# Patient Record
Sex: Male | Born: 1950
Health system: Southern US, Community
[De-identification: ages and names within clinical notes are randomized; demographics above are authoritative.]

## PROBLEM LIST (undated history)

## (undated) DIAGNOSIS — G935 Compression of brain: Secondary | ICD-10-CM

## (undated) DIAGNOSIS — Z973 Presence of spectacles and contact lenses: Secondary | ICD-10-CM

## (undated) DIAGNOSIS — I719 Aortic aneurysm of unspecified site, without rupture: Secondary | ICD-10-CM

## (undated) DIAGNOSIS — E039 Hypothyroidism, unspecified: Secondary | ICD-10-CM

## (undated) DIAGNOSIS — C801 Malignant (primary) neoplasm, unspecified: Secondary | ICD-10-CM

## (undated) DIAGNOSIS — T7840XA Allergy, unspecified, initial encounter: Secondary | ICD-10-CM

## (undated) DIAGNOSIS — G709 Myoneural disorder, unspecified: Secondary | ICD-10-CM

## (undated) DIAGNOSIS — R1310 Dysphagia, unspecified: Secondary | ICD-10-CM

## (undated) DIAGNOSIS — T884XXA Failed or difficult intubation, initial encounter: Secondary | ICD-10-CM

## (undated) DIAGNOSIS — H269 Unspecified cataract: Secondary | ICD-10-CM

## (undated) DIAGNOSIS — I10 Essential (primary) hypertension: Secondary | ICD-10-CM

## (undated) DIAGNOSIS — M1990 Unspecified osteoarthritis, unspecified site: Secondary | ICD-10-CM

## (undated) DIAGNOSIS — E785 Hyperlipidemia, unspecified: Secondary | ICD-10-CM

## (undated) DIAGNOSIS — E079 Disorder of thyroid, unspecified: Secondary | ICD-10-CM

## (undated) DIAGNOSIS — I509 Heart failure, unspecified: Secondary | ICD-10-CM

## (undated) DIAGNOSIS — Z8489 Family history of other specified conditions: Secondary | ICD-10-CM

## (undated) DIAGNOSIS — K219 Gastro-esophageal reflux disease without esophagitis: Secondary | ICD-10-CM

## (undated) DIAGNOSIS — N189 Chronic kidney disease, unspecified: Secondary | ICD-10-CM

## (undated) HISTORY — PX: EYE SURGERY: SHX253

## (undated) HISTORY — DX: Gastro-esophageal reflux disease without esophagitis: K21.9

## (undated) HISTORY — DX: Compression of brain: G93.5

## (undated) HISTORY — PX: OTHER SURGICAL HISTORY: SHX169

## (undated) HISTORY — PX: TOTAL HIP ARTHROPLASTY: SHX124

## (undated) HISTORY — DX: Aortic aneurysm of unspecified site, without rupture: I71.9

## (undated) HISTORY — PX: INGUINAL HERNIA REPAIR: SHX194

## (undated) HISTORY — DX: Essential (primary) hypertension: I10

## (undated) HISTORY — DX: Myoneural disorder, unspecified: G70.9

## (undated) HISTORY — PX: HERNIA REPAIR: SHX51

## (undated) HISTORY — DX: Unspecified cataract: H26.9

## (undated) HISTORY — PX: CATARACT EXTRACTION: SUR2

## (undated) HISTORY — DX: Disorder of thyroid, unspecified: E07.9

## (undated) HISTORY — DX: Hyperlipidemia, unspecified: E78.5

## (undated) HISTORY — PX: SPINE SURGERY: SHX786

## (undated) HISTORY — PX: LUMBAR LAMINECTOMY: SHX95

## (undated) HISTORY — DX: Allergy, unspecified, initial encounter: T78.40XA

---

## 1998-07-27 ENCOUNTER — Ambulatory Visit (HOSPITAL_COMMUNITY): Admission: RE | Admit: 1998-07-27 | Discharge: 1998-07-27 | Payer: Self-pay | Admitting: Internal Medicine

## 1998-07-27 ENCOUNTER — Encounter: Payer: Self-pay | Admitting: Internal Medicine

## 1998-08-18 ENCOUNTER — Ambulatory Visit (HOSPITAL_COMMUNITY): Admission: RE | Admit: 1998-08-18 | Discharge: 1998-08-18 | Payer: Self-pay | Admitting: Cardiovascular Disease

## 2001-07-31 LAB — HM COLONOSCOPY: HM Colonoscopy: NORMAL

## 2002-02-12 ENCOUNTER — Emergency Department (HOSPITAL_COMMUNITY): Admission: EM | Admit: 2002-02-12 | Discharge: 2002-02-12 | Payer: Self-pay | Admitting: Emergency Medicine

## 2002-02-13 ENCOUNTER — Emergency Department (HOSPITAL_COMMUNITY): Admission: EM | Admit: 2002-02-13 | Discharge: 2002-02-13 | Payer: Self-pay | Admitting: Emergency Medicine

## 2002-02-14 ENCOUNTER — Emergency Department (HOSPITAL_COMMUNITY): Admission: EM | Admit: 2002-02-14 | Discharge: 2002-02-14 | Payer: Self-pay | Admitting: Emergency Medicine

## 2005-04-25 ENCOUNTER — Ambulatory Visit: Payer: Self-pay | Admitting: Internal Medicine

## 2005-05-01 ENCOUNTER — Ambulatory Visit: Payer: Self-pay | Admitting: Internal Medicine

## 2007-02-11 ENCOUNTER — Encounter: Payer: Self-pay | Admitting: Internal Medicine

## 2007-02-11 DIAGNOSIS — Z87448 Personal history of other diseases of urinary system: Secondary | ICD-10-CM | POA: Insufficient documentation

## 2007-02-11 DIAGNOSIS — L509 Urticaria, unspecified: Secondary | ICD-10-CM | POA: Insufficient documentation

## 2007-02-11 DIAGNOSIS — K219 Gastro-esophageal reflux disease without esophagitis: Secondary | ICD-10-CM | POA: Insufficient documentation

## 2007-12-03 ENCOUNTER — Ambulatory Visit: Payer: Self-pay | Admitting: Internal Medicine

## 2007-12-03 LAB — CONVERTED CEMR LAB
ALT: 26 units/L (ref 0–53)
Albumin: 4 g/dL (ref 3.5–5.2)
BUN: 30 mg/dL — ABNORMAL HIGH (ref 6–23)
Basophils Absolute: 0.1 10*3/uL (ref 0.0–0.1)
Basophils Relative: 1.2 % (ref 0.0–3.0)
Bilirubin, Direct: 0.1 mg/dL (ref 0.0–0.3)
Calcium: 9.1 mg/dL (ref 8.4–10.5)
Chloride: 109 meq/L (ref 96–112)
Direct LDL: 147.7 mg/dL
Eosinophils Relative: 1.1 % (ref 0.0–5.0)
GFR calc non Af Amer: 107 mL/min
HCT: 45.2 % (ref 39.0–52.0)
Hemoglobin, Urine: NEGATIVE
Hemoglobin: 15.9 g/dL (ref 13.0–17.0)
Ketones, ur: NEGATIVE mg/dL
Lymphocytes Relative: 30.9 % (ref 12.0–46.0)
MCV: 93.5 fL (ref 78.0–100.0)
Monocytes Relative: 9.3 % (ref 3.0–12.0)
Neutrophils Relative %: 57.5 % (ref 43.0–77.0)
Nitrite: NEGATIVE
Platelets: 245 10*3/uL (ref 150–400)
Potassium: 4 meq/L (ref 3.5–5.1)
RBC: 4.84 M/uL (ref 4.22–5.81)
RDW: 13.1 % (ref 11.5–14.6)
TSH: 1.69 microintl units/mL (ref 0.35–5.50)
Total Bilirubin: 0.7 mg/dL (ref 0.3–1.2)
Total CHOL/HDL Ratio: 5.5
Total Protein, Urine: NEGATIVE mg/dL
Total Protein: 6.9 g/dL (ref 6.0–8.3)
Triglycerides: 84 mg/dL (ref 0–149)
Urine Glucose: NEGATIVE mg/dL
Urobilinogen, UA: 0.2 (ref 0.0–1.0)
VLDL: 17 mg/dL (ref 0–40)

## 2007-12-06 ENCOUNTER — Ambulatory Visit: Payer: Self-pay | Admitting: Internal Medicine

## 2008-02-21 ENCOUNTER — Encounter: Payer: Self-pay | Admitting: Internal Medicine

## 2008-05-21 ENCOUNTER — Ambulatory Visit: Payer: Self-pay | Admitting: Internal Medicine

## 2008-05-21 DIAGNOSIS — R109 Unspecified abdominal pain: Secondary | ICD-10-CM | POA: Insufficient documentation

## 2008-05-22 ENCOUNTER — Telehealth: Payer: Self-pay | Admitting: Internal Medicine

## 2008-06-15 ENCOUNTER — Telehealth: Payer: Self-pay | Admitting: Internal Medicine

## 2008-06-17 ENCOUNTER — Telehealth: Payer: Self-pay | Admitting: Internal Medicine

## 2008-06-25 ENCOUNTER — Encounter: Admission: RE | Admit: 2008-06-25 | Discharge: 2008-06-25 | Payer: Self-pay | Admitting: Internal Medicine

## 2008-06-29 ENCOUNTER — Telehealth: Payer: Self-pay | Admitting: Internal Medicine

## 2008-06-30 ENCOUNTER — Ambulatory Visit: Payer: Self-pay | Admitting: Internal Medicine

## 2008-06-30 DIAGNOSIS — M87059 Idiopathic aseptic necrosis of unspecified femur: Secondary | ICD-10-CM | POA: Insufficient documentation

## 2008-07-14 ENCOUNTER — Telehealth: Payer: Self-pay | Admitting: Internal Medicine

## 2008-07-27 ENCOUNTER — Telehealth: Payer: Self-pay | Admitting: Internal Medicine

## 2008-07-30 ENCOUNTER — Encounter: Admission: RE | Admit: 2008-07-30 | Discharge: 2008-07-30 | Payer: Self-pay | Admitting: Orthopedic Surgery

## 2008-09-01 ENCOUNTER — Inpatient Hospital Stay (HOSPITAL_COMMUNITY): Admission: RE | Admit: 2008-09-01 | Discharge: 2008-09-03 | Payer: Self-pay | Admitting: Orthopedic Surgery

## 2009-03-01 ENCOUNTER — Encounter: Admission: RE | Admit: 2009-03-01 | Discharge: 2009-03-01 | Payer: Self-pay | Admitting: Oral Surgery

## 2009-03-09 ENCOUNTER — Other Ambulatory Visit: Admission: RE | Admit: 2009-03-09 | Discharge: 2009-03-09 | Payer: Self-pay | Admitting: Otolaryngology

## 2009-04-07 ENCOUNTER — Encounter: Admission: RE | Admit: 2009-04-07 | Discharge: 2009-04-07 | Payer: Self-pay | Admitting: Otolaryngology

## 2009-04-07 ENCOUNTER — Ambulatory Visit: Payer: Self-pay | Admitting: Oncology

## 2009-04-12 ENCOUNTER — Ambulatory Visit: Admission: RE | Admit: 2009-04-12 | Discharge: 2009-07-09 | Payer: Self-pay | Admitting: Radiation Oncology

## 2009-04-12 ENCOUNTER — Ambulatory Visit (HOSPITAL_COMMUNITY): Admission: RE | Admit: 2009-04-12 | Discharge: 2009-04-12 | Payer: Self-pay | Admitting: Otolaryngology

## 2009-04-13 ENCOUNTER — Ambulatory Visit: Payer: Self-pay | Admitting: Internal Medicine

## 2009-04-13 LAB — COMPREHENSIVE METABOLIC PANEL
ALT: 10 U/L (ref 0–53)
Albumin: 3.6 g/dL (ref 3.5–5.2)
Alkaline Phosphatase: 111 U/L (ref 39–117)
BUN: 19 mg/dL (ref 6–23)
CO2: 23 mEq/L (ref 19–32)
Glucose, Bld: 100 mg/dL — ABNORMAL HIGH (ref 70–99)
Potassium: 3.8 mEq/L (ref 3.5–5.3)

## 2009-04-13 LAB — CBC WITH DIFFERENTIAL/PLATELET
BASO%: 0.3 % (ref 0.0–2.0)
EOS%: 0.3 % (ref 0.0–7.0)
Eosinophils Absolute: 0 10*3/uL (ref 0.0–0.5)
HGB: 12.5 g/dL — ABNORMAL LOW (ref 13.0–17.1)
LYMPH%: 13.5 % — ABNORMAL LOW (ref 14.0–49.0)
MCV: 86.9 fL (ref 79.3–98.0)
MONO%: 5.6 % (ref 0.0–14.0)
NEUT#: 10.1 10*3/uL — ABNORMAL HIGH (ref 1.5–6.5)
RDW: 14.2 % (ref 11.0–14.6)
WBC: 12.6 10*3/uL — ABNORMAL HIGH (ref 4.0–10.3)
lymph#: 1.7 10*3/uL (ref 0.9–3.3)

## 2009-04-15 ENCOUNTER — Encounter: Admission: AD | Admit: 2009-04-15 | Discharge: 2009-04-15 | Payer: Self-pay | Admitting: Dentistry

## 2009-04-15 ENCOUNTER — Ambulatory Visit: Payer: Self-pay | Admitting: Dentistry

## 2009-04-21 ENCOUNTER — Encounter: Admission: RE | Admit: 2009-04-21 | Discharge: 2009-07-20 | Payer: Self-pay | Admitting: Radiation Oncology

## 2009-04-30 ENCOUNTER — Ambulatory Visit (HOSPITAL_COMMUNITY): Admission: RE | Admit: 2009-04-30 | Discharge: 2009-04-30 | Payer: Self-pay | Admitting: Radiation Oncology

## 2009-05-04 ENCOUNTER — Encounter: Payer: Self-pay | Admitting: Internal Medicine

## 2009-05-04 LAB — COMPREHENSIVE METABOLIC PANEL
Albumin: 2.7 g/dL — ABNORMAL LOW (ref 3.5–5.2)
Alkaline Phosphatase: 86 U/L (ref 39–117)
BUN: 16 mg/dL (ref 6–23)
CO2: 25 mEq/L (ref 19–32)
Calcium: 9.2 mg/dL (ref 8.4–10.5)
Chloride: 101 mEq/L (ref 96–112)
Glucose, Bld: 107 mg/dL — ABNORMAL HIGH (ref 70–99)
Potassium: 4 mEq/L (ref 3.5–5.3)
Sodium: 135 mEq/L (ref 135–145)
Total Protein: 7.2 g/dL (ref 6.0–8.3)

## 2009-05-04 LAB — CBC WITH DIFFERENTIAL/PLATELET
Basophils Absolute: 0 10*3/uL (ref 0.0–0.1)
Eosinophils Absolute: 0.1 10*3/uL (ref 0.0–0.5)
HCT: 32.2 % — ABNORMAL LOW (ref 38.4–49.9)
HGB: 10.8 g/dL — ABNORMAL LOW (ref 13.0–17.1)
MCH: 28 pg (ref 27.2–33.4)
MONO#: 1.4 10*3/uL — ABNORMAL HIGH (ref 0.1–0.9)
NEUT#: 14.2 10*3/uL — ABNORMAL HIGH (ref 1.5–6.5)
NEUT%: 79.6 % — ABNORMAL HIGH (ref 39.0–75.0)
RDW: 14.3 % (ref 11.0–14.6)
lymph#: 2.1 10*3/uL (ref 0.9–3.3)

## 2009-05-06 ENCOUNTER — Emergency Department (HOSPITAL_COMMUNITY): Admission: EM | Admit: 2009-05-06 | Discharge: 2009-05-06 | Payer: Self-pay | Admitting: Emergency Medicine

## 2009-05-07 ENCOUNTER — Ambulatory Visit: Payer: Self-pay | Admitting: Oncology

## 2009-05-11 ENCOUNTER — Ambulatory Visit: Payer: Self-pay | Admitting: Oncology

## 2009-05-11 ENCOUNTER — Inpatient Hospital Stay (HOSPITAL_COMMUNITY): Admission: AD | Admit: 2009-05-11 | Discharge: 2009-05-17 | Payer: Self-pay | Admitting: Oncology

## 2009-05-11 ENCOUNTER — Encounter: Payer: Self-pay | Admitting: Internal Medicine

## 2009-05-11 LAB — CBC WITH DIFFERENTIAL/PLATELET
Basophils Absolute: 0.1 10*3/uL (ref 0.0–0.1)
HCT: 38.1 % — ABNORMAL LOW (ref 38.4–49.9)
HGB: 13.6 g/dL (ref 13.0–17.1)
LYMPH%: 3.9 % — ABNORMAL LOW (ref 14.0–49.0)
MCH: 27.9 pg (ref 27.2–33.4)
MONO#: 0.9 10*3/uL (ref 0.1–0.9)
NEUT%: 91.5 % — ABNORMAL HIGH (ref 39.0–75.0)
Platelets: 583 10*3/uL — ABNORMAL HIGH (ref 140–400)
WBC: 20.7 10*3/uL — ABNORMAL HIGH (ref 4.0–10.3)
lymph#: 0.8 10*3/uL — ABNORMAL LOW (ref 0.9–3.3)

## 2009-05-11 LAB — COMPREHENSIVE METABOLIC PANEL
ALT: 30 U/L (ref 0–53)
AST: 24 U/L (ref 0–37)
Alkaline Phosphatase: 91 U/L (ref 39–117)
CO2: 26 mEq/L (ref 19–32)
Potassium: 3 mEq/L — ABNORMAL LOW (ref 3.5–5.3)
Total Bilirubin: 0.7 mg/dL (ref 0.3–1.2)
Total Protein: 7.6 g/dL (ref 6.0–8.3)

## 2009-05-15 ENCOUNTER — Ambulatory Visit: Payer: Self-pay | Admitting: Hematology & Oncology

## 2009-05-16 ENCOUNTER — Ambulatory Visit: Payer: Self-pay | Admitting: Internal Medicine

## 2009-05-19 LAB — CBC WITH DIFFERENTIAL/PLATELET
BASO%: 1.2 % (ref 0.0–2.0)
HCT: 27 % — ABNORMAL LOW (ref 38.4–49.9)
LYMPH%: 14.5 % (ref 14.0–49.0)
MCHC: 34.1 g/dL (ref 32.0–36.0)
MCV: 84.9 fL (ref 79.3–98.0)
MONO%: 6.9 % (ref 0.0–14.0)
NEUT%: 75.1 % — ABNORMAL HIGH (ref 39.0–75.0)
Platelets: 278 10*3/uL (ref 140–400)
RBC: 3.18 10*6/uL — ABNORMAL LOW (ref 4.20–5.82)

## 2009-05-19 LAB — COMPREHENSIVE METABOLIC PANEL
ALT: 14 U/L (ref 0–53)
Alkaline Phosphatase: 70 U/L (ref 39–117)
Creatinine, Ser: 2.57 mg/dL — ABNORMAL HIGH (ref 0.40–1.50)
Glucose, Bld: 76 mg/dL (ref 70–99)
Sodium: 139 mEq/L (ref 135–145)
Total Bilirubin: 0.5 mg/dL (ref 0.3–1.2)
Total Protein: 6.4 g/dL (ref 6.0–8.3)

## 2009-05-19 LAB — URIC ACID: Uric Acid, Serum: 5 mg/dL (ref 4.0–7.8)

## 2009-05-26 ENCOUNTER — Encounter: Payer: Self-pay | Admitting: Internal Medicine

## 2009-05-26 LAB — COMPREHENSIVE METABOLIC PANEL
Albumin: 3.4 g/dL — ABNORMAL LOW (ref 3.5–5.2)
Alkaline Phosphatase: 97 U/L (ref 39–117)
BUN: 24 mg/dL — ABNORMAL HIGH (ref 6–23)
CO2: 26 mEq/L (ref 19–32)
Calcium: 9.7 mg/dL (ref 8.4–10.5)
Chloride: 101 mEq/L (ref 96–112)
Glucose, Bld: 89 mg/dL (ref 70–99)
Potassium: 5.2 mEq/L (ref 3.5–5.3)
Sodium: 136 mEq/L (ref 135–145)
Total Protein: 7.7 g/dL (ref 6.0–8.3)

## 2009-05-26 LAB — CBC WITH DIFFERENTIAL/PLATELET
Basophils Absolute: 0 10*3/uL (ref 0.0–0.1)
EOS%: 7.4 % — ABNORMAL HIGH (ref 0.0–7.0)
Eosinophils Absolute: 0.2 10*3/uL (ref 0.0–0.5)
HCT: 33 % — ABNORMAL LOW (ref 38.4–49.9)
HGB: 10.9 g/dL — ABNORMAL LOW (ref 13.0–17.1)
MCH: 27.5 pg (ref 27.2–33.4)
NEUT#: 1.9 10*3/uL (ref 1.5–6.5)
NEUT%: 60.9 % (ref 39.0–75.0)
RDW: 14.6 % (ref 11.0–14.6)
lymph#: 0.7 10*3/uL — ABNORMAL LOW (ref 0.9–3.3)

## 2009-05-31 LAB — COMPREHENSIVE METABOLIC PANEL
Albumin: 4 g/dL (ref 3.5–5.2)
Alkaline Phosphatase: 84 U/L (ref 39–117)
BUN: 26 mg/dL — ABNORMAL HIGH (ref 6–23)
Glucose, Bld: 97 mg/dL (ref 70–99)
Potassium: 4 mEq/L (ref 3.5–5.3)
Total Bilirubin: 0.3 mg/dL (ref 0.3–1.2)

## 2009-05-31 LAB — CBC WITH DIFFERENTIAL/PLATELET
Basophils Absolute: 0 10*3/uL (ref 0.0–0.1)
Eosinophils Absolute: 0.2 10*3/uL (ref 0.0–0.5)
HGB: 10.6 g/dL — ABNORMAL LOW (ref 13.0–17.1)
LYMPH%: 10.7 % — ABNORMAL LOW (ref 14.0–49.0)
MCV: 83.5 fL (ref 79.3–98.0)
MONO%: 9.7 % (ref 0.0–14.0)
NEUT#: 2.5 10*3/uL (ref 1.5–6.5)
Platelets: 385 10*3/uL (ref 140–400)

## 2009-05-31 LAB — MAGNESIUM: Magnesium: 1.3 mg/dL — ABNORMAL LOW (ref 1.5–2.5)

## 2009-06-02 ENCOUNTER — Encounter: Payer: Self-pay | Admitting: Internal Medicine

## 2009-06-02 LAB — BASIC METABOLIC PANEL
Glucose, Bld: 85 mg/dL (ref 70–99)
Potassium: 4.4 mEq/L (ref 3.5–5.3)
Sodium: 134 mEq/L — ABNORMAL LOW (ref 135–145)

## 2009-06-02 LAB — MAGNESIUM: Magnesium: 1.4 mg/dL — ABNORMAL LOW (ref 1.5–2.5)

## 2009-06-09 ENCOUNTER — Encounter: Payer: Self-pay | Admitting: Internal Medicine

## 2009-06-09 ENCOUNTER — Ambulatory Visit: Payer: Self-pay | Admitting: Oncology

## 2009-06-09 LAB — CBC WITH DIFFERENTIAL/PLATELET
Basophils Absolute: 0 10*3/uL (ref 0.0–0.1)
Eosinophils Absolute: 0 10*3/uL (ref 0.0–0.5)
HGB: 9.6 g/dL — ABNORMAL LOW (ref 13.0–17.1)
NEUT#: 2.9 10*3/uL (ref 1.5–6.5)
RDW: 15.4 % — ABNORMAL HIGH (ref 11.0–14.6)
lymph#: 0.3 10*3/uL — ABNORMAL LOW (ref 0.9–3.3)

## 2009-06-09 LAB — COMPREHENSIVE METABOLIC PANEL
Albumin: 4 g/dL (ref 3.5–5.2)
BUN: 26 mg/dL — ABNORMAL HIGH (ref 6–23)
Calcium: 9.5 mg/dL (ref 8.4–10.5)
Chloride: 92 mEq/L — ABNORMAL LOW (ref 96–112)
Glucose, Bld: 85 mg/dL (ref 70–99)
Potassium: 4.6 mEq/L (ref 3.5–5.3)

## 2009-06-16 ENCOUNTER — Encounter: Payer: Self-pay | Admitting: Internal Medicine

## 2009-06-16 LAB — CBC WITH DIFFERENTIAL/PLATELET
BASO%: 0.4 % (ref 0.0–2.0)
Eosinophils Absolute: 0.1 10*3/uL (ref 0.0–0.5)
MCV: 84.3 fL (ref 79.3–98.0)
MONO#: 0.8 10*3/uL (ref 0.1–0.9)
MONO%: 18.8 % — ABNORMAL HIGH (ref 0.0–14.0)
NEUT#: 3.1 10*3/uL (ref 1.5–6.5)
RBC: 2.92 10*6/uL — ABNORMAL LOW (ref 4.20–5.82)
RDW: 17.2 % — ABNORMAL HIGH (ref 11.0–14.6)
WBC: 4.2 10*3/uL (ref 4.0–10.3)

## 2009-06-16 LAB — COMPREHENSIVE METABOLIC PANEL
ALT: 21 U/L (ref 0–53)
AST: 20 U/L (ref 0–37)
Albumin: 2.9 g/dL — ABNORMAL LOW (ref 3.5–5.2)
Alkaline Phosphatase: 97 U/L (ref 39–117)
Glucose, Bld: 81 mg/dL (ref 70–99)
Potassium: 4 mEq/L (ref 3.5–5.3)
Sodium: 136 mEq/L (ref 135–145)
Total Protein: 6.2 g/dL (ref 6.0–8.3)

## 2009-06-22 LAB — COMPREHENSIVE METABOLIC PANEL
ALT: 25 U/L (ref 0–53)
AST: 19 U/L (ref 0–37)
BUN: 22 mg/dL (ref 6–23)
Calcium: 8.9 mg/dL (ref 8.4–10.5)
Chloride: 95 mEq/L — ABNORMAL LOW (ref 96–112)
Creatinine, Ser: 0.76 mg/dL (ref 0.40–1.50)
Total Bilirubin: 0.3 mg/dL (ref 0.3–1.2)

## 2009-06-22 LAB — CBC WITH DIFFERENTIAL/PLATELET
BASO%: 1.2 % (ref 0.0–2.0)
Basophils Absolute: 0 10*3/uL (ref 0.0–0.1)
EOS%: 3 % (ref 0.0–7.0)
HCT: 23.8 % — ABNORMAL LOW (ref 38.4–49.9)
HGB: 8.4 g/dL — ABNORMAL LOW (ref 13.0–17.1)
LYMPH%: 7.8 % — ABNORMAL LOW (ref 14.0–49.0)
MCH: 30.6 pg (ref 27.2–33.4)
MCHC: 35.5 g/dL (ref 32.0–36.0)
MCV: 86.2 fL (ref 79.3–98.0)
NEUT%: 77.5 % — ABNORMAL HIGH (ref 39.0–75.0)
Platelets: 265 10*3/uL (ref 140–400)
lymph#: 0.2 10*3/uL — ABNORMAL LOW (ref 0.9–3.3)

## 2009-06-23 ENCOUNTER — Encounter: Payer: Self-pay | Admitting: Internal Medicine

## 2009-07-19 ENCOUNTER — Ambulatory Visit: Payer: Self-pay | Admitting: Oncology

## 2009-07-19 ENCOUNTER — Ambulatory Visit (HOSPITAL_COMMUNITY): Admission: RE | Admit: 2009-07-19 | Discharge: 2009-07-19 | Payer: Self-pay | Admitting: Oncology

## 2009-07-21 ENCOUNTER — Encounter: Payer: Self-pay | Admitting: Internal Medicine

## 2009-07-21 LAB — COMPREHENSIVE METABOLIC PANEL
ALT: 19 U/L (ref 0–53)
AST: 18 U/L (ref 0–37)
Albumin: 3.8 g/dL (ref 3.5–5.2)
Alkaline Phosphatase: 101 U/L (ref 39–117)
BUN: 23 mg/dL (ref 6–23)
CO2: 28 mEq/L (ref 19–32)
Calcium: 9.4 mg/dL (ref 8.4–10.5)
Chloride: 97 mEq/L (ref 96–112)
Creatinine, Ser: 0.83 mg/dL (ref 0.40–1.50)
Glucose, Bld: 103 mg/dL — ABNORMAL HIGH (ref 70–99)
Potassium: 4.1 mEq/L (ref 3.5–5.3)
Sodium: 143 mEq/L (ref 135–145)
Total Bilirubin: 0.3 mg/dL (ref 0.3–1.2)
Total Protein: 6.6 g/dL (ref 6.0–8.3)

## 2009-07-21 LAB — CBC WITH DIFFERENTIAL/PLATELET
BASO%: 0.3 % (ref 0.0–2.0)
Basophils Absolute: 0 10*3/uL (ref 0.0–0.1)
EOS%: 1.1 % (ref 0.0–7.0)
Eosinophils Absolute: 0.1 10*3/uL (ref 0.0–0.5)
HCT: 31.2 % — ABNORMAL LOW (ref 38.4–49.9)
HGB: 10.6 g/dL — ABNORMAL LOW (ref 13.0–17.1)
LYMPH%: 13.5 % — ABNORMAL LOW (ref 14.0–49.0)
MCH: 31.7 pg (ref 27.2–33.4)
MCHC: 34.1 g/dL (ref 32.0–36.0)
MCV: 93 fL (ref 79.3–98.0)
MONO#: 0.6 10*3/uL (ref 0.1–0.9)
MONO%: 13.2 % (ref 0.0–14.0)
NEUT#: 3.4 10*3/uL (ref 1.5–6.5)
NEUT%: 71.9 % (ref 39.0–75.0)
Platelets: 380 10*3/uL (ref 140–400)
RBC: 3.35 10*6/uL — ABNORMAL LOW (ref 4.20–5.82)
RDW: 24.1 % — ABNORMAL HIGH (ref 11.0–14.6)
WBC: 4.7 10*3/uL (ref 4.0–10.3)
lymph#: 0.6 10*3/uL — ABNORMAL LOW (ref 0.9–3.3)

## 2009-08-03 ENCOUNTER — Encounter: Payer: Self-pay | Admitting: Internal Medicine

## 2009-08-04 ENCOUNTER — Ambulatory Visit: Payer: Self-pay | Admitting: Dentistry

## 2009-08-17 ENCOUNTER — Telehealth: Payer: Self-pay | Admitting: Internal Medicine

## 2009-08-17 LAB — BASIC METABOLIC PANEL
CO2: 25 mEq/L (ref 19–32)
Calcium: 9.7 mg/dL (ref 8.4–10.5)
Chloride: 101 mEq/L (ref 96–112)
Potassium: 3.8 mEq/L (ref 3.5–5.3)
Sodium: 140 mEq/L (ref 135–145)

## 2009-09-10 ENCOUNTER — Ambulatory Visit: Payer: Self-pay | Admitting: Oncology

## 2009-09-14 LAB — CBC WITH DIFFERENTIAL/PLATELET
Basophils Absolute: 0 10*3/uL (ref 0.0–0.1)
HCT: 36.5 % — ABNORMAL LOW (ref 38.4–49.9)
HGB: 12.7 g/dL — ABNORMAL LOW (ref 13.0–17.1)
LYMPH%: 15.7 % (ref 14.0–49.0)
MONO#: 0.4 10*3/uL (ref 0.1–0.9)
NEUT%: 73.2 % (ref 39.0–75.0)
Platelets: 264 10*3/uL (ref 140–400)
WBC: 5.2 10*3/uL (ref 4.0–10.3)
lymph#: 0.8 10*3/uL — ABNORMAL LOW (ref 0.9–3.3)

## 2009-09-14 LAB — COMPREHENSIVE METABOLIC PANEL
BUN: 23 mg/dL (ref 6–23)
CO2: 26 mEq/L (ref 19–32)
Calcium: 9.7 mg/dL (ref 8.4–10.5)
Chloride: 103 mEq/L (ref 96–112)
Creatinine, Ser: 0.73 mg/dL (ref 0.40–1.50)
Glucose, Bld: 99 mg/dL (ref 70–99)

## 2009-09-15 ENCOUNTER — Ambulatory Visit (HOSPITAL_COMMUNITY): Admission: RE | Admit: 2009-09-15 | Discharge: 2009-09-15 | Payer: Self-pay | Admitting: Oncology

## 2009-09-17 ENCOUNTER — Encounter: Payer: Self-pay | Admitting: Internal Medicine

## 2009-09-30 ENCOUNTER — Ambulatory Visit (HOSPITAL_COMMUNITY): Admission: RE | Admit: 2009-09-30 | Discharge: 2009-09-30 | Payer: Self-pay | Admitting: Oncology

## 2009-10-07 ENCOUNTER — Encounter: Payer: Self-pay | Admitting: Internal Medicine

## 2009-10-12 ENCOUNTER — Encounter: Admission: RE | Admit: 2009-10-12 | Discharge: 2009-10-12 | Payer: Self-pay | Admitting: Interventional Radiology

## 2009-10-29 ENCOUNTER — Ambulatory Visit (HOSPITAL_COMMUNITY): Admission: RE | Admit: 2009-10-29 | Discharge: 2009-10-29 | Payer: Self-pay | Admitting: Interventional Radiology

## 2009-11-05 ENCOUNTER — Ambulatory Visit (HOSPITAL_COMMUNITY): Admission: RE | Admit: 2009-11-05 | Discharge: 2009-11-05 | Payer: Self-pay | Admitting: Interventional Radiology

## 2009-12-14 ENCOUNTER — Encounter: Payer: Self-pay | Admitting: Diagnostic Radiology

## 2009-12-16 ENCOUNTER — Encounter: Payer: Self-pay | Admitting: Interventional Radiology

## 2010-01-13 ENCOUNTER — Ambulatory Visit (HOSPITAL_COMMUNITY): Admission: RE | Admit: 2010-01-13 | Discharge: 2010-01-13 | Payer: Self-pay | Admitting: Oncology

## 2010-01-13 ENCOUNTER — Ambulatory Visit: Payer: Self-pay | Admitting: Oncology

## 2010-01-14 ENCOUNTER — Encounter: Payer: Self-pay | Admitting: Internal Medicine

## 2010-01-14 LAB — CBC WITH DIFFERENTIAL/PLATELET
Basophils Absolute: 0 10*3/uL (ref 0.0–0.1)
Eosinophils Absolute: 0.2 10*3/uL (ref 0.0–0.5)
HGB: 14.1 g/dL (ref 13.0–17.1)
MCV: 89.9 fL (ref 79.3–98.0)
MONO%: 9.1 % (ref 0.0–14.0)
NEUT#: 2.9 10*3/uL (ref 1.5–6.5)
RBC: 4.55 10*6/uL (ref 4.20–5.82)
RDW: 14.9 % — ABNORMAL HIGH (ref 11.0–14.6)
WBC: 4.4 10*3/uL (ref 4.0–10.3)
lymph#: 1 10*3/uL (ref 0.9–3.3)
nRBC: 0 % (ref 0–0)

## 2010-02-09 ENCOUNTER — Encounter: Payer: Self-pay | Admitting: Interventional Radiology

## 2010-04-07 ENCOUNTER — Encounter: Payer: Self-pay | Admitting: Internal Medicine

## 2010-04-07 ENCOUNTER — Ambulatory Visit
Admission: RE | Admit: 2010-04-07 | Discharge: 2010-04-07 | Payer: Self-pay | Source: Home / Self Care | Attending: Internal Medicine | Admitting: Internal Medicine

## 2010-04-07 LAB — CONVERTED CEMR LAB
ALT: 15 units/L (ref 0–53)
AST: 26 units/L (ref 0–37)
Albumin: 3.7 g/dL (ref 3.5–5.2)
Alkaline Phosphatase: 71 units/L (ref 39–117)
Basophils Relative: 0.6 % (ref 0.0–3.0)
Bilirubin Urine: NEGATIVE
CO2: 27 meq/L (ref 19–32)
Calcium: 9.2 mg/dL (ref 8.4–10.5)
GFR calc non Af Amer: 82.52 mL/min (ref >60.00)
HCT: 44.3 % (ref 39.0–52.0)
Hemoglobin: 15.1 g/dL (ref 13.0–17.0)
Leukocytes, UA: NEGATIVE
Lymphocytes Relative: 22 % (ref 12.0–46.0)
Lymphs Abs: 1 10*3/uL (ref 0.7–4.0)
Monocytes Relative: 10.9 % (ref 3.0–12.0)
Neutro Abs: 2.8 10*3/uL (ref 1.4–7.7)
Nitrite: NEGATIVE
PSA: 0.61 ng/mL (ref 0.10–4.00)
Potassium: 3.9 meq/L (ref 3.5–5.1)
RBC: 4.85 M/uL (ref 4.22–5.81)
Sodium: 136 meq/L (ref 135–145)
Specific Gravity, Urine: 1.025 (ref 1.000–1.030)
Total CHOL/HDL Ratio: 4
Total Protein: 6.7 g/dL (ref 6.0–8.3)
pH: 5 (ref 5.0–8.0)

## 2010-04-28 ENCOUNTER — Ambulatory Visit
Admission: RE | Admit: 2010-04-28 | Discharge: 2010-04-28 | Payer: Self-pay | Source: Home / Self Care | Attending: Internal Medicine | Admitting: Internal Medicine

## 2010-04-28 DIAGNOSIS — C801 Malignant (primary) neoplasm, unspecified: Secondary | ICD-10-CM | POA: Insufficient documentation

## 2010-04-28 DIAGNOSIS — N529 Male erectile dysfunction, unspecified: Secondary | ICD-10-CM | POA: Insufficient documentation

## 2010-04-29 ENCOUNTER — Emergency Department (HOSPITAL_COMMUNITY)
Admission: EM | Admit: 2010-04-29 | Discharge: 2010-04-29 | Payer: Self-pay | Source: Home / Self Care | Admitting: Emergency Medicine

## 2010-04-29 ENCOUNTER — Other Ambulatory Visit: Payer: Self-pay | Admitting: Oncology

## 2010-04-29 DIAGNOSIS — F17200 Nicotine dependence, unspecified, uncomplicated: Secondary | ICD-10-CM | POA: Insufficient documentation

## 2010-04-29 DIAGNOSIS — C069 Malignant neoplasm of mouth, unspecified: Secondary | ICD-10-CM

## 2010-04-30 ENCOUNTER — Other Ambulatory Visit: Payer: Self-pay | Admitting: Oncology

## 2010-04-30 DIAGNOSIS — C099 Malignant neoplasm of tonsil, unspecified: Secondary | ICD-10-CM

## 2010-05-01 ENCOUNTER — Encounter: Payer: Self-pay | Admitting: Interventional Radiology

## 2010-05-02 ENCOUNTER — Telehealth: Payer: Self-pay | Admitting: Internal Medicine

## 2010-05-02 LAB — COMPREHENSIVE METABOLIC PANEL
ALT: 15 U/L (ref 0–53)
AST: 26 U/L (ref 0–37)
Albumin: 3.8 g/dL (ref 3.5–5.2)
BUN: 18 mg/dL (ref 6–23)
Chloride: 100 mEq/L (ref 96–112)
Glucose, Bld: 94 mg/dL (ref 70–99)
Potassium: 3.5 mEq/L (ref 3.5–5.1)

## 2010-05-02 LAB — DIFFERENTIAL
Eosinophils Absolute: 0.1 10*3/uL (ref 0.0–0.7)
Eosinophils Relative: 2 % (ref 0–5)
Lymphocytes Relative: 21 % (ref 12–46)
Monocytes Absolute: 0.5 10*3/uL (ref 0.1–1.0)
Monocytes Relative: 9 % (ref 3–12)
Neutro Abs: 3.4 10*3/uL (ref 1.7–7.7)
Neutrophils Relative %: 68 % (ref 43–77)

## 2010-05-02 LAB — CBC
HCT: 43.1 % (ref 39.0–52.0)
Platelets: 267 10*3/uL (ref 150–400)
RDW: 14 % (ref 11.5–15.5)

## 2010-05-02 LAB — SURGICAL PCR SCREEN
MRSA, PCR: NEGATIVE
Staphylococcus aureus: NEGATIVE

## 2010-05-03 ENCOUNTER — Encounter (INDEPENDENT_AMBULATORY_CARE_PROVIDER_SITE_OTHER): Payer: Self-pay | Admitting: General Surgery

## 2010-05-03 ENCOUNTER — Ambulatory Visit (HOSPITAL_COMMUNITY)
Admission: EM | Admit: 2010-05-03 | Discharge: 2010-05-05 | Payer: Self-pay | Source: Home / Self Care | Attending: General Surgery | Admitting: General Surgery

## 2010-05-04 LAB — CBC
Hemoglobin: 13.2 g/dL (ref 13.0–17.0)
MCH: 31 pg (ref 26.0–34.0)
MCHC: 35 g/dL (ref 30.0–36.0)
Platelets: 282 10*3/uL (ref 150–400)
RBC: 4.26 MIL/uL (ref 4.22–5.81)
WBC: 7.6 10*3/uL (ref 4.0–10.5)

## 2010-05-04 LAB — BASIC METABOLIC PANEL
CO2: 27 mEq/L (ref 19–32)
Chloride: 105 mEq/L (ref 96–112)
Creatinine, Ser: 1.02 mg/dL (ref 0.4–1.5)
Sodium: 140 mEq/L (ref 135–145)

## 2010-05-07 NOTE — Op Note (Signed)
NAME:  Brandon [name], Brandon [name]                ACCOUNT NO.:  [id number]  MEDICAL RECORD NO.:  [id number]          PATIENT TYPE:  AMB  LOCATION:  DAY                          FACILITY:  [facility]  PHYSICIAN:  Anselm Pancoast. 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  OPERATION:  Closure or removal of gastrocutaneous fistula.  ANESTHESIA:  General anesthesia, open procedure.  HISTORY:  Primo Innis is a 60 year old male who I saw probably 3 or 4 months ago at which time he was about 8 months following a placement and then removal of a feeding tube while he was undergoing cancer treatment of the tonsil with submandibular lymph nodes last summer.  He appears to be doing fine from the cancer standpoint, but the patient had had a PET scan and you could see on the PET scan that there is a clip and that one of the T-bar slings for placement of the G tube and the G tube site which was cauterized 3 or 4 times before I saw him, would appear to close up but then open up again with a kind of gastric contents.  He has had no evidence of any nausea and vomiting or problems with eating.  His throat and swallowing and his jaw are stiff from the radiation chemotherapy.  I recommended that we not be quick into proceeding with surgical repair of this since most the time they do heal, but his would a kind of look like it was getting better, you would cauterize the granulation tissue and then it would reoccur with a kind of a little drainage and we picked a date that if it was not closed by this time we go ahead and surgically correct it.  He has seen Dr. Debby Bud, his regular physician and everything was satisfactory so he is here for this planned procedure.  His lab  studies show a hematocrit of 43 Nimmons count 5000 and his MRSA tests were negative and his electrolytes and BUN and creatinine were all normal.  The patient had an accident over the weekend and he injured the left hand with a nail gun and was seen in the ER, they removed the nail from his 3 fingers and placed him on Keflex, that is doing satisfactory and I think we can proceed on with surgery.  DESCRIPTION OF PROCEDURE:  Preoperatively the patient was taken back to the operative suite.  Dr. Jean Rosenthal was the anesthesiologist and he did use a guide scope and placed an endotracheal tube, and he still got a lot of edema in the area where he has been radiated, but no evidence any mass lesions, etc.  The endotracheal tube in and then an oral tube was placed in the stomach and I shaved the hair around the little gastrocutaneous fistula and then prepped him with Betadine solution.  He was given Zosyn or Unasyn preoperatively, and I did not put a Foley in  him.  The abdomen after draped in a sterile manner, the time-out completed.  I made a little transverse incision and a kind of circularly removed the cutaneous area in the tract and ran right down to the muscle layer and then you could see whether it was peritoneum or actually pulling up the stomach.  You could not really tell, but you could put a hemostat down below the fascia and then I made the little incision both medial and lateral transversely.  I could then pull up this area and I was at the point that I thought I was going to be closing the little opening when there was an area of bleeding and this later I could tell was coming from along the greater curvature were the area where the tube had been placed through that and this little vessel was separated and sutured with a 2-0 Vicryl.  I then took the little plug out and had an opening in the stomach probably about 1 or 2 fingers so I could run my finger up in it and make sure I did not feel  the stitch or anything, and the little T-bar may have been in the actual area that I excised.  They did looked like it was kind of a little chronic infection a kind of in this omental area along the greater curvature that we removed with the specimen.  I then closed the little gastrocutaneous fistula with a kind of a linear closure with a continuous 2-0 Vicryl and then after that was closed then I inverted with 2-0 silk to actually for a second layer.  I then irrigated, aspirated, I also irrigated the NG tube and the little bit of blood that was in it cleared and we are going to take the NG tube out at the completion of surgery.  The area was inspected and there was no evidence of any bleeding.  I could run my finger in working through a real small hole and I do not think we were anywhere close to the transverse colon, etc. and I then closed the fascia with interrupted sutures both anterior posterior layer of O Prolene and about eight sutures all total were used.  The wound itself kind of freed up a little subcutaneous tissue, put Betadine solution in it and then used 3 skin staples only because I would not be surprised if he has a little drainage from the skin area.  The patient tolerated procedure nicely and we are going to keep him n.p.o. today, but if he is not nauseous we can start  him on a liquid diet tomorrow and then hopefully he will be out of the hospital in 2 or 3 days.     Anselm Pancoast. Zachery Dakins, M.D.     WJW/MEDQ  D:  05/03/2010  T:  05/03/2010  Job:  [id number]  cc:   Rosalyn Gess. Norins, MD [address] [place] [address]  Eda Keys  Electronically Signed by Consuello Bossier M.D. on 05/07/2010 11:33:22 AM

## 2010-05-07 NOTE — Discharge Summary (Signed)
NAME:  Brandon [name], Brandon [name]                ACCOUNT NO.:  [id number]  MEDICAL RECORD NO.:  [id number]          PATIENT TYPE:  OIB  LOCATION:  1526                         FACILITY:  [facility]  PHYSICIAN:  Anselm Pancoast. 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  OPERATION:  Excision of gastric cutaneous fistula, old G-tube site.  HISTORY:  Brandon [name] is a 60 year old Caucasian male who had a tonsillar cancer with cervical submandibular nodes approximately a year ago treated with radiation chemotherapy.  He had had a radiology-placed PEG tube for nutritional support received during the chemoradiation combination; and after approximately 4 months of tube feeding, the PEG tube was no longer needed, was removed, but he kept having areas of breakdown and gastric drainage through the old tube placement.  This was treated with silver nitrate by the radiologist PAs several times, and then I first saw the patient approximately 3 months ago.  On a PET scan, which was being done for evaluation of his tumor status which showed no evidence of persistent tumor, they noted a clip (one of the T-bar clips used for placement of the tube) that was kind of in the G-tube site. With this being noted, they referred him to me; and on examination when I saw him (I had seen him previously for hernia repairs), the little area was just a little rosebud of granulation tissue and we kind of cauterized it several times in a little superficial debridement, but it looked like it was going to heal but then break open again.  I recommended that if it did not heal with kind of conservative managements with 2-3 months, that we would plan on excising the  area; and since it has not healed, he is here for that procedure.  Brandon [name] is his regular physician, and his physical exam was otherwise unremarkable approximately a week earlier and Dr. Debby Bud asked that I get a total testosterone level since the patient had requested it.  As far as his chronic medications, the patient is on ibuprofen and zolpidem 10 mg Ambien for sleep.  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  The nail had been removed in the ER.  It had not gone through the bone or any significant damage, and he had been placed on Keflex that he was on 3 days when he presented for his surgery on Monday.  The wound of his hand looked as if it was healing satisfactory and we went ahead and took him to surgery where, through a small transverse incision, I excised the actual fistulous tract, closed the defect in his stomach with two layers with 2-0  Vicryl inner layer and 2-0 silk outer later, and then closed the small (probably 2-inch) incision with interrupted Prolene sutures.  I stapled the skin very loosely with three staples since it is kind of a chronically infected wound, and he has done nicely.  On the day of surgery, he was not able to void satisfactory and his bladder got distended and a Foley catheter was inserted which relieved his pressure.  He was thinking that the pain in his bladder was related to his surgery.  I think it was related to aggressive morphine use by the patient.  With the Foley catheter placed, his pain essentially subsided the following day.  We did start him on a liquid diet which he has tolerated without problems, and I have had him on Unasyn IV q.6 h. for the first approximately 2 days.  His incisions of both  his hand and the G-tube incision site look like they are healing fine, and he is receiving Vicodin for pain.  I suggested that, one, he stick with kind of a full liquid diet for approximately 2-3 days and then advance to a select diet and let me plan on seeing in the office early next week.  He has got, I think, 3 more days of Keflex which he will complete and has Vicodin for pain.  He is going to be out of the office or his work area for at least 1-2 weeks and understands that he should not really do any heavy lifting (by heavy, we are talking about 30 pounds and higher) for approximately an additional 2-3 weeks.  The lab studies that we checked preoperatively were all unremarkable. His Bents count was 5000 and his hematocrit was 43.  His nose swabs were negative for MRSA and staph.  His electrolytes and liver function studies were normal.  His total testosterone level was 388, and a chest x-ray showed no evidence of any active disease.  He had a postop hematocrit which was slightly changed, but I think that was dilutional for IV fluids, and his abdomen has done nicely.  He has passed a little bowel flatus, no actual bowel movement, and understands that if Milk of Magnesia is needed he can use it.  His path report showed a chronic fistulous tract from the stomach to the skin with some foreign body reactions.  He is discharged in improved condition, voiding without problems this morning, and tolerating a diet without any issues.     Anselm Pancoast. Zachery Dakins, M.D.     WJW/MEDQ  D:  05/05/2010  T:  05/05/2010  Job:  [id number]  cc:   Rosalyn Gess. Norins, MD [address] [place] [address]  Electronically Signed by Consuello Bossier M.D. on 05/07/2010 11:33:25 AM

## 2010-05-10 NOTE — Letter (Signed)
[facility]   Im[facility] By: Sherian Rein 06/25/2009 14:34:01  _____________________________________________________________________  External Attachment:    Type:   Image     Comment:   External Document

## 2010-05-10 NOTE — Letter (Signed)
[facility]   Imported By: Sherian Rein 07/05/2009 11:52:50  _____________________________________________________________________  External Attachment:    Type:   Image     Comment:   External Document

## 2010-05-10 NOTE — Letter (Signed)
[facility]   Imported By: Sherian Rein 06/25/2009 14:52:49  _____________________________________________________________________  External Attachment:    Type:   Image     Comment:   External Document

## 2010-05-10 NOTE — Letter (Signed)
Summary: NP [facility]  [facility]   Imported By: Sherian Rein 04/26/2009 11:31:39  _____________________________________________________________________  External Attachment:    Type:   Image     Comment:   External Document

## 2010-05-10 NOTE — Letter (Signed)
[facility]   Imported By: Sherian Rein 07/29/2009 13:17:18  _____________________________________________________________________  External Attachment:    Type:   Image     Comment:   External Document

## 2010-05-10 NOTE — Letter (Signed)
[facility]   Imported By: Lester Rosa Sanchez 05/27/2009 07:26:08  _____________________________________________________________________  External Attachment:    Type:   Image     Comment:   External Document

## 2010-05-10 NOTE — Letter (Signed)
[facility]   Imported By: Lennie Odor 08/02/2009 17:03:08  _____________________________________________________________________  External Attachment:    Type:   Image     Comment:   External Document

## 2010-05-10 NOTE — Letter (Signed)
[facility]   Imported By: Lennie Odor 10/06/2009 13:48:13  _____________________________________________________________________  External Attachment:    Type:   Image     Comment:   External Document

## 2010-05-10 NOTE — Letter (Signed)
[facility]   Imported By: Sherian Rein 08/24/2009 12:00:56  _____________________________________________________________________  External Attachment:    Type:   Image     Comment:   External Document

## 2010-05-10 NOTE — Letter (Signed)
Summary: [facility]  [facility]   Imported By: Sherian Rein 02/04/2010 11:40:35  _____________________________________________________________________  External Attachment:    Type:   Image     Comment:   External Document

## 2010-05-10 NOTE — Letter (Signed)
Summary: NP Eval/[facility]  NP Eval/[facility]   Imported By: Sherian Rein 04/26/2009 11:30:10  _____________________________________________________________________  External Attachment:    Type:   Image     Comment:   External Document

## 2010-05-10 NOTE — Letter (Signed)
[facility]   Imported By: Lennie Odor 11/01/2009 10:35:38  _____________________________________________________________________  External Attachment:    Type:   Image     Comment:   External Document

## 2010-05-10 NOTE — Progress Notes (Signed)
Summary: Omeprazole refill  Phone Note Refill Request Message from:  Fax from Pharmacy on Aug 17, 2009 3:27 PM  Refills Requested: Medication #1:  PRILOSEC 20 MG  CPDR once daily Initial call taken by: Lucious Groves,  Aug 17, 2009 3:27 PM    Prescriptions: PRILOSEC 20 MG  CPDR (OMEPRAZOLE) once daily  #90 x 1   Entered by:   Lucious Groves   Authorized by:   Jacques Navy MD   Signed by:   Lucious Groves on 08/17/2009   Method used:   Faxed to ...       [facility])             , [place]         Ph: [phone]       Fax: [phone]   RxID:   [id number]

## 2010-05-10 NOTE — Letter (Signed)
[facility]   Imported By: Sherian Rein 05/19/2009 13:22:03  _____________________________________________________________________  External Attachment:    Type:   Image     Comment:   External Document

## 2010-05-10 NOTE — Assessment & Plan Note (Signed)
Summary: flu shot/men/cd  - coming at 2:30 pm / cd   Nurse Visit   Allergies: No Known Drug Allergies  Orders Added: 1)  Admin 1st Vaccine [90471] 2)  Flu Vaccine 21yrs + [[address]] Flu Vaccine Consent Questions     Do you have a history of severe allergic reactions to this vaccine? no    Any prior history of allergic reactions to egg and/or gelatin? no    Do you have a sensitivity to the preservative Thimersol? no    Do you have a past history of Guillan-Barre Syndrome? no    Do you currently have an acute febrile illness? no    Have you ever had a severe reaction to latex? no    Vaccine information given and explained to patient? yes    Are you currently pregnant? no    Lot Number:[id number]   Exp Date:10/07/2009   Site Given  Left Deltoid IM   .lbflu

## 2010-05-10 NOTE — Letter (Signed)
[facility]   Imported By: Lennie Odor 06/09/2009 12:17:42  _____________________________________________________________________  External Attachment:    Type:   Image     Comment:   External Document

## 2010-05-12 NOTE — Letter (Signed)
Summary: [facility]  [facility]   Imported By: Sherian Rein 04/14/2010 12:35:10  _____________________________________________________________________  External Attachment:    Type:   Image     Comment:   External Document

## 2010-05-12 NOTE — Assessment & Plan Note (Signed)
Summary: CPX / NWS  #   Vital Signs:  Patient profile:   60 year old male Height:      69 inches Weight:      177 pounds BMI:     26.23 O2 Sat:      96 % on Room air Temp:     98.1 degrees F oral Pulse rate:   55 / minute BP sitting:   120 / 80  (left arm) Cuff size:   large  Vitals Entered By: Bill Salinas CMA (April 28, 2010 1:27 PM)  O2 Flow:  Room air  Primary Care Provider:  Jacques Navy MD   History of Present Illness: [name] presents today for general medical follow-up. He ostensibly was for CPX but in the interval since his last CPX he has had a THR left secondary to avascular necrosis and he has been diagnosed with tonsilar squamous cell carcinoma left and has had full course of XRT and several rounds of chemotherapy. Along the way he has had full physical exams. He reports that he is actually doing well. With the first round of chemotherapy he had renal failure with a 5 days hospitalization. His regimen was changed and he has done well since. He has and does work several hours a day in his [employer]   He has no specific complaints today. He is however concerned about erectile function and possible libido issues. He is capable of an erection but not in a reliable and timely fashion. He is asking about the use of Viagra, etc. He has no contra-indications for use.   Current Medications (verified): 1)  Prilosec 20 Mg  Cpdr (Omeprazole) .... Once Daily 2)  Ibuprofen 200 Mg  Tabs (Ibuprofen) .... As Needed 3)  Benadryl 25 Mg  Caps (Diphenhydramine Hcl) .... As Needed 4)  Gabapentin 400 Mg Caps (Gabapentin) .Marland Kitchen.. 1 By Mouth Qid 5)  Ambien 10 Mg Tabs (Zolpidem Tartrate) .Marland Kitchen.. 1 At Bedtime  Allergies (verified): No Known Drug Allergies  Past History:  Past Medical History: UCD CARCINOMA, SQUAMOUS CELL (ICD-199.1) IMPOTENCE OF ORGANIC ORIGIN (ICD-607.84) ASEPTIC NECROSIS OF HEAD AND NECK OF FEMUR (ICD-733.42) INGUINAL PAIN, LEFT (ICD-789.09) Hx of TOBACCO  ABUSE (ICD-305.1) URTICARIA (ICD-708.9) PROSTATITIS, HX OF (ICD-V13.09) GERD (ICD-530.81)  Past Surgical History: Bone spur removal-5th digit  Cyst drainage-elbow Hemorrhoidectomy Inguinal herniorrhaphy-right '92 left THR - AVN '11  Family History: Reviewed history from 12/06/2007 and no changes required. father - deceased @ 51: cancer lung-smoker; HTN, Lipid, DM mother - 38: lymphoma -chemo;  neg- colon or prostate cancer; CAD  Social History: Reviewed history from 12/06/2007 and no changes required. 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  Review of Systems       The patient complains of weight loss.  The patient denies anorexia, fever, vision loss, decreased hearing, hoarseness, chest pain, syncope, dyspnea on exertion, peripheral edema, prolonged cough, abdominal pain, hematochezia, genital sores, muscle weakness, difficulty walking, abnormal bleeding, and enlarged lymph nodes.    Physical Exam  General:  alert, well-developed, well-nourished, well-hydrated, and normal appearance.   Head:  normocephalic, atraumatic, and no abnormalities observed.   Eyes:  vision grossly intact, pupils equal, and pupils round.   Neck:  loss of tissue mass left neck secondary to XRT Chest Wall:  no deformities.   Lungs:  normal respiratory effort, normal breath sounds, no crackles, and no wheezes.  Heart:  normal rate and regular rhythm.   Msk:  no joint tenderness, no joint swelling, no joint warmth, and no redness over joints.   Pulses:  2+ radial Neurologic:  alert & oriented X3, cranial nerves II-XII intact, and gait normal.   Skin:  turgor normal, color normal, and no rashes.   Psych:  Oriented X3, good eye contact, and not anxious appearing.     Impression & Recommendations:  Problem # 1:  CARCINOMA, SQUAMOUS CELL (ICD-199.1) Continueing chemo therapy. Tolerating well and doing well.  Reviewed CCC correspondence  Problem # 2:  IMPOTENCE OF ORGANIC ORIGIN (ICD-607.84) trial of viagra. If results are not satisfactory will proceed with checking a testosterone level.   Problem # 3:  ASEPTIC NECROSIS OF HEAD AND NECK OF FEMUR (ICD-733.42) good recovery from [facility], walking well. He has no hip pain at this time.  Problem # 4:  GERD (ICD-530.81) Stable on PPI therapy.  His updated medication list for this problem includes:    Prilosec 20 Mg Cpdr (Omeprazole) ..... Once daily  Complete Medication List: 1)  Prilosec 20 Mg Cpdr (Omeprazole) .... Once daily 2)  Ibuprofen 200 Mg Tabs (Ibuprofen) .... As needed 3)  Benadryl 25 Mg Caps (Diphenhydramine hcl) .... As needed 4)  Gabapentin 400 Mg Caps (Gabapentin) .Marland Kitchen.. 1 by mouth qid 5)  Ambien 10 Mg Tabs (Zolpidem tartrate) .Marland Kitchen.. 1 at bedtime Prescriptions: PRILOSEC 20 MG  CPDR (OMEPRAZOLE) once daily  #90 x 3   Entered and Authorized by:   Jacques Navy MD   Signed by:   Jacques Navy MD on 04/28/2010   Method used:   Electronically to        [facility] (mail-order)       [address]       [place], [place]  [address]       Ph: [phone]       Fax: [phone]   RxID:   [id number]    Orders Added: 1)  Est. Patient Level III [84132]   Immunization History:  Influenza Immunization History:    Influenza:  historical (01/09/2010)   Immunization History:  Influenza Immunization History:    Influenza:  Historical (01/09/2010)

## 2010-05-12 NOTE — Progress Notes (Signed)
Summary: TDAP  Phone Note Call from Patient Call back at Home Phone [phone]   Caller: Spouse Summary of Call: Pt's spouse called to advise that pt recieved TDAP vaccination this passed Friday January 20th 2012. Initial call taken by: Margaret Pyle, CMA,  May 02, 2010 9:46 AM      Immunization History:  Tetanus/Td Immunization History:    Tetanus/Td:  tdap (04/29/2010)

## 2010-05-19 ENCOUNTER — Telehealth: Payer: Self-pay | Admitting: Internal Medicine

## 2010-05-24 ENCOUNTER — Telehealth: Payer: Self-pay | Admitting: Internal Medicine

## 2010-05-26 NOTE — Progress Notes (Signed)
  Phone Note Refill Request Message from:  Patient on May 19, 2010 12:01 PM  Refills Requested: Medication #1:  PRILOSEC 20 MG  CPDR once daily Pharmacy never received to escript per pt  Initial call taken by: Rock Nephew CMA,  May 19, 2010 12:02 PM    Prescriptions: PRILOSEC 20 MG  CPDR (OMEPRAZOLE) once daily  #90 x 3   Entered by:   Rock Nephew CMA   Authorized by:   Jacques Navy MD   Signed by:   Rock Nephew CMA on 05/19/2010   Method used:   Faxed to ...       [id number]       [address]       [place], [place]  [address]       Ph: [phone] or [phone]       Fax: [phone]   RxID:   [id number]

## 2010-06-01 NOTE — Progress Notes (Signed)
Summary: CALL  Phone Note Call from Patient Call back at [facility] Phone [phone]   Summary of Call: Wife called - wants a call back regarding rx.  Initial call taken by: Lamar Sprinkles, CMA,  May 24, 2010 11:15 AM    Prescriptions: PRILOSEC 20 MG  CPDR (OMEPRAZOLE) once daily  #90 x 3   Entered by:   Lamar Sprinkles, CMA   Authorized by:   Jacques Navy MD   Signed by:   Lamar Sprinkles, CMA on 05/24/2010   Method used:   Faxed to ...       [facility] [phone] (retail)       [address] [address]       [place], [place]  [id number]       Ph: [phone]       Fax: [phone]   RxID:   [id number]

## 2010-06-16 ENCOUNTER — Other Ambulatory Visit: Payer: Self-pay | Admitting: Oncology

## 2010-06-16 ENCOUNTER — Ambulatory Visit (HOSPITAL_COMMUNITY)
Admission: RE | Admit: 2010-06-16 | Discharge: 2010-06-16 | Disposition: A | Discharge status: Home / Self Care | Payer: 59 | Source: Ambulatory Visit | Attending: Oncology | Admitting: Oncology

## 2010-06-16 ENCOUNTER — Encounter (HOSPITAL_COMMUNITY): Payer: Self-pay

## 2010-06-16 ENCOUNTER — Encounter (HOSPITAL_BASED_OUTPATIENT_CLINIC_OR_DEPARTMENT_OTHER): Payer: 59 | Admitting: Oncology

## 2010-06-16 DIAGNOSIS — M47812 Spondylosis without myelopathy or radiculopathy, cervical region: Secondary | ICD-10-CM | POA: Insufficient documentation

## 2010-06-16 DIAGNOSIS — R1310 Dysphagia, unspecified: Secondary | ICD-10-CM | POA: Insufficient documentation

## 2010-06-16 DIAGNOSIS — J984 Other disorders of lung: Secondary | ICD-10-CM | POA: Insufficient documentation

## 2010-06-16 DIAGNOSIS — C109 Malignant neoplasm of oropharynx, unspecified: Secondary | ICD-10-CM | POA: Insufficient documentation

## 2010-06-16 DIAGNOSIS — Z923 Personal history of irradiation: Secondary | ICD-10-CM | POA: Insufficient documentation

## 2010-06-16 DIAGNOSIS — C099 Malignant neoplasm of tonsil, unspecified: Secondary | ICD-10-CM

## 2010-06-16 DIAGNOSIS — C069 Malignant neoplasm of mouth, unspecified: Secondary | ICD-10-CM

## 2010-06-16 DIAGNOSIS — R220 Localized swelling, mass and lump, head: Secondary | ICD-10-CM | POA: Insufficient documentation

## 2010-06-16 DIAGNOSIS — Z9221 Personal history of antineoplastic chemotherapy: Secondary | ICD-10-CM | POA: Insufficient documentation

## 2010-06-16 HISTORY — DX: Malignant (primary) neoplasm, unspecified: C80.1

## 2010-06-16 LAB — CMP (CANCER CENTER ONLY)
Albumin: 3.5 g/dL (ref 3.3–5.5)
BUN, Bld: 16 mg/dL (ref 7–22)
CO2: 30 mEq/L (ref 18–33)
Calcium: 9.4 mg/dL (ref 8.0–10.3)
Chloride: 99 mEq/L (ref 98–108)
Creat: 0.9 mg/dl (ref 0.6–1.2)
Glucose, Bld: 106 mg/dL (ref 73–118)
Potassium: 3.9 mEq/L (ref 3.3–4.7)

## 2010-06-16 LAB — CBC WITH DIFFERENTIAL/PLATELET
Basophils Absolute: 0 10*3/uL (ref 0.0–0.1)
Eosinophils Absolute: 0.2 10*3/uL (ref 0.0–0.5)
HCT: 43.5 % (ref 38.4–49.9)
HGB: 14.9 g/dL (ref 13.0–17.1)
MCH: 31.4 pg (ref 27.2–33.4)
MONO#: 0.4 10*3/uL (ref 0.1–0.9)
NEUT#: 3.7 10*3/uL (ref 1.5–6.5)
NEUT%: 69 % (ref 39.0–75.0)
lymph#: 1 10*3/uL (ref 0.9–3.3)

## 2010-06-16 MED ORDER — IOHEXOL 300 MG/ML  SOLN
100.0000 mL | Freq: Once | INTRAMUSCULAR | Status: AC | PRN
  Administered 2010-06-16: 100 mL via INTRAVENOUS

## 2010-06-17 ENCOUNTER — Other Ambulatory Visit: Payer: Self-pay | Admitting: Oncology

## 2010-06-17 ENCOUNTER — Encounter: Payer: 59 | Admitting: Oncology

## 2010-06-17 DIAGNOSIS — Z85818 Personal history of malignant neoplasm of other sites of lip, oral cavity, and pharynx: Secondary | ICD-10-CM

## 2010-06-17 DIAGNOSIS — B977 Papillomavirus as the cause of diseases classified elsewhere: Secondary | ICD-10-CM

## 2010-06-23 ENCOUNTER — Ambulatory Visit (HOSPITAL_COMMUNITY)
Admission: RE | Admit: 2010-06-23 | Discharge: 2010-06-23 | Disposition: A | Discharge status: Home / Self Care | Payer: 59 | Source: Ambulatory Visit | Attending: Oncology | Admitting: Oncology

## 2010-06-23 ENCOUNTER — Encounter (HOSPITAL_COMMUNITY): Payer: Self-pay

## 2010-06-23 DIAGNOSIS — C090 Malignant neoplasm of tonsillar fossa: Secondary | ICD-10-CM | POA: Insufficient documentation

## 2010-06-23 DIAGNOSIS — Z85818 Personal history of malignant neoplasm of other sites of lip, oral cavity, and pharynx: Secondary | ICD-10-CM

## 2010-06-23 DIAGNOSIS — Q619 Cystic kidney disease, unspecified: Secondary | ICD-10-CM | POA: Insufficient documentation

## 2010-06-23 MED ORDER — IOHEXOL 300 MG/ML  SOLN
80.0000 mL | Freq: Once | INTRAMUSCULAR | Status: AC | PRN
  Administered 2010-06-23: 80 mL via INTRAVENOUS

## 2010-06-26 LAB — DIFFERENTIAL
Basophils Absolute: 0 10*3/uL (ref 0.0–0.1)
Lymphocytes Relative: 4 % — ABNORMAL LOW (ref 12–46)
Monocytes Relative: 6 % (ref 3–12)

## 2010-06-26 LAB — BASIC METABOLIC PANEL
CO2: 28 mEq/L (ref 19–32)
Calcium: 9.2 mg/dL (ref 8.4–10.5)
Glucose, Bld: 140 mg/dL — ABNORMAL HIGH (ref 70–99)
Sodium: 139 mEq/L (ref 135–145)

## 2010-06-26 LAB — CBC
Platelets: 547 10*3/uL — ABNORMAL HIGH (ref 150–400)
RDW: 14.2 % (ref 11.5–15.5)

## 2010-06-29 ENCOUNTER — Encounter: Payer: Self-pay | Admitting: Internal Medicine

## 2010-06-29 LAB — BASIC METABOLIC PANEL
BUN: 45 mg/dL — ABNORMAL HIGH (ref 6–23)
BUN: 64 mg/dL — ABNORMAL HIGH (ref 6–23)
BUN: 92 mg/dL — ABNORMAL HIGH (ref 6–23)
BUN: 99 mg/dL — ABNORMAL HIGH (ref 6–23)
CO2: 25 mEq/L (ref 19–32)
CO2: 31 mEq/L (ref 19–32)
CO2: 32 mEq/L (ref 19–32)
Calcium: 8 mg/dL — ABNORMAL LOW (ref 8.4–10.5)
Calcium: 8 mg/dL — ABNORMAL LOW (ref 8.4–10.5)
Chloride: 92 mEq/L — ABNORMAL LOW (ref 96–112)
Chloride: 97 mEq/L (ref 96–112)
Chloride: 99 mEq/L (ref 96–112)
Creatinine, Ser: 3.87 mg/dL — ABNORMAL HIGH (ref 0.4–1.5)
Creatinine, Ser: 4.76 mg/dL — ABNORMAL HIGH (ref 0.4–1.5)
GFR calc Af Amer: 15 mL/min — ABNORMAL LOW (ref >60)
GFR calc Af Amer: 20 mL/min — ABNORMAL LOW (ref >60)
GFR calc non Af Amer: 12 mL/min — ABNORMAL LOW (ref >60)
GFR calc non Af Amer: 16 mL/min — ABNORMAL LOW (ref >60)
Glucose, Bld: 102 mg/dL — ABNORMAL HIGH (ref 70–99)
Glucose, Bld: 122 mg/dL — ABNORMAL HIGH (ref 70–99)
Potassium: 2.7 mEq/L — CL (ref 3.5–5.1)
Potassium: 2.8 mEq/L — ABNORMAL LOW (ref 3.5–5.1)
Potassium: 3.9 mEq/L (ref 3.5–5.1)
Sodium: 132 mEq/L — ABNORMAL LOW (ref 135–145)
Sodium: 136 mEq/L (ref 135–145)

## 2010-06-29 LAB — DIFFERENTIAL
Basophils Absolute: 0 10*3/uL (ref 0.0–0.1)
Basophils Relative: 0 % (ref 0–1)
Eosinophils Absolute: 0.1 10*3/uL (ref 0.0–0.7)
Neutro Abs: 11.5 10*3/uL — ABNORMAL HIGH (ref 1.7–7.7)
Neutrophils Relative %: 85 % — ABNORMAL HIGH (ref 43–77)

## 2010-06-29 LAB — COMPREHENSIVE METABOLIC PANEL
ALT: 13 U/L (ref 0–53)
ALT: 14 U/L (ref 0–53)
AST: 20 U/L (ref 0–37)
BUN: 37 mg/dL — ABNORMAL HIGH (ref 6–23)
CO2: 29 mEq/L (ref 19–32)
Calcium: 7.8 mg/dL — ABNORMAL LOW (ref 8.4–10.5)
Calcium: 8.4 mg/dL (ref 8.4–10.5)
Creatinine, Ser: 5.31 mg/dL — ABNORMAL HIGH (ref 0.4–1.5)
GFR calc Af Amer: 14 mL/min — ABNORMAL LOW (ref >60)
GFR calc non Af Amer: 11 mL/min — ABNORMAL LOW (ref >60)
GFR calc non Af Amer: 19 mL/min — ABNORMAL LOW (ref >60)
Glucose, Bld: 95 mg/dL (ref 70–99)
Sodium: 137 mEq/L (ref 135–145)
Sodium: 140 mEq/L (ref 135–145)
Total Protein: 5.1 g/dL — ABNORMAL LOW (ref 6.0–8.3)

## 2010-06-29 LAB — CBC
HCT: 29 % — ABNORMAL LOW (ref 39.0–52.0)
Hemoglobin: 10.1 g/dL — ABNORMAL LOW (ref 13.0–17.0)
MCHC: 34.3 g/dL (ref 30.0–36.0)
MCHC: 34.3 g/dL (ref 30.0–36.0)
MCHC: 34.6 g/dL (ref 30.0–36.0)
MCV: 83.4 fL (ref 78.0–100.0)
MCV: 84.4 fL (ref 78.0–100.0)
MCV: 84.7 fL (ref 78.0–100.0)
Platelets: 281 10*3/uL (ref 150–400)
Platelets: 318 10*3/uL (ref 150–400)
RBC: 3.39 MIL/uL — ABNORMAL LOW (ref 4.22–5.81)
RBC: 3.43 MIL/uL — ABNORMAL LOW (ref 4.22–5.81)
RDW: 14.7 % (ref 11.5–15.5)
RDW: 14.7 % (ref 11.5–15.5)
WBC: 13.6 10*3/uL — ABNORMAL HIGH (ref 4.0–10.5)

## 2010-06-29 LAB — PHOSPHORUS
Phosphorus: 4 mg/dL (ref 2.3–4.6)
Phosphorus: 4.3 mg/dL (ref 2.3–4.6)

## 2010-06-29 LAB — URINALYSIS, ROUTINE W REFLEX MICROSCOPIC
Glucose, UA: 100 mg/dL — AB
Ketones, ur: NEGATIVE mg/dL
Leukocytes, UA: NEGATIVE
Nitrite: NEGATIVE
Protein, ur: 30 mg/dL — AB
Urobilinogen, UA: 0.2 mg/dL (ref 0.0–1.0)

## 2010-06-29 LAB — PROTEIN / CREATININE RATIO, URINE
Creatinine, Urine: 43 mg/dL
Protein Creatinine Ratio: 0.4 — ABNORMAL HIGH (ref 0.00–0.15)
Total Protein, Urine: 17 mg/dL

## 2010-06-29 LAB — MAGNESIUM
Magnesium: 1.3 mg/dL — ABNORMAL LOW (ref 1.5–2.5)
Magnesium: 1.4 mg/dL — ABNORMAL LOW (ref 1.5–2.5)
Magnesium: 1.5 mg/dL (ref 1.5–2.5)
Magnesium: 1.7 mg/dL (ref 1.5–2.5)

## 2010-06-29 LAB — NA AND K (SODIUM & POTASSIUM), RAND UR: Potassium Urine: 27 mEq/L

## 2010-06-29 LAB — URIC ACID
Uric Acid, Serum: 2.3 mg/dL — ABNORMAL LOW (ref 4.0–7.8)
Uric Acid, Serum: <0.7 mg/dL — ABNORMAL LOW (ref 4.0–7.8)

## 2010-06-29 LAB — URINE MICROSCOPIC-ADD ON

## 2010-06-29 LAB — SODIUM, URINE, RANDOM: Sodium, Ur: 69 mEq/L

## 2010-06-29 LAB — CREATININE, URINE, RANDOM: Creatinine, Urine: 50.7 mg/dL

## 2010-06-29 LAB — CK: Total CK: 12 U/L (ref 7–232)

## 2010-07-07 NOTE — Letter (Signed)
Summary: Colonoscopy Date Change Letter  [facility]  [address] [place], [place] [address]   Phone: [phone]  Fax: [phone]      June 29, 2010 MRN: [id number]   [facility] [address] [place], [place]  [address]   Dear Mr. DUFFEY,   Previously you were recommended to have a repeat colonoscopy around this time. Your chart was recently reviewed by Dr. Lina Sar of [facility]. Follow up colonoscopy is now recommended in March 2015. This revised recommendation is based on current, nationally recognized guidelines for colorectal cancer screening and polyp surveillance. These guidelines are endorsed by the American Cancer Society, [employer] on Colorectal Cancer as well as numerous other major medical organizations.  Please understand that our recommendation assumes that you do not have any new symptoms such as bleeding, a change in bowel habits, anemia, or significant abdominal discomfort. If you do have any concerning GI symptoms or want to discuss the guideline recommendations, please call to arrange an office visit at your earliest convenience. Otherwise we will keep you in our reminder system and contact you 1-2 months prior to the date listed above to schedule your next colonoscopy.  Thank you,  Hedwig Morton. Juanda Chance, M.D.  [facility] [phone]

## 2010-07-19 LAB — BASIC METABOLIC PANEL
BUN: 12 mg/dL (ref 6–23)
BUN: 16 mg/dL (ref 6–23)
BUN: 28 mg/dL — ABNORMAL HIGH (ref 6–23)
CO2: 27 mEq/L (ref 19–32)
Calcium: 10 mg/dL (ref 8.4–10.5)
Chloride: 101 mEq/L (ref 96–112)
Chloride: 102 mEq/L (ref 96–112)
Creatinine, Ser: 0.69 mg/dL (ref 0.4–1.5)
GFR calc non Af Amer: >60 mL/min (ref >60)
Glucose, Bld: 126 mg/dL — ABNORMAL HIGH (ref 70–99)
Glucose, Bld: 78 mg/dL (ref 70–99)
Potassium: 4.1 mEq/L (ref 3.5–5.1)
Sodium: 142 mEq/L (ref 135–145)

## 2010-07-19 LAB — CBC
HCT: 40.7 % (ref 39.0–52.0)
Hemoglobin: 16.6 g/dL (ref 13.0–17.0)
MCHC: 34.3 g/dL (ref 30.0–36.0)
MCV: 95.9 fL (ref 78.0–100.0)
MCV: 96.6 fL (ref 78.0–100.0)
Platelets: 225 10*3/uL (ref 150–400)
Platelets: 229 10*3/uL (ref 150–400)
Platelets: 237 10*3/uL (ref 150–400)
RDW: 14.6 % (ref 11.5–15.5)
WBC: 7.9 10*3/uL (ref 4.0–10.5)
WBC: 8 10*3/uL (ref 4.0–10.5)
WBC: 8.3 10*3/uL (ref 4.0–10.5)

## 2010-07-19 LAB — URINALYSIS, ROUTINE W REFLEX MICROSCOPIC
Nitrite: NEGATIVE
Specific Gravity, Urine: 1.024 (ref 1.005–1.030)
pH: 6.5 (ref 5.0–8.0)

## 2010-07-19 LAB — TYPE AND SCREEN
ABO/RH(D): B POS
Antibody Screen: NEGATIVE

## 2010-07-19 LAB — DIFFERENTIAL
Basophils Absolute: 0 10*3/uL (ref 0.0–0.1)
Lymphocytes Relative: 19 % (ref 12–46)
Lymphs Abs: 1.5 10*3/uL (ref 0.7–4.0)
Neutro Abs: 5.7 10*3/uL (ref 1.7–7.7)

## 2010-08-23 NOTE — Op Note (Signed)
NAME:  Brandon [name], Brandon [name] NO.:  [id number]   MEDICAL RECORD NO.:  [id number]          PATIENT TYPE:  INP   LOCATION:  0004                         FACILITY:  [facility]   PHYSICIAN:  Madlyn Frankel. Charlann Boxer, M.D.  DATE OF BIRTH:  1951-01-28   DATE OF PROCEDURE:  09/01/2008  DATE OF DISCHARGE:                               OPERATIVE REPORT   PREOPERATIVE DIAGNOSIS:  Left hip degenerative joint changes.   POSTOPERATIVE DIAGNOSIS:  Left hip degenerative joint changes.   PROCEDURE:  Left total hip replacement.   COMPONENTS USED:  DePuy hip system size 50 Pinnacle cup with a 36 metal  liner, a size 7 high Tri-Lock stem with a 36 +1.5 ball.   SURGEON:  Madlyn Frankel. Charlann Boxer, M.D.   ASSISTANT:  Yetta Glassman. Mann, PA.   ANESTHESIA:  General.   BLOOD LOSS:  200 mL.   DRAINS:  x1.   FINDINGS:  None.   COMPLICATIONS:  None.   INDICATIONS FOR PROCEDURE:  Brandon [name] is a 60 year old gentleman who  presented to the office for evaluation and referral for left hip pain.  He had had previous workup including radiographs revealing evidence of  old sclerotic avascular changes.  An MRI supporting that without  evidence of acute edematous changes consistent with an acute avascular  process, but there were some degenerative changes noted in the joint  including degenerative labral pathology.  I had a lengthy discussion  regarding the pathophysiology of his pain, the location of the pain, and  a differential associated with it.  We have tried an intra-articular  injection in the left hip.  At this point he had failure to respond to  any conservative measures and after further discussion of the risks and  benefits, including the persistence of the discomfort despite attempts  to try to isolate the source of his pain, he wished to proceed with hip  replacement surgery.  The risks of infection, DVT, component failure,  dislocation, were all discussed and reviewed.  Consent was obtained for  benefit  of pain relief.   PROCEDURE IN DETAIL:  The patient was brought to the operating theater.  Once adequate anesthesia and preoperative antibiotics, Ancef, were  administered, the patient was positioned in the right lateral decubitus  with the left side up.  The left lower extremity was prescrubbed,  prepped and draped in a sterile fashion.  Then a lateral-based incision  was made from through a posterior approach in the hip.  The iliotibial  and gluteal fascia were then incised posteriorly.  The short external  rotators were identified and taken down, including the capsule from just  inferior to the piriformis down to the lesser trochanter.  The hip was  dislocated and a neck osteotomy was made based off anatomical landmarks,  preoperative templating and utilizing a standard broach with 28-mm head  centered into the femoral head.   Findings and pathology were noted.   Attention was first directed to the femur.  The femoral canal was first  opened with a box osteotome, then a starting drill.  I used a hand  reamer and then irrigated the canal.  I began broaching with a size 1  broach, setting the anteversion at about 20 degrees.  I broached up to a  size 7, which sat at the level where the neck cut was.   Following this, I packed the femur and now attended to the acetabulum.  Acetabular exposure was obtained, the labrum debrided.  I began reaming  with a 48-mm reamer and reamed up to a 51-mm reamer.  I then impacted a  52 Pinnacle cup at 35-40 degrees of abduction and 20 degrees of forward  flexion.  I placed a single cancellous screw to support the initial  scratch fit.  The trial liner was placed.  Attention was now directed to  a trial reduction with a 7 standard broach in place with a standard neck  atop a 36 +1.5 ball.  Leg lengths appeared comparable to the down leg.  Hip stability was excellent, without evidence of impingement.  There was  a millimeter or two with shuck.  Given  these parameters, I went ahead  and removed the trial components, placed a central hole in the final 36  metal liner.   At this point the size 7 standard Tri-Lock stem was then impacted  sitting at about the level where the broach was.  Based on the trial  reduction, I went ahead and used a 36 +1.5 ball.  The hip was then  irrigated as it had been throughout the case, and the hip reduced.   The hip stability was as good as it was on trial reduction.  I  reapproximated the posterior capsular tissues superiorly with #1 Vicryl.  A medium Hemovac drain was placed.  The remainder of the wound was  closed with #1 Vicryl in the iliotibial band and gluteal fascia.  We  then closed the subcu layer with 2-0 Vicryl and a running 4-0 Monocryl.  The hip was cleaned, dried and dressed sterilely with Steri-Strips and a  sterile dressing.  He was then extubated and brought to the recovery  room in stable condition, tolerating the procedure well.      Madlyn Frankel Charlann Boxer, M.D.  Electronically Signed     MDO/MEDQ  D:  09/01/2008  T:  09/01/2008  Job:  [id number]

## 2010-08-23 NOTE — Discharge Summary (Signed)
NAMEJAQUAVIS, FELMLEE NO.:  [id number]   MEDICAL RECORD NO.:  [id number]          PATIENT TYPE:  INP   LOCATION:  1608                         FACILITY:  [facility]   PHYSICIAN:  Madlyn Frankel. Charlann Boxer, M.D.  DATE OF BIRTH:  [birth year]   DATE OF ADMISSION:  09/01/2008  DATE OF DISCHARGE:                               DISCHARGE SUMMARY   ADMITTING DIAGNOSIS:  Left hip degenerative joint disease.   DISCHARGE DIAGNOSES:  1. Left hip degenerative joint disease.  2. Reflux disease.  3. History of cataracts.   ADMITTING HISTORY:  Mr. Brandon [name] is a 60 year old gentleman admitted for  same day surgery on Sep 01, 2008.  He presented to the office for  evaluation of bilateral left greater than right hip pain with the workup  that included degenerative changes within the joint associated with some  degenerative labral pathology.  He had failed conservative measures and  we had a lengthy discussion around the pros and cons of treatment  options available.  At his age of 67 and the poor results from any  arthroscopic debridement about the hip with degenerative changes  present, he wished to proceed with hip arthroplasty of his joint.  Risks, benefits have been reviewed before the time of surgery.   PAST MEDICAL HISTORY:  1. Avascular necrosis which appears to be stable in his hips.  2. Reflux disease.  3. History of cataracts.   PAST SURGICAL HISTORY:  Hemorrhoid surgery.   FAMILY HISTORY:  Cancer in mother and father.   SOCIAL HISTORY:  He is married, as a daughter.  He smokes 7 cigars a day  and drinks 4 drinks a day.   MEDICATIONS:  Prilosec at home.   BRIEF HOSPITAL COURSE:  The patient was admitted for same day surgery on  Sep 01, 2008.  He underwent a left total hip replacement that was  uneventful.  Please see dictated operative note for full details of the  procedure.  Postoperatively, after a short stay in the recovery room, he  was brought to the orthopedic floor  where he remained for his hospital  stay.  His electrolytes and serum levels remained normal.  On  postoperative day #2, Sep 03, 2008, his hematocrit remained stable at  40.1 with a platelet level of 229,000.  His BUN and creatinine remained  stable.  His left hip wound was dry.  He had minimal swelling of the  left thigh.   He had been seen and evaluated by physical therapy and was ambulating in  the halls independently by postoperative day #2.   He, at this point, is deemed ready for discharge home for home health  physical therapy.   DISCHARGE INSTRUCTIONS:  The patient will be discharged to home with  weight bearing as tolerated using a walker.  He will keep his wound as  dry as possible for 7-10 days.  He will return to see Dr. Charlann Boxer in the  office, number [phone], in 10-12 days.  He will be monitored for hip  precautions with no flexion greater than 90 degrees and no internal  rotation.  This was reviewed with him.  He will continue to work on his  exercises and strengthening as described through home health therapy .   DISCHARGE MEDICATIONS:  The vast majority of his medications have been  prescribed preoperatively including Robaxin for muscle spasms and pain,  MiraLax, Colace for stool softener, iron for 2 weeks which at this point  he can probably stop after a week due to his stable hematocrit.   He was also given a prescription for Norco 5/325 mg one or two tablets  q.4-6 hours p.r.n. pain.  He will take aspirin 325 mg p.o. b.i.d. for  deep vein thrombosis prophylaxis in addition to his early activity.   He will contact the office for any questions or concerns he may have.  He was instructed to decrease the amount of cigar smoking with the  concerns for nicotine as relates to his prosthesis.  Instructions were  reviewed at the time of discharge and he knows he can contact the office  for any other questions or concerns.      Madlyn Frankel Charlann Boxer, M.D.  Electronically  Signed     MDO/MEDQ  D:  09/03/2008  T:  09/03/2008  Job:  [id number]

## 2010-08-23 NOTE — Op Note (Signed)
NAME:  Brandon [name], Brandon [name] NO.:  [id number]   MEDICAL RECORD NO.:  [id number]          PATIENT TYPE:  INP   LOCATION:  0004                         FACILITY:  [facility]   PHYSICIAN:  Madlyn Frankel. Charlann Boxer, M.D.  DATE OF BIRTH:  1951-03-02   DATE OF PROCEDURE:  09/01/2008  DATE OF DISCHARGE:                               OPERATIVE REPORT   PREOPERATIVE DIAGNOSES:  Left hip degenerative joint disease with  underlying degenerative changes associated with degenerative labral  pathology in a 60 year old male with history of chronic AVN that had  stabilized.   POSTOPERATIVE DIAGNOSES:  Left hip degenerative joint disease with  underlying degenerative changes associated with degenerative labral  pathology in a 60 year old male with history of chronic AVN that had  stabilized.   PROCEDURE:  Left total hip placement.   COMPONENTS USED:  The hip system size 50 pinnacle cup, single cancellous  screw, 36 metal liner, size 7 high trial lock stem with 36 1.5 metal  ball.   SURGEON:  Luna Fuse, M.D.   ASSISTANT:  Dwyane Luo, PA-C   ANESTHESIA:  General.   BLOOD LOSS:  200 mL.   DRAINS:  One.   COMPLICATIONS:  None.   FINDINGS:  None.   SPECIMENS:  None.   INDICATIONS FOR PROCEDURE:  Mr. Hosie is a 60 year old gentleman who  presented on referral for evaluation of left hip pain.  He had a  previous workup including plain films and MRI.  Plain films had  indicated perhaps some sclerotic change to the femoral head with some  maintenance of joint space was some narrowing.  MRI confirmed  degenerative labral pathology associated with some chronic stable  avascular changes with some joint space narrowing as well.  We worked  him up for degenerative changes and degenerative labral pathology could  not be addressed at this stage without progressive arthritis and early  failure.   He tried and failed cortisone injection.  Given the failure respond to  conservative measures, including  cortisone shots, we discussed surgical  intervention in the form of hip replacement.  Risks and benefits of the  procedure were discussed.  Consent was obtained after reviewing the  risks of infection, DVT, component failure, dislocation, and the need  for revision surgery.   PROCEDURE IN DETAIL:  The patient was brought to the operative theater.  Once adequate anesthesia, preoperative antibiotics, Ancef 2 grams  administered, the patient was positioned in the right lateral decubitus  position with the left side up.  We reclipped his skin and then pre  scrubbed, prepped and draped the left lateral hip.  A time-out was  performed identifying the patient, the planned procedure and extremity.   A lateral based incision was made for a posterior approach to the hip.  The iliotibial band and gluteal fascia were dissected out.  I then  incised this posteriorly.  The short external rotators were identified  and taken down as a single layer with the posterior capsule preserving  the attachment of the piriformis tendon.   At this point, the hip was dislocated and neck  osteotomy was made based  off anatomic landmarks, preoperative evaluation, and radiographs in  addition to placing a broach over the center of the femoral native head.  Following this, we examined the femoral head and found degenerative  changes, mainly in the posterior superior aspect of the femoral head  with significant fragmentation.   At this point, attention was then directed to the femur.  The femoral  canal was opened with a box osteotome to remove any remaining lateral  neck bone and then a starting drill and a hand reamer once and then  irrigated the canal to prevent fat emboli.  I then began broaching with  a size 1 broach and carried it up to a 7 broach which sat level with my  neck cut.   Given this,I went ahead and packed it with a sponge and attended to the  acetabulum.  Acetabular exposure was obtained and  I  removed the labrum.  I began reaming with a 48 reamer down to good cancellous subchondral  bone.  I impacted a 52 pinnacle cup.  It was sitting in approximately 35-  40 degrees of abduction, 20 degrees of forward flexion.  I placed a  single cancellous screw into the ilium and a trial liner was placed with  a 52 cup.  Trial reduction was now carried out with a 7 stem, standard  neck and a 36/1.5 ball.  Combined anteversion was 45-50 degrees.  There  was no evidence of impingement or subluxation throughout her range of  motion including internal rotation about 80 degrees.  Given these  parameters, all trial components were removed.  Final components were  opened.  A central hole liner was placed into the acetabular shell.  The  final 52 x 36 metal liner was impacted with good rim fit.  Final 7 high  trial lock stem was then opened and impacted at the level where the  broach was.  Based on my trial reduction, we chose a 36/1.5 ball which  was then impacted onto clean and dry trunnion and the hip was reduced.  Head stability  mimicked that of the trial reduction.  We had irrigated  the hip throughout the case and again at this point.   I reapproximated the posterior capsule superior capsular tissues using  #1 Vicryl.  The remaining wound was closed in layers over a medium  Hemovac drain with #1 Vicryl in the iliotibial band and gluteus fascia,  2-0 Vicryl in the subcu layer, 4-0 running Monocryl on the skin.  The  skin was cleaned, dried and dressed sterilely with Steri-Strips and a  Mepilex dressing.  He was then brought to the recovery room extubated  in stable condition tolerating the procedure well.      Madlyn Frankel Charlann Boxer, M.D.  Electronically Signed     MDO/MEDQ  D:  09/01/2008  T:  09/01/2008  Job:  [id number]

## 2010-08-23 NOTE — H&P (Signed)
NAME:  Brandon [name], Brandon [name]                ACCOUNT NO.:  [id number]   MEDICAL RECORD NO.:  [id number]          PATIENT TYPE:  INP   LOCATION:  NA                           FACILITY:  [facility]   PHYSICIAN:  Madlyn Frankel. Charlann Boxer, M.D.  DATE OF BIRTH:  1950/10/29   DATE OF ADMISSION:  DATE OF DISCHARGE:                              HISTORY & PHYSICAL   REASON FOR ADMISSION:  Left total hip replacement.   CHIEF COMPLAINTS:  Left hip pain.   HISTORY OF PRESENT ILLNESS:  A 60 year old male with a history of left  hip pain secondary to avascular necrosis.  He has had MRI confirming  this and with also AVN changes on the right.  Left has been the most  symptomatic.  It has been refractory to all conservative treatment.   PAST MEDICAL HISTORY:  1. Avascular necrosis.  2. Reflux disease.  3. Cataracts.   PAST SURGICAL HISTORY:  Hemorrhoid surgery.   FAMILY HISTORY:  Cancer, lymphoma.   SOCIAL HISTORY:  Is married.  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  Currently smokes 7  cigars per day.  Drinks 4 drinks per day.  Primary caregiver will be  family in the home.   DRUG ALLERGIES:  No known drug allergies.   CURRENT MEDICATIONS:  Prilosec 20 mg 1 p.o. daily.   REVIEW OF SYSTEMS:  MUSCULOSKELETAL:  He has joint pain.  Reports no  gastrointestinal upsets.  Otherwise see HPI.   PHYSICAL EXAMINATION:  Pulse 72, respirations 16, blood pressure 134/90.  GENERAL:  Awake and oriented.  HEENT:  Normocephalic.  NECK:  Supple.  No carotid bruits.  CHEST:  Lung sounds are clear to auscultation bilaterally.  BREASTS:  Deferred.  HEART:  S1-S2 distinct.  ABDOMEN:  Soft, nontender, bowel sounds present.  PELVIS:  Stable.  GENITOURINARY:  Deferred.  EXTREMITIES:  Left hip has increased pain with range of motion.  SKIN:  Intact.  No cellulitis.  NEUROLOGIC:  Intact distal sensibilities.   LABORATORY DATA:  Labs, EKG, chest x-ray all pending presurgical  testing.   IMPRESSION:  Left hip avascular necrosis.   PLAN OF ACTION:   Left total hip replacement by Dr. Charlann Boxer at [facility]  on Sep 01, 2008.  Risks and complications were discussed.  Including in  these risks are the fact that he does drink 4 drinks per day and has for  several years.  He may benefit from Ativan postoperatively.  In  addition, he will have increased risks of bleeding which would increased  the need for transfusion as well as hematoma.  This could then effect  wound healing and also increased the risk of need for future washout.  These topics were discussed in her history and physical with both the  patient and his wife.  He is planning on postoperative rehab in the  home.     ______________________________  Yetta Glassman. [address], [place]      Madlyn Frankel. Charlann Boxer, M.D.  Electronically Signed    BLM/MEDQ  D:  08/21/2008  T:  08/21/2008  Job:  [id number]

## 2010-09-22 ENCOUNTER — Ambulatory Visit
Admission: RE | Admit: 2010-09-22 | Discharge: 2010-09-22 | Disposition: A | Discharge status: Home / Self Care | Payer: 59 | Source: Ambulatory Visit | Attending: Radiation Oncology | Admitting: Radiation Oncology

## 2010-11-17 ENCOUNTER — Ambulatory Visit (HOSPITAL_COMMUNITY)
Admission: RE | Admit: 2010-11-17 | Discharge: 2010-11-17 | Disposition: A | Discharge status: Home / Self Care | Payer: 59 | Source: Ambulatory Visit | Attending: Oncology | Admitting: Oncology

## 2010-11-17 ENCOUNTER — Other Ambulatory Visit: Payer: Self-pay | Admitting: Oncology

## 2010-11-17 ENCOUNTER — Encounter (HOSPITAL_COMMUNITY): Payer: Self-pay

## 2010-11-17 ENCOUNTER — Encounter (HOSPITAL_BASED_OUTPATIENT_CLINIC_OR_DEPARTMENT_OTHER): Payer: 59 | Admitting: Oncology

## 2010-11-17 DIAGNOSIS — Z9221 Personal history of antineoplastic chemotherapy: Secondary | ICD-10-CM | POA: Insufficient documentation

## 2010-11-17 DIAGNOSIS — C099 Malignant neoplasm of tonsil, unspecified: Secondary | ICD-10-CM | POA: Insufficient documentation

## 2010-11-17 DIAGNOSIS — K110 Atrophy of salivary gland: Secondary | ICD-10-CM | POA: Insufficient documentation

## 2010-11-17 DIAGNOSIS — Z923 Personal history of irradiation: Secondary | ICD-10-CM | POA: Insufficient documentation

## 2010-11-17 DIAGNOSIS — R220 Localized swelling, mass and lump, head: Secondary | ICD-10-CM | POA: Insufficient documentation

## 2010-11-17 LAB — CMP (CANCER CENTER ONLY)
ALT(SGPT): 19 U/L (ref 10–47)
CO2: 31 mEq/L (ref 18–33)
Calcium: 9.5 mg/dL (ref 8.0–10.3)
Chloride: 98 mEq/L (ref 98–108)
Potassium: 4.7 mEq/L (ref 3.3–4.7)
Sodium: 147 mEq/L — ABNORMAL HIGH (ref 128–145)
Total Protein: 7.1 g/dL (ref 6.4–8.1)

## 2010-11-17 LAB — CBC WITH DIFFERENTIAL/PLATELET
BASO%: 0.6 % (ref 0.0–2.0)
HCT: 43.9 % (ref 38.4–49.9)
MCHC: 34.4 g/dL (ref 32.0–36.0)
MONO#: 0.4 10*3/uL (ref 0.1–0.9)
RBC: 4.77 10*6/uL (ref 4.20–5.82)
RDW: 15.4 % — ABNORMAL HIGH (ref 11.0–14.6)
WBC: 4.4 10*3/uL (ref 4.0–10.3)
lymph#: 0.8 10*3/uL — ABNORMAL LOW (ref 0.9–3.3)

## 2010-11-17 MED ORDER — IOHEXOL 300 MG/ML  SOLN
100.0000 mL | Freq: Once | INTRAMUSCULAR | Status: AC | PRN
  Administered 2010-11-17: 100 mL via INTRAVENOUS

## 2010-11-18 ENCOUNTER — Other Ambulatory Visit: Payer: Self-pay | Admitting: Oncology

## 2010-11-18 ENCOUNTER — Encounter (HOSPITAL_BASED_OUTPATIENT_CLINIC_OR_DEPARTMENT_OTHER): Payer: 59 | Admitting: Oncology

## 2010-11-18 DIAGNOSIS — C069 Malignant neoplasm of mouth, unspecified: Secondary | ICD-10-CM

## 2010-11-18 DIAGNOSIS — B977 Papillomavirus as the cause of diseases classified elsewhere: Secondary | ICD-10-CM

## 2010-11-18 DIAGNOSIS — C099 Malignant neoplasm of tonsil, unspecified: Secondary | ICD-10-CM

## 2011-02-23 ENCOUNTER — Other Ambulatory Visit: Payer: Self-pay | Admitting: *Deleted

## 2011-02-23 NOTE — Telephone Encounter (Signed)
Pts daughter requesting refill for ambein 10 mg last ov was Jan 12'

## 2011-02-24 NOTE — Telephone Encounter (Signed)
Ok for refill x 5 

## 2011-02-27 MED ORDER — ZOLPIDEM TARTRATE 10 MG PO TABS: 10.0000 mg | ORAL_TABLET | Freq: Every evening | ORAL | Status: DC | PRN

## 2011-03-03 ENCOUNTER — Other Ambulatory Visit: Payer: Self-pay | Admitting: *Deleted

## 2011-03-03 ENCOUNTER — Telehealth: Payer: Self-pay | Admitting: *Deleted

## 2011-03-17 NOTE — Telephone Encounter (Signed)
refill 

## 2011-03-30 ENCOUNTER — Ambulatory Visit: Payer: 59 | Admitting: Radiation Oncology

## 2011-04-20 ENCOUNTER — Encounter: Payer: Self-pay | Admitting: Radiation Oncology

## 2011-04-20 ENCOUNTER — Ambulatory Visit
Admission: RE | Admit: 2011-04-20 | Discharge: 2011-04-20 | Disposition: A | Discharge status: Home / Self Care | Payer: 59 | Source: Ambulatory Visit | Attending: Radiation Oncology | Admitting: Radiation Oncology

## 2011-04-20 ENCOUNTER — Telehealth: Payer: Self-pay | Admitting: Oncology

## 2011-04-20 DIAGNOSIS — C099 Malignant neoplasm of tonsil, unspecified: Secondary | ICD-10-CM

## 2011-04-20 DIAGNOSIS — C801 Malignant (primary) neoplasm, unspecified: Secondary | ICD-10-CM

## 2011-04-20 NOTE — Telephone Encounter (Signed)
Pt/wife came in today for feb/aug lb/fu appts. Pt given appts for feb lb/fu. Pt already has feb ct (2/11). Due to [facility] being out for most of aug (after 8/5) pt/wife made aware we will hold off on scheduling aug f/u utnil they see HH in feb. HH can gv instructions at that time as to when pt should come back for f/u due in aug. Also pt will be gone last wk in July.

## 2011-04-20 NOTE — Progress Notes (Signed)
HAS BEEN 2 YRS, HAS PROBLEMS WITH SWELLING.  HAS TO CLEAR THROAT A LOT AND HAS A HARD TIME SWALLOWING.  STILL HAVING TO EAT SOUP OR LIQUIDS AT TIMES.  WANTS TO TALK TO YOU ABOUT OTHER POSSIBILITIES TO ADDRESS THIS

## 2011-04-22 NOTE — Progress Notes (Signed)
CC:   Brandon [name], M.D. 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  PREVIOUS INTERVENTIONS:  Chemoradiation to a total dose of 70 Gy completed June 18, 2010.  INTERVAL SINCE TREATMENT:  Almost 2 years.  INTERVAL HISTORY:  Brandon [name] reports for follow-up today.  He is having less facial pain.  He states this happens only intermittently.  His neck swelling continues to be an issue.  He states it is greater in the morning.  He says this is associated with some feelings of fullness when he swallows.  He states his swallowing is unpredictable, but he is able to eat most things that he wants to except for a lot of meat.  His mouth gets dry after he eats too much meat.  He pretty much has a moist mouth and does not require drinking water during normal conversation.  He and his wife just returned from a trip sailing in the [place].  He had a negative CT of the neck in August.  He has a scheduled CT scan and follow-up with Dr. Gaylyn Rong in February.  PHYSICAL EXAMINATION:  Vital Signs:  His weight is up to 177 pounds. Temperature 97.9, pulse 64, blood pressure 130/83.  HEENT:  He does have a lot of edema on his left neck basically extending from his ear down to his clavicle.  This tissue is thick and difficult to examine but does not appear to be any palpable evidence of disease.  Examination of his oropharynx reveals a moist oropharyngeal mucosa.  No lesions noted within the oral cavity or oropharynx.  Indirect Mirror Examination:  The back of his left and right base of tongue looks great.  Palpation reveals no evidence of tumor recurrence in the left or right tonsillar fossas.  No evidence of cervical adenopathy on the right.  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  No evidence of disease.  RECOMMENDATIONS:  Brandon [name] is 2 years out from treatment.  He looks good.  He has follow-up with Dr. Gaylyn Rong next month and  which will be about 2 years from the end of his treatments.  I have released him from follow- up with me.  He has regular scheduled follow-up with his primary care physician as well as Dr. Gaylyn Rong.  I did refer him to physical therapy for treatment of this lymphedema at his request.    ______________________________ Lurline Hare, M.D. SW/MEDQ  D:  04/21/2011  T:  04/22/2011  Job:  [id number]

## 2011-04-24 ENCOUNTER — Other Ambulatory Visit: Payer: Self-pay

## 2011-04-24 MED ORDER — OMEPRAZOLE 20 MG PO CPDR: 20.0000 mg | DELAYED_RELEASE_CAPSULE | Freq: Every day | ORAL | Status: DC

## 2011-05-05 ENCOUNTER — Ambulatory Visit: Payer: 59 | Admitting: Physical Therapy

## 2011-05-08 ENCOUNTER — Ambulatory Visit: Payer: 59 | Attending: Radiation Oncology | Admitting: Physical Therapy

## 2011-05-08 DIAGNOSIS — IMO0001 Reserved for inherently not codable concepts without codable children: Secondary | ICD-10-CM | POA: Insufficient documentation

## 2011-05-08 DIAGNOSIS — I890 Lymphedema, not elsewhere classified: Secondary | ICD-10-CM | POA: Insufficient documentation

## 2011-05-15 ENCOUNTER — Encounter: Payer: 59 | Admitting: Physical Therapy

## 2011-05-22 ENCOUNTER — Ambulatory Visit: Payer: 59 | Attending: Radiation Oncology | Admitting: Physical Therapy

## 2011-05-22 ENCOUNTER — Ambulatory Visit (HOSPITAL_BASED_OUTPATIENT_CLINIC_OR_DEPARTMENT_OTHER): Payer: 59

## 2011-05-22 ENCOUNTER — Other Ambulatory Visit: Payer: Self-pay | Admitting: Oncology

## 2011-05-22 ENCOUNTER — Ambulatory Visit (HOSPITAL_COMMUNITY)
Admission: RE | Admit: 2011-05-22 | Discharge: 2011-05-22 | Disposition: A | Discharge status: Home / Self Care | Payer: 59 | Source: Ambulatory Visit | Attending: Oncology | Admitting: Oncology

## 2011-05-22 DIAGNOSIS — I890 Lymphedema, not elsewhere classified: Secondary | ICD-10-CM | POA: Insufficient documentation

## 2011-05-22 DIAGNOSIS — C069 Malignant neoplasm of mouth, unspecified: Secondary | ICD-10-CM

## 2011-05-22 DIAGNOSIS — Z923 Personal history of irradiation: Secondary | ICD-10-CM | POA: Insufficient documentation

## 2011-05-22 DIAGNOSIS — C801 Malignant (primary) neoplasm, unspecified: Secondary | ICD-10-CM

## 2011-05-22 DIAGNOSIS — D649 Anemia, unspecified: Secondary | ICD-10-CM

## 2011-05-22 DIAGNOSIS — Z9221 Personal history of antineoplastic chemotherapy: Secondary | ICD-10-CM | POA: Insufficient documentation

## 2011-05-22 DIAGNOSIS — IMO0001 Reserved for inherently not codable concepts without codable children: Secondary | ICD-10-CM | POA: Insufficient documentation

## 2011-05-22 DIAGNOSIS — C099 Malignant neoplasm of tonsil, unspecified: Secondary | ICD-10-CM | POA: Insufficient documentation

## 2011-05-22 LAB — CBC WITH DIFFERENTIAL/PLATELET
BASO%: 0.6 % (ref 0.0–2.0)
EOS%: 4.9 % (ref 0.0–7.0)
Eosinophils Absolute: 0.2 10*3/uL (ref 0.0–0.5)
MCH: 32.1 pg (ref 27.2–33.4)
MCHC: 34.7 g/dL (ref 32.0–36.0)
MCV: 92.4 fL (ref 79.3–98.0)
MONO%: 8.4 % (ref 0.0–14.0)
NEUT#: 2.5 10*3/uL (ref 1.5–6.5)
RBC: 4.94 10*6/uL (ref 4.20–5.82)
RDW: 13.9 % (ref 11.0–14.6)

## 2011-05-22 LAB — CMP (CANCER CENTER ONLY)
ALT(SGPT): 29 U/L (ref 10–47)
AST: 30 U/L (ref 11–38)
Albumin: 3.8 g/dL (ref 3.3–5.5)
Alkaline Phosphatase: 64 U/L (ref 26–84)
Potassium: 3.6 mEq/L (ref 3.3–4.7)
Sodium: 140 mEq/L (ref 128–145)
Total Protein: 7.5 g/dL (ref 6.4–8.1)

## 2011-05-22 MED ORDER — IOHEXOL 300 MG/ML  SOLN
100.0000 mL | Freq: Once | INTRAMUSCULAR | Status: AC | PRN
  Administered 2011-05-22: 100 mL via INTRAVENOUS

## 2011-05-24 ENCOUNTER — Telehealth: Payer: Self-pay | Admitting: Oncology

## 2011-05-24 ENCOUNTER — Ambulatory Visit (HOSPITAL_BASED_OUTPATIENT_CLINIC_OR_DEPARTMENT_OTHER): Payer: 59 | Admitting: Oncology

## 2011-05-24 ENCOUNTER — Ambulatory Visit: Payer: 59 | Admitting: Physical Therapy

## 2011-05-24 VITALS — BP 112/69 | HR 58 | Temp 98.3°F | Ht 68.0 in | Wt 177.0 lb

## 2011-05-24 DIAGNOSIS — R609 Edema, unspecified: Secondary | ICD-10-CM

## 2011-05-24 DIAGNOSIS — K117 Disturbances of salivary secretion: Secondary | ICD-10-CM

## 2011-05-24 DIAGNOSIS — R599 Enlarged lymph nodes, unspecified: Secondary | ICD-10-CM

## 2011-05-24 DIAGNOSIS — C801 Malignant (primary) neoplasm, unspecified: Secondary | ICD-10-CM

## 2011-05-24 DIAGNOSIS — R591 Generalized enlarged lymph nodes: Secondary | ICD-10-CM

## 2011-05-24 NOTE — Telephone Encounter (Signed)
Ct changed to just chest and md for 8/7 aom

## 2011-05-24 NOTE — Progress Notes (Signed)
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  PAST THERAPY:  He completed one cycle of every three week cisplatin which was given on 05/04/2009 along with daily radiation therapy, however, due to acute renal insufficiency with a creatinine of 5, he was switched to weekly carboplatin and Taxol starting on 05/26/2009.  He finished XRT on 06/17/09 and chemo on 06/16/09.  CURRENT THERAPY:  watchful observation.  INTERVAL HISTORY: HOMAR WEINKAUF 61 y.o. male returns for regular follow up with his wife.  He reports feeling relatively well.  He has submental edema and thus has been going to lymphedema clinic with improvement.  He has had intermittent swelling even of his left cervical node which has been spontaneously resolving.  He has moderate xerostomia and cannot eat much dry meat such as chicken or beef.  He has been working full time without fatigue.  His appetite is stable as is his weight.  He does not smoke or chew tobacco.  However, he still drinks about 3 shots of Burbon daily.    Patient denies fatigue, headache, visual changes, confusion, drenching night sweats, palpable lymph node swelling, mucositis, odynophagia, dysphagia, nausea vomiting, jaundice, chest pain, palpitation, shortness of breath, dyspnea on exertion, productive cough, gum bleeding, epistaxis, hematemesis, hemoptysis, abdominal pain, abdominal swelling, early satiety, melena, hematochezia, hematuria, skin rash, spontaneous bleeding, joint swelling, joint pain, heat or cold intolerance, bowel bladder incontinence, back pain, focal motor weakness, paresthesia, depression, suicidal or homocidal ideation, feeling hopelessness.   Past Medical History  Diagnosis Date  . Head and neck cancer     head and neck ca dx 03/2009  . oropharyngeal ca dx'd 03/2009    chemo/xrt comp 06/2009    Past Surgical History    Procedure Date  . Hip replacemnt left   . Stmach surgery     repair peg tube site    Current Outpatient Prescriptions  Medication Sig Dispense Refill  . ibuprofen (ADVIL,MOTRIN) 200 MG tablet Take 200 mg by mouth every 6 (six) hours as needed.      Marland Kitchen omeprazole (PRILOSEC) 20 MG capsule Take 1 capsule (20 mg total) by mouth daily.  90 capsule  3  . zolpidem (AMBIEN) 10 MG tablet Take 10 mg by mouth at bedtime as needed.        ALLERGIES:   has no known allergies.  REVIEW OF SYSTEMS:  The rest of the 14-point review of system was negative.   Filed Vitals:   05/24/11 0847  BP: 112/69  Pulse: 58  Temp: 98.3 F (36.8 C)   Wt Readings from Last 3 Encounters:  05/24/11 177 lb (80.287 kg)  11/18/10 175 lb 5 oz (79.521 kg)  04/20/11 177 lb 4.8 oz (80.423 kg)   ECOG Performance status: 0  PHYSICAL EXAMINATION:   General:  well-nourished in no acute distress.  Eyes:  no scleral icterus.  ENT:  There were no oropharyngeal lesions.  Neck was without thyromegaly. There was slight submental edema without discrete palpable mass.   Lymphatics:  Negative cervical, supraclavicular or axillary adenopathy.  Respiratory: lungs were clear bilaterally without wheezing or crackles.  Cardiovascular:  Regular rate and rhythm, S1/S2, without murmur, rub or gallop.  There was no pedal edema.  GI:  abdomen was soft, flat, nontender, nondistended, without organomegaly.  Muscoloskeletal:  no spinal tenderness of palpation of vertebral spine.  Skin exam  was without echymosis, petichae.  Neuro exam was nonfocal.  Patient was able to get on and off exam table without assistance.  Gait was normal.  Patient was alerted and oriented.  Attention was good.   Language was appropriate.  Mood was normal without depression.  Speech was not pressured.  Thought content was not tangential.         LABORATORY/RADIOLOGY DATA:  Lab Results  Component Value Date   WBC 3.9* 05/22/2011   HGB 15.8 05/22/2011   HCT 45.6  05/22/2011   PLT 271 05/22/2011   GLUCOSE 109 05/22/2011   CHOL 195 04/07/2010   TRIG 102.0 04/07/2010   HDL 49.10 04/07/2010   LDLDIRECT 147.7 12/03/2007   LDLCALC 126* 04/07/2010   ALT 15 04/28/2010   AST 30 05/22/2011   NA 140 05/22/2011   K 3.6 05/22/2011   CL 99 05/22/2011   CREATININE 0.9 05/22/2011   BUN 22 05/22/2011   CO2 29 05/22/2011   TSH 5.301* 05/22/2011   PSA 0.61 04/07/2010   INR 1.0 09/01/2008    IMAGING:  I personally reviewed the following CT and showed the patient and his wife the images.  There was no sign of recurrent disease.   Ct Soft Tissue Neck W Contrast  05/22/2011  *RADIOLOGY REPORT*  Clinical Data: Followup tonsillar cancer post chemotherapy and radiation therapy.  CT NECK WITH CONTRAST  Technique:  Multidetector CT imaging of the neck was performed with intravenous contrast.  Contrast: OMNIPAQUE IOHEXOL 300 MG/ML IV SOLN  Comparison: 11/17/2010 CT.  Findings: Post-therapy type changes left neck including soft tissue prominence surrounding the carotid space and bordering the posterior aspect of the left submandibular gland appears similar to the prior examination.  Minimal asymmetry of the palatine tonsils with slight fullness on the left unchanged.  Minimal increase in small mediastinal lymph nodes.  Upper right paratracheal lymph node now measures 12 x 0.7 cm and previously measured 11 x 6 mm (series 2 image 75).  Attention to this on follow-up.  Mild underfilling of the left piriform sinus without significant change.  Thickening of the epiglottis and aryepiglottic folds stable.  Atherosclerotic type changes most notable proximal left internal carotid artery with complex plaque without significant change.  Parenchymal changes lung apices without change probably related to prior radiation therapy.  Cervical spondylotic changes.  IMPRESSION: Post-therapy type changes left neck without significant change as detailed above.  Original Report Authenticated By: Fuller Canada, M.D.       ASSESSMENT AND PLAN:    1. History of oropharyngeal squamous cell carcinoma:  I discussed with Mr. Lingard and his wife that he has no evidence of recurrent or metastatic disease today.  2. Surveillance:  As he is about 2 years ago, surveillance will now be clinical/exam and not routine neck CT for surveillance.  I advised him to follow up with [place] in 6 months and also with ENT and Rad Onc.  3. Superior mediastinal adenopathies:  non-pathologic appearance.  Per recommendation by radiologist, we will follow up with a CT of the chest in August 2013 for stability.  4. Alcohol use:  I again advised him to refrain from this EtOH to reduce the risk of recurrence.  He is not ready to quit now.  5. Xerostomia.  Stable.  6. Fibrosis/submentaledema:  He is attending lymphedema clinic.

## 2011-05-29 ENCOUNTER — Ambulatory Visit: Payer: 59 | Admitting: Physical Therapy

## 2011-05-31 ENCOUNTER — Encounter: Payer: 59 | Admitting: Physical Therapy

## 2011-06-05 ENCOUNTER — Encounter: Payer: 59 | Admitting: Physical Therapy

## 2011-06-07 ENCOUNTER — Encounter: Payer: 59 | Admitting: Physical Therapy

## 2011-06-12 ENCOUNTER — Encounter: Payer: 59 | Admitting: Physical Therapy

## 2011-06-14 ENCOUNTER — Encounter: Payer: 59 | Admitting: Physical Therapy

## 2011-08-29 ENCOUNTER — Telehealth: Payer: Self-pay

## 2011-08-29 MED ORDER — ZOLPIDEM TARTRATE 10 MG PO TABS: 10.0000 mg | ORAL_TABLET | Freq: Every evening | ORAL | Status: DC | PRN

## 2011-08-29 NOTE — Telephone Encounter (Signed)
Rx SENT TO [facility] WITH MESSAGE TO HAVE PATIENT MAKE APPT. WITH MD FOR FURTHER REFILLS

## 2011-08-29 NOTE — Telephone Encounter (Signed)
To Fannie Knee, LPN

## 2011-08-29 NOTE — Telephone Encounter (Signed)
OK to fill this prescription with additional refills x0 Sch OV w/Dr Norins Thank you!  

## 2011-08-29 NOTE — Telephone Encounter (Signed)
Addended by: Elnora Morrison on: 08/29/2011 02:38 PM   Modules accepted: Orders

## 2011-08-29 NOTE — Telephone Encounter (Signed)
Pt's spouse called requesting refill of Ambien, last OV 04/28/2010. Please advise on this Dr Debby Bud' pt, thanks!

## 2011-09-05 ENCOUNTER — Telehealth: Payer: Self-pay | Admitting: Internal Medicine

## 2011-09-05 NOTE — Telephone Encounter (Signed)
Caller: Kimberly/Spouse; PCP: Illene Regulus; CB#: [phone];  Call regarding Refill Request; States she called approx a week ago regarding refill request on Ambien 10 mg. States  she has tried Psychiatrist and [facility] will not fax refill request since it is controlled substance.  Caller asks that Rx be sent to [facility] for 90 supply.  [facility] phone number is [phone].  Advised caller to check with pharmacy in a few days to see if Rx. Was rec'd.  Medication Questions protocol used.

## 2011-09-05 NOTE — Telephone Encounter (Signed)
Ok for refill x 5 

## 2011-09-07 MED ORDER — ZOLPIDEM TARTRATE 10 MG PO TABS: 10.0000 mg | ORAL_TABLET | Freq: Every evening | ORAL | Status: DC | PRN

## 2011-09-07 NOTE — Telephone Encounter (Signed)
Rx printed, signed and faxed to [facility].

## 2011-11-13 ENCOUNTER — Telehealth: Payer: Self-pay | Admitting: *Deleted

## 2011-11-13 ENCOUNTER — Ambulatory Visit (HOSPITAL_COMMUNITY)
Admission: RE | Admit: 2011-11-13 | Discharge: 2011-11-13 | Disposition: A | Discharge status: Home / Self Care | Payer: 59 | Source: Ambulatory Visit | Attending: Oncology | Admitting: Oncology

## 2011-11-13 ENCOUNTER — Other Ambulatory Visit: Payer: Self-pay | Admitting: Oncology

## 2011-11-13 ENCOUNTER — Other Ambulatory Visit (HOSPITAL_COMMUNITY): Payer: 59

## 2011-11-13 ENCOUNTER — Other Ambulatory Visit (HOSPITAL_BASED_OUTPATIENT_CLINIC_OR_DEPARTMENT_OTHER): Payer: 59

## 2011-11-13 DIAGNOSIS — C099 Malignant neoplasm of tonsil, unspecified: Secondary | ICD-10-CM

## 2011-11-13 DIAGNOSIS — J984 Other disorders of lung: Secondary | ICD-10-CM | POA: Insufficient documentation

## 2011-11-13 DIAGNOSIS — R599 Enlarged lymph nodes, unspecified: Secondary | ICD-10-CM

## 2011-11-13 DIAGNOSIS — R591 Generalized enlarged lymph nodes: Secondary | ICD-10-CM

## 2011-11-13 DIAGNOSIS — C801 Malignant (primary) neoplasm, unspecified: Secondary | ICD-10-CM

## 2011-11-13 LAB — CMP (CANCER CENTER ONLY)
ALT(SGPT): 23 U/L (ref 10–47)
BUN, Bld: 27 mg/dL — ABNORMAL HIGH (ref 7–22)
CO2: 29 mEq/L (ref 18–33)
Calcium: 9.3 mg/dL (ref 8.0–10.3)
Creat: 1 mg/dl (ref 0.6–1.2)
Total Bilirubin: 0.9 mg/dl (ref 0.20–1.60)

## 2011-11-13 LAB — CBC WITH DIFFERENTIAL/PLATELET
BASO%: 1.2 % (ref 0.0–2.0)
Basophils Absolute: 0.1 10*3/uL (ref 0.0–0.1)
EOS%: 2.1 % (ref 0.0–7.0)
HCT: 41.7 % (ref 38.4–49.9)
HGB: 14.6 g/dL (ref 13.0–17.1)
LYMPH%: 19.6 % (ref 14.0–49.0)
MCH: 32.3 pg (ref 27.2–33.4)
MCHC: 35 g/dL (ref 32.0–36.0)
MONO#: 0.4 10*3/uL (ref 0.1–0.9)
NEUT%: 68.4 % (ref 39.0–75.0)
Platelets: 252 10*3/uL (ref 140–400)

## 2011-11-13 LAB — TSH: TSH: 5.507 u[IU]/mL — ABNORMAL HIGH (ref 0.350–4.500)

## 2011-11-13 MED ORDER — IOHEXOL 300 MG/ML  SOLN
80.0000 mL | Freq: Once | INTRAMUSCULAR | Status: AC | PRN
  Administered 2011-11-13: 80 mL via INTRAVENOUS

## 2011-11-13 NOTE — Telephone Encounter (Signed)
Patient has an order for Ct chest but a form that reads CT neck and chest.  Reviewed orders and a note to [facility] schedulers reads "Cancel neck, I only need CT chest".  This information given to [facility].  Only CT chest will be performed today.

## 2011-11-15 ENCOUNTER — Telehealth: Payer: Self-pay | Admitting: Oncology

## 2011-11-15 ENCOUNTER — Ambulatory Visit (HOSPITAL_BASED_OUTPATIENT_CLINIC_OR_DEPARTMENT_OTHER): Payer: 59 | Admitting: Oncology

## 2011-11-15 VITALS — BP 117/76 | HR 58 | Temp 96.9°F | Resp 18 | Ht 68.0 in | Wt 169.8 lb

## 2011-11-15 DIAGNOSIS — B977 Papillomavirus as the cause of diseases classified elsewhere: Secondary | ICD-10-CM

## 2011-11-15 DIAGNOSIS — R599 Enlarged lymph nodes, unspecified: Secondary | ICD-10-CM

## 2011-11-15 DIAGNOSIS — C099 Malignant neoplasm of tonsil, unspecified: Secondary | ICD-10-CM

## 2011-11-15 DIAGNOSIS — F1010 Alcohol abuse, uncomplicated: Secondary | ICD-10-CM

## 2011-11-15 DIAGNOSIS — C801 Malignant (primary) neoplasm, unspecified: Secondary | ICD-10-CM

## 2011-11-15 NOTE — Patient Instructions (Addendum)
A.  Result of CT chest 11/13/2011:  No evidence for axillary lymphadenopathy. 7 mm short-axis high  right paratracheal lymph node is stable. Index AP window lymph  node measured previously at 9 mm measures 10 mm on today's study.  Index 9 mm short-axis prevascular lymph node on the previous study  is 7 mm in short axis today. Other small scattered mediastinal  lymph nodes are unchanged. There is no hilar lymphadenopathy.  Heart size is normal. No pericardial or pleural effusion.  Lung windows show some irregular pleural thickening adjacent to the  transverse aorta, a finding which is stable in the interval.  Biapical pleuroparenchymal scarring is also unchanged. No  parenchymal nodular mass. No focal airspace consolidation.  Bone windows reveal no worrisome lytic or sclerotic osseous  lesions. 10 mm right adrenal nodule is stable, likely reflecting an  adrenal adenoma. Incompletely visualized large upper pole left  renal cyst is again noted.   IMPRESSION:   Stable exam. Scattered small mediastinal lymph nodes are stable.  No new or progressive interval findings to suggest metastatic disease.   B.  Follow up:  [facility] that [facility]  Please also follow up with your Ear/Nose/Throat and Radiation doctors during the year.

## 2011-11-15 NOTE — Progress Notes (Signed)
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   PAST THERAPY: He completed one cycle of every three week cisplatin which was given on 05/04/2009 along with daily radiation therapy, however, due to acute renal insufficiency with a creatinine of 5, he was switched to weekly carboplatin and Taxol starting on 05/26/2009. He finished XRT on 06/17/09 and chemo on 06/16/09.   CURRENT THERAPY: watchful observation.  INTERVAL HISTORY: Brandon [name] 61 y.o. male returns for regular follow up with his wife.  He reported that he still has submental edema.  He has not been very adherent with daily masagge therapy as instructed.  He has intermittent left hip pain from history of DJD.  The pain is crampy, mild; no radiating, not requiring regular pain med.  He works full time.  He does not smoke cigarettes or chew tobacco.  However, he still drinks about 3 glasses of wine daily.  He does not want to hear anymore about high intake of EtOH and recurrent head/neck cancer.   Patient denies fever, anorexia, weight loss, fatigue, headache, visual changes, confusion, drenching night sweats, palpable lymph node swelling, mucositis, odynophagia, dysphagia, nausea vomiting, jaundice, chest pain, palpitation, shortness of breath, dyspnea on exertion, productive cough, gum bleeding, epistaxis, hematemesis, hemoptysis, abdominal pain, abdominal swelling, early satiety, melena, hematochezia, hematuria, skin rash, spontaneous bleeding, joint swelling, heat or cold intolerance, bowel bladder incontinence,focal motor weakness, paresthesia, depression, suicidal or homicidal ideation, feeling hopelessness.   Past Medical History  Diagnosis Date  . Head and neck cancer     head and neck ca dx 03/2009  . oropharyngeal ca dx'd 03/2009    chemo/xrt comp 06/2009     Past Surgical History  Procedure Date  . Hip replacemnt left   . Stmach surgery     repair peg tube site    Current Outpatient Prescriptions  Medication Sig Dispense Refill  . ibuprofen (ADVIL,MOTRIN) 200 MG tablet Take 200 mg by mouth every 6 (six) hours as needed.      Marland Kitchen omeprazole (PRILOSEC) 20 MG capsule Take 1 capsule (20 mg total) by mouth daily.  90 capsule  3  . zolpidem (AMBIEN) 10 MG tablet Take 1 tablet (10 mg total) by mouth at bedtime as needed for sleep.  90 tablet  1  . zolpidem (AMBIEN) 10 MG tablet Take 1 tablet (10 mg total) by mouth at bedtime as needed. PATIENT NEEDS TO MAKE APPT. WITH MD FOR FURTHER REFILLS.  90 tablet  1    ALLERGIES:   has no known allergies.  REVIEW OF SYSTEMS:  The rest of the 14-point review of system was negative.   Filed Vitals:   11/15/11 0932  BP: 117/76  Pulse: 58  Temp: 96.9 F (36.1 C)  Resp: 18   Wt Readings from Last 3 Encounters:  11/15/11 169 lb 12.8 oz (77.021 kg)  05/24/11 177 lb (80.287 kg)  11/18/10 175 lb 5 oz (79.521 kg)   ECOG Performance status: 0-1  PHYSICAL EXAMINATION:   General: well-nourished man in no acute distress. Eyes: no scleral icterus. ENT: There were no oropharyngeal lesions. Neck was without thyromegaly. There was slight submental edema without discrete palpable mass. Lymphatics: Negative cervical, supraclavicular or axillary adenopathy. Respiratory: lungs were clear bilaterally without wheezing or crackles. Cardiovascular: Regular rate and rhythm, S1/S2, without murmur, rub  or gallop. There was no pedal edema. GI: abdomen was soft, flat, nontender, nondistended, without organomegaly. Muscoloskeletal: no spinal tenderness of palpation of vertebral spine. Skin exam was without echymosis, petichae. Neuro exam was nonfocal. Patient was able to get on and off exam table without assistance. Gait was normal. Patient was alerted and oriented. Attention was good. Language was appropriate. Mood was normal  without depression. Speech was not pressured. Thought content was not tangential.    LABORATORY/RADIOLOGY DATA:  Lab Results  Component Value Date   WBC 4.4 11/13/2011   HGB 14.6 11/13/2011   HCT 41.7 11/13/2011   PLT 252 11/13/2011   GLUCOSE 101 11/13/2011   CHOL 195 04/07/2010   TRIG 102.0 04/07/2010   HDL 49.10 04/07/2010   LDLDIRECT 147.7 12/03/2007   LDLCALC 126* 04/07/2010   ALKPHOS 60 11/13/2011   ALT 15 04/28/2010   AST 27 11/13/2011   NA 143 11/13/2011   K 3.6 11/13/2011   CL 102 11/13/2011   CREATININE 1.0 11/13/2011   BUN 27* 11/13/2011   CO2 29 11/13/2011   PSA 0.61 04/07/2010   INR 1.0 09/01/2008   IMAGING:  I personally reviewed the following CT and showed the patient and his wife the images:  Ct Chest W Contrast  11/13/2011  *RADIOLOGY REPORT*  Clinical Data: Tonsillar cancer with intrathoracic nodal involvement.  CT CHEST WITH CONTRAST  Technique:  Multidetector CT imaging of the chest was performed following the standard protocol during bolus administration of intravenous contrast.  Contrast: 80mL OMNIPAQUE IOHEXOL 300 MG/ML  SOLN  Comparison: 11/17/2010  Findings: No evidence for axillary lymphadenopathy.  7 mm short-axis high right paratracheal lymph node is stable.  Index AP window lymph node measured previously at 9 mm measures 10 mm on today's study. Index 9 mm short-axis prevascular lymph node on the previous study is 7 mm in short axis today.  Other small scattered mediastinal lymph nodes are unchanged.  There is no hilar lymphadenopathy. Heart size is normal.  No pericardial or pleural effusion.  Lung windows show some irregular pleural thickening adjacent to the transverse aorta, a finding which is stable in the interval. Biapical pleuroparenchymal scarring is also unchanged. No parenchymal nodular mass.  No focal airspace consolidation.  Bone windows reveal no worrisome lytic or sclerotic osseous lesions. 10 mm right adrenal nodule is stable, likely reflecting an adrenal adenoma.   Incompletely visualized large upper pole left renal cyst is again noted.  IMPRESSION: Stable exam. Scattered small mediastinal lymph nodes are stable. No new or progressive interval findings to suggest metastatic disease.  Original Report Authenticated By: ERIC A. MANSELL, M.D.    ASSESSMENT AND PLAN:    1. History of oropharyngeal squamous cell carcinoma: I discussed with Mr. Brandon [name] and his wife that he has no evidence of recurrent or metastatic disease today.  As he is more than two years out from the finish of therapy.  There is no indication for routine surveillance neck CT.  In the future, if he develops concerning symptoms or he has abnormal finding on ENT exam, we may consider repeat imaging.  2. Surveillance:  I advised him to follow up with [place] in 12 months and also with ENT and Rad Onc in the interval.  3. Superior mediastinal adenopathies: non-pathologic appearance. Stable on follow up CT chest.  There is again no indication for repeat surveillance CT chest unless he has concerning symptoms.  4. Alcohol use: I again advised him to refrain from this EtOH to reduce the  risk of recurrence. He is not ready to quit now.  5. Xerostomia. Stable.  6. Fibrosis/submentaledema: He already attended lymphedema clinic.  I advised him on more frequent massaging exercise.          The length of time of the face-to-face encounter was 15 minutes. More than 50% of time was spent counseling and coordination of care.

## 2011-11-15 NOTE — Telephone Encounter (Signed)
appts made and printed for pt aom °

## 2012-02-08 ENCOUNTER — Ambulatory Visit: Payer: 59

## 2012-03-11 ENCOUNTER — Encounter: Payer: Self-pay | Admitting: Internal Medicine

## 2012-03-11 ENCOUNTER — Ambulatory Visit (INDEPENDENT_AMBULATORY_CARE_PROVIDER_SITE_OTHER): Payer: 59 | Admitting: Internal Medicine

## 2012-03-11 VITALS — BP 128/78 | HR 78 | Temp 97.5°F | Resp 8 | Wt 172.0 lb

## 2012-03-11 DIAGNOSIS — L608 Other nail disorders: Secondary | ICD-10-CM

## 2012-03-11 DIAGNOSIS — L609 Nail disorder, unspecified: Secondary | ICD-10-CM

## 2012-03-11 MED ORDER — OMEPRAZOLE 40 MG PO CPDR: 20.0000 mg | DELAYED_RELEASE_CAPSULE | Freq: Every day | ORAL | Status: DC

## 2012-03-11 MED ORDER — ZOLPIDEM TARTRATE 10 MG PO TABS: 10.0000 mg | ORAL_TABLET | Freq: Every evening | ORAL | Status: DC | PRN

## 2012-03-11 NOTE — Patient Instructions (Addendum)
Split great nail at risk for spontaneous traumatic avulsion.  Plan Referral to Dr. Harriet Pho, podiatrist.   Rx's reordered: omeprazole 40 mg once a day; zolpidem 10 mg qHS

## 2012-03-11 NOTE — Progress Notes (Signed)
  Subjective:    Patient ID: Brandon [name], male    DOB: [birth year], 61 y.o.   MRN: [id number]  HPI Mr. Brandon [name] has a long standing of split great toenails but recently after snorkling trip to the [place] the nail split more so that it is loosened and at risk for traumatic avulsion. No evidenc of infection: no pain, fever, swelling.  Past Medical History  Diagnosis Date  . Head and neck cancer     head and neck ca dx 03/2009  . oropharyngeal ca dx'd 03/2009    chemo/xrt comp 06/2009   Past Surgical History  Procedure Date  . Hip replacemnt left   . Stmach surgery     repair peg tube site   Family History  Problem Relation Age of Onset  . Cancer Mother     lymphoma  . Cancer Father     lung (smoker)  . Hyperlipidemia Father   . Hypertension Father   . Diabetes Father    History   Social History  . Marital Status: Married    Spouse Name: N/A    Number of Children: 2  . 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 Smoking status: Former Smoker    Types: Cigarettes, Cigars    Quit date: 04/19/2009  . Smokeless tobacco: Never Used  . Alcohol Use: Yes  . Drug Use: No  . Sexually Active: Yes -- Male partner(s)   Other Topics Concern  . Not on file   Social History Narrative   HSG, [employer]-no degree. married - '76 - 5 years, divorced; remarried  '84. 1 son - '85; 1 daughter '89. 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       Review of Systems System review is negative for any constitutional, cardiac, pulmonary, GI or neuro symptoms or complaints other than as described in the HPI.     Objective:   Physical Exam Filed Vitals:   03/11/12 1555  BP: 128/78  Pulse: 78  Temp: 97.5 F (36.4 C)  Resp: 8   Gen'l- WNWD Brandon [name] man in no distress HEENT - C&S clear Cor - RRR Pulm - normal respirations Derm - right great nail split down the middle almost all the way to the nail bed. The leaflets are  very loose. Left great toenail with central ridge       Assessment & Plan:  Loosened toenail - at risk for traumatic avulsion  Plan  refer to podiatry, Dr. Harriet Pho, for controlled nail avulsion and possible nailbed ablation.

## 2012-03-12 ENCOUNTER — Encounter: Payer: Self-pay | Admitting: Internal Medicine

## 2012-05-29 NOTE — Progress Notes (Signed)
Received office notes from Dr. Dwight Bates @ [facility]; forwarded to Dr. Ha. 

## 2012-08-21 ENCOUNTER — Ambulatory Visit: Payer: Self-pay | Admitting: Nurse Practitioner

## 2012-10-07 ENCOUNTER — Other Ambulatory Visit: Payer: Self-pay | Admitting: *Deleted

## 2012-10-07 MED ORDER — ZOLPIDEM TARTRATE 10 MG PO TABS: 10.0000 mg | ORAL_TABLET | Freq: Every evening | ORAL | Status: DC | PRN

## 2012-10-07 NOTE — Telephone Encounter (Signed)
Medication refilled and sent to [facility].

## 2012-10-30 ENCOUNTER — Ambulatory Visit (INDEPENDENT_AMBULATORY_CARE_PROVIDER_SITE_OTHER)
Admission: RE | Admit: 2012-10-30 | Discharge: 2012-10-30 | Disposition: A | Discharge status: Home / Self Care | Payer: 59 | Source: Ambulatory Visit | Attending: Internal Medicine | Admitting: Internal Medicine

## 2012-10-30 ENCOUNTER — Encounter: Payer: Self-pay | Admitting: Internal Medicine

## 2012-10-30 ENCOUNTER — Ambulatory Visit (INDEPENDENT_AMBULATORY_CARE_PROVIDER_SITE_OTHER): Payer: 59 | Admitting: Internal Medicine

## 2012-10-30 VITALS — BP 110/80 | HR 70 | Temp 98.1°F | Wt 170.8 lb

## 2012-10-30 DIAGNOSIS — M25552 Pain in left hip: Secondary | ICD-10-CM

## 2012-10-30 DIAGNOSIS — M25559 Pain in unspecified hip: Secondary | ICD-10-CM

## 2012-10-30 DIAGNOSIS — M549 Dorsalgia, unspecified: Secondary | ICD-10-CM

## 2012-10-30 NOTE — Patient Instructions (Addendum)
Come back for hip films at your convenience. Did have a normal exam today.

## 2012-10-31 ENCOUNTER — Ambulatory Visit (INDEPENDENT_AMBULATORY_CARE_PROVIDER_SITE_OTHER)
Admission: RE | Admit: 2012-10-31 | Discharge: 2012-10-31 | Disposition: A | Discharge status: Home / Self Care | Payer: 59 | Source: Ambulatory Visit | Attending: Internal Medicine | Admitting: Internal Medicine

## 2012-10-31 DIAGNOSIS — M549 Dorsalgia, unspecified: Secondary | ICD-10-CM

## 2012-10-31 DIAGNOSIS — M25559 Pain in unspecified hip: Secondary | ICD-10-CM

## 2012-10-31 DIAGNOSIS — M25552 Pain in left hip: Secondary | ICD-10-CM
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
## 2012-11-01 ENCOUNTER — Encounter: Payer: Self-pay | Admitting: Internal Medicine

## 2012-11-13 ENCOUNTER — Telehealth: Payer: Self-pay | Admitting: Hematology and Oncology

## 2012-11-13 ENCOUNTER — Ambulatory Visit (HOSPITAL_BASED_OUTPATIENT_CLINIC_OR_DEPARTMENT_OTHER): Payer: 59 | Admitting: Hematology and Oncology

## 2012-11-13 ENCOUNTER — Other Ambulatory Visit (HOSPITAL_BASED_OUTPATIENT_CLINIC_OR_DEPARTMENT_OTHER): Payer: 59 | Admitting: Lab

## 2012-11-13 VITALS — BP 135/84 | HR 65 | Temp 98.6°F | Resp 20 | Ht 68.0 in | Wt 171.3 lb

## 2012-11-13 DIAGNOSIS — B977 Papillomavirus as the cause of diseases classified elsewhere: Secondary | ICD-10-CM

## 2012-11-13 DIAGNOSIS — C801 Malignant (primary) neoplasm, unspecified: Secondary | ICD-10-CM

## 2012-11-13 DIAGNOSIS — Z923 Personal history of irradiation: Secondary | ICD-10-CM

## 2012-11-13 DIAGNOSIS — C099 Malignant neoplasm of tonsil, unspecified: Secondary | ICD-10-CM

## 2012-11-13 LAB — COMPREHENSIVE METABOLIC PANEL (CC13)
ALT: 16 U/L (ref 0–55)
Alkaline Phosphatase: 63 U/L (ref 40–150)
CO2: 24 mEq/L (ref 22–29)
Potassium: 3.5 mEq/L (ref 3.5–5.1)
Sodium: 143 mEq/L (ref 136–145)
Total Bilirubin: 0.59 mg/dL (ref 0.20–1.20)
Total Protein: 7.4 g/dL (ref 6.4–8.3)

## 2012-11-13 LAB — CBC WITH DIFFERENTIAL/PLATELET
Eosinophils Absolute: 0 10*3/uL (ref 0.0–0.5)
MCV: 90.4 fL (ref 79.3–98.0)
MONO%: 6.8 % (ref 0.0–14.0)
NEUT#: 4.5 10*3/uL (ref 1.5–6.5)
RBC: 4.91 10*6/uL (ref 4.20–5.82)
RDW: 14.7 % — ABNORMAL HIGH (ref 11.0–14.6)
WBC: 5.9 10*3/uL (ref 4.0–10.3)
lymph#: 1 10*3/uL (ref 0.9–3.3)

## 2012-11-13 LAB — TSH CHCC: TSH: 2.835 m(IU)/L (ref 0.320–4.118)

## 2012-11-13 NOTE — Telephone Encounter (Signed)
gv and printed appts sched and avs for pt.
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
## 2013-02-09 ENCOUNTER — Other Ambulatory Visit: Payer: Self-pay | Admitting: Internal Medicine

## 2013-02-13 ENCOUNTER — Other Ambulatory Visit: Payer: Self-pay

## 2013-03-14 ENCOUNTER — Telehealth: Payer: Self-pay | Admitting: Internal Medicine

## 2013-03-14 MED ORDER — ZOLPIDEM TARTRATE 10 MG PO TABS: 10.0000 mg | ORAL_TABLET | Freq: Every evening | ORAL | Status: DC | PRN

## 2013-03-14 NOTE — Telephone Encounter (Signed)
Script done and faxed to [facility]

## 2013-03-14 NOTE — Telephone Encounter (Signed)
[place] [place] 

## 2013-03-14 NOTE — Telephone Encounter (Signed)
Pt request refill for Ambien 10 mg for 90 days supply [facility]. Please advise.

## 2013-05-16 ENCOUNTER — Encounter: Payer: Self-pay | Admitting: Internal Medicine

## 2013-05-21 ENCOUNTER — Other Ambulatory Visit: Payer: Self-pay | Admitting: Otolaryngology

## 2013-05-21 DIAGNOSIS — R911 Solitary pulmonary nodule: Secondary | ICD-10-CM

## 2013-05-21 DIAGNOSIS — Z85818 Personal history of malignant neoplasm of other sites of lip, oral cavity, and pharynx: Secondary | ICD-10-CM

## 2013-05-26 ENCOUNTER — Ambulatory Visit
Admission: RE | Admit: 2013-05-26 | Discharge: 2013-05-26 | Disposition: A | Discharge status: Home / Self Care | Payer: 59 | Source: Ambulatory Visit | Attending: Otolaryngology | Admitting: Otolaryngology

## 2013-05-26 DIAGNOSIS — R911 Solitary pulmonary nodule: Secondary | ICD-10-CM

## 2013-05-26 DIAGNOSIS — Z85818 Personal history of malignant neoplasm of other sites of lip, oral cavity, and pharynx: Secondary | ICD-10-CM

## 2013-05-26 MED ORDER — IOHEXOL 300 MG/ML  SOLN
75.0000 mL | Freq: Once | INTRAMUSCULAR | Status: AC | PRN
  Administered 2013-05-26: 75 mL via INTRAVENOUS

## 2013-06-10 ENCOUNTER — Encounter: Payer: Self-pay | Admitting: Internal Medicine

## 2013-06-17 ENCOUNTER — Telehealth: Payer: Self-pay

## 2013-06-17 NOTE — Telephone Encounter (Signed)
Phone call from patient's wife Joelene Millin stating a quantity limit exception is needed for his Zolpidem 10 mg. This has been submitted through [facility] on cover my meds.

## 2013-06-20 NOTE — Telephone Encounter (Signed)
Received a fax back from [facility] stating they are unable to approve the PA request.

## 2013-06-30 ENCOUNTER — Telehealth: Payer: Self-pay | Admitting: Neurology

## 2013-06-30 MED ORDER — GABAPENTIN 100 MG PO CAPS: 300.0000 mg | ORAL_CAPSULE | Freq: Every day | ORAL | Status: DC

## 2013-06-30 NOTE — Telephone Encounter (Signed)
Pt called to schedule with Dr. Krista Blue and get prescription gabapentin (NEURONTIN) 100 MG capsule refilled. I schedule pt for 09/08/13 and pt would like for refill to be called into [facility] on [address] when pt comes in for his apt pt will discuss with Dr. Krista Blue about using [facility]. Please call pt back when refill has been called in. Thanks

## 2013-06-30 NOTE — Telephone Encounter (Signed)
Rx has been sent.  I spoke with the patient.  He is aware.   

## 2013-07-14 ENCOUNTER — Telehealth: Payer: Self-pay | Admitting: Internal Medicine

## 2013-07-14 NOTE — Telephone Encounter (Signed)
Patient needs a 30-day supply of his zolpidem (AMBIEN) 10 MG tablet sent to [facility] on [address]. He is out and [facility] will not be able to send any out for another 2 weeks. Please advise.

## 2013-07-14 NOTE — Telephone Encounter (Signed)
Patient has never been seen by MD, need to see someone

## 2013-07-15 ENCOUNTER — Telehealth: Payer: Self-pay | Admitting: Internal Medicine

## 2013-07-15 NOTE — Telephone Encounter (Signed)
This pt is a transfer from Dr. Linda Hedges.  I have made him an appt for April 22, but he is out of [place].  There was a problem in March with the mail order.  Could he have some called into the [facility] on [address]. He is out of the medicine.

## 2013-07-16 MED ORDER — ZOLPIDEM TARTRATE 10 MG PO TABS: 10.0000 mg | ORAL_TABLET | Freq: Every evening | ORAL | Status: DC | PRN

## 2013-07-16 NOTE — Telephone Encounter (Signed)
done

## 2013-07-17 NOTE — Telephone Encounter (Signed)
LMOM to return call.

## 2013-07-30 ENCOUNTER — Ambulatory Visit (INDEPENDENT_AMBULATORY_CARE_PROVIDER_SITE_OTHER): Payer: 59 | Admitting: Internal Medicine

## 2013-07-30 ENCOUNTER — Other Ambulatory Visit (INDEPENDENT_AMBULATORY_CARE_PROVIDER_SITE_OTHER): Payer: 59

## 2013-07-30 ENCOUNTER — Encounter: Payer: Self-pay | Admitting: Internal Medicine

## 2013-07-30 VITALS — BP 142/94 | HR 57 | Temp 97.4°F | Resp 16 | Wt 180.0 lb

## 2013-07-30 DIAGNOSIS — K219 Gastro-esophageal reflux disease without esophagitis: Secondary | ICD-10-CM

## 2013-07-30 DIAGNOSIS — I10 Essential (primary) hypertension: Secondary | ICD-10-CM | POA: Insufficient documentation

## 2013-07-30 DIAGNOSIS — Z23 Encounter for immunization: Secondary | ICD-10-CM | POA: Insufficient documentation

## 2013-07-30 DIAGNOSIS — R319 Hematuria, unspecified: Secondary | ICD-10-CM | POA: Insufficient documentation

## 2013-07-30 DIAGNOSIS — Z0000 Encounter for general adult medical examination without abnormal findings: Secondary | ICD-10-CM | POA: Insufficient documentation

## 2013-07-30 DIAGNOSIS — R7309 Other abnormal glucose: Secondary | ICD-10-CM

## 2013-07-30 DIAGNOSIS — G4700 Insomnia, unspecified: Secondary | ICD-10-CM

## 2013-07-30 LAB — CBC WITH DIFFERENTIAL/PLATELET
BASOS PCT: 0.6 % (ref 0.0–3.0)
Basophils Absolute: 0 10*3/uL (ref 0.0–0.1)
EOS ABS: 0.1 10*3/uL (ref 0.0–0.7)
EOS PCT: 1.1 % (ref 0.0–5.0)
HCT: 46.6 % (ref 39.0–52.0)
HEMOGLOBIN: 16 g/dL (ref 13.0–17.0)
LYMPHS PCT: 18 % (ref 12.0–46.0)
Lymphs Abs: 1 10*3/uL (ref 0.7–4.0)
MCHC: 34.4 g/dL (ref 30.0–36.0)
MCV: 93.5 fl (ref 78.0–100.0)
MONOS PCT: 9.6 % (ref 3.0–12.0)
Monocytes Absolute: 0.5 10*3/uL (ref 0.1–1.0)
NEUTROS ABS: 3.7 10*3/uL (ref 1.4–7.7)
NEUTROS PCT: 70.7 % (ref 43.0–77.0)
Platelets: 305 10*3/uL (ref 150.0–400.0)
RBC: 4.98 Mil/uL (ref 4.22–5.81)
RDW: 14.1 % (ref 11.5–14.6)
WBC: 5.3 10*3/uL (ref 4.5–10.5)

## 2013-07-30 LAB — LIPID PANEL
CHOLESTEROL: 210 mg/dL — AB (ref 0–200)
HDL: 65.8 mg/dL (ref >39.00)
LDL CALC: 131 mg/dL — AB (ref 0–99)
TRIGLYCERIDES: 67 mg/dL (ref 0.0–149.0)
Total CHOL/HDL Ratio: 3
VLDL: 13.4 mg/dL (ref 0.0–40.0)

## 2013-07-30 LAB — URINALYSIS, ROUTINE W REFLEX MICROSCOPIC
BILIRUBIN URINE: NEGATIVE
Ketones, ur: NEGATIVE
LEUKOCYTES UA: NEGATIVE
NITRITE: NEGATIVE
PH: 5.5 (ref 5.0–8.0)
Specific Gravity, Urine: >=1.03 — AB (ref 1.000–1.030)
TOTAL PROTEIN, URINE-UPE24: NEGATIVE
Urine Glucose: NEGATIVE
Urobilinogen, UA: 0.2 (ref 0.0–1.0)
WBC UA: NONE SEEN (ref 0–?)

## 2013-07-30 LAB — HEMOGLOBIN A1C: Hgb A1c MFr Bld: 5.5 % (ref 4.6–6.5)

## 2013-07-30 LAB — COMPREHENSIVE METABOLIC PANEL
ALBUMIN: 4.1 g/dL (ref 3.5–5.2)
ALT: 21 U/L (ref 0–53)
AST: 27 U/L (ref 0–37)
Alkaline Phosphatase: 57 U/L (ref 39–117)
BUN: 25 mg/dL — AB (ref 6–23)
CALCIUM: 9.7 mg/dL (ref 8.4–10.5)
CHLORIDE: 101 meq/L (ref 96–112)
CO2: 28 meq/L (ref 19–32)
CREATININE: 0.9 mg/dL (ref 0.4–1.5)
GFR: 95.99 mL/min (ref >60.00)
GLUCOSE: 101 mg/dL — AB (ref 70–99)
POTASSIUM: 4.2 meq/L (ref 3.5–5.1)
Sodium: 138 mEq/L (ref 135–145)
Total Bilirubin: 0.8 mg/dL (ref 0.3–1.2)
Total Protein: 7 g/dL (ref 6.0–8.3)

## 2013-07-30 LAB — PSA: PSA: 0.8 ng/mL (ref 0.10–4.00)

## 2013-07-30 LAB — HEPATITIS C ANTIBODY: HCV AB: NEGATIVE

## 2013-07-30 LAB — TSH: TSH: 6.59 u[IU]/mL — AB (ref 0.35–5.50)

## 2013-07-30 LAB — FECAL OCCULT BLOOD, GUAIAC: FECAL OCCULT BLD: NEGATIVE

## 2013-07-30 MED ORDER — ZOLPIDEM TARTRATE 10 MG PO TABS: 10.0000 mg | ORAL_TABLET | Freq: Every evening | ORAL | Status: DC | PRN

## 2013-07-30 MED ORDER — OLMESARTAN MEDOXOMIL 40 MG PO TABS: 40.0000 mg | ORAL_TABLET | Freq: Every day | ORAL | Status: DC

## 2013-07-30 NOTE — Progress Notes (Signed)
Subjective:    Patient ID: Brandon [name], male    DOB: 12/04/1950, 63 y.o.   MRN: [id number]  Hypertension This is a new problem. The problem has been gradually worsening since onset. The problem is uncontrolled. Pertinent negatives include no anxiety, blurred vision, chest pain, headaches, malaise/fatigue, neck pain, orthopnea, palpitations, peripheral edema, PND, shortness of breath or sweats. Agents associated with hypertension include NSAIDs. Past treatments include nothing. The current treatment provides no improvement. Compliance problems include diet and exercise.       Review of Systems  Constitutional: Negative.  Negative for fever, chills, malaise/fatigue, diaphoresis, appetite change and fatigue.  HENT: Negative.   Eyes: Negative.  Negative for blurred vision.  Respiratory: Negative.  Negative for apnea, cough, choking, chest tightness, shortness of breath, wheezing and stridor.   Cardiovascular: Negative.  Negative for chest pain, palpitations, orthopnea, leg swelling and PND.  Gastrointestinal: Negative.  Negative for nausea, vomiting, abdominal pain, diarrhea, constipation and blood in stool.  Endocrine: Negative.   Genitourinary: Negative.  Negative for dysuria, urgency, frequency, hematuria, decreased urine volume, scrotal swelling, enuresis, difficulty urinating and testicular pain.  Musculoskeletal: Negative.  Negative for arthralgias, back pain, gait problem, myalgias and neck pain.  Skin: Negative.   Allergic/Immunologic: Negative.   Neurological: Negative.  Negative for headaches.  Hematological: Negative.  Negative for adenopathy. Does not bruise/bleed easily.  Psychiatric/Behavioral: Positive for sleep disturbance. Negative for suicidal ideas, hallucinations, behavioral problems, confusion, self-injury, dysphoric mood, decreased concentration and agitation. The patient is not nervous/anxious and is not hyperactive.        Objective:   Physical Exam  Vitals  reviewed. Constitutional: He is oriented to person, place, and time. He appears well-developed and well-nourished. No distress.  HENT:  Head: Normocephalic and atraumatic.  Mouth/Throat: Oropharynx is clear and moist. No oropharyngeal exudate.  Eyes: Conjunctivae are normal. Right eye exhibits no discharge. Left eye exhibits no discharge. No scleral icterus.  Neck: Normal range of motion. Neck supple. No JVD present. No tracheal deviation present. No thyromegaly present.  Cardiovascular: Normal rate, regular rhythm, normal heart sounds and intact distal pulses.  Exam reveals no gallop and no friction rub.   No murmur heard. Pulmonary/Chest: Effort normal and breath sounds normal. No stridor. No respiratory distress. He has no wheezes. He has no rales. He exhibits no tenderness.  Abdominal: Soft. Bowel sounds are normal. He exhibits no distension and no mass. There is no tenderness. There is no rebound and no guarding. Hernia confirmed negative in the right inguinal area and confirmed negative in the left inguinal area.  Genitourinary: Rectum normal, prostate normal, testes normal and penis normal. Rectal exam shows no external hemorrhoid, no internal hemorrhoid, no fissure, no mass, no tenderness and anal tone normal. Guaiac negative stool. Prostate is not enlarged and not tender. Right testis shows no mass, no swelling and no tenderness. Right testis is descended. Left testis shows no mass, no swelling and no tenderness. Left testis is descended. Circumcised. No penile erythema or penile tenderness. No discharge found.  Musculoskeletal: Normal range of motion. He exhibits no edema and no tenderness.  Lymphadenopathy:    He has no cervical adenopathy.       Right: No inguinal adenopathy present.       Left: No inguinal adenopathy present.  Neurological: He is oriented to person, place, and time.  Skin: Skin is warm and dry. No rash noted. He is not diaphoretic. No erythema. No pallor.    Psychiatric: He has a  normal mood and affect. His behavior is normal. Judgment and thought content normal.     Lab Results  Component Value Date   WBC 5.9 11/13/2012   HGB 15.5 11/13/2012   HCT 44.4 11/13/2012   PLT 276 11/13/2012   GLUCOSE 107 11/13/2012   CHOL 195 04/07/2010   TRIG 102.0 04/07/2010   HDL 49.10 04/07/2010   LDLDIRECT 147.7 12/03/2007   LDLCALC 126* 04/07/2010   ALT 16 11/13/2012   AST 21 11/13/2012   NA 143 11/13/2012   K 3.5 11/13/2012   CL 102 11/13/2011   CREATININE 0.9 11/13/2012   BUN 27.7* 11/13/2012   CO2 24 11/13/2012   TSH 2.835 11/13/2012   PSA 0.61 04/07/2010   INR 1.0 09/01/2008       Assessment & Plan:

## 2013-07-30 NOTE — Assessment & Plan Note (Signed)
He will cont [place] as needed

## 2013-07-30 NOTE — Assessment & Plan Note (Signed)
I will check his labs today to screen for secondary causes of HTN and to look for end organ damage I have asked him to start an ARB to control the BP

## 2013-07-30 NOTE — Assessment & Plan Note (Signed)
I will check his A1C to see if he has developed DM2 

## 2013-07-30 NOTE — Assessment & Plan Note (Signed)
I will monitor this for now

## 2013-07-30 NOTE — Patient Instructions (Signed)
Health Maintenance, Males A healthy lifestyle and preventative care can promote health and wellness.  Maintain regular health, dental, and eye exams.  Eat a healthy diet. Foods like vegetables, fruits, whole grains, low-fat dairy products, and lean protein foods contain the nutrients you need and are low in calories. Decrease your intake of foods high in solid fats, added sugars, and salt. Get information about a proper diet from your health care provider, if necessary.  Regular physical exercise is one of the most important things you can do for your health. Most adults should get at least 150 minutes of moderate-intensity exercise (any activity that increases your heart rate and causes you to sweat) each week. In addition, most adults need muscle-strengthening exercises on 2 or more days a week.   Maintain a healthy weight. The body mass index (BMI) is a screening tool to identify possible weight problems. It provides an estimate of body fat based on height and weight. Your health care provider can find your BMI and can help you achieve or maintain a healthy weight. For males 20 years and older:  A BMI below 18.5 is considered underweight.  A BMI of 18.5 to 24.9 is normal.  A BMI of 25 to 29.9 is considered overweight.  A BMI of 30 and above is considered obese.  Maintain normal blood lipids and cholesterol by exercising and minimizing your intake of saturated fat. Eat a balanced diet with plenty of fruits and vegetables. Blood tests for lipids and cholesterol should begin at age 20 and be repeated every 5 years. If your lipid or cholesterol levels are high, you are over 50, or you are at high risk for heart disease, you may need your cholesterol levels checked more frequently.Ongoing high lipid and cholesterol levels should be treated with medicines, if diet and exercise are not working.  If you smoke, find out from your health care provider how to quit. If you do not use tobacco, do not  start.  Lung cancer screening is recommended for adults aged 55 80 years who are at high risk for developing lung cancer because of a history of smoking. A yearly low-dose CT scan of the lungs is recommended for people who have at least a 30-pack-year history of smoking and are a current smoker or have quit within the past 15 years. A pack year of smoking is smoking an average of 1 pack of cigarettes a day for 1 year (for example, a 30-pack-year history of smoking could mean smoking 1 pack a day for 30 years or 2 packs a day for 15 years). Yearly screening should continue until the smoker has stopped smoking for at least 15 years. Yearly screening should be stopped for people who develop a health problem that would prevent them from having lung cancer treatment.  If you choose to drink alcohol, do not have more than 2 drinks per day. One drink is considered to be 12 oz (360 mL) of beer, 5 oz (150 mL) of wine, or 1.5 oz (45 mL) of liquor.  Avoid use of street drugs. Do not share needles with anyone. Ask for help if you need support or instructions about stopping the use of drugs.  High blood pressure causes heart disease and increases the risk of stroke. Blood pressure should be checked at least every 1 2 years. Ongoing high blood pressure should be treated with medicines if weight loss and exercise are not effective.  If you are 45 62 years old, ask your health   care provider if you should take aspirin to prevent heart disease.  Diabetes screening involves taking a blood sample to check your fasting blood sugar level. This should be done once every 3 years after age 45, if you are at a normal weight and without risk factors for diabetes. Testing should be considered at a younger age or be carried out more frequently if you are overweight and have at least 1 risk factor for diabetes.  Colorectal cancer can be detected and often prevented. Most routine colorectal cancer screening begins at the age of 50  and continues through age 75. However, your health care provider may recommend screening at an earlier age if you have risk factors for colon cancer. On a yearly basis, your health care provider may provide home test kits to check for hidden blood in the stool. A small camera at the end of a tube may be used to directly examine the colon (sigmoidoscopy or colonoscopy) to detect the earliest forms of colorectal cancer. Talk to your health care provider about this at age 50, when routine screening begins. A direct exam of the colon should be repeated every 5 10 years through age 75, unless early forms of pre-cancerous polyps or small growths are found.  People who are at an increased risk for hepatitis B should be screened for this virus. You are considered at high risk for hepatitis B if:  You were born in a country where hepatitis B occurs often. Talk with your health care provider about which countries are considered high-risk.  Your parents were born in a high-risk country and you have not received a shot to protect against hepatitis B (hepatitis B vaccine).  You have HIV or AIDS.  You use needles to inject street drugs.  You live with, or have sex with, someone who has hepatitis B.  You are a man who has sex with other men (MSM).  You get hemodialysis treatment.  You take certain medicines for conditions like cancer, organ transplantation, and autoimmune conditions.  Hepatitis C blood testing is recommended for all people born from 1945 through 1965 and any individual with known risk factors for hepatitis C.  Healthy men should no longer receive prostate-specific antigen (PSA) blood tests as part of routine cancer screening. Talk to your health care provider about prostate cancer screening.  Testicular cancer screening is not recommended for adolescents or adult males who have no symptoms. Screening includes self-exam, a health care provider exam, and other screening tests. Consult with  your health care provider about any symptoms you have or any concerns you have about testicular cancer.  Practice safe sex. Use condoms and avoid high-risk sexual practices to reduce the spread of sexually transmitted infections (STIs).  Use sunscreen. Apply sunscreen liberally and repeatedly throughout the day. You should seek shade when your shadow is shorter than you. Protect yourself by wearing long sleeves, pants, a wide-brimmed hat, and sunglasses year round, whenever you are outdoors.  Tell your health care provider of new moles or changes in moles, especially if there is a change in shape or color. Also tell your provider if a mole is larger than the size of a pencil eraser.  A one-time screening for abdominal aortic aneurysm (AAA) and surgical repair of large AAAs by ultrasound is recommended for men aged 65 75 years who are current or former smokers.  Stay current with your vaccines (immunizations). Document Released: 09/23/2007 Document Revised: 01/15/2013 Document Reviewed: 08/22/2010 ExitCare Patient Information 2014 [employer].   Hypertension As your heart beats, it forces blood through your arteries. This force is your blood pressure. If the pressure is too high, it is called hypertension (HTN) or high blood pressure. HTN is dangerous because you may have it and not know it. High blood pressure may mean that your heart has to work harder to pump blood. Your arteries may be narrow or stiff. The extra work puts you at risk for heart disease, stroke, and other problems.  Blood pressure consists of two numbers, a higher number over a lower, 110/72, for example. It is stated as "110 over 72." The ideal is below 120 for the top number (systolic) and under 80 for the bottom (diastolic). Write down your blood pressure today. You should pay close attention to your blood pressure if you have certain conditions such as:  Heart failure.  Prior heart attack.  Diabetes  Chronic kidney  disease.  Prior stroke.  Multiple risk factors for heart disease. To see if you have HTN, your blood pressure should be measured while you are seated with your arm held at the level of the heart. It should be measured at least twice. A one-time elevated blood pressure reading (especially in the Emergency Department) does not mean that you need treatment. There may be conditions in which the blood pressure is different between your right and left arms. It is important to see your caregiver soon for a recheck. Most people have essential hypertension which means that there is not a specific cause. This type of high blood pressure may be lowered by changing lifestyle factors such as:  Stress.  Smoking.  Lack of exercise.  Excessive weight.  Drug/tobacco/alcohol use.  Eating less salt. Most people do not have symptoms from high blood pressure until it has caused damage to the body. Effective treatment can often prevent, delay or reduce that damage. TREATMENT  When a cause has been identified, treatment for high blood pressure is directed at the cause. There are a large number of medications to treat HTN. These fall into several categories, and your caregiver will help you select the medicines that are best for you. Medications may have side effects. You should review side effects with your caregiver. If your blood pressure stays high after you have made lifestyle changes or started on medicines,   Your medication(s) may need to be changed.  Other problems may need to be addressed.  Be certain you understand your prescriptions, and know how and when to take your medicine.  Be sure to follow up with your caregiver within the time frame advised (usually within two weeks) to have your blood pressure rechecked and to review your medications.  If you are taking more than one medicine to lower your blood pressure, make sure you know how and at what times they should be taken. Taking two medicines  at the same time can result in blood pressure that is too low. SEEK IMMEDIATE MEDICAL CARE IF:  You develop a severe headache, blurred or changing vision, or confusion.  You have unusual weakness or numbness, or a faint feeling.  You have severe chest or abdominal pain, vomiting, or breathing problems. MAKE SURE YOU:   Understand these instructions.  Will watch your condition.  Will get help right away if you are not doing well or get worse. Document Released: 03/27/2005 Document Revised: 06/19/2011 Document Reviewed: 11/15/2007 ExitCare Patient Information 2014 [employer].  

## 2013-07-30 NOTE — Assessment & Plan Note (Signed)
Exam done Vaccines were reviewed and updated He was referred for a colonoscopy Labs ordered Pt ed material was given

## 2013-07-30 NOTE — Progress Notes (Signed)
Pre visit review using our clinic review tool, if applicable. No additional management support is needed unless otherwise documented below in the visit note. 

## 2013-07-31 ENCOUNTER — Other Ambulatory Visit: Payer: Self-pay

## 2013-07-31 NOTE — Telephone Encounter (Signed)
Received pharmacy rejection stating that insurance will not cover Benicar  without a prior authorization. PA sent to plan via covermymeds, approval now pending their decision

## 2013-08-13 ENCOUNTER — Telehealth: Payer: Self-pay | Admitting: Hematology & Oncology

## 2013-08-13 NOTE — Telephone Encounter (Signed)
Pt moved care to this location, per Dr. Marin Olp ok to keep 8-5 day. Pt aware of new location for appointment

## 2013-08-14 ENCOUNTER — Telehealth: Payer: Self-pay | Admitting: Internal Medicine

## 2013-08-14 NOTE — Telephone Encounter (Signed)
PA for Benicar has been approved. Fax received on 08/14/13. Pharm called and LVMM with pt.

## 2013-08-29 ENCOUNTER — Encounter: Payer: Self-pay | Admitting: Internal Medicine

## 2013-09-03 ENCOUNTER — Other Ambulatory Visit: Payer: Self-pay | Admitting: Internal Medicine

## 2013-09-03 DIAGNOSIS — R319 Hematuria, unspecified: Secondary | ICD-10-CM

## 2013-09-05 ENCOUNTER — Encounter: Payer: Self-pay | Admitting: Internal Medicine

## 2013-09-08 ENCOUNTER — Ambulatory Visit (INDEPENDENT_AMBULATORY_CARE_PROVIDER_SITE_OTHER): Payer: 59 | Admitting: Neurology

## 2013-09-08 ENCOUNTER — Encounter: Payer: Self-pay | Admitting: Neurology

## 2013-09-08 VITALS — BP 125/79 | HR 63 | Ht 71.0 in | Wt 176.0 lb

## 2013-09-08 DIAGNOSIS — T451X5A Adverse effect of antineoplastic and immunosuppressive drugs, initial encounter: Secondary | ICD-10-CM | POA: Insufficient documentation

## 2013-09-08 DIAGNOSIS — R202 Paresthesia of skin: Secondary | ICD-10-CM

## 2013-09-08 DIAGNOSIS — G620 Drug-induced polyneuropathy: Secondary | ICD-10-CM | POA: Insufficient documentation

## 2013-09-08 DIAGNOSIS — R209 Unspecified disturbances of skin sensation: Secondary | ICD-10-CM

## 2013-09-08 MED ORDER — GABAPENTIN 100 MG PO CAPS
300.0000 mg | ORAL_CAPSULE | Freq: Every day | ORAL | Status: DC
Start: 2013-09-08 — End: 2017-03-23

## 2013-09-08 NOTE — Progress Notes (Signed)
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, in 2011, he has received local radiation therapy, followed by chemotherapy in 2011,  Since the treatment, he complains of dry mouth, Intermittent sharp shooting pain from the left upper painful, much milder now,  Gabapentin 100 mg 2-3 tablets every night has been very helpful, he is not taking any medication during the daytime,  He also complains of left TMJ joints pain, difficulty opening his mouth occasionally   REVIEW OF SYSTEMS: Full 14 system review of systems performed and notable only for trouble swallowing, insomnia, numbness tingling  ALLERGIES: No Known Allergies  HOME MEDICATIONS: Current Outpatient Prescriptions on File Prior to Visit  Medication Sig Dispense Refill  . ibuprofen (ADVIL,MOTRIN) 200 MG tablet Take 200 mg by mouth every 6 (six) hours as needed.      Marland Kitchen olmesartan (BENICAR) 40 MG tablet Take 1 tablet (40 mg total) by mouth daily.  90 tablet  1  . omeprazole (PRILOSEC) 40 MG capsule TAKE 1 CAPSULE DAILY  90 capsule  2  . zolpidem (AMBIEN) 10 MG tablet Take 1 tablet (10 mg total) by mouth at bedtime as needed.  30 tablet  5   No current facility-administered medications on file prior to visit.    PAST MEDICAL HISTORY: Past Medical History  Diagnosis Date  . Head and neck cancer     head and neck ca dx 03/2009  . oropharyngeal ca dx'd 03/2009    chemo/xrt comp 06/2009    PAST SURGICAL HISTORY: Past Surgical History  Procedure Laterality Date  . Hip replacemnt left    . Stmach surgery      repair peg tube site  . Left eye      FAMILY HISTORY: Family History  Problem Relation Age of Onset  . Cancer Mother     lymphoma  . Cancer Father     lung (smoker)  . Hyperlipidemia Father   . Hypertension  Father   . Diabetes Father     SOCIAL HISTORY:  History   Social History  . Marital Status: Married    Spouse Name: N/A    Number of Children: 2  . 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 Smoking status: Former Smoker    Types: Cigarettes, Cigars    Quit date: 04/19/2009  . Smokeless tobacco: Never Used  . Alcohol Use: 9.0 oz/week    15 Shots of liquor per week  . Drug Use: No  . Sexual Activity: Yes    Partners: Female   Other Topics Concern  . Not on file   Social History Narrative   HSG, [employer]-no degree. married - '76 - 5 years, divorced; remarried  '84. 1 son - '85; 1 daughter '89. 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      PHYSICAL EXAM   Filed Vitals:   09/08/13 0805  BP: 125/79  Pulse: 63  Height: $Remove'5\' 11"'gFWnrof$  (1.803 m)  Weight: 176 lb (79.833 kg)    Not recorded    Body mass index is 24.56 kg/(m^2).   Generalized: In no acute distress  Neck: Supple, no carotid bruits   Cardiac: Regular rate rhythm  Pulmonary: Clear to auscultation bilaterally  Musculoskeletal: No deformity  Neurological examination  Mentation: Alert oriented to time, place, history taking, and causual conversation  Cranial nerve II-XII: Pupils were equal round reactive to light. Extraocular movements were full.  Visual field were full on confrontational test. Bilateral fundi were sharp.  Facial sensation and strength were normal. Hearing was intact to finger rubbing bilaterally. Uvula tongue midline.  Head turning and shoulder shrug and were normal and symmetric.Tongue protrusion into cheek strength was normal.  Motor: Normal tone, bulk and strength.  Sensory: Intact to fine touch, pinprick, preserved vibratory sensation, and proprioception at toes.  Coordination: Normal finger to nose, heel-to-shin bilaterally there was no truncal ataxia  Gait: Rising up from seated position without assistance, normal  stance, without trunk ataxia, moderate stride, good arm swing, smooth turning, able to perform tiptoe, and heel walking without difficulty.   Romberg signs: Negative  Deep tendon reflexes: Brachioradialis 2/2, biceps 2/2, triceps 2/2, patellar 2/2, Achilles 2/2, plantar responses were flexor bilaterally.   DIAGNOSTIC DATA (LABS, IMAGING, TESTING) - I reviewed patient records, labs, notes, testing and imaging myself where available.  Lab Results  Component Value Date   WBC 5.3 07/30/2013   HGB 16.0 07/30/2013   HCT 46.6 07/30/2013   MCV 93.5 07/30/2013   PLT 305.0 07/30/2013      Component Value Date/Time   NA 138 07/30/2013 1013   NA 143 11/13/2012 0857   NA 143 11/13/2011 0906   K 4.2 07/30/2013 1013   K 3.5 11/13/2012 0857   K 3.6 11/13/2011 0906   CL 101 07/30/2013 1013   CL 102 11/13/2011 0906   CO2 28 07/30/2013 1013   CO2 24 11/13/2012 0857   CO2 29 11/13/2011 0906   GLUCOSE 101* 07/30/2013 1013   GLUCOSE 107 11/13/2012 0857   GLUCOSE 101 11/13/2011 0906   BUN 25* 07/30/2013 1013   BUN 27.7* 11/13/2012 0857   BUN 27* 11/13/2011 0906   CREATININE 0.9 07/30/2013 1013   CREATININE 0.9 11/13/2012 0857   CREATININE 1.0 11/13/2011 0906   CALCIUM 9.7 07/30/2013 1013   CALCIUM 9.8 11/13/2012 0857   CALCIUM 9.3 11/13/2011 0906   PROT 7.0 07/30/2013 1013   PROT 7.4 11/13/2012 0857   PROT 6.7 11/13/2011 0906   ALBUMIN 4.1 07/30/2013 1013   ALBUMIN 4.0 11/13/2012 0857   AST 27 07/30/2013 1013   AST 21 11/13/2012 0857   AST 27 11/13/2011 0906   ALT 21 07/30/2013 1013   ALT 16 11/13/2012 0857   ALT 23 11/13/2011 0906   ALKPHOS 57 07/30/2013 1013   ALKPHOS 63 11/13/2012 0857   ALKPHOS 60 11/13/2011 0906   BILITOT 0.8 07/30/2013 1013   BILITOT 0.59 11/13/2012 0857   BILITOT 0.90 11/13/2011 0906   GFRNONAA >60 05/04/2010 0437   GFRAA  Value: >60        The eGFR has been calculated using the MDRD equation. This calculation has not been validated in all clinical situations. eGFR's persistently <60 mL/min signify possible Chronic Kidney  Disease. 05/04/2010 0437   Lab Results  Component Value Date   CHOL 210* 07/30/2013   HDL 65.80 07/30/2013   LDLCALC 131* 07/30/2013   LDLDIRECT 147.7 12/03/2007   TRIG 67.0 07/30/2013   CHOLHDL 3 07/30/2013   Lab Results  Component Value Date   HGBA1C 5.5 07/30/2013   No results found for this basename: [id number]   Lab Results  Component Value Date   TSH 6.59* 07/30/2013   ASSESSMENT AND PLAN  Brandon [name]  is a 63 y.o. male complains of left facial paresthesia, doing well on gabapentin, refill his gabapentin 100 mg 3 tablets every night, he is to continue refill through his primary care physician, only return to clinic for new issues      Marcial Pacas, M.D. Ph.D.  [facility] [address] [place], [place] [address] [phone]

## 2013-09-10 ENCOUNTER — Encounter: Payer: Self-pay | Admitting: Internal Medicine

## 2013-09-10 ENCOUNTER — Other Ambulatory Visit (INDEPENDENT_AMBULATORY_CARE_PROVIDER_SITE_OTHER): Payer: 59

## 2013-09-10 DIAGNOSIS — R319 Hematuria, unspecified: Secondary | ICD-10-CM

## 2013-09-10 LAB — URINALYSIS, ROUTINE W REFLEX MICROSCOPIC
Hgb urine dipstick: NEGATIVE
Leukocytes, UA: NEGATIVE
Nitrite: NEGATIVE
RBC / HPF: NONE SEEN (ref 0–?)
URINE GLUCOSE: NEGATIVE
Urobilinogen, UA: 0.2 (ref 0.0–1.0)
pH: 5.5 (ref 5.0–8.0)

## 2013-10-06 ENCOUNTER — Other Ambulatory Visit: Payer: Self-pay

## 2013-10-07 ENCOUNTER — Other Ambulatory Visit: Payer: Self-pay

## 2013-10-07 MED ORDER — OMEPRAZOLE 40 MG PO CPDR: DELAYED_RELEASE_CAPSULE | ORAL | Status: DC

## 2013-10-07 NOTE — Telephone Encounter (Signed)
Faxed refill request   

## 2013-10-09 ENCOUNTER — Encounter: Payer: Self-pay | Admitting: Internal Medicine

## 2013-10-09 ENCOUNTER — Other Ambulatory Visit: Payer: Self-pay | Admitting: Internal Medicine

## 2013-10-09 DIAGNOSIS — G4700 Insomnia, unspecified: Secondary | ICD-10-CM

## 2013-10-09 MED ORDER — ZOLPIDEM TARTRATE 10 MG PO TABS: 10.0000 mg | ORAL_TABLET | Freq: Every evening | ORAL | Status: DC | PRN

## 2013-11-03 ENCOUNTER — Other Ambulatory Visit: Payer: Self-pay | Admitting: Internal Medicine

## 2013-11-03 ENCOUNTER — Encounter: Payer: Self-pay | Admitting: Internal Medicine

## 2013-11-03 DIAGNOSIS — G4700 Insomnia, unspecified: Secondary | ICD-10-CM

## 2013-11-03 MED ORDER — ZOLPIDEM TARTRATE 10 MG PO TABS: 10.0000 mg | ORAL_TABLET | Freq: Every evening | ORAL | Status: DC | PRN

## 2013-11-11 ENCOUNTER — Telehealth: Payer: Self-pay | Admitting: Hematology & Oncology

## 2013-11-11 NOTE — Telephone Encounter (Signed)
Spoke w NEW PATIENT wife Joelene Millin) today to remind them of their appointment with Dr. Marin Olp. Also, advised them to bring all medication bottles and insurance card information.

## 2013-11-12 ENCOUNTER — Encounter: Payer: Self-pay | Admitting: Internal Medicine

## 2013-11-12 ENCOUNTER — Other Ambulatory Visit: Payer: 59

## 2013-11-12 ENCOUNTER — Other Ambulatory Visit (HOSPITAL_BASED_OUTPATIENT_CLINIC_OR_DEPARTMENT_OTHER): Payer: 59 | Admitting: Lab

## 2013-11-12 ENCOUNTER — Ambulatory Visit (HOSPITAL_BASED_OUTPATIENT_CLINIC_OR_DEPARTMENT_OTHER): Payer: 59 | Admitting: Hematology & Oncology

## 2013-11-12 ENCOUNTER — Ambulatory Visit: Payer: 59 | Admitting: Hematology & Oncology

## 2013-11-12 ENCOUNTER — Ambulatory Visit: Payer: 59

## 2013-11-12 ENCOUNTER — Encounter: Payer: Self-pay | Admitting: Hematology & Oncology

## 2013-11-12 ENCOUNTER — Ambulatory Visit: Payer: 59 | Admitting: Hematology and Oncology

## 2013-11-12 VITALS — BP 169/100 | HR 57 | Temp 97.8°F | Resp 18 | Ht 70.0 in | Wt 179.0 lb

## 2013-11-12 DIAGNOSIS — C801 Malignant (primary) neoplasm, unspecified: Secondary | ICD-10-CM

## 2013-11-12 DIAGNOSIS — R590 Localized enlarged lymph nodes: Secondary | ICD-10-CM

## 2013-11-12 DIAGNOSIS — E559 Vitamin D deficiency, unspecified: Secondary | ICD-10-CM

## 2013-11-12 DIAGNOSIS — Z0000 Encounter for general adult medical examination without abnormal findings: Secondary | ICD-10-CM

## 2013-11-12 DIAGNOSIS — C099 Malignant neoplasm of tonsil, unspecified: Secondary | ICD-10-CM

## 2013-11-12 DIAGNOSIS — E039 Hypothyroidism, unspecified: Secondary | ICD-10-CM

## 2013-11-12 DIAGNOSIS — Z85819 Personal history of malignant neoplasm of unspecified site of lip, oral cavity, and pharynx: Secondary | ICD-10-CM

## 2013-11-12 LAB — COMPREHENSIVE METABOLIC PANEL
ALK PHOS: 61 U/L (ref 39–117)
ALT: 14 U/L (ref 0–53)
AST: 20 U/L (ref 0–37)
Albumin: 4.4 g/dL (ref 3.5–5.2)
BILIRUBIN TOTAL: 0.4 mg/dL (ref 0.2–1.2)
BUN: 26 mg/dL — AB (ref 6–23)
CO2: 28 mEq/L (ref 19–32)
Calcium: 9.6 mg/dL (ref 8.4–10.5)
Chloride: 103 mEq/L (ref 96–112)
Creatinine, Ser: 0.93 mg/dL (ref 0.50–1.35)
GLUCOSE: 102 mg/dL — AB (ref 70–99)
POTASSIUM: 4.3 meq/L (ref 3.5–5.3)
Sodium: 142 mEq/L (ref 135–145)
Total Protein: 7.2 g/dL (ref 6.0–8.3)

## 2013-11-12 LAB — CBC WITH DIFFERENTIAL (CANCER CENTER ONLY)
BASO#: 0 10*3/uL (ref 0.0–0.2)
BASO%: 0.6 % (ref 0.0–2.0)
EOS ABS: 0.1 10*3/uL (ref 0.0–0.5)
EOS%: 2.3 % (ref 0.0–7.0)
HCT: 44.6 % (ref 38.7–49.9)
HGB: 15.9 g/dL (ref 13.0–17.1)
LYMPH#: 1.3 10*3/uL (ref 0.9–3.3)
LYMPH%: 25.6 % (ref 14.0–48.0)
MCH: 33 pg (ref 28.0–33.4)
MCHC: 35.7 g/dL (ref 32.0–35.9)
MCV: 93 fL (ref 82–98)
MONO#: 0.4 10*3/uL (ref 0.1–0.9)
MONO%: 8.4 % (ref 0.0–13.0)
NEUT%: 63.1 % (ref 40.0–80.0)
NEUTROS ABS: 3.2 10*3/uL (ref 1.5–6.5)
Platelets: 285 10*3/uL (ref 145–400)
RBC: 4.82 10*6/uL (ref 4.20–5.70)
RDW: 14.8 % (ref 11.1–15.7)
WBC: 5.1 10*3/uL (ref 4.0–10.0)

## 2013-11-12 LAB — TSH CHCC: TSH: 8.241 m(IU)/L — ABNORMAL HIGH (ref 0.320–4.118)

## 2013-11-13 ENCOUNTER — Other Ambulatory Visit: Payer: Self-pay | Admitting: Nurse Practitioner

## 2013-11-13 ENCOUNTER — Encounter: Payer: Self-pay | Admitting: Nurse Practitioner

## 2013-11-13 ENCOUNTER — Telehealth: Payer: Self-pay | Admitting: Nurse Practitioner

## 2013-11-13 DIAGNOSIS — C801 Malignant (primary) neoplasm, unspecified: Secondary | ICD-10-CM

## 2013-11-13 MED ORDER — LEVOTHYROXINE SODIUM 25 MCG PO TABS: 25.0000 ug | ORAL_TABLET | Freq: Every day | ORAL | Status: DC

## 2013-11-13 NOTE — Progress Notes (Signed)
Hematology and Oncology Follow Up Visit  Brandon [name] [id number] [birth year] 63 y.o. 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  Petties is back for his first office visit. He was originally seen at the main [facility]. He was last seen back in August of 2013. That he has stage III squamous cell carcinoma left tonsil. He is HPV positive. He was to with radiation and chemotherapy. He was given cis-platinum with radiation.  He then was switched to carboplatinum and Taxol. He does have some renal insufficiency issues.  He finished radiation and chemotherapy in March of 2011.  He is in remission. I would have to say that he is cured.  He still has a hard time swallowing heart heavy food. He still has on son with meats. He's had no nausea vomiting. He's had no cough. He's had no shortness of breath. He's had occasional your pain on the left side.  He's gain some weight. He does have some fatigue.  He is not smoking. He is still drinking some alcoholic  beverages daily. He and his wife are quite tanned. He does not wear a sunscreen when he is outside. I really talked to him about this. I told outport and it was that he do this as he is at risk for skin cancer because of his history of a throat cancer and the radiation that he had.  He's had no leg swelling. He had no rashes. His had no bleeding.  Overall, his performance status is ECOG 0.  Medications: Current outpatient prescriptions:gabapentin (NEURONTIN) 100 MG capsule, Take 3 capsules (300 mg total) by mouth at bedtime., Disp: 270 capsule, Rfl: 3;  ibuprofen (ADVIL,MOTRIN) 200 MG tablet, Take 200 mg by mouth every 6 (six) hours as needed., Disp: , Rfl: ;  omeprazole (PRILOSEC) 40 MG capsule, TAKE 1 CAPSULE DAILY, Disp: 90 capsule, Rfl: 3 zolpidem (AMBIEN) 10 MG tablet, Take 1 tablet (10 mg total) by mouth at  bedtime as needed., Disp: 90 tablet, Rfl: 1;  levothyroxine (LEVOTHROID) 25 MCG tablet, Take 1 tablet (25 mcg total) by mouth daily before breakfast., Disp: 30 tablet, Rfl: 6;  olmesartan (BENICAR) 40 MG tablet, Take 40 mg by mouth daily. PT D/C ON HIS OWN., Disp: , Rfl:   Allergies: No Known Allergies  Past Medical History, Surgical history, Social history, and Family History were reviewed and updated.  Review of Systems: As above  Physical Exam:  height is 5\' 10"  (1.778 m) and weight is 179 lb (81.194 kg). His oral temperature is 97.8 F (36.6 C). His blood pressure is 169/100 and his pulse is 57. His respiration is 18.   Well-developed and well-nourished [place] gentleman. He is alert and oriented x3. Head and neck exam shows some thickness of the skin on his neck. He has a lot of firmness of the neck. The lungs masses noted in the neck. Is no lymphadenopathy. Oral mucosa is dry. He has no mucositis. There is no petechia or bleeding in the oral cavity. Thyroid is nonpalpable. Lungs are clear bilaterally. Cardiac exam regular rhythm with no murmurs rubs or bruits. Abdomen is soft. Has good bowel sounds. There is no fluid wave. There is no palpable liver or spleen tip. Back exam shows no tenderness over the spine ribs or hips. Extremities shows no clubbing cyanosis or edema. He has good range of motion  of his joints. Skin exam shows some dryness of his skin. He has some scattered ecchymoses. He has no TGL. Neurological exam is nonfocal.  Lab Results  Component Value Date   WBC 5.1 11/12/2013   HGB 15.9 11/12/2013   HCT 44.6 11/12/2013   MCV 93 11/12/2013   PLT 285 11/12/2013     Chemistry      Component Value Date/Time   NA 142 11/12/2013 0852   NA 143 11/13/2012 0857   NA 143 11/13/2011 0906   K 4.3 11/12/2013 0852   K 3.5 11/13/2012 0857   K 3.6 11/13/2011 0906   CL 103 11/12/2013 0852   CL 102 11/13/2011 0906   CO2 28 11/12/2013 0852   CO2 24 11/13/2012 0857   CO2 29 11/13/2011 0906   BUN 26* 11/12/2013 0852    BUN 27.7* 11/13/2012 0857   BUN 27* 11/13/2011 0906   CREATININE 0.93 11/12/2013 0852   CREATININE 0.9 11/13/2012 0857   CREATININE 1.0 11/13/2011 0906      Component Value Date/Time   CALCIUM 9.6 11/12/2013 0852   CALCIUM 9.8 11/13/2012 0857   CALCIUM 9.3 11/13/2011 0906   ALKPHOS 61 11/12/2013 0852   ALKPHOS 63 11/13/2012 0857   ALKPHOS 60 11/13/2011 0906   AST 20 11/12/2013 0852   AST 21 11/13/2012 0857   AST 27 11/13/2011 0906   ALT 14 11/12/2013 0852   ALT 16 11/13/2012 0857   ALT 23 11/13/2011 0906   BILITOT 0.4 11/12/2013 0852   BILITOT 0.59 11/13/2012 0857   BILITOT 0.90 11/13/2011 0906     TSH is 8.34.   Impression and Plan: [name] is a 63 year old gentleman with a history of stage III squamous cell carcinoma of the left tonsil. He underwent curative therapy with radiation and chemotherapy. Again, all NSAID he is cured. After 4 years, I think the risk of recurrence would be minimal.  I think that hypothyroidism is going to be a problem. His TSH is up. I think that we probably should get him on Synthroid. I will put him on 0.025 mg. I this would be appropriate for him.  Exam his TSH rechecked in 6 weeks. We will get him back in 6 weeks to see how is doing with the Synthroid.  He may need to have another scan done of his head and neck. This is for routine followup.  We spent a good 45 minutes. with him and his wife.   Volanda Napoleon, MD 8/6/20155:38 PM

## 2013-11-13 NOTE — Telephone Encounter (Addendum)
Message copied by Jimmy Footman on Thu Nov 13, 2013 11:41 AM ------      Message from: Burney Gauze R      Created: Wed Nov 12, 2013  6:16 PM       Call - thyroid is starting to fade out on him and lose its function.  Need to start Synthroid 0.025mg  po q day.  Needs to have his TSH re-checked in 6 weeks!!  This is very common with radiation therapy!!  [name] ------Pt verbalized understanding and is aware of the medication being called in to his pharmacy and his lab appointment on September 18th.

## 2013-12-26 ENCOUNTER — Other Ambulatory Visit (HOSPITAL_BASED_OUTPATIENT_CLINIC_OR_DEPARTMENT_OTHER): Payer: 59 | Admitting: Lab

## 2013-12-26 DIAGNOSIS — R590 Localized enlarged lymph nodes: Secondary | ICD-10-CM

## 2013-12-26 DIAGNOSIS — E039 Hypothyroidism, unspecified: Secondary | ICD-10-CM

## 2013-12-26 DIAGNOSIS — C801 Malignant (primary) neoplasm, unspecified: Secondary | ICD-10-CM

## 2013-12-26 DIAGNOSIS — C099 Malignant neoplasm of tonsil, unspecified: Secondary | ICD-10-CM

## 2013-12-26 DIAGNOSIS — E559 Vitamin D deficiency, unspecified: Secondary | ICD-10-CM

## 2013-12-26 DIAGNOSIS — Z0000 Encounter for general adult medical examination without abnormal findings: Secondary | ICD-10-CM

## 2013-12-26 LAB — CBC WITH DIFFERENTIAL (CANCER CENTER ONLY)
BASO#: 0 10*3/uL (ref 0.0–0.2)
BASO%: 0.5 % (ref 0.0–2.0)
EOS%: 1.6 % (ref 0.0–7.0)
Eosinophils Absolute: 0.1 10*3/uL (ref 0.0–0.5)
HEMATOCRIT: 40.1 % (ref 38.7–49.9)
HGB: 14.5 g/dL (ref 13.0–17.1)
LYMPH#: 1.2 10*3/uL (ref 0.9–3.3)
LYMPH%: 22.4 % (ref 14.0–48.0)
MCH: 33.3 pg (ref 28.0–33.4)
MCHC: 36.2 g/dL — AB (ref 32.0–35.9)
MCV: 92 fL (ref 82–98)
MONO#: 0.4 10*3/uL (ref 0.1–0.9)
MONO%: 6.5 % (ref 0.0–13.0)
NEUT#: 3.8 10*3/uL (ref 1.5–6.5)
NEUT%: 69 % (ref 40.0–80.0)
Platelets: 284 10*3/uL (ref 145–400)
RBC: 4.35 10*6/uL (ref 4.20–5.70)
RDW: 13.5 % (ref 11.1–15.7)
WBC: 5.5 10*3/uL (ref 4.0–10.0)

## 2013-12-26 LAB — COMPREHENSIVE METABOLIC PANEL
ALK PHOS: 62 U/L (ref 39–117)
ALT: 20 U/L (ref 0–53)
AST: 25 U/L (ref 0–37)
Albumin: 4.3 g/dL (ref 3.5–5.2)
BUN: 25 mg/dL — AB (ref 6–23)
CO2: 26 mEq/L (ref 19–32)
Calcium: 9.8 mg/dL (ref 8.4–10.5)
Chloride: 103 mEq/L (ref 96–112)
Creatinine, Ser: 0.95 mg/dL (ref 0.50–1.35)
GLUCOSE: 87 mg/dL (ref 70–99)
Potassium: 4 mEq/L (ref 3.5–5.3)
Sodium: 141 mEq/L (ref 135–145)
Total Bilirubin: 0.4 mg/dL (ref 0.2–1.2)
Total Protein: 6.8 g/dL (ref 6.0–8.3)

## 2013-12-27 LAB — VITAMIN D 25 HYDROXY (VIT D DEFICIENCY, FRACTURES): Vit D, 25-Hydroxy: 58 ng/mL (ref 30–89)

## 2013-12-29 LAB — TSH CHCC: TSH: 4.283 m(IU)/L — ABNORMAL HIGH (ref 0.320–4.118)

## 2013-12-30 ENCOUNTER — Other Ambulatory Visit: Payer: Self-pay | Admitting: *Deleted

## 2013-12-30 ENCOUNTER — Encounter: Payer: Self-pay | Admitting: *Deleted

## 2013-12-30 DIAGNOSIS — C801 Malignant (primary) neoplasm, unspecified: Secondary | ICD-10-CM

## 2013-12-30 MED ORDER — LEVOTHYROXINE SODIUM 25 MCG PO TABS: 25.0000 ug | ORAL_TABLET | Freq: Every day | ORAL | Status: DC

## 2014-01-21 ENCOUNTER — Encounter: Payer: Self-pay | Admitting: Internal Medicine

## 2014-02-12 ENCOUNTER — Other Ambulatory Visit: Payer: Self-pay | Admitting: *Deleted

## 2014-02-12 DIAGNOSIS — C099 Malignant neoplasm of tonsil, unspecified: Secondary | ICD-10-CM

## 2014-02-13 ENCOUNTER — Encounter: Payer: Self-pay | Admitting: Hematology & Oncology

## 2014-02-13 ENCOUNTER — Ambulatory Visit (HOSPITAL_BASED_OUTPATIENT_CLINIC_OR_DEPARTMENT_OTHER): Payer: 59 | Admitting: Hematology & Oncology

## 2014-02-13 ENCOUNTER — Other Ambulatory Visit (HOSPITAL_BASED_OUTPATIENT_CLINIC_OR_DEPARTMENT_OTHER): Payer: 59 | Admitting: Lab

## 2014-02-13 ENCOUNTER — Ambulatory Visit (HOSPITAL_BASED_OUTPATIENT_CLINIC_OR_DEPARTMENT_OTHER)
Admission: RE | Admit: 2014-02-13 | Discharge: 2014-02-13 | Disposition: A | Discharge status: Home / Self Care | Payer: 59 | Source: Ambulatory Visit | Attending: Diagnostic Radiology | Admitting: Diagnostic Radiology

## 2014-02-13 VITALS — BP 151/98 | HR 57 | Temp 98.6°F | Resp 18 | Ht 69.0 in | Wt 186.0 lb

## 2014-02-13 DIAGNOSIS — Z8501 Personal history of malignant neoplasm of esophagus: Secondary | ICD-10-CM | POA: Diagnosis not present

## 2014-02-13 DIAGNOSIS — C801 Malignant (primary) neoplasm, unspecified: Secondary | ICD-10-CM

## 2014-02-13 DIAGNOSIS — R599 Enlarged lymph nodes, unspecified: Secondary | ICD-10-CM | POA: Diagnosis not present

## 2014-02-13 DIAGNOSIS — E039 Hypothyroidism, unspecified: Secondary | ICD-10-CM | POA: Diagnosis not present

## 2014-02-13 DIAGNOSIS — Z0000 Encounter for general adult medical examination without abnormal findings: Secondary | ICD-10-CM | POA: Insufficient documentation

## 2014-02-13 DIAGNOSIS — G4700 Insomnia, unspecified: Secondary | ICD-10-CM

## 2014-02-13 DIAGNOSIS — C099 Malignant neoplasm of tonsil, unspecified: Secondary | ICD-10-CM

## 2014-02-13 DIAGNOSIS — E559 Vitamin D deficiency, unspecified: Secondary | ICD-10-CM | POA: Insufficient documentation

## 2014-02-13 DIAGNOSIS — R1310 Dysphagia, unspecified: Secondary | ICD-10-CM

## 2014-02-13 DIAGNOSIS — I712 Thoracic aortic aneurysm, without rupture: Secondary | ICD-10-CM | POA: Insufficient documentation

## 2014-02-13 DIAGNOSIS — R590 Localized enlarged lymph nodes: Secondary | ICD-10-CM

## 2014-02-13 LAB — CBC WITH DIFFERENTIAL (CANCER CENTER ONLY)
BASO#: 0 10*3/uL (ref 0.0–0.2)
BASO%: 0.6 % (ref 0.0–2.0)
EOS%: 6.2 % (ref 0.0–7.0)
Eosinophils Absolute: 0.3 10*3/uL (ref 0.0–0.5)
HCT: 40.6 % (ref 38.7–49.9)
HGB: 14.2 g/dL (ref 13.0–17.1)
LYMPH#: 1.3 10*3/uL (ref 0.9–3.3)
LYMPH%: 26.5 % (ref 14.0–48.0)
MCH: 32.3 pg (ref 28.0–33.4)
MCHC: 35 g/dL (ref 32.0–35.9)
MCV: 93 fL (ref 82–98)
MONO#: 0.5 10*3/uL (ref 0.1–0.9)
MONO%: 9 % (ref 0.0–13.0)
NEUT%: 57.7 % (ref 40.0–80.0)
NEUTROS ABS: 2.9 10*3/uL (ref 1.5–6.5)
PLATELETS: 332 10*3/uL (ref 145–400)
RBC: 4.39 10*6/uL (ref 4.20–5.70)
RDW: 14 % (ref 11.1–15.7)
WBC: 5 10*3/uL (ref 4.0–10.0)

## 2014-02-13 LAB — CMP (CANCER CENTER ONLY)
ALT: 20 U/L (ref 10–47)
AST: 22 U/L (ref 11–38)
Albumin: 3.7 g/dL (ref 3.3–5.5)
Alkaline Phosphatase: 60 U/L (ref 26–84)
BUN, Bld: 22 mg/dL (ref 7–22)
CALCIUM: 9.5 mg/dL (ref 8.0–10.3)
CHLORIDE: 98 meq/L (ref 98–108)
CO2: 28 mEq/L (ref 18–33)
Creat: 1 mg/dl (ref 0.6–1.2)
Glucose, Bld: 101 mg/dL (ref 73–118)
Potassium: 3.2 mEq/L — ABNORMAL LOW (ref 3.3–4.7)
Sodium: 140 mEq/L (ref 128–145)
TOTAL PROTEIN: 7.3 g/dL (ref 6.4–8.1)
Total Bilirubin: 0.6 mg/dl (ref 0.20–1.60)

## 2014-02-13 LAB — TSH CHCC: TSH: 6.486 m[IU]/L — AB (ref 0.320–4.118)

## 2014-02-13 MED ORDER — IOHEXOL 300 MG/ML  SOLN
80.0000 mL | Freq: Once | INTRAMUSCULAR | Status: AC | PRN
  Administered 2014-02-13: 80 mL via INTRAVENOUS

## 2014-02-13 NOTE — Progress Notes (Signed)
Hematology and Oncology Follow Up Visit  HAVEN FOSS [id number] 19-Jul-1950 63 y.o. 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  Hoard is back for follow-up. He is doing okay. I foresee, he is having some problems with swallowing. There is some dysphagia. It is possible he may have a radiation stricture in the esophagus. As such, he will need a barium swallow.  He did have a CT scan done today. This was of his chest. This was to evaluate the mediastinal lymph nodes. The lymph nodes are normal in size. There is no evidence of metastatic disease. He does have a stable cyst on the left kidney. His appetite has been okay. He has felt a little bit more tired. He now is on Synthroid. We will see what his TSH is.  There's no change in bowel or bladder habits. He's had no problems with his skin. He does have a lot of firmness in the left neck because of past radiation.  Overall, his performance status is ECOG 0.  Medications: Current outpatient prescriptions: aspirin 81 MG tablet, Take 81 mg by mouth daily., Disp: , Rfl: ;  gabapentin (NEURONTIN) 100 MG capsule, Take 3 capsules (300 mg total) by mouth at bedtime., Disp: 270 capsule, Rfl: 3;  ibuprofen (ADVIL,MOTRIN) 200 MG tablet, Take 200 mg by mouth every 6 (six) hours as needed., Disp: , Rfl:  levothyroxine (LEVOTHROID) 25 MCG tablet, Take 1 tablet (25 mcg total) by mouth daily before breakfast., Disp: 90 tablet, Rfl: 2;  olmesartan (BENICAR) 40 MG tablet, Take 40 mg by mouth daily. , Disp: , Rfl: ;  omeprazole (PRILOSEC) 40 MG capsule, TAKE 1 CAPSULE DAILY, Disp: 90 capsule, Rfl: 3;  zolpidem (AMBIEN) 10 MG tablet, Take 1 tablet (10 mg total) by mouth at bedtime as needed., Disp: 90 tablet, Rfl: 1  Allergies: No Known Allergies  Past Medical History, Surgical history, Social history, and Family History were reviewed  and updated.  Review of Systems: As above  Physical Exam:  height is 5\' 9"  (1.753 m) and weight is 186 lb (84.369 kg). His oral temperature is 98.6 F (37 C). His blood pressure is 151/98 and his pulse is 57. His respiration is 18.   Well-developed and well-nourished [place] gentleman. He is alert and oriented x3. Head and neck exam shows some thickness of the skin on his neck. He has a lot of firmness of the neck. . Oral mucosa is moist.. He has no mucositis. There is no petechia or bleeding in the oral cavity. Thyroid is nonpalpable. Lungs are clear bilaterally. Cardiac exam regular rhythm with no murmurs rubs or bruits. Abdomen is soft. Has good bowel sounds. There is no fluid wave. There is no palpable liver or spleen tip.he does have a small hernia at the site of his feeding tube in the left upper abdomen. Back exam shows no tenderness over the spine ribs or hips. Extremities shows no clubbing cyanosis or edema. He has good range of motion of his joints. Skin exam shows some dryness of his skin. He has some scattered ecchymoses.. Neurological exam is nonfocal.  Lab Results  Component Value Date   WBC 5.0 02/13/2014   HGB 14.2 02/13/2014   HCT 40.6 02/13/2014   MCV 93 02/13/2014   PLT 332 02/13/2014     Chemistry      Component Value Date/Time  NA 140 02/13/2014 0824   NA 141 12/26/2013 1357   NA 143 11/13/2012 0857   K 3.2* 02/13/2014 0824   K 4.0 12/26/2013 1357   K 3.5 11/13/2012 0857   CL 98 02/13/2014 0824   CL 103 12/26/2013 1357   CO2 28 02/13/2014 0824   CO2 26 12/26/2013 1357   CO2 24 11/13/2012 0857   BUN 22 02/13/2014 0824   BUN 25* 12/26/2013 1357   BUN 27.7* 11/13/2012 0857   CREATININE 1.0 02/13/2014 0824   CREATININE 0.95 12/26/2013 1357   CREATININE 0.9 11/13/2012 0857      Component Value Date/Time   CALCIUM 9.5 02/13/2014 0824   CALCIUM 9.8 12/26/2013 1357   CALCIUM 9.8 11/13/2012 0857   ALKPHOS 60 02/13/2014 0824   ALKPHOS 62 12/26/2013 1357    ALKPHOS 63 11/13/2012 0857   AST 22 02/13/2014 0824   AST 25 12/26/2013 1357   AST 21 11/13/2012 0857   ALT 20 02/13/2014 0824   ALT 20 12/26/2013 1357   ALT 16 11/13/2012 0857   BILITOT 0.60 02/13/2014 0824   BILITOT 0.4 12/26/2013 1357   BILITOT 0.59 11/13/2012 0857        Impression and Plan: Mr. Vallier is a 63 year old gentleman with a history of stage III squamous cell carcinoma of the left tonsil. He underwent curative therapy with radiation and chemotherapy. Again, I think that the chance of recurrence would be less than 10%.   We will have to see what the barium swallow shows. If there is any problem with stricture, he will have to go to gastroenterology.  I will plan to see back in about 4 months. I don't think we had to do anything before then.  When we see him back, we will also check a testosterone level on him. It would not surprise me if this was on the low side.  Volanda Napoleon, MD 11/6/201510:38 AM

## 2014-02-14 LAB — VITAMIN D 25 HYDROXY (VIT D DEFICIENCY, FRACTURES): Vit D, 25-Hydroxy: 61 ng/mL (ref 30–89)

## 2014-02-15 ENCOUNTER — Other Ambulatory Visit: Payer: Self-pay | Admitting: Internal Medicine

## 2014-02-15 ENCOUNTER — Encounter: Payer: Self-pay | Admitting: Internal Medicine

## 2014-02-15 DIAGNOSIS — I719 Aortic aneurysm of unspecified site, without rupture: Secondary | ICD-10-CM

## 2014-02-16 ENCOUNTER — Telehealth: Payer: Self-pay | Admitting: *Deleted

## 2014-02-16 NOTE — Telephone Encounter (Addendum)
-----   Message from Volanda Napoleon, MD sent at 02/14/2014 12:42 PM EST ----- Please call and let him know that his thyroid test is low again. He needs to increase his Synthroid up to 0.40milligrams a day. Please send a prescription in for him. Pete -Informed pt that Dr Marin Olp wants him to increase his synthroid to 55mcg a day due to low TSH level. Pt verbalized understanding.

## 2014-02-17 ENCOUNTER — Telehealth: Payer: Self-pay | Admitting: Internal Medicine

## 2014-02-17 NOTE — Telephone Encounter (Signed)
Close encounter 

## 2014-02-23 ENCOUNTER — Ambulatory Visit (INDEPENDENT_AMBULATORY_CARE_PROVIDER_SITE_OTHER): Payer: 59 | Admitting: Internal Medicine

## 2014-02-23 ENCOUNTER — Encounter: Payer: Self-pay | Admitting: Internal Medicine

## 2014-02-23 ENCOUNTER — Ambulatory Visit (HOSPITAL_COMMUNITY)
Admission: RE | Admit: 2014-02-23 | Discharge: 2014-02-23 | Disposition: A | Discharge status: Home / Self Care | Payer: 59 | Source: Ambulatory Visit | Attending: Hematology & Oncology | Admitting: Hematology & Oncology

## 2014-02-23 VITALS — BP 122/80 | HR 85 | Temp 98.3°F | Resp 20 | Ht 69.0 in | Wt 187.5 lb

## 2014-02-23 DIAGNOSIS — K228 Other specified diseases of esophagus: Secondary | ICD-10-CM | POA: Diagnosis not present

## 2014-02-23 DIAGNOSIS — C140 Malignant neoplasm of pharynx, unspecified: Secondary | ICD-10-CM | POA: Insufficient documentation

## 2014-02-23 DIAGNOSIS — K449 Diaphragmatic hernia without obstruction or gangrene: Secondary | ICD-10-CM | POA: Insufficient documentation

## 2014-02-23 DIAGNOSIS — Z9221 Personal history of antineoplastic chemotherapy: Secondary | ICD-10-CM | POA: Diagnosis not present

## 2014-02-23 DIAGNOSIS — R1310 Dysphagia, unspecified: Secondary | ICD-10-CM | POA: Diagnosis present

## 2014-02-23 DIAGNOSIS — Z923 Personal history of irradiation: Secondary | ICD-10-CM | POA: Diagnosis not present

## 2014-02-23 DIAGNOSIS — I719 Aortic aneurysm of unspecified site, without rupture: Secondary | ICD-10-CM

## 2014-02-23 DIAGNOSIS — G4700 Insomnia, unspecified: Secondary | ICD-10-CM

## 2014-02-23 DIAGNOSIS — I10 Essential (primary) hypertension: Secondary | ICD-10-CM

## 2014-02-23 MED ORDER — NEBIVOLOL HCL 5 MG PO TABS: 5.0000 mg | ORAL_TABLET | Freq: Every day | ORAL | Status: DC

## 2014-02-23 NOTE — Progress Notes (Signed)
Subjective:    Patient ID: Brandon [name], male    DOB: [birth year], 63 y.o.   MRN: [id number]  Hypertension This is a chronic problem. The current episode started more than 1 year ago. The problem is unchanged. The problem is controlled. Associated symptoms include malaise/fatigue. Pertinent negatives include no anxiety, blurred vision, chest pain, headaches, neck pain, orthopnea, palpitations, peripheral edema, PND, shortness of breath or sweats. Agents associated with hypertension include NSAIDs. Past treatments include angiotensin blockers. The current treatment provides significant improvement. Compliance problems include medication side effects.  Hypertensive end-organ damage includes a thyroid problem.      Review of Systems  Constitutional: Positive for malaise/fatigue and fatigue. Negative for fever, chills, diaphoresis, activity change and appetite change.  HENT: Negative.   Eyes: Negative.  Negative for blurred vision and discharge.  Respiratory: Negative.  Negative for apnea, cough, choking, chest tightness, shortness of breath, wheezing and stridor.   Cardiovascular: Negative.  Negative for chest pain, palpitations, orthopnea, leg swelling and PND.  Gastrointestinal: Negative.  Negative for nausea, vomiting, abdominal pain, diarrhea, constipation and blood in stool.  Endocrine: Negative.   Genitourinary: Negative.   Musculoskeletal: Negative.  Negative for neck pain.  Skin: Negative.  Negative for rash.  Allergic/Immunologic: Negative.   Neurological: Negative.  Negative for headaches.  Hematological: Negative.  Negative for adenopathy. Does not bruise/bleed easily.  Psychiatric/Behavioral: Negative.        Objective:   Physical Exam  Constitutional: He is oriented to person, place, and time. He appears well-developed and well-nourished. No distress.  HENT:  Head: Normocephalic and atraumatic.  Mouth/Throat: Oropharynx is clear and moist and mucous membranes are  normal. Mucous membranes are not pale, not dry and not cyanotic. No oropharyngeal exudate, posterior oropharyngeal edema, posterior oropharyngeal erythema or tonsillar abscesses.  Eyes: Conjunctivae are normal. Right eye exhibits no discharge. Left eye exhibits no discharge. No scleral icterus.  Neck: Normal range of motion. Neck supple. No JVD present. No tracheal deviation present. No thyroid mass and no thyromegaly present.  Cardiovascular: Normal rate, regular rhythm, normal heart sounds and intact distal pulses.  Exam reveals no gallop and no friction rub.   No murmur heard. Pulmonary/Chest: Effort normal and breath sounds normal. No stridor. No respiratory distress. He has no wheezes. He has no rales. He exhibits no tenderness.  Abdominal: Soft. Bowel sounds are normal. He exhibits no distension and no mass. There is no tenderness. There is no rebound and no guarding.  Musculoskeletal: Normal range of motion. He exhibits no edema or tenderness.  Lymphadenopathy:    He has no cervical adenopathy.  Neurological: He is oriented to person, place, and time.  Skin: Skin is warm and dry. No rash noted. He is not diaphoretic. No erythema. No pallor.  Psychiatric: He has a normal mood and affect. His behavior is normal. Judgment and thought content normal.  Vitals reviewed.    Lab Results  Component Value Date   WBC 5.0 02/13/2014   HGB 14.2 02/13/2014   HCT 40.6 02/13/2014   PLT 332 02/13/2014   GLUCOSE 101 02/13/2014   CHOL 210* 07/30/2013   TRIG 67.0 07/30/2013   HDL 65.80 07/30/2013   LDLDIRECT 147.7 12/03/2007   LDLCALC 131* 07/30/2013   ALT 20 02/13/2014   AST 22 02/13/2014   NA 140 02/13/2014   K 3.2* 02/13/2014   CL 98 02/13/2014   CREATININE 1.0 02/13/2014   BUN 22 02/13/2014   CO2 28 02/13/2014   TSH 6.486* 02/13/2014  PSA 0.80 07/30/2013   INR 1.0 09/01/2008   HGBA1C 5.5 07/30/2013       Assessment & Plan:

## 2014-02-23 NOTE — Assessment & Plan Note (Signed)
He complains that benicar causes fatigue, will change to bystolic

## 2014-02-23 NOTE — Assessment & Plan Note (Signed)
Will change benicar to bystolic to reduce shear force He sees cardiology soon about this

## 2014-02-23 NOTE — Progress Notes (Signed)
Pre visit review using our clinic review tool, if applicable. No additional management support is needed unless otherwise documented below in the visit note. 

## 2014-02-23 NOTE — Patient Instructions (Signed)

## 2014-02-24 ENCOUNTER — Telehealth: Payer: Self-pay | Admitting: *Deleted

## 2014-02-24 NOTE — Telephone Encounter (Addendum)
Message left  ----- Message from Volanda Napoleon, MD sent at 02/23/2014  5:56 PM EST ----- Please call and tell him that there is no locking is or narrowings in the esophagus. [name]

## 2014-02-27 ENCOUNTER — Ambulatory Visit: Payer: 59 | Admitting: Internal Medicine

## 2014-03-02 ENCOUNTER — Other Ambulatory Visit: Payer: Self-pay | Admitting: Internal Medicine

## 2014-03-02 ENCOUNTER — Encounter: Payer: Self-pay | Admitting: Internal Medicine

## 2014-03-02 DIAGNOSIS — I719 Aortic aneurysm of unspecified site, without rupture: Secondary | ICD-10-CM

## 2014-03-02 DIAGNOSIS — I10 Essential (primary) hypertension: Secondary | ICD-10-CM

## 2014-03-02 MED ORDER — NEBIVOLOL HCL 5 MG PO TABS: 5.0000 mg | ORAL_TABLET | Freq: Every day | ORAL | Status: DC

## 2014-03-11 ENCOUNTER — Encounter: Payer: Self-pay | Admitting: Hematology & Oncology

## 2014-03-12 ENCOUNTER — Other Ambulatory Visit: Payer: Self-pay | Admitting: *Deleted

## 2014-03-12 DIAGNOSIS — IMO0002 Reserved for concepts with insufficient information to code with codable children: Secondary | ICD-10-CM

## 2014-03-12 MED ORDER — LEVOTHYROXINE SODIUM 50 MCG PO TABS: 50.0000 ug | ORAL_TABLET | Freq: Every day | ORAL | Status: DC

## 2014-03-13 ENCOUNTER — Ambulatory Visit (INDEPENDENT_AMBULATORY_CARE_PROVIDER_SITE_OTHER): Payer: 59 | Admitting: Internal Medicine

## 2014-03-13 ENCOUNTER — Encounter: Payer: Self-pay | Admitting: Internal Medicine

## 2014-03-13 VITALS — BP 126/84 | HR 54 | Ht 70.0 in | Wt 187.2 lb

## 2014-03-13 DIAGNOSIS — F17200 Nicotine dependence, unspecified, uncomplicated: Secondary | ICD-10-CM

## 2014-03-13 DIAGNOSIS — I712 Thoracic aortic aneurysm, without rupture, unspecified: Secondary | ICD-10-CM | POA: Insufficient documentation

## 2014-03-13 DIAGNOSIS — I10 Essential (primary) hypertension: Secondary | ICD-10-CM

## 2014-03-13 DIAGNOSIS — Z720 Tobacco use: Secondary | ICD-10-CM

## 2014-03-13 DIAGNOSIS — I719 Aortic aneurysm of unspecified site, without rupture: Secondary | ICD-10-CM

## 2014-03-13 NOTE — Patient Instructions (Signed)
Your physician wants you to follow-up in: 1 year with Dr. Debara Pickett.  You will receive a reminder letter in the mail two months in advance. If you don't receive a letter, please call our office to schedule the follow-up appointment.  Dr. Debara Pickett would like you to have a CT angiogram of your chest/abdomen in 1 year, prior to your next office visit - at our [place]

## 2014-03-13 NOTE — Progress Notes (Signed)
OFFICE NOTE  Chief Complaint:  Referred for aortic aneurysm  Primary Care Physician: Scarlette Calico, MD  HPI:  Brandon [name] is a pleasant 63 year old male who goes by "skip". He has a history of throat cancer and received radiation and chemotherapy. Subsequently he is undergone screening CT scans and was noted to have some pulmonary nodules. There is a history of cigar smoking in the past. Recently his CT scan indicated a small ascending aortic aneurysm, measuring 4.1 cm. This was not previously reported. He is completely asymptomatic, denies any chest pain or shortness of breath. There is no history of aneurysm to his knowledge in the family. He does have borderline hypertension and was never treated with medications until recently. Based on the borderline elevated blood pressures and hypertension he was started on Bystolic, which she seems to be tolerating well. Blood pressure today is 126/84 and heart rate is well-controlled at 54.  PMHx:  Past Medical History  Diagnosis Date  . Head and neck cancer     head and neck ca dx 03/2009  . oropharyngeal ca dx'd 03/2009    chemo/xrt comp 06/2009    Past Surgical History  Procedure Laterality Date  . Hip replacemnt left    . Stmach surgery      repair peg tube site  . Left eye      FAMHx:  Family History  Problem Relation Age of Onset  . Cancer Mother     lymphoma  . Cancer Father     lung (smoker)  . Hyperlipidemia Father   . Hypertension Father   . Diabetes Father     SOCHx:   reports that he quit smoking about 4 years ago. His smoking use included Cigarettes and Cigars. He started smoking about 40 years ago. He has a 52.5 pack-year smoking history. He has never used smokeless tobacco. He reports that he drinks about 9.0 oz of alcohol per week. He reports that he does not use illicit drugs.  ALLERGIES:  Allergies  Allergen Reactions  . Benicar [Olmesartan]     fatigue    ROS: A comprehensive review of systems was  negative.  HOME MEDS: Current Outpatient Prescriptions  Medication Sig Dispense Refill  . aspirin 81 MG tablet Take 81 mg by mouth daily.    Marland Kitchen gabapentin (NEURONTIN) 100 MG capsule Take 3 capsules (300 mg total) by mouth at bedtime. 270 capsule 3  . ibuprofen (ADVIL,MOTRIN) 200 MG tablet Take 200 mg by mouth every 6 (six) hours as needed.    Marland Kitchen levothyroxine (SYNTHROID, LEVOTHROID) 50 MCG tablet Take 1 tablet (50 mcg total) by mouth daily before breakfast. 90 tablet 2  . nebivolol (BYSTOLIC) 5 MG tablet Take 1 tablet (5 mg total) by mouth daily. 30 tablet 5  . omeprazole (PRILOSEC) 40 MG capsule TAKE 1 CAPSULE DAILY 90 capsule 3  . zolpidem (AMBIEN) 10 MG tablet Take 1 tablet (10 mg total) by mouth at bedtime as needed. 90 tablet 1   No current facility-administered medications for this visit.    LABS/IMAGING: No results found for this or any previous visit (from the past 48 hour(s)). No results found.  VITALS: BP 126/84 mmHg  Pulse 54  Ht 5\' 10"  (1.778 m)  Wt 187 lb 3.2 oz (84.913 kg)  BMI 26.86 kg/m2  EXAM: General appearance: alert and no distress Neck: no carotid bruit and no JVD Lungs: clear to auscultation bilaterally Heart: regular rate and rhythm, S1, S2 normal, no murmur, click, rub  or gallop Abdomen: soft, non-tender; bowel sounds normal; no masses,  no organomegaly Extremities: extremities normal, atraumatic, no cyanosis or edema Pulses: 2+ and symmetric Skin: Skin color, texture, turgor normal. No rashes or lesions Neurologic: Grossly normal Psych: Normal  EKG: Sinus bradycardia at 54  ASSESSMENT: 1. Mild ascending aortic aneurysm-4.1 cm 2. History of tobacco abuse-resolved 3. Hypertension-at goal  PLAN: 1.   Mr. Laban has a new finding of mild ascending aortic aneurysm measuring 4.1 cm. This was either never identified or reported in the past, however it is now present. There is no history of aortic aneurysm in the family. I agree with treatment of his  blood pressure and Bystolic is a good choice as he currently has good blood pressure control and low normal heart rate which should reduce the risk of aneurysm expansion over time. He has stopped smoking in the distant past. There really are very few other treatments that could help slow the progression of enlargement of his aneurysm. I would recommend a repeat CT aortogram in one year. This would be helpful to look for stability. In the future, we may need to consider echocardiography as an option to reduce his radiation exposure if we are able to easily visualize the ascending aorta and used that test to follow.  Thank you for the kind referral.  Pixie Casino, MD, [facility] Attending Cardiologist [facility]  Lashun Mccants C 03/13/2014, 2:55 PM

## 2014-04-13 ENCOUNTER — Encounter: Payer: Self-pay | Admitting: Internal Medicine

## 2014-04-13 ENCOUNTER — Other Ambulatory Visit: Payer: Self-pay | Admitting: Internal Medicine

## 2014-04-13 DIAGNOSIS — I10 Essential (primary) hypertension: Secondary | ICD-10-CM

## 2014-04-13 MED ORDER — CARVEDILOL 3.125 MG PO TABS: 3.1250 mg | ORAL_TABLET | Freq: Two times a day (BID) | ORAL | Status: DC

## 2014-05-27 ENCOUNTER — Encounter: Payer: Self-pay | Admitting: Internal Medicine

## 2014-05-27 ENCOUNTER — Other Ambulatory Visit: Payer: Self-pay | Admitting: Internal Medicine

## 2014-05-27 DIAGNOSIS — G4700 Insomnia, unspecified: Secondary | ICD-10-CM

## 2014-05-27 DIAGNOSIS — I10 Essential (primary) hypertension: Secondary | ICD-10-CM

## 2014-05-27 MED ORDER — CARVEDILOL 3.125 MG PO TABS: 3.1250 mg | ORAL_TABLET | Freq: Two times a day (BID) | ORAL | Status: DC

## 2014-05-27 MED ORDER — ZOLPIDEM TARTRATE 10 MG PO TABS: 10.0000 mg | ORAL_TABLET | Freq: Every evening | ORAL | Status: DC | PRN

## 2014-06-12 ENCOUNTER — Other Ambulatory Visit (HOSPITAL_BASED_OUTPATIENT_CLINIC_OR_DEPARTMENT_OTHER): Payer: 59 | Admitting: Lab

## 2014-06-12 ENCOUNTER — Ambulatory Visit (HOSPITAL_BASED_OUTPATIENT_CLINIC_OR_DEPARTMENT_OTHER): Payer: 59 | Admitting: Hematology & Oncology

## 2014-06-12 VITALS — BP 140/95 | HR 56 | Temp 98.1°F | Wt 186.0 lb

## 2014-06-12 DIAGNOSIS — Z9221 Personal history of antineoplastic chemotherapy: Secondary | ICD-10-CM

## 2014-06-12 DIAGNOSIS — R1310 Dysphagia, unspecified: Secondary | ICD-10-CM

## 2014-06-12 DIAGNOSIS — B977 Papillomavirus as the cause of diseases classified elsewhere: Secondary | ICD-10-CM

## 2014-06-12 DIAGNOSIS — I10 Essential (primary) hypertension: Secondary | ICD-10-CM

## 2014-06-12 DIAGNOSIS — G4700 Insomnia, unspecified: Secondary | ICD-10-CM

## 2014-06-12 DIAGNOSIS — Z923 Personal history of irradiation: Secondary | ICD-10-CM

## 2014-06-12 DIAGNOSIS — Z8579 Personal history of other malignant neoplasms of lymphoid, hematopoietic and related tissues: Secondary | ICD-10-CM

## 2014-06-12 LAB — CMP (CANCER CENTER ONLY)
ALT(SGPT): 16 U/L (ref 10–47)
AST: 23 U/L (ref 11–38)
Albumin: 3.8 g/dL (ref 3.3–5.5)
Alkaline Phosphatase: 66 U/L (ref 26–84)
BILIRUBIN TOTAL: 0.6 mg/dL (ref 0.20–1.60)
BUN, Bld: 26 mg/dL — ABNORMAL HIGH (ref 7–22)
CALCIUM: 9.4 mg/dL (ref 8.0–10.3)
CO2: 31 mEq/L (ref 18–33)
Chloride: 99 mEq/L (ref 98–108)
Creat: 1 mg/dl (ref 0.6–1.2)
Glucose, Bld: 95 mg/dL (ref 73–118)
Potassium: 4 mEq/L (ref 3.3–4.7)
SODIUM: 145 meq/L (ref 128–145)
TOTAL PROTEIN: 7.7 g/dL (ref 6.4–8.1)

## 2014-06-12 LAB — CBC WITH DIFFERENTIAL (CANCER CENTER ONLY)
BASO#: 0.1 10*3/uL (ref 0.0–0.2)
BASO%: 1 % (ref 0.0–2.0)
EOS ABS: 0.2 10*3/uL (ref 0.0–0.5)
EOS%: 3.7 % (ref 0.0–7.0)
HCT: 44.3 % (ref 38.7–49.9)
HGB: 15.3 g/dL (ref 13.0–17.1)
LYMPH#: 1.2 10*3/uL (ref 0.9–3.3)
LYMPH%: 23.8 % (ref 14.0–48.0)
MCH: 31.4 pg (ref 28.0–33.4)
MCHC: 34.5 g/dL (ref 32.0–35.9)
MCV: 91 fL (ref 82–98)
MONO#: 0.7 10*3/uL (ref 0.1–0.9)
MONO%: 13 % (ref 0.0–13.0)
NEUT%: 58.5 % (ref 40.0–80.0)
NEUTROS ABS: 3 10*3/uL (ref 1.5–6.5)
Platelets: 335 10*3/uL (ref 145–400)
RBC: 4.88 10*6/uL (ref 4.20–5.70)
RDW: 14.1 % (ref 11.1–15.7)
WBC: 5.1 10*3/uL (ref 4.0–10.0)

## 2014-06-12 LAB — TESTOSTERONE: Testosterone: 292 ng/dL — ABNORMAL LOW (ref 300–890)

## 2014-06-12 LAB — TSH CHCC: TSH: 2.685 m(IU)/L (ref 0.320–4.118)

## 2014-06-12 NOTE — Progress Notes (Signed)
Hematology and Oncology Follow Up Visit  Brandon [name] [id number] 07/29/1950 64 y.o. 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 He is doing better. When last saw him, he was having some issues with dysphagia. We did do a barium swallow. This not show any esophageal stricture.Marland Kitchen He still has some dysphagia. He is eating okay. He's had no problems with nausea or vomiting.  He's had no cough. He's had no shortness of breath.  He did have a CT scan done back in November. This was of the chest. Everything looked okay.  He is working without difficulty. 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 It's amazing when he does. He showed me photographs.  He's had no change in bowel or bladder habits.  He's had no fever.  He wants go down to the care being. He has wife Goodenow every year. Unfortunately, she needs to have some surgery.  Overall, his performance status is ECOG 0.  Medications:  Current outpatient prescriptions:  .  aspirin 81 MG tablet, Take 81 mg by mouth daily., Disp: , Rfl:  .  carvedilol (COREG) 3.125 MG tablet, Take 1 tablet (3.125 mg total) by mouth 2 (two) times daily with a meal., Disp: 180 tablet, Rfl: 3 .  gabapentin (NEURONTIN) 100 MG capsule, Take 3 capsules (300 mg total) by mouth at bedtime., Disp: 270 capsule, Rfl: 3 .  ibuprofen (ADVIL,MOTRIN) 200 MG tablet, Take 200 mg by mouth every 6 (six) hours as needed., Disp: , Rfl:  .  levothyroxine (SYNTHROID, LEVOTHROID) 50 MCG tablet, Take 1 tablet (50 mcg total) by mouth daily before breakfast., Disp: 90 tablet, Rfl: 2 .  omeprazole (PRILOSEC) 40 MG capsule, TAKE 1 CAPSULE DAILY, Disp: 90 capsule, Rfl: 3 .  zolpidem (AMBIEN) 10 MG tablet, Take 1 tablet (10 mg total) by mouth at bedtime as needed., Disp: 90 tablet, Rfl: 1  Allergies:   Allergies  Allergen Reactions  . Benicar [Olmesartan]     fatigue    Past Medical History, Surgical history, Social history, and Family History were reviewed and updated.  Review of Systems: As above  Physical Exam:  weight is 186 lb (84.369 kg). His oral temperature is 98.1 F (36.7 C). His blood pressure is 140/95 and his pulse is 56.   Well-developed and well-nourished Brandon [name] gentleman. He is alert and oriented x3. Head and neck exam shows some thickness of the skin on his neck. He has a lot of firmness of the neck. . Oral mucosa is moist.. He has no mucositis. There is no petechia or bleeding in the oral cavity. Thyroid is nonpalpable. Lungs are clear bilaterally. Cardiac exam regular rate and rhythm with no murmurs rubs or bruits. Abdomen is soft. Has good bowel sounds. There is no fluid wave. There is no palpable liver or spleen tip.he does have a small hernia at the site of his feeding tube in the left upper abdomen. Back exam shows no tenderness over the spine ribs or hips. Extremities shows no clubbing cyanosis or edema. He has good range of motion of his joints. Skin exam shows some dryness of his skin. He has some scattered ecchymoses.. Neurological exam is nonfocal.  Lab Results  Component Value Date   WBC 5.1 06/12/2014   HGB 15.3 06/12/2014   HCT  44.3 06/12/2014   MCV 91 06/12/2014   PLT 335 06/12/2014     Chemistry      Component Value Date/Time   NA 145 06/12/2014 0903   NA 141 12/26/2013 1357   NA 143 11/13/2012 0857   K 4.0 06/12/2014 0903   K 4.0 12/26/2013 1357   K 3.5 11/13/2012 0857   CL 99 06/12/2014 0903   CL 103 12/26/2013 1357   CO2 31 06/12/2014 0903   CO2 26 12/26/2013 1357   CO2 24 11/13/2012 0857   BUN 26* 06/12/2014 0903   BUN 25* 12/26/2013 1357   BUN 27.7* 11/13/2012 0857   CREATININE 1.0 06/12/2014 0903   CREATININE 0.95 12/26/2013 1357   CREATININE 0.9 11/13/2012 0857      Component Value Date/Time   CALCIUM 9.4 06/12/2014 0903    CALCIUM 9.8 12/26/2013 1357   CALCIUM 9.8 11/13/2012 0857   ALKPHOS 66 06/12/2014 0903   ALKPHOS 62 12/26/2013 1357   ALKPHOS 63 11/13/2012 0857   AST 23 06/12/2014 0903   AST 25 12/26/2013 1357   AST 21 11/13/2012 0857   ALT 16 06/12/2014 0903   ALT 20 12/26/2013 1357   ALT 16 11/13/2012 0857   BILITOT 0.60 06/12/2014 0903   BILITOT 0.4 12/26/2013 1357   BILITOT 0.59 11/13/2012 0857        Impression and Plan: Brandon [name] is a 64 year old gentleman with a history of stage III squamous cell carcinoma of the left tonsil. He underwent curative therapy with radiation and chemotherapy. He completed treatments back in March 2011. I think the chance of recurrence now is less than 5%.  I don't see that would have to do any scans on him. I try to reassure he and his wife that there is no indication that he needs any scans. His labs looked okay.  We will plan to get him back in 6 months.  Volanda Napoleon, MD 3/4/201610:21 AM

## 2014-06-15 ENCOUNTER — Telehealth: Payer: Self-pay | Admitting: *Deleted

## 2014-06-15 NOTE — Telephone Encounter (Addendum)
Aware of results from My Chart  ----- Message from Volanda Napoleon, MD sent at 06/13/2014 12:49 PM EST ----- Call- thyroid is ok!!  pete

## 2014-08-23 ENCOUNTER — Other Ambulatory Visit: Payer: Self-pay | Admitting: Internal Medicine

## 2014-09-09 ENCOUNTER — Encounter: Payer: Self-pay | Admitting: Internal Medicine

## 2014-09-09 ENCOUNTER — Telehealth: Payer: Self-pay | Admitting: Internal Medicine

## 2014-09-09 ENCOUNTER — Ambulatory Visit (INDEPENDENT_AMBULATORY_CARE_PROVIDER_SITE_OTHER): Payer: 59 | Admitting: Internal Medicine

## 2014-09-09 ENCOUNTER — Other Ambulatory Visit (INDEPENDENT_AMBULATORY_CARE_PROVIDER_SITE_OTHER): Payer: 59

## 2014-09-09 VITALS — BP 130/96 | HR 64 | Temp 98.4°F | Resp 16 | Ht 70.0 in | Wt 179.8 lb

## 2014-09-09 DIAGNOSIS — I10 Essential (primary) hypertension: Secondary | ICD-10-CM

## 2014-09-09 DIAGNOSIS — I712 Thoracic aortic aneurysm, without rupture, unspecified: Secondary | ICD-10-CM

## 2014-09-09 DIAGNOSIS — IMO0002 Reserved for concepts with insufficient information to code with codable children: Secondary | ICD-10-CM

## 2014-09-09 DIAGNOSIS — Z1211 Encounter for screening for malignant neoplasm of colon: Secondary | ICD-10-CM | POA: Insufficient documentation

## 2014-09-09 DIAGNOSIS — G4700 Insomnia, unspecified: Secondary | ICD-10-CM | POA: Diagnosis not present

## 2014-09-09 DIAGNOSIS — Z0000 Encounter for general adult medical examination without abnormal findings: Secondary | ICD-10-CM

## 2014-09-09 DIAGNOSIS — E890 Postprocedural hypothyroidism: Secondary | ICD-10-CM

## 2014-09-09 DIAGNOSIS — E785 Hyperlipidemia, unspecified: Secondary | ICD-10-CM | POA: Diagnosis not present

## 2014-09-09 DIAGNOSIS — E039 Hypothyroidism, unspecified: Secondary | ICD-10-CM | POA: Insufficient documentation

## 2014-09-09 LAB — CBC WITH DIFFERENTIAL/PLATELET
BASOS PCT: 0.6 % (ref 0.0–3.0)
Basophils Absolute: 0 10*3/uL (ref 0.0–0.1)
EOS PCT: 3.2 % (ref 0.0–5.0)
Eosinophils Absolute: 0.2 10*3/uL (ref 0.0–0.7)
HCT: 46.9 % (ref 39.0–52.0)
Hemoglobin: 16 g/dL (ref 13.0–17.0)
Lymphocytes Relative: 24.8 % (ref 12.0–46.0)
Lymphs Abs: 1.5 10*3/uL (ref 0.7–4.0)
MCHC: 34.1 g/dL (ref 30.0–36.0)
MCV: 93.8 fl (ref 78.0–100.0)
Monocytes Absolute: 0.6 10*3/uL (ref 0.1–1.0)
Monocytes Relative: 9.5 % (ref 3.0–12.0)
NEUTROS PCT: 61.9 % (ref 43.0–77.0)
Neutro Abs: 3.7 10*3/uL (ref 1.4–7.7)
Platelets: 305 10*3/uL (ref 150.0–400.0)
RBC: 4.99 Mil/uL (ref 4.22–5.81)
RDW: 15.2 % (ref 11.5–15.5)
WBC: 6 10*3/uL (ref 4.0–10.5)

## 2014-09-09 LAB — COMPREHENSIVE METABOLIC PANEL
ALT: 16 U/L (ref 0–53)
AST: 20 U/L (ref 0–37)
Albumin: 4.4 g/dL (ref 3.5–5.2)
Alkaline Phosphatase: 62 U/L (ref 39–117)
BUN: 33 mg/dL — ABNORMAL HIGH (ref 6–23)
CHLORIDE: 105 meq/L (ref 96–112)
CO2: 29 meq/L (ref 19–32)
Calcium: 9.7 mg/dL (ref 8.4–10.5)
Creatinine, Ser: 0.96 mg/dL (ref 0.40–1.50)
GFR: 84.24 mL/min (ref >60.00)
Glucose, Bld: 93 mg/dL (ref 70–99)
POTASSIUM: 4.4 meq/L (ref 3.5–5.1)
Sodium: 141 mEq/L (ref 135–145)
Total Bilirubin: 0.4 mg/dL (ref 0.2–1.2)
Total Protein: 7.1 g/dL (ref 6.0–8.3)

## 2014-09-09 LAB — URINALYSIS, ROUTINE W REFLEX MICROSCOPIC
Bilirubin Urine: NEGATIVE
Hgb urine dipstick: NEGATIVE
Ketones, ur: NEGATIVE
Leukocytes, UA: NEGATIVE
NITRITE: NEGATIVE
PH: 5.5 (ref 5.0–8.0)
RBC / HPF: NONE SEEN (ref 0–?)
Specific Gravity, Urine: >=1.03 — AB (ref 1.000–1.030)
TOTAL PROTEIN, URINE-UPE24: NEGATIVE
URINE GLUCOSE: NEGATIVE
Urobilinogen, UA: 0.2 (ref 0.0–1.0)
WBC, UA: NONE SEEN (ref 0–?)

## 2014-09-09 LAB — LIPID PANEL
CHOLESTEROL: 222 mg/dL — AB (ref 0–200)
HDL: 69.1 mg/dL (ref >39.00)
LDL Cholesterol: 137 mg/dL — ABNORMAL HIGH (ref 0–99)
NonHDL: 152.9
Total CHOL/HDL Ratio: 3
Triglycerides: 78 mg/dL (ref 0.0–149.0)
VLDL: 15.6 mg/dL (ref 0.0–40.0)

## 2014-09-09 LAB — PSA: PSA: 0.64 ng/mL (ref 0.10–4.00)

## 2014-09-09 LAB — TSH: TSH: 2.95 u[IU]/mL (ref 0.35–4.50)

## 2014-09-09 LAB — FECAL OCCULT BLOOD, GUAIAC: FECAL OCCULT BLD: NEGATIVE

## 2014-09-09 MED ORDER — LEVOTHYROXINE SODIUM 50 MCG PO TABS: 50.0000 ug | ORAL_TABLET | Freq: Every day | ORAL | Status: DC

## 2014-09-09 MED ORDER — ZOLPIDEM TARTRATE 10 MG PO TABS: 10.0000 mg | ORAL_TABLET | Freq: Every evening | ORAL | Status: DC | PRN

## 2014-09-09 MED ORDER — CARVEDILOL 6.25 MG PO TABS: 6.2500 mg | ORAL_TABLET | Freq: Two times a day (BID) | ORAL | Status: DC

## 2014-09-09 NOTE — Progress Notes (Signed)
Subjective:  Patient ID: Brandon [name], male    DOB: [birth year]  Age: 64 y.o. MRN: [id number]  CC: Hypertension; Hypothyroidism; and Annual Exam   HPI Brandon [name] presents for a CPX , BP check, thyroid level and cholesterol advice. He feels well and offers no complaints.  Outpatient Prescriptions Prior to Visit  Medication Sig Dispense Refill  . aspirin 81 MG tablet Take 81 mg by mouth daily.    Marland Kitchen gabapentin (NEURONTIN) 100 MG capsule Take 3 capsules (300 mg total) by mouth at bedtime. 270 capsule 3  . omeprazole (PRILOSEC) 40 MG capsule TAKE 1 CAPSULE DAILY 90 capsule 2  . carvedilol (COREG) 3.125 MG tablet Take 1 tablet (3.125 mg total) by mouth 2 (two) times daily with a meal. 180 tablet 3  . ibuprofen (ADVIL,MOTRIN) 200 MG tablet Take 200 mg by mouth every 6 (six) hours as needed.    Marland Kitchen levothyroxine (SYNTHROID, LEVOTHROID) 50 MCG tablet Take 1 tablet (50 mcg total) by mouth daily before breakfast. 90 tablet 2  . zolpidem (AMBIEN) 10 MG tablet Take 1 tablet (10 mg total) by mouth at bedtime as needed. 90 tablet 1   No facility-administered medications prior to visit.    ROS Review of Systems  Constitutional: Negative.  Negative for fever, chills, diaphoresis, activity change, appetite change, fatigue and unexpected weight change.  HENT: Negative.  Negative for trouble swallowing and voice change.   Eyes: Negative.   Respiratory: Negative.  Negative for apnea, cough, choking, chest tightness, shortness of breath, wheezing and stridor.   Cardiovascular: Negative.  Negative for chest pain, palpitations and leg swelling.  Gastrointestinal: Negative.  Negative for nausea, vomiting, abdominal pain, diarrhea, constipation and blood in stool.  Endocrine: Negative.   Genitourinary: Negative.  Negative for dysuria, urgency, hematuria, decreased urine volume and difficulty urinating.  Musculoskeletal: Negative.  Negative for myalgias, back pain, joint swelling, arthralgias and neck  pain.  Skin: Negative.   Allergic/Immunologic: Negative.   Neurological: Negative.  Negative for dizziness, tremors, syncope, speech difficulty, weakness, light-headedness and numbness.  Hematological: Negative.  Negative for adenopathy. Does not bruise/bleed easily.  Psychiatric/Behavioral: Positive for sleep disturbance. Negative for suicidal ideas, hallucinations, behavioral problems, confusion, dysphoric mood, decreased concentration and agitation. The patient is not nervous/anxious and is not hyperactive.     Objective:  BP 130/96 mmHg  Pulse 64  Temp(Src) 98.4 F (36.9 C) (Oral)  Resp 16  Ht 5\' 10"  (1.778 m)  Wt 179 lb 12 oz (81.534 kg)  BMI 25.79 kg/m2  SpO2 97%  BP Readings from Last 3 Encounters:  09/09/14 130/96  06/12/14 140/95  03/13/14 126/84    Wt Readings from Last 3 Encounters:  09/09/14 179 lb 12 oz (81.534 kg)  06/12/14 186 lb (84.369 kg)  03/13/14 187 lb 3.2 oz (84.913 kg)    Physical Exam  Constitutional: He is oriented to person, place, and time. He appears well-developed and well-nourished. No distress.  HENT:  Head: Normocephalic and atraumatic.  Mouth/Throat: Oropharynx is clear and moist. No oropharyngeal exudate.  Eyes: Conjunctivae are normal. Right eye exhibits no discharge. Left eye exhibits no discharge. No scleral icterus.  Neck: Normal range of motion. Neck supple. No JVD present. No tracheal deviation present. No thyroid mass and no thyromegaly present.    Cardiovascular: Normal rate, regular rhythm, normal heart sounds and intact distal pulses.  Exam reveals no gallop and no friction rub.   No murmur heard. Pulmonary/Chest: Effort normal and breath sounds normal. No stridor.  No respiratory distress. He has no wheezes. He has no rales. He exhibits no tenderness.  Abdominal: Soft. Bowel sounds are normal. He exhibits no distension and no mass. There is no tenderness. There is no rebound and no guarding. Hernia confirmed negative in the right  inguinal area and confirmed negative in the left inguinal area.  Genitourinary: Rectum normal, prostate normal, testes normal and penis normal. Rectal exam shows no external hemorrhoid, no internal hemorrhoid, no fissure, no mass, no tenderness and anal tone normal. Guaiac negative stool. Prostate is not enlarged and not tender. Right testis shows no mass, no swelling and no tenderness. Right testis is descended. Left testis shows no mass, no swelling and no tenderness. Left testis is descended. Circumcised. No penile erythema or penile tenderness. No discharge found.  Musculoskeletal: Normal range of motion. He exhibits no edema or tenderness.  Lymphadenopathy:    He has no cervical adenopathy.       Right: No inguinal adenopathy present.       Left: No inguinal adenopathy present.  Neurological: He is oriented to person, place, and time.  Skin: Skin is warm and dry. No rash noted. He is not diaphoretic. No erythema. No pallor.  Psychiatric: He has a normal mood and affect. His behavior is normal. Judgment and thought content normal.  Vitals reviewed.   Lab Results  Component Value Date   WBC 6.0 09/09/2014   HGB 16.0 09/09/2014   HCT 46.9 09/09/2014   PLT 305.0 09/09/2014   GLUCOSE 93 09/09/2014   CHOL 222* 09/09/2014   TRIG 78.0 09/09/2014   HDL 69.10 09/09/2014   LDLDIRECT 147.7 12/03/2007   LDLCALC 137* 09/09/2014   ALT 16 09/09/2014   AST 20 09/09/2014   NA 141 09/09/2014   K 4.4 09/09/2014   CL 105 09/09/2014   CREATININE 0.96 09/09/2014   BUN 33* 09/09/2014   CO2 29 09/09/2014   TSH 2.95 09/09/2014   PSA 0.64 09/09/2014   INR 1.0 09/01/2008   HGBA1C 5.5 07/30/2013    Dg Esophagus  02/23/2014   CLINICAL DATA:  64 year old male with history of chemo radiation for pharyngeal carcinoma, completed in 2011. Dysphagia, query stricture. Initial encounter.  EXAM: ESOPHOGRAM / BARIUM SWALLOW / BARIUM TABLET STUDY  TECHNIQUE: Combined double contrast and single contrast  examination performed using effervescent crystals, thick barium liquid, and thin barium liquid. The patient was observed with fluoroscopy swallowing a 71mm barium sulphate tablet.  FLUOROSCOPY TIME:  2 min 23 seconds  COMPARISON:  Chest CT 02/13/2014.  Neck CT 05/22/2011.  FINDINGS: A double contrast study was undertaken and the patient tolerated this well and without difficulty.  No obstruction to the forward flow of contrast throughout the esophagus and into the stomach. Normal esophageal course and contour. Normal esophageal mucosal pattern.  A 12.5 mm barium tablet was administered and did briefly pause at the level of the clavicles (series 8), but without an additional swallow it then passed freely to the distal esophagus and into the stomach (series 9 and 10).  Rapid sequence imaging of the cervical esophagus is normal except for some retained contrast in the vallecula and piriform sinuses.  Prone swallows thin demonstrate normal for age esophageal motility. A small sliding-type hiatal hernia became apparent. Occasional tertiary contractions occurred.  No gastroesophageal reflux occurred spontaneously or was elicited.  IMPRESSION: 1. Negative for esophageal stricture.  Mild presbyesophagus. 2. Mildly delayed oral phase of swallowing as well as small volume retained contrast in the vallecula and piriform  sinuses noted with swallows, probably sequelae of radiation therapy.   Electronically Signed   By: Lars Pinks M.D.   On: 02/23/2014 10:35  Lab Results  Component Value Date   WBC 6.0 09/09/2014   HGB 16.0 09/09/2014   HCT 46.9 09/09/2014   PLT 305.0 09/09/2014   GLUCOSE 93 09/09/2014   CHOL 222* 09/09/2014   TRIG 78.0 09/09/2014   HDL 69.10 09/09/2014   LDLDIRECT 147.7 12/03/2007   LDLCALC 137* 09/09/2014   ALT 16 09/09/2014   AST 20 09/09/2014   NA 141 09/09/2014   K 4.4 09/09/2014   CL 105 09/09/2014   CREATININE 0.96 09/09/2014   BUN 33* 09/09/2014   CO2 29 09/09/2014   TSH 2.95  09/09/2014   PSA 0.64 09/09/2014   INR 1.0 09/01/2008   HGBA1C 5.5 07/30/2013    Assessment & Plan:   Brandon [name] was seen today for hypertension, hypothyroidism and annual exam.  Diagnoses and all orders for this visit:  Insomnia Orders: -     Discontinue: zolpidem (AMBIEN) 10 MG tablet; Take 1 tablet (10 mg total) by mouth at bedtime as needed. -     zolpidem (AMBIEN) 10 MG tablet; Take 1 tablet (10 mg total) by mouth at bedtime as needed.  Routine general medical examination at a health care facility - exam done, vaccines were reviewed, he was referred for a colonoscopy, labs ordered Orders: -     Lipid panel; Future -     Comprehensive metabolic panel; Future -     CBC with Differential/Platelet; Future -     PSA; Future -     TSH; Future -     Urinalysis, Routine w reflex microscopic (not at [facility]); Future  Screen for colon cancer Orders: -     Ambulatory referral to Gastroenterology  Thoracic aortic aneurysm - will control the BP and start a statin Orders: -     carvedilol (COREG) 6.25 MG tablet; Take 1 tablet (6.25 mg total) by mouth 2 (two) times daily with a meal.  Essential hypertension, benign - his BP is not well controlled, will increase the coreg strength and I have asked him to stop taking motrin Orders: -     carvedilol (COREG) 6.25 MG tablet; Take 1 tablet (6.25 mg total) by mouth 2 (two) times daily with a meal.   I have discontinued Mr. Millikin's ibuprofen and carvedilol. I am also having him start on carvedilol and atorvastatin. Additionally, I am having him maintain his gabapentin, aspirin, omeprazole, zolpidem, and levothyroxine.  Meds ordered this encounter  Medications  . DISCONTD: zolpidem (AMBIEN) 10 MG tablet    Sig: Take 1 tablet (10 mg total) by mouth at bedtime as needed.    Dispense:  90 tablet    Refill:  1  . zolpidem (AMBIEN) 10 MG tablet    Sig: Take 1 tablet (10 mg total) by mouth at bedtime as needed.    Dispense:  90 tablet    Refill:  1   . carvedilol (COREG) 6.25 MG tablet    Sig: Take 1 tablet (6.25 mg total) by mouth 2 (two) times daily with a meal.    Dispense:  180 tablet    Refill:  3  . levothyroxine (SYNTHROID, LEVOTHROID) 50 MCG tablet    Sig: Take 1 tablet (50 mcg total) by mouth daily before breakfast.    Dispense:  90 tablet    Refill:  2  . atorvastatin (LIPITOR) 40 MG tablet  Sig: Take 1 tablet (40 mg total) by mouth daily.    Dispense:  90 tablet    Refill:  3     Follow-up: Return in about 3 months (around 12/10/2014).  Scarlette Calico, MD

## 2014-09-09 NOTE — Patient Instructions (Signed)

## 2014-09-09 NOTE — Progress Notes (Signed)
Pre visit review using our clinic review tool, if applicable. No additional management support is needed unless otherwise documented below in the visit note. 

## 2014-09-10 ENCOUNTER — Encounter: Payer: Self-pay | Admitting: Internal Medicine

## 2014-09-10 MED ORDER — ATORVASTATIN CALCIUM 40 MG PO TABS: 40.0000 mg | ORAL_TABLET | Freq: Every day | ORAL | Status: DC

## 2014-09-10 NOTE — Telephone Encounter (Signed)
Spoke with patient's wife and her husband would like to switch from Dr. Olevia Perches to Dr. Hilarie Fredrickson because Dr. Hilarie Fredrickson sees her. Is this ok?

## 2014-09-10 NOTE — Telephone Encounter (Signed)
ok 

## 2014-09-10 NOTE — Telephone Encounter (Signed)
OK to switch 

## 2014-09-10 NOTE — Telephone Encounter (Signed)
Dr. Pyrtle will you accept this patient? °

## 2014-09-10 NOTE — Assessment & Plan Note (Signed)
Will start a statin for risk reduction

## 2014-09-10 NOTE — Telephone Encounter (Signed)
LM on Vmail to CB to sch'd an appt °

## 2014-09-10 NOTE — Telephone Encounter (Signed)
[place] for patient to switch per both providers.

## 2014-09-24 ENCOUNTER — Ambulatory Visit (INDEPENDENT_AMBULATORY_CARE_PROVIDER_SITE_OTHER): Payer: 59 | Admitting: Internal Medicine

## 2014-09-24 ENCOUNTER — Other Ambulatory Visit: Payer: Self-pay | Admitting: Emergency Medicine

## 2014-09-24 ENCOUNTER — Encounter: Payer: Self-pay | Admitting: Internal Medicine

## 2014-09-24 VITALS — BP 122/84 | HR 71 | Temp 97.9°F | Resp 16 | Wt 179.0 lb

## 2014-09-24 DIAGNOSIS — H9192 Unspecified hearing loss, left ear: Secondary | ICD-10-CM | POA: Diagnosis not present

## 2014-09-24 DIAGNOSIS — J209 Acute bronchitis, unspecified: Secondary | ICD-10-CM

## 2014-09-24 DIAGNOSIS — H9202 Otalgia, left ear: Secondary | ICD-10-CM

## 2014-09-24 MED ORDER — MOMETASONE FUROATE 0.1 % EX OINT: TOPICAL_OINTMENT | Freq: Two times a day (BID) | CUTANEOUS | Status: DC

## 2014-09-24 MED ORDER — AZITHROMYCIN 250 MG PO TABS: ORAL_TABLET | ORAL | Status: DC

## 2014-09-24 MED ORDER — NEOMYCIN-POLYMYXIN-HC 3.5-10000-1 OT SUSP
3.0000 [drp] | Freq: Four times a day (QID) | OTIC | Status: DC
Start: 2014-09-24 — End: 2014-10-28

## 2014-09-24 NOTE — Progress Notes (Signed)
Pre visit review using our clinic review tool, if applicable. No additional management support is needed unless otherwise documented below in the visit note. 

## 2014-09-24 NOTE — Progress Notes (Signed)
   Subjective:    Patient ID: Brandon [name], male    DOB: [birth year], 64 y.o.   MRN: [id number]  HPI  He has had soreness in the left ear for 4 days after getting salt water in the ear.His wife used a Q tip to try to dry the ear but symptoms progressed. His wife place 2 drops of steroid drop in the ear which provided moderate benefit.  He's also developed a productive cough with green-brown mucus intermittently on 3-4 occasions in the past 4 days. The cough has awakened him.  He is a history of squamous cell carcinoma, stage III of the left tonsil, HPV positive.    Review of Systems  Frontal headache, facial pain , nasal purulence, dental pain, sore throat , otic pain or otic discharge denied. No fever , chills or sweats.      Objective:   Physical Exam  Pertinent or positive findings include : Bilateral ptosis.He is markedly hoarse. Cerumen impaction suggestion on the left. Induration from radiation of the left neck. Fullness in the left posterior pharynx with asymmetry of the posterior pharynx.  General appearance:Adequately nourished; no acute distress or increased work of breathing is present.    Lymphatic: No  lymphadenopathy about the head, neck, or axilla .  Eyes: No conjunctival inflammation or lid edema is present. There is no scleral icterus.  Ears:  External ear exam shows no significant lesions or deformities.  R TM normal.  Nose:  External nasal examination shows no deformity or inflammation. Nasal mucosa are dry without lesions or exudates No septal dislocation or deviation.No obstruction to airflow.   Oral exam: Dental hygiene is good; lips and gums are healthy appearing.There is no oropharyngeal  exudate .  Neck:  No deformities, thyromegaly, masses, or tenderness noted.    Heart:  Normal rate and regular rhythm. S1 and S2 normal without gallop, murmur, click, rub or other extra sounds.   Lungs:Chest clear to auscultation; no wheezes, rhonchi,rales ,or rubs  present.  Extremities:  No cyanosis, edema, or clubbing  noted    Skin: Warm & dry w/o tenting or jaundice. No significant lesions or rash.         Assessment & Plan:  #1cerumen impaction #2 acute bronchitis See orders  After soaking and gavage a large cerumen impaction was removed. Hearing was intact. There was erythema of the tympanic membrane,most likely related to the gavage.  He was given prescriptions for Cortisporin otic and azithromycin should he have signs of infection

## 2014-09-24 NOTE — Patient Instructions (Addendum)
Please do not use Q-tips as we discussed. Should wax build up occur, please put 2-3 drops of mineral oil in the ear at night and cover the canal with a  cotton ball.In the morning fill the canal with hydrogen peroxide & leave  for 10-15 minutes.Following this shower and use the thinnest washrag available to wick out the wax.   Fill the  prescription for antibiotics if fever, discolored nasal or chest secretions ,or ear discharge appear in the next 48-72 hours.

## 2014-10-28 ENCOUNTER — Ambulatory Visit (AMBULATORY_SURGERY_CENTER): Payer: Self-pay | Admitting: *Deleted

## 2014-10-28 VITALS — Ht 70.0 in | Wt 180.4 lb

## 2014-10-28 DIAGNOSIS — Z1211 Encounter for screening for malignant neoplasm of colon: Secondary | ICD-10-CM

## 2014-10-28 MED ORDER — NA SULFATE-K SULFATE-MG SULF 17.5-3.13-1.6 GM/177ML PO SOLN: 1.0000 | Freq: Once | ORAL | Status: DC

## 2014-10-28 NOTE — Progress Notes (Signed)
No egg or soy allergy No issues with past sedation No home 02 use No diet pills emmi video declined

## 2014-11-11 ENCOUNTER — Encounter: Payer: Self-pay | Admitting: Internal Medicine

## 2014-11-11 ENCOUNTER — Ambulatory Visit (AMBULATORY_SURGERY_CENTER): Payer: 59 | Admitting: Internal Medicine

## 2014-11-11 VITALS — BP 126/73 | HR 52 | Temp 95.9°F | Resp 14 | Ht 70.0 in | Wt 180.0 lb

## 2014-11-11 DIAGNOSIS — D125 Benign neoplasm of sigmoid colon: Secondary | ICD-10-CM | POA: Diagnosis not present

## 2014-11-11 DIAGNOSIS — K635 Polyp of colon: Secondary | ICD-10-CM | POA: Diagnosis not present

## 2014-11-11 DIAGNOSIS — Z1211 Encounter for screening for malignant neoplasm of colon: Secondary | ICD-10-CM | POA: Diagnosis not present

## 2014-11-11 LAB — HM COLONOSCOPY

## 2014-11-11 MED ORDER — SODIUM CHLORIDE 0.9 % IV SOLN: 500.0000 mL | INTRAVENOUS | Status: DC

## 2014-11-11 NOTE — Progress Notes (Signed)
Transferred to recovery room. A/O x3, pleased with MAC.  VSS.  Report to June, RN.

## 2014-11-11 NOTE — Progress Notes (Signed)
Called to room to assist during endoscopic procedure.  Patient ID and intended procedure confirmed with present staff. Received instructions for my participation in the procedure from the performing physician.  

## 2014-11-11 NOTE — Patient Instructions (Signed)
YOU HAD AN ENDOSCOPIC PROCEDURE TODAY AT [facility]:   Refer to the procedure report that was given to you for any specific questions about what was found during the examination.  If the procedure report does not answer your questions, please call your gastroenterologist to clarify.  If you requested that your care partner not be given the details of your procedure findings, then the procedure report has been included in a sealed envelope for you to review at your convenience later.  YOU SHOULD EXPECT: Some feelings of bloating in the abdomen. Passage of more gas than usual.  Walking can help get rid of the air that was put into your GI tract during the procedure and reduce the bloating. If you had a lower endoscopy (such as a colonoscopy or flexible sigmoidoscopy) you may notice spotting of blood in your stool or on the toilet paper. If you underwent a bowel prep for your procedure, you may not have a normal bowel movement for a few days.  Please Note:  You might notice some irritation and congestion in your nose or some drainage.  This is from the oxygen used during your procedure.  There is no need for concern and it should clear up in a day or so.  SYMPTOMS TO REPORT IMMEDIATELY:   Following lower endoscopy (colonoscopy or flexible sigmoidoscopy):  Excessive amounts of blood in the stool  Significant tenderness or worsening of abdominal pains  Swelling of the abdomen that is new, acute  Fever of 100F or higher   For urgent or emergent issues, a gastroenterologist can be reached at any hour by calling [phone].   DIET: Your first meal following the procedure should be a small meal and then it is ok to progress to your normal diet. Heavy or fried foods are harder to digest and may make you feel nauseous or bloated.  Likewise, meals heavy in dairy and vegetables can increase bloating.  Drink plenty of fluids but you should avoid alcoholic beverages for 24  hours.  ACTIVITY:  You should plan to take it easy for the rest of today and you should NOT DRIVE or use heavy machinery until tomorrow (because of the sedation medicines used during the test).    FOLLOW UP: Our staff will call the number listed on your records the next business day following your procedure to check on you and address any questions or concerns that you may have regarding the information given to you following your procedure. If we do not reach you, we will leave a message.  However, if you are feeling well and you are not experiencing any problems, there is no need to return our call.  We will assume that you have returned to your regular daily activities without incident.  If any biopsies were taken you will be contacted by phone or by letter within the next 1-3 weeks.  Please call us at [phone] if you have not heard about the biopsies in 3 weeks.    SIGNATURES/CONFIDENTIALITY: You and/or your care partner have signed paperwork which will be entered into your electronic medical record.  These signatures attest to the fact that that the information above on your After Visit Summary has been reviewed and is understood.  Full responsibility of the confidentiality of this discharge information lies with you and/or your care-partner.  Polyp, diverticulosis, and high fiber diet information given.

## 2014-11-11 NOTE — Op Note (Signed)
[facility]  [address] [place], [id number]   COLONOSCOPY PROCEDURE REPORT  PATIENT: Brandon [name], Brandon [name]  MR#: [id number] BIRTHDATE: Jul 31, 1950 , 8  yrs. old GENDER: male ENDOSCOPIST: Jerene Bears, MD REFERRED XB:[id number] Evalina Field, M.D. PROCEDURE DATE:  11/11/2014 PROCEDURE:   Colonoscopy, screening and Colonoscopy with snare polypectomy First Screening Colonoscopy - Avg.  risk and is 50 yrs.  old or older - No.  Prior Negative Screening - Now for repeat screening. 10 or more years since last screening  History of Adenoma - Now for follow-up colonoscopy & has been > or = to 3 yrs.  N/A  Polyps removed today? Yes ASA CLASS:   Class II INDICATIONS:Screening for colonic neoplasia and average risk, last colonoscopy 10 yrs ago (hyperplastic polyp). MEDICATIONS: Monitored anesthesia care and Propofol 300 mg IV  DESCRIPTION OF PROCEDURE:   After the risks benefits and alternatives of the procedure were thoroughly explained, informed consent was obtained.  The digital rectal exam revealed no rectal mass.   The [id number]  endoscope was introduced through the anus and advanced to the cecum, which was identified by both the appendix and ileocecal valve. No adverse events experienced. The quality of the prep was good.  (Suprep was used)  The instrument was then slowly withdrawn as the colon was fully examined. Estimated blood loss is zero unless otherwise noted in this procedure report.  COLON FINDINGS: A sessile polyp measuring 6 mm in size was found in the sigmoid colon.  A polypectomy was performed with a cold snare. The resection was complete, the polyp tissue was completely retrieved and sent to histology.   There was moderate diverticulosis noted in the ascending colon, descending colon, and sigmoid colon.  Retroflexion was not performed due to a narrow rectal vault. The time to cecum = 4.3 Withdrawal time = 10.2   The scope was withdrawn and the procedure  completed. COMPLICATIONS: There were no immediate complications.  ENDOSCOPIC IMPRESSION: 1.   Sessile polyp was found in the sigmoid colon; polypectomy was performed with a cold snare 2.   Moderate diverticulosis was noted in the ascending colon, descending colon, and sigmoid colon  RECOMMENDATIONS: 1.  Await pathology results 2.  High fiber diet 3.  If the polyp removed today is proven to be an adenomatous (pre-cancerous) polyp, you will need a repeat colonoscopy in 5 years.  Otherwise you should continue to follow colorectal cancer screening guidelines for "routine risk" patients with colonoscopy in 10 years.  You will receive a letter within 1-2 weeks with the results of your biopsy as well as final recommendations.  Please call my office if you have not received a letter after 3 weeks.  eSigned:  Jerene Bears, MD 11/11/2014 8:52 AM   cc: [name], MD and The Patient

## 2014-11-12 ENCOUNTER — Telehealth: Payer: Self-pay | Admitting: Emergency Medicine

## 2014-11-12 NOTE — Telephone Encounter (Signed)
  Follow up Call-  Call back number 11/11/2014  Post procedure Call Back phone  # [phone]  Permission to leave phone message Yes     Patient questions:  Do you have a fever, pain , or abdominal swelling? No. Pain Score  0 *  Have you tolerated food without any problems? Yes.    Have you been able to return to your normal activities? Yes.    Do you have any questions about your discharge instructions: Diet   No. Medications  No. Follow up visit  No.  Do you have questions or concerns about your Care? No.  Actions: * If pain score is 4 or above: No action needed, pain <4.

## 2014-11-18 ENCOUNTER — Encounter: Payer: Self-pay | Admitting: Internal Medicine

## 2014-12-11 ENCOUNTER — Ambulatory Visit: Payer: 59 | Admitting: Hematology & Oncology

## 2014-12-11 ENCOUNTER — Other Ambulatory Visit: Payer: 59

## 2014-12-28 ENCOUNTER — Other Ambulatory Visit: Payer: Self-pay | Admitting: Hematology & Oncology

## 2014-12-30 ENCOUNTER — Ambulatory Visit (HOSPITAL_BASED_OUTPATIENT_CLINIC_OR_DEPARTMENT_OTHER): Payer: 59 | Admitting: Hematology & Oncology

## 2014-12-30 ENCOUNTER — Other Ambulatory Visit (HOSPITAL_BASED_OUTPATIENT_CLINIC_OR_DEPARTMENT_OTHER): Payer: 59

## 2014-12-30 ENCOUNTER — Encounter: Payer: Self-pay | Admitting: Hematology & Oncology

## 2014-12-30 ENCOUNTER — Ambulatory Visit (HOSPITAL_BASED_OUTPATIENT_CLINIC_OR_DEPARTMENT_OTHER): Payer: 59

## 2014-12-30 VITALS — BP 151/96 | HR 62 | Temp 97.8°F | Resp 16 | Ht 70.0 in | Wt 179.0 lb

## 2014-12-30 DIAGNOSIS — Z85819 Personal history of malignant neoplasm of unspecified site of lip, oral cavity, and pharynx: Secondary | ICD-10-CM

## 2014-12-30 DIAGNOSIS — E038 Other specified hypothyroidism: Secondary | ICD-10-CM | POA: Diagnosis not present

## 2014-12-30 DIAGNOSIS — I10 Essential (primary) hypertension: Secondary | ICD-10-CM

## 2014-12-30 DIAGNOSIS — Z23 Encounter for immunization: Secondary | ICD-10-CM

## 2014-12-30 DIAGNOSIS — E785 Hyperlipidemia, unspecified: Secondary | ICD-10-CM

## 2014-12-30 LAB — COMPREHENSIVE METABOLIC PANEL (CC13)
ALT: 22 U/L (ref 0–55)
AST: 33 U/L (ref 5–34)
Albumin: 3.8 g/dL (ref 3.5–5.0)
Alkaline Phosphatase: 68 U/L (ref 40–150)
Anion Gap: 11 meq/L (ref 3–11)
BUN: 23.2 mg/dL (ref 7.0–26.0)
CO2: 28 meq/L (ref 22–29)
Calcium: 9.2 mg/dL (ref 8.4–10.4)
Chloride: 106 meq/L (ref 98–109)
Creatinine: 0.8 mg/dL (ref 0.7–1.3)
EGFR: >90 ml/min/1.73 m2
Glucose: 98 mg/dL (ref 70–140)
Potassium: 3.5 meq/L (ref 3.5–5.1)
Sodium: 145 meq/L (ref 136–145)
Total Bilirubin: 0.71 mg/dL (ref 0.20–1.20)
Total Protein: 6.8 g/dL (ref 6.4–8.3)

## 2014-12-30 LAB — CBC WITH DIFFERENTIAL (CANCER CENTER ONLY)
BASO#: 0 10*3/uL (ref 0.0–0.2)
BASO%: 0.7 % (ref 0.0–2.0)
EOS%: 2.8 % (ref 0.0–7.0)
Eosinophils Absolute: 0.2 10*3/uL (ref 0.0–0.5)
HEMATOCRIT: 41.7 % (ref 38.7–49.9)
HGB: 14.7 g/dL (ref 13.0–17.1)
LYMPH#: 1.2 10*3/uL (ref 0.9–3.3)
LYMPH%: 21.7 % (ref 14.0–48.0)
MCH: 33 pg (ref 28.0–33.4)
MCHC: 35.3 g/dL (ref 32.0–35.9)
MCV: 94 fL (ref 82–98)
MONO#: 0.5 10*3/uL (ref 0.1–0.9)
MONO%: 8.4 % (ref 0.0–13.0)
NEUT#: 3.6 10*3/uL (ref 1.5–6.5)
NEUT%: 66.4 % (ref 40.0–80.0)
PLATELETS: 263 10*3/uL (ref 145–400)
RBC: 4.46 10*6/uL (ref 4.20–5.70)
RDW: 14.7 % (ref 11.1–15.7)
WBC: 5.5 10*3/uL (ref 4.0–10.0)

## 2014-12-30 MED ORDER — LEVOTHYROXINE SODIUM 50 MCG PO TABS: 50.0000 ug | ORAL_TABLET | Freq: Every day | ORAL | Status: DC

## 2014-12-30 MED ORDER — INFLUENZA VAC SPLIT QUAD 0.5 ML IM SUSY
0.5000 mL | PREFILLED_SYRINGE | Freq: Once | INTRAMUSCULAR | Status: AC
  Administered 2014-12-30: 0.5 mL via INTRAMUSCULAR
  Filled 2014-12-30: qty 0.5

## 2014-12-30 NOTE — Patient Instructions (Signed)

## 2014-12-30 NOTE — Progress Notes (Signed)
Hematology and Oncology Follow Up Visit  Brandon [name] [id number] [birth year] 64 y.o. 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  [name] is back for follow-up. He is doing better. He is doing okay. His only complaint has been an issue with an ulcer on the left side of his tongue. He says that he has seen Brandon [name] in the past. A biopsy was done. I cannot find any report from the biopsy. I told him that Dr. base probably could see him again for an evaluation. History could be from his radiation.  I really believe that he is cured from his initial squamous cell cancer. The fact that he was HPV positive was a important factor in his outcome.  He's had no problems with bowels or bladder. He had a colonoscopy of the back in May. He had a biopsy done which was negative.  He's had no cough. His been no chest wall pain. He's had no shortness of breath.  He's had no leg swelling. He's had no rashes.  He's had no fever.  Overall, his performance status is ECOG 0.  Medications:  Current outpatient prescriptions:  .  aspirin 81 MG tablet, Take 81 mg by mouth daily., Disp: , Rfl:  .  atorvastatin (LIPITOR) 40 MG tablet, Take 1 tablet (40 mg total) by mouth daily., Disp: 90 tablet, Rfl: 3 .  carvedilol (COREG) 6.25 MG tablet, Take 1 tablet (6.25 mg total) by mouth 2 (two) times daily with a meal., Disp: 180 tablet, Rfl: 3 .  gabapentin (NEURONTIN) 100 MG capsule, Take 3 capsules (300 mg total) by mouth at bedtime., Disp: 270 capsule, Rfl: 3 .  levothyroxine (SYNTHROID, LEVOTHROID) 50 MCG tablet, Take 1 tablet (50 mcg total) by mouth daily before breakfast., Disp: 90 tablet, Rfl: 6 .  omeprazole (PRILOSEC) 40 MG capsule, TAKE 1 CAPSULE DAILY, Disp: 90 capsule, Rfl: 2 .  zolpidem (AMBIEN) 10 MG tablet, Take 1 tablet (10 mg total) by mouth at bedtime as needed., Disp: 90  tablet, Rfl: 1 .  ibuprofen (ADVIL,MOTRIN) 800 MG tablet, Take 800 mg by mouth every 8 (eight) hours as needed., Disp: , Rfl:   Allergies:  Allergies  Allergen Reactions  . Benicar [Olmesartan]     fatigue    Past Medical History, Surgical history, Social history, and Family History were reviewed and updated.  Review of Systems: As above  Physical Exam:  height is 5\' 10"  (1.778 m) and weight is 179 lb (81.194 kg). His oral temperature is 97.8 F (36.6 C). His blood pressure is 151/96 and his pulse is 62. His respiration is 16.   Well-developed and well-nourished [name] gentleman. He is alert and oriented x3. Head and neck exam shows some thickness of the skin on his neck. He has a lot of firmness of the neck. . Oral mucosa is moist.. He has no mucositis. On the left side of his tongue, near the posterior portion, there is some firmness and an area of induration. There is no petechia or bleeding in the oral cavity. Thyroid is nonpalpable. Lungs are clear bilaterally. Cardiac exam regular rate and rhythm with no murmurs rubs or bruits. Abdomen is soft. Has good bowel sounds. There is no fluid wave. There is no palpable liver or spleen tip.he does have a small hernia at the site of his feeding tube in the  left upper abdomen. Back exam shows no tenderness over the spine ribs or hips. Extremities shows no clubbing cyanosis or edema. He has good range of motion of his joints. Skin exam shows some dryness of his skin. He has some scattered ecchymoses.. Neurological exam is nonfocal.  Lab Results  Component Value Date   WBC 5.5 12/30/2014   HGB 14.7 12/30/2014   HCT 41.7 12/30/2014   MCV 94 12/30/2014   PLT 263 12/30/2014     Chemistry      Component Value Date/Time   NA 141 09/09/2014 1422   NA 145 06/12/2014 0903   NA 143 11/13/2012 0857   K 4.4 09/09/2014 1422   K 4.0 06/12/2014 0903   K 3.5 11/13/2012 0857   CL 105 09/09/2014 1422   CL 99 06/12/2014 0903   CO2 29 09/09/2014 1422    CO2 31 06/12/2014 0903   CO2 24 11/13/2012 0857   BUN 33* 09/09/2014 1422   BUN 26* 06/12/2014 0903   BUN 27.7* 11/13/2012 0857   CREATININE 0.96 09/09/2014 1422   CREATININE 1.0 06/12/2014 0903   CREATININE 0.9 11/13/2012 0857      Component Value Date/Time   CALCIUM 9.7 09/09/2014 1422   CALCIUM 9.4 06/12/2014 0903   CALCIUM 9.8 11/13/2012 0857   ALKPHOS 62 09/09/2014 1422   ALKPHOS 66 06/12/2014 0903   ALKPHOS 63 11/13/2012 0857   AST 20 09/09/2014 1422   AST 23 06/12/2014 0903   AST 21 11/13/2012 0857   ALT 16 09/09/2014 1422   ALT 16 06/12/2014 0903   ALT 16 11/13/2012 0857   BILITOT 0.4 09/09/2014 1422   BILITOT 0.60 06/12/2014 0903   BILITOT 0.59 11/13/2012 0857        Impression and Plan: Brandon [name] is a 64 year old gentleman with a history of stage III squamous cell carcinoma of the left tonsil. He underwent curative therapy with radiation and chemotherapy. He completed treatments back in March 2011. I think the chance of recurrence now is less than 5%. I actually think that he is cured.  I told him to go back and see Brandon [name] of ENT for this issue with the left side of his tongue. He may need to have another biopsy.  I don't see that would have to do any scans on him. I try to reassure he and his wife that there is no indication that he needs any scans. His labs looked okay.  At this point, I think we'll let him go from the practice. He's now been 5 years out. He died told him that if he ever needed to come back to see [place] we can certainly have him come back.  Brandon Napoleon, MD 9/21/20161:24 PM

## 2014-12-31 ENCOUNTER — Telehealth: Payer: Self-pay | Admitting: *Deleted

## 2014-12-31 LAB — TSH CHCC: TSH: 3.266 m(IU)/L (ref 0.320–4.118)

## 2014-12-31 LAB — VITAMIN D 25 HYDROXY (VIT D DEFICIENCY, FRACTURES): Vit D, 25-Hydroxy: 42 ng/mL (ref 30–100)

## 2014-12-31 NOTE — Telephone Encounter (Addendum)
Message left on home voice mail  ----- Message from Volanda Napoleon, MD sent at 12/31/2014 11:47 AM EDT ----- Call and tell him that the thyroid level was normal. Thank you

## 2015-02-16 ENCOUNTER — Encounter: Payer: Self-pay | Admitting: Internal Medicine

## 2015-03-10 ENCOUNTER — Ambulatory Visit (INDEPENDENT_AMBULATORY_CARE_PROVIDER_SITE_OTHER)
Admission: RE | Admit: 2015-03-10 | Discharge: 2015-03-10 | Disposition: A | Discharge status: Home / Self Care | Payer: 59 | Source: Ambulatory Visit | Attending: Internal Medicine | Admitting: Internal Medicine

## 2015-03-10 DIAGNOSIS — I712 Thoracic aortic aneurysm, without rupture, unspecified: Secondary | ICD-10-CM

## 2015-03-10 MED ORDER — IOHEXOL 350 MG/ML SOLN
100.0000 mL | Freq: Once | INTRAVENOUS | Status: AC | PRN
  Administered 2015-03-10: 100 mL via INTRAVENOUS

## 2015-03-10 MED ORDER — IOHEXOL 350 MG/ML SOLN: 100.0000 mL | Freq: Once | INTRAVENOUS | Status: DC | PRN

## 2015-03-12 ENCOUNTER — Encounter: Payer: Self-pay | Admitting: Internal Medicine

## 2015-03-12 ENCOUNTER — Ambulatory Visit (INDEPENDENT_AMBULATORY_CARE_PROVIDER_SITE_OTHER): Payer: 59 | Admitting: Internal Medicine

## 2015-03-12 VITALS — BP 180/124 | HR 57 | Ht 70.0 in | Wt 183.0 lb

## 2015-03-12 DIAGNOSIS — I712 Thoracic aortic aneurysm, without rupture, unspecified: Secondary | ICD-10-CM

## 2015-03-12 DIAGNOSIS — I10 Essential (primary) hypertension: Secondary | ICD-10-CM

## 2015-03-12 DIAGNOSIS — G4700 Insomnia, unspecified: Secondary | ICD-10-CM

## 2015-03-12 DIAGNOSIS — E785 Hyperlipidemia, unspecified: Secondary | ICD-10-CM

## 2015-03-12 MED ORDER — AMLODIPINE BESYLATE 10 MG PO TABS: 10.0000 mg | ORAL_TABLET | Freq: Every day | ORAL | Status: DC

## 2015-03-12 NOTE — Progress Notes (Signed)
OFFICE NOTE  Chief Complaint:  Follow-up aortic aneurysm  Primary Care Physician: Scarlette Calico, MD  HPI:  Brandon [name] is a pleasant 64 year old male who goes by "Brandon [name]". He has a history of throat cancer and received radiation and chemotherapy. Subsequently he is undergone screening CT scans and was noted to have some pulmonary nodules. There is a history of cigar smoking in the past. Recently his CT scan indicated a small ascending aortic aneurysm, measuring 4.1 cm. This was not previously reported. He is completely asymptomatic, denies any chest pain or shortness of breath. There is no history of aneurysm to his knowledge in the family. He does have borderline hypertension and was never treated with medications until recently. Based on the borderline elevated blood pressures and hypertension he was started on Bystolic, which she seems to be tolerating well. Blood pressure today is 126/84 and heart rate is well-controlled at 54.  Mr. Brandon [name] returns today for follow-up of his aortic aneurysm. He recently underwent a repeat CT scan which shows a stable 4.1 cm fusiform ascending aortic aneurysm. He is asymptomatic with this. He denies any chest pain or worsening shortness of breath. He was previously on by systolic and had good blood pressure control, however felt that he was having side effects from the medicine and was ultimately switched to carvedilol. That was recently increased to 6.25 mg twice a day. Heart rate is now in the upper 50s however blood pressure the office today was 180/124. He did take his medicine this morning. I rechecked that and still was elevated at 178/120. He will need additional blood pressure control. Currently denies any headache, shortness of breath, chest pain, blurred vision or change in urine output.  PMHx:  Past Medical History  Diagnosis Date  . Head and neck cancer     head and neck ca dx 03/2009  . oropharyngeal ca dx'd 03/2009    chemo/xrt comp 06/2009  .  Aortic aneurysm (HCC)     small and watching  . Allergy     mild  . GERD (gastroesophageal reflux disease)   . Hyperlipidemia   . Hypertension   . Thyroid disease     Past Surgical History  Procedure Laterality Date  . Hip replacemnt left Left   . Stmach surgery      repair peg tube site  . Left eye    . Hernia repair      inguinal    FAMHx:  Family History  Problem Relation Age of Onset  . Cancer Mother     lymphoma  . Colon cancer Mother   . Cancer Father     lung (smoker)  . Hyperlipidemia Father   . Hypertension Father   . Diabetes Father   . Esophageal cancer Neg Hx   . Rectal cancer Neg Hx   . Stomach cancer Neg Hx     SOCHx:   reports that he quit smoking about 5 years ago. His smoking use included Cigarettes and Cigars. He started smoking about 41 years ago. He has a 52.5 pack-year smoking history. He has never used smokeless tobacco. He reports that he drinks about 9.0 oz of alcohol per week. He reports that he does not use illicit drugs.  ALLERGIES:  Allergies  Allergen Reactions  . Benicar [Olmesartan]     fatigue    ROS: A comprehensive review of systems was negative.  HOME MEDS: Current Outpatient Prescriptions  Medication Sig Dispense Refill  . aspirin 81 MG tablet  Take 81 mg by mouth daily.    Marland Kitchen atorvastatin (LIPITOR) 40 MG tablet Take 1 tablet (40 mg total) by mouth daily. 90 tablet 3  . carvedilol (COREG) 6.25 MG tablet Take 1 tablet (6.25 mg total) by mouth 2 (two) times daily with a meal. 180 tablet 3  . gabapentin (NEURONTIN) 100 MG capsule Take 3 capsules (300 mg total) by mouth at bedtime. 270 capsule 3  . ibuprofen (ADVIL,MOTRIN) 800 MG tablet Take 800 mg by mouth every 8 (eight) hours as needed.    Marland Kitchen levothyroxine (SYNTHROID, LEVOTHROID) 50 MCG tablet Take 1 tablet (50 mcg total) by mouth daily before breakfast. 90 tablet 6  . omeprazole (PRILOSEC) 40 MG capsule TAKE 1 CAPSULE DAILY 90 capsule 2  . zolpidem (AMBIEN) 10 MG tablet Take  1 tablet (10 mg total) by mouth at bedtime as needed. 90 tablet 1  . amLODipine (NORVASC) 10 MG tablet Take 1 tablet (10 mg total) by mouth daily. 30 tablet 4   No current facility-administered medications for this visit.    LABS/IMAGING: No results found for this or any previous visit (from the past 48 hour(s)). Ct Angio Chest Aorta W/cm &/or Wo/cm  03/10/2015  CLINICAL DATA:  Followup of ascending thoracic aortic aneurysm EXAM: CT ANGIOGRAPHY CHEST WITH CONTRAST TECHNIQUE: Multidetector CT imaging of the chest was performed using the standard protocol during bolus administration of intravenous contrast. Multiplanar CT image reconstructions and MIPs were obtained to evaluate the vascular anatomy. CONTRAST:  190mL OMNIPAQUE IOHEXOL 350 MG/ML SOLN COMPARISON:  CT chest of 02/13/2014 and 11/13/2011 FINDINGS: On this enhanced study, the mid ascending aorta is measured at the same level and in the same and has previously, now measuring 4.0 cm compared to 4.1 cm previously. Recommend annual imaging followup by CTA or MRA. This recommendation follows 2010 ACCF/AHA/AATS/ACR/ASA/SCA/SCAI/SIR/STS/SVM Guidelines for the Diagnosis and Management of Patients with Thoracic Aortic Disease. Circulation. 2010; 121SP:[id number]. The origins of the great vessels appear patent. The remainder of the thoracic aorta is normal in caliber. The heart is mildly enlarged. No pericardial effusion is seen. No mediastinal or hilar adenopathy is seen. The previously measured prevascular lymph node on the left measures 9 mm in diameter, stable compared to the CT from 2013. The pulmonary arteries are not as well opacified but no central abnormality is evident. The upper abdomen is unremarkable. A low-attenuation structure emanating from the upper pole of the left kidney is again noted complex most consistent with cyst on the images obtained. On lung window images, biapical pleural-parenchymal scarring is again noted, possibly slightly  increased. Air bronchograms to course through these areas of apparent scarring in the apices. The remainder of the lungs are well aerated. No parenchymal infiltrate is seen and no effusion is noted. No lung nodule is present. There are degenerative changes throughout the mid to lower thoracic spine. The thyroid gland is unremarkable. Review of the MIP images confirms the above findings. IMPRESSION: 1. Stable fusiform dilatation of the ascending aorta with maximum diameter of 4.0 cm compared to 4.1 cm on the prior CT. Recommend annual imaging followup by CTA or MRA. This recommendation follows 2010 ACCF/AHA/AATS/ACR/ASA/SCA/SCAI/SIR/STS/SVM Guidelines for the Diagnosis and Management of Patients with Thoracic Aortic Disease. Circulation. 2010; 121: [id number]. 2. Stable mediastinal nodes.  No adenopathy is evident. Electronically Signed   By: Ivar Drape M.D.   On: 03/10/2015 11:01    VITALS: BP 180/124 mmHg  Pulse 57  Ht 5\' 10"  (1.778 m)  Wt 183 lb (83.008  kg)  BMI 26.26 kg/m2  EXAM: General appearance: alert and no distress Neck: no carotid bruit and no JVD Lungs: clear to auscultation bilaterally Heart: regular rate and rhythm, S1, S2 normal, no murmur, click, rub or gallop Abdomen: soft, non-tender; bowel sounds normal; no masses,  no organomegaly Extremities: extremities normal, atraumatic, no cyanosis or edema Pulses: 2+ and symmetric Skin: Skin color, texture, turgor normal. No rashes or lesions Neurologic: Grossly normal Psych: Normal  EKG: Sinus bradycardia at 57  ASSESSMENT: 1. Mild ascending aortic aneurysm-4.1 cm 2. History of tobacco abuse-resolved 3. Hypertension-uncontrolled  PLAN: 1.   Mr. Relles has a stable 4.1 cm fusiform ascending aortic aneurysm. He has uncontrolled hypertension. It's not clear whether this is due to the change in his medications or from some other etiology. Blood pressure today is quite high despite taking his medicine this morning. He seems to be  adequately beta blocked. In the past he thought he had side effects related to Benicar. We'll go ahead and try a calcium channel blocker to start. Plan amlodipine 10 mg daily. I will arrange follow-up with Erasmo Downer, our pharmacist and hypertension management specialist in 1-2 weeks for additional titration of his medications. He is instructed to keep a log of blood pressures and bring his blood pressure cuff with him to check its accuracy.  I'll plan to see him back annually or sooner as necessary.  Pixie Casino, MD, [facility] Attending Cardiologist Ruskin C Hilty 03/12/2015, 8:36 AM

## 2015-03-12 NOTE — Patient Instructions (Addendum)
Medication InstructioN  START AMLODIPINE 10 MG ONE A DAY   If you need a refill on your cardiac medications before your next appointment, please call your pharmacy.  Labwork: NONE ORDER TODAY    Testing/Procedures:.NONE ORDER TODAY     Follow-Up:    WITH PHARM D KRISTEN IN 1 TO 2 WEEK  FOR BLOOD PRESSURE MANAGEMENT  Your physician wants you to follow-up in: [place] will receive a reminder letter in the mail two months in advance. If you don't receive a letter, please call our office to schedule the follow-up appointment.     Any Other Special Instructions Will Be Listed Below (If Applicable).  CHECK YOUR BLOOD PRESSURE DAILY  MAKE SURE WHEN YOU COME TO  YOUR VISIT WITH PHARM D YOU BRING YOUR BLOOD PRESSURE CUFF AND MACHINE ALONG WITH YOUR READINGS

## 2015-03-15 ENCOUNTER — Inpatient Hospital Stay: Admission: RE | Admit: 2015-03-15 | Payer: 59 | Source: Ambulatory Visit

## 2015-03-19 MED ORDER — ZOLPIDEM TARTRATE 10 MG PO TABS: 10.0000 mg | ORAL_TABLET | Freq: Every evening | ORAL | Status: DC | PRN

## 2015-03-19 NOTE — Telephone Encounter (Signed)
Requesting to be sent to [facility]. Please advise

## 2015-03-22 ENCOUNTER — Ambulatory Visit: Payer: 59 | Admitting: Internal Medicine

## 2015-03-22 ENCOUNTER — Telehealth: Payer: Self-pay

## 2015-03-22 NOTE — Telephone Encounter (Signed)
Brandon [name] (Key: [id number])

## 2015-03-22 NOTE — Telephone Encounter (Signed)
Approved. Pharmacy notified

## 2015-03-25 ENCOUNTER — Ambulatory Visit (INDEPENDENT_AMBULATORY_CARE_PROVIDER_SITE_OTHER): Payer: 59 | Admitting: Pharmacist Clinician (PhC)/ Clinical Pharmacy Specialist

## 2015-03-25 ENCOUNTER — Encounter: Payer: Self-pay | Admitting: Pharmacist Clinician (PhC)/ Clinical Pharmacy Specialist

## 2015-03-25 VITALS — BP 138/88 | HR 56 | Ht 70.0 in | Wt 182.9 lb

## 2015-03-25 DIAGNOSIS — I10 Essential (primary) hypertension: Secondary | ICD-10-CM | POA: Diagnosis not present

## 2015-03-25 NOTE — Patient Instructions (Signed)
  Your blood pressure today is 138/88  (goal is <140/90)  Check your blood pressure at home several times each week and keep record of the readings.  Take your BP meds as follows: continue all current medications;  Try to cut ibuprofen dose down as able  Bring all of your meds, your BP cuff and your record of home blood pressures to your next appointment.  Exercise as you're able, try to walk approximately 30 minutes per day.  Keep salt intake to a minimum, especially watch canned and prepared boxed foods.  Eat more fresh fruits and vegetables and fewer canned items.  Avoid eating in fast food restaurants.    HOW TO TAKE YOUR BLOOD PRESSURE: . Rest 5 minutes before taking your blood pressure. .  Don't smoke or drink caffeinated beverages for at least 30 minutes before. . Take your blood pressure before (not after) you eat. . Sit comfortably with your back supported and both feet on the floor (don't cross your legs). . Elevate your arm to heart level on a table or a desk. . Use the proper sized cuff. It should fit smoothly and snugly around your bare upper arm. There should be enough room to slip a fingertip under the cuff. The bottom edge of the cuff should be 1 inch above the crease of the elbow. . Ideally, take 3 measurements at one sitting and record the average.

## 2015-03-25 NOTE — Assessment & Plan Note (Addendum)
Today his BP is just at goal at 138/88.  Because of the aortic aneurysm and despite his age >83, he is a healthy 85 and I would recommend to keep his goal at <140/90. I have suggested that he start weaning down on the ibuprofen and see if he can get by with lower doses, as it can increase hypertension.  Advised that continued use can increase risk of GI irritation, as can alcohol.  With his numbers looking good, I will have him continue with his current regimen and call should he see a trend upward in his BP readings.

## 2015-03-25 NOTE — Progress Notes (Signed)
03/25/2015 Brandon [name] [facility] December 19, 1950 [id number]   HPI:  Brandon [name] is a 64 y.o. male patient of Dr Debara Pickett, with a PMH below who presents today for hypertension clinic evaluation.  He is here with his wife.  He has a diagnosed aortic aneurysm, which appears stable, measuring 4.1 cm.  He was started on amlodipine 10 mg at his visit with Dr. Debara Pickett, when his pressure was elevated at 180/124.  Since starting that he reports no side effects or other concerns.  Looking at his medication profile he takes ibuprofen 800 mg twice daily most days of the week.  He states this is for back problems, started taking after hip surgery and has not stopped.  States he was advised to not take acetaminophen due to his alcohol consumption.  Cardiac Hx: aortic aneurysm (4.1 cm)  Family [id number] significant - one paternal uncle died from MI "at an early age"; father had hypertension; mother still living at 22  Social Hx: quit smoking cigarettes 20 years ago, cigars 7-8 years ago; drinks one coffee/day, bourbon and coke 4-5 per day  Diet: previously had oropharyngeal cancer, had difficulty with tough/hard foods.  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  Lowest reading 110/64, highest 146/105  Current antihypertensive medications: carvedilol 6.25 mg bid, amlodipine 10 mg qd  Wt Readings from Last 3 Encounters:  03/25/15 182 lb 14.4 oz (82.963 kg)  03/12/15 183 lb (83.008 kg)  12/30/14 179 lb (81.194 kg)   BP Readings from Last 3 Encounters:  03/25/15 138/88  03/12/15 180/124  12/30/14 151/96   Pulse Readings from Last 3 Encounters:  03/25/15 56  03/12/15 57  12/30/14 62    Current Outpatient Prescriptions    Medication Sig Dispense Refill  . amLODipine (NORVASC) 10 MG tablet Take 1 tablet (10 mg total) by mouth daily. 30 tablet 4  . aspirin 81 MG tablet Take 81 mg by mouth daily.    Marland Kitchen atorvastatin (LIPITOR) 40 MG tablet Take 1 tablet (40 mg total) by mouth daily. 90 tablet 3  . carvedilol (COREG) 6.25 MG tablet Take 1 tablet (6.25 mg total) by mouth 2 (two) times daily with a meal. 180 tablet 3  . gabapentin (NEURONTIN) 100 MG capsule Take 3 capsules (300 mg total) by mouth at bedtime. 270 capsule 3  . ibuprofen (ADVIL,MOTRIN) 800 MG tablet Take 800 mg by mouth every 8 (eight) hours as needed.    Marland Kitchen levothyroxine (SYNTHROID, LEVOTHROID) 50 MCG tablet Take 1 tablet (50 mcg total) by mouth daily before breakfast. 90 tablet 6  . omeprazole (PRILOSEC) 40 MG capsule TAKE 1 CAPSULE DAILY 90 capsule 2  . zolpidem (AMBIEN) 10 MG tablet Take 1 tablet (10 mg total) by mouth at bedtime as needed. 90 tablet 1   No current facility-administered medications for this visit.    Allergies  Allergen Reactions  . Benicar [Olmesartan]     fatigue    Past Medical History  Diagnosis Date  . Head and neck cancer     head and neck ca dx 03/2009  . oropharyngeal ca dx'd 03/2009    chemo/xrt comp 06/2009  . Aortic aneurysm (HCC)     small and watching  .  Allergy     mild  . GERD (gastroesophageal reflux disease)   . Hyperlipidemia   . Hypertension   . Thyroid disease     Blood pressure 138/88, pulse 56, height 5\' 10"  (1.778 m), weight 182 lb 14.4 oz (82.963 kg).    Tommy Medal PharmD CPP [facility]

## 2015-03-29 ENCOUNTER — Ambulatory Visit: Payer: 59 | Admitting: Internal Medicine

## 2015-04-15 ENCOUNTER — Encounter: Payer: Self-pay | Admitting: Internal Medicine

## 2015-04-15 ENCOUNTER — Other Ambulatory Visit: Payer: Self-pay

## 2015-04-15 MED ORDER — AMLODIPINE BESYLATE 10 MG PO TABS: 10.0000 mg | ORAL_TABLET | Freq: Every day | ORAL | Status: DC

## 2015-04-30 ENCOUNTER — Other Ambulatory Visit: Payer: Self-pay | Admitting: *Deleted

## 2015-04-30 MED ORDER — AMLODIPINE BESYLATE 10 MG PO TABS: 10.0000 mg | ORAL_TABLET | Freq: Every day | ORAL | Status: DC

## 2015-05-19 ENCOUNTER — Other Ambulatory Visit: Payer: Self-pay | Admitting: Internal Medicine

## 2015-08-06 ENCOUNTER — Encounter: Payer: Self-pay | Admitting: Internal Medicine

## 2015-08-10 ENCOUNTER — Other Ambulatory Visit: Payer: Self-pay | Admitting: Internal Medicine

## 2015-08-10 DIAGNOSIS — G4700 Insomnia, unspecified: Secondary | ICD-10-CM

## 2015-08-10 MED ORDER — ZOLPIDEM TARTRATE 10 MG PO TABS: 10.0000 mg | ORAL_TABLET | Freq: Every evening | ORAL | Status: DC | PRN

## 2015-08-11 NOTE — Telephone Encounter (Signed)
[facility]...Brandon [name]

## 2015-09-18 ENCOUNTER — Ambulatory Visit: Admitting: Neurology

## 2015-10-09 ENCOUNTER — Other Ambulatory Visit: Payer: Self-pay | Admitting: Internal Medicine

## 2015-10-28 ENCOUNTER — Ambulatory Visit
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
## 2015-11-15 ENCOUNTER — Other Ambulatory Visit: Payer: Self-pay | Admitting: Internal Medicine

## 2016-01-09 ENCOUNTER — Other Ambulatory Visit: Payer: Self-pay | Admitting: Internal Medicine

## 2016-01-12 ENCOUNTER — Ambulatory Visit

## 2016-01-12 LAB — HX HEM-ROUTINE
HX BASO #: 0 10*3/uL (ref 0.0–0.2)
HX BASO: 1 %
HX EOSIN #: 0.1 10*3/uL (ref 0.0–0.5)
HX EOSIN: 2 %
HX HCT: 46 % (ref 37.0–47.0)
HX HGB: 15.8 g/dL (ref 13.5–16.0)
HX IMMATURE GRANULOCYTE#: 0 10*3/uL (ref 0.0–0.1)
HX IMMATURE GRANULOCYTE: 0 %
HX LYMPH #: 1.6 10*3/uL (ref 1.0–4.0)
HX LYMPH: 28 %
HX MCH: 31.9 pg (ref 26.0–34.0)
HX MCHC: 34.3 g/dL (ref 32.0–36.0)
HX MCV: 92.7 fL (ref 80.0–98.0)
HX MONO #: 0.6 10*3/uL (ref 0.2–0.8)
HX MONO: 10 %
HX MPV: 10.7 fL (ref 9.1–11.7)
HX NEUT #: 3.4 10*3/uL (ref 1.5–7.5)
HX PLT: 236 10*3/uL (ref 150–400)
HX RBC BLOOD COUNT: 4.96 M/uL (ref 4.20–5.50)
HX RDW: 12.7 % (ref 11.5–14.5)
HX SEG NEUT: 59 %
HX WBC: 5.7 10*3/uL (ref 4.0–11.0)

## 2016-01-12 LAB — HX CHEM-PANELS
HX ANION GAP: 5 (ref 5–18)
HX BLOOD UREA NITROGEN: 15 mg/dL (ref 6–24)
HX CHLORIDE (CL): 106 meq/L (ref 98–110)
HX CO2: 30 meq/L (ref 20–30)
HX CREATININE (CR): 0.77 mg/dL (ref 0.57–1.30)
HX GFR, AFRICAN AMERICAN: 111 mL/min/{1.73_m2}
HX GFR, NON-AFRICAN AMERICAN: 96 mL/min/{1.73_m2}
HX GLUCOSE: 101 mg/dL (ref 70–139)
HX POTASSIUM (K): 4.8 meq/L (ref 3.6–5.1)
HX SODIUM (NA): 141 meq/L (ref 135–145)

## 2016-01-12 LAB — HX CHEM-LFT
HX ALANINE AMINOTRANSFERASE (ALT/SGPT): 36 IU/L (ref 0–54)
HX ALKALINE PHOSPHATASE (ALK): 115 IU/L (ref 40–130)
HX ASPARTATE AMINOTRANFERASE (AST/SGOT): 26 IU/L (ref 10–42)
HX BILIRUBIN, TOTAL: 0.5 mg/dL (ref 0.2–1.1)

## 2016-01-12 LAB — HX TOXICOLOGY-TDM: HX PHENYTOIN (DILANTIN), TOTAL: 12 ug/mL (ref 10.0–20.0)

## 2016-01-12 LAB — HX CHEM-OTHER: HX CALCIUM (CA): 10.1 mg/dL (ref 8.5–10.5)

## 2016-01-12 LAB — HX DIABETES: HX GLUCOSE: 101 mg/dL (ref 70–139)
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
## 2016-01-18 ENCOUNTER — Ambulatory Visit: Payer: 59 | Admitting: Internal Medicine

## 2016-01-19 ENCOUNTER — Encounter: Payer: Self-pay | Admitting: Internal Medicine

## 2016-01-19 ENCOUNTER — Ambulatory Visit (INDEPENDENT_AMBULATORY_CARE_PROVIDER_SITE_OTHER): Payer: 59 | Admitting: Internal Medicine

## 2016-01-19 ENCOUNTER — Other Ambulatory Visit (INDEPENDENT_AMBULATORY_CARE_PROVIDER_SITE_OTHER): Payer: 59

## 2016-01-19 VITALS — BP 138/86 | HR 59 | Temp 97.7°F | Resp 16 | Ht 70.0 in | Wt 180.5 lb

## 2016-01-19 DIAGNOSIS — I712 Thoracic aortic aneurysm, without rupture, unspecified: Secondary | ICD-10-CM

## 2016-01-19 DIAGNOSIS — E785 Hyperlipidemia, unspecified: Secondary | ICD-10-CM | POA: Diagnosis not present

## 2016-01-19 DIAGNOSIS — E039 Hypothyroidism, unspecified: Secondary | ICD-10-CM

## 2016-01-19 DIAGNOSIS — T451X5A Adverse effect of antineoplastic and immunosuppressive drugs, initial encounter: Secondary | ICD-10-CM

## 2016-01-19 DIAGNOSIS — Z23 Encounter for immunization: Secondary | ICD-10-CM | POA: Diagnosis not present

## 2016-01-19 DIAGNOSIS — G620 Drug-induced polyneuropathy: Secondary | ICD-10-CM

## 2016-01-19 DIAGNOSIS — I10 Essential (primary) hypertension: Secondary | ICD-10-CM

## 2016-01-19 LAB — COMPREHENSIVE METABOLIC PANEL
ALT: 20 U/L (ref 0–53)
AST: 25 U/L (ref 0–37)
Albumin: 4.2 g/dL (ref 3.5–5.2)
Alkaline Phosphatase: 60 U/L (ref 39–117)
BILIRUBIN TOTAL: 0.7 mg/dL (ref 0.2–1.2)
BUN: 23 mg/dL (ref 6–23)
CHLORIDE: 102 meq/L (ref 96–112)
CO2: 29 mEq/L (ref 19–32)
CREATININE: 0.95 mg/dL (ref 0.40–1.50)
Calcium: 9.4 mg/dL (ref 8.4–10.5)
GFR: 84.89 mL/min (ref >60.00)
Glucose, Bld: 104 mg/dL — ABNORMAL HIGH (ref 70–99)
POTASSIUM: 3.8 meq/L (ref 3.5–5.1)
SODIUM: 141 meq/L (ref 135–145)
Total Protein: 7.2 g/dL (ref 6.0–8.3)

## 2016-01-19 LAB — LIPID PANEL
CHOL/HDL RATIO: 2
Cholesterol: 181 mg/dL (ref 0–200)
HDL: 76.8 mg/dL (ref >39.00)
LDL CALC: 93 mg/dL (ref 0–99)
NONHDL: 104.58
Triglycerides: 58 mg/dL (ref 0.0–149.0)
VLDL: 11.6 mg/dL (ref 0.0–40.0)

## 2016-01-19 LAB — TSH: TSH: 4.95 u[IU]/mL — AB (ref 0.35–4.50)

## 2016-01-19 MED ORDER — LEVOTHYROXINE SODIUM 75 MCG PO TABS: 75.0000 ug | ORAL_TABLET | Freq: Every day | ORAL | 1 refills | Status: DC

## 2016-01-19 MED ORDER — CARVEDILOL 6.25 MG PO TABS: 6.2500 mg | ORAL_TABLET | Freq: Two times a day (BID) | ORAL | 1 refills | Status: DC

## 2016-01-19 MED ORDER — ROSUVASTATIN CALCIUM 20 MG PO TABS: 20.0000 mg | ORAL_TABLET | Freq: Every day | ORAL | 3 refills | Status: DC

## 2016-01-19 NOTE — Progress Notes (Signed)
Pre visit review using our clinic review tool, if applicable. No additional management support is needed unless otherwise documented below in the visit note. 

## 2016-01-19 NOTE — Patient Instructions (Signed)

## 2016-01-19 NOTE — Progress Notes (Signed)
Subjective:  Patient ID: Brandon [name], male    DOB: 1950-12-21  Age: 65 y.o. MRN: [id number]  CC: Hypertension; Hypothyroidism; and Hyperlipidemia   HPI Brandon [name] presents for Follow-up. He complains that the Lipitor tablet is too large and he wants a cholesterol medicine that is not as large as the Lipitor tablet. He is tolerating the statin well with no muscle or joint aches.  He is also due for follow-up on hypothyroidism status post neck radiation for squamous cell carcinoma. He feels like his current thyroid dose is adequate as he has had no episodes of fatigue, constipation, weight changes, or changes in his appetite.  Outpatient Medications Prior to Visit  Medication Sig Dispense Refill  . amLODipine (NORVASC) 10 MG tablet Take 1 tablet (10 mg total) by mouth daily. 90 tablet 3  . aspirin 81 MG tablet Take 81 mg by mouth daily.    Marland Kitchen gabapentin (NEURONTIN) 100 MG capsule Take 3 capsules (300 mg total) by mouth at bedtime. 270 capsule 3  . omeprazole (PRILOSEC) 40 MG capsule TAKE 1 CAPSULE DAILY 90 capsule 0  . zolpidem (AMBIEN) 10 MG tablet Take 1 tablet (10 mg total) by mouth at bedtime as needed. 90 tablet 1  . atorvastatin (LIPITOR) 40 MG tablet Take 1 tablet (40 mg total) by mouth daily. 90 tablet 3  . carvedilol (COREG) 6.25 MG tablet Take 1 tablet (6.25 mg total) by mouth 2 (two) times daily with a meal. Yearly physical is due must see MD for refills 180 tablet 0  . ibuprofen (ADVIL,MOTRIN) 800 MG tablet Take 800 mg by mouth every 8 (eight) hours as needed.    Marland Kitchen levothyroxine (SYNTHROID, LEVOTHROID) 50 MCG tablet Take 1 tablet (50 mcg total) by mouth daily before breakfast. 90 tablet 6   No facility-administered medications prior to visit.     ROS Review of Systems  Constitutional: Negative.  Negative for activity change, appetite change, diaphoresis, fatigue and unexpected weight change.  HENT: Negative.  Negative for sore throat, trouble swallowing and voice  change.   Eyes: Negative for visual disturbance.  Respiratory: Negative.  Negative for cough, choking, chest tightness, shortness of breath and stridor.   Cardiovascular: Negative.  Negative for chest pain, palpitations and leg swelling.  Gastrointestinal: Negative for abdominal pain, constipation, diarrhea, nausea and vomiting.  Endocrine: Negative.  Negative for cold intolerance and heat intolerance.  Genitourinary: Negative.   Musculoskeletal: Negative for arthralgias, back pain and myalgias.  Skin: Negative.  Negative for color change and rash.  Allergic/Immunologic: Negative.   Neurological: Negative.  Negative for dizziness and weakness.  Hematological: Negative.  Negative for adenopathy. Does not bruise/bleed easily.  Psychiatric/Behavioral: Negative.  Negative for dysphoric mood, sleep disturbance and suicidal ideas. The patient is not nervous/anxious.     Objective:  BP 138/86 (BP Location: Left Arm, Patient Position: Sitting, Cuff Size: Normal)   Pulse (!) 59   Temp 97.7 F (36.5 C) (Oral)   Resp 16   Ht 5\' 10"  (1.778 m)   Wt 180 lb 8 oz (81.9 kg)   SpO2 98%   BMI 25.90 kg/m   BP Readings from Last 3 Encounters:  01/19/16 138/86  03/25/15 138/88  03/12/15 (!) 180/124    Wt Readings from Last 3 Encounters:  01/19/16 180 lb 8 oz (81.9 kg)  03/25/15 182 lb 14.4 oz (83 kg)  03/12/15 183 lb (83 kg)    Physical Exam  Constitutional: He is oriented to person, place, and time.  No distress.  HENT:  Mouth/Throat: Oropharynx is clear and moist. No oropharyngeal exudate.  Eyes: Conjunctivae are normal. Right eye exhibits no discharge. Left eye exhibits no discharge. No scleral icterus.  Neck: Normal range of motion. Neck supple. No JVD present. No tracheal tenderness present. No tracheal deviation and no edema present. No thyroid mass and no thyromegaly present.  Cardiovascular: Normal rate, regular rhythm, normal heart sounds and intact distal pulses.  Exam reveals no  gallop and no friction rub.   No murmur heard. Pulmonary/Chest: Effort normal and breath sounds normal. No stridor. No respiratory distress. He has no wheezes. He has no rales. He exhibits no tenderness.  Abdominal: Soft. Bowel sounds are normal. He exhibits no distension and no mass. There is no tenderness. There is no rebound and no guarding.  Musculoskeletal: Normal range of motion. He exhibits no edema, tenderness or deformity.  Lymphadenopathy:    He has no cervical adenopathy.  Neurological: He is oriented to person, place, and time.  Skin: Skin is warm and dry. No rash noted. He is not diaphoretic. No erythema. No pallor.  Vitals reviewed.   Lab Results  Component Value Date   WBC 5.5 12/30/2014   HGB 14.7 12/30/2014   HCT 41.7 12/30/2014   PLT 263 12/30/2014   GLUCOSE 104 (H) 01/19/2016   CHOL 181 01/19/2016   TRIG 58.0 01/19/2016   HDL 76.80 01/19/2016   LDLDIRECT 147.7 12/03/2007   LDLCALC 93 01/19/2016   ALT 20 01/19/2016   AST 25 01/19/2016   NA 141 01/19/2016   K 3.8 01/19/2016   CL 102 01/19/2016   CREATININE 0.95 01/19/2016   BUN 23 01/19/2016   CO2 29 01/19/2016   TSH 4.95 (H) 01/19/2016   PSA 0.64 09/09/2014   INR 1.0 09/01/2008   HGBA1C 5.5 07/30/2013    Ct Angio Chest Aorta W/cm &/or Wo/cm  Result Date: 03/10/2015 CLINICAL DATA:  Followup of ascending thoracic aortic aneurysm EXAM: CT ANGIOGRAPHY CHEST WITH CONTRAST TECHNIQUE: Multidetector CT imaging of the chest was performed using the standard protocol during bolus administration of intravenous contrast. Multiplanar CT image reconstructions and MIPs were obtained to evaluate the vascular anatomy. CONTRAST:  147mL OMNIPAQUE IOHEXOL 350 MG/ML SOLN COMPARISON:  CT chest of 02/13/2014 and 11/13/2011 FINDINGS: On this enhanced study, the mid ascending aorta is measured at the same level and in the same and has previously, now measuring 4.0 cm compared to 4.1 cm previously. Recommend annual imaging followup by  CTA or MRA. This recommendation follows 2010 ACCF/AHA/AATS/ACR/ASA/SCA/SCAI/SIR/STS/SVM Guidelines for the Diagnosis and Management of Patients with Thoracic Aortic Disease. Circulation. 2010; 121SP:[id number]. The origins of the great vessels appear patent. The remainder of the thoracic aorta is normal in caliber. The heart is mildly enlarged. No pericardial effusion is seen. No mediastinal or hilar adenopathy is seen. The previously measured prevascular lymph node on the left measures 9 mm in diameter, stable compared to the CT from 2013. The pulmonary arteries are not as well opacified but no central abnormality is evident. The upper abdomen is unremarkable. A low-attenuation structure emanating from the upper pole of the left kidney is again noted complex most consistent with cyst on the images obtained. On lung window images, biapical pleural-parenchymal scarring is again noted, possibly slightly increased. Air bronchograms to course through these areas of apparent scarring in the apices. The remainder of the lungs are well aerated. No parenchymal infiltrate is seen and no effusion is noted. No lung nodule is present. There are  degenerative changes throughout the mid to lower thoracic spine. The thyroid gland is unremarkable. Review of the MIP images confirms the above findings. IMPRESSION: 1. Stable fusiform dilatation of the ascending aorta with maximum diameter of 4.0 cm compared to 4.1 cm on the prior CT. Recommend annual imaging followup by CTA or MRA. This recommendation follows 2010 ACCF/AHA/AATS/ACR/ASA/SCA/SCAI/SIR/STS/SVM Guidelines for the Diagnosis and Management of Patients with Thoracic Aortic Disease. Circulation. 2010; 121: [id number]. 2. Stable mediastinal nodes.  No adenopathy is evident. Electronically Signed   By: Ivar Drape M.D.   On: 03/10/2015 11:01    Assessment & Plan:   Brandon [name] was seen today for hypertension, hypothyroidism and hyperlipidemia.  Diagnoses and all orders for this  visit:  Essential hypertension, benign- His blood pressure is well-controlled, electrolytes and renal function are stable. -     Comprehensive metabolic panel; Future -     carvedilol (COREG) 6.25 MG tablet; Take 1 tablet (6.25 mg total) by mouth 2 (two) times daily with a meal.  Hypothyroidism, unspecified type- his TSH is in the normal range, he will remain on the current dose of levothyroxine. -     TSH; Future -     levothyroxine (SYNTHROID, LEVOTHROID) 75 MCG tablet; Take 1 tablet (75 mcg total) by mouth daily.  Thoracic aortic aneurysm without rupture ([place])- to reduce complications from this will continue blood pressure control with the beta blocker and statin therapy. -     rosuvastatin (CRESTOR) 20 MG tablet; Take 1 tablet (20 mg total) by mouth daily. -     carvedilol (COREG) 6.25 MG tablet; Take 1 tablet (6.25 mg total) by mouth 2 (two) times daily with a meal.  Neuropathy due to chemotherapeutic drug ([place])- his symptoms are well controlled with gabapentin.  Hyperlipidemia with target LDL less than 130--     rosuvastatin (CRESTOR) 20 MG tablet; Take 1 tablet (20 mg total) by mouth daily.- We'll try Crestor, I think the tablet is smaller. -     Lipid panel; Future -     Comprehensive metabolic panel; Future  Need for prophylactic vaccination against Streptococcus pneumoniae (pneumococcus) -     Pneumococcal conjugate vaccine 13-valent   I have discontinued Mr. Leedy's atorvastatin, ibuprofen, and levothyroxine. I have also changed his carvedilol. Additionally, I am having him start on rosuvastatin and levothyroxine. Lastly, I am having him maintain his gabapentin, aspirin, amLODipine, zolpidem, and omeprazole.  Meds ordered this encounter  Medications  . rosuvastatin (CRESTOR) 20 MG tablet    Sig: Take 1 tablet (20 mg total) by mouth daily.    Dispense:  90 tablet    Refill:  3  . carvedilol (COREG) 6.25 MG tablet    Sig: Take 1 tablet (6.25 mg total) by mouth 2 (two) times  daily with a meal.    Dispense:  180 tablet    Refill:  1  . levothyroxine (SYNTHROID, LEVOTHROID) 75 MCG tablet    Sig: Take 1 tablet (75 mcg total) by mouth daily.    Dispense:  90 tablet    Refill:  1     Follow-up: Return in about 4 months (around 05/21/2016).  Scarlette Calico, MD

## 2016-01-24 ENCOUNTER — Telehealth: Payer: Self-pay | Admitting: Internal Medicine

## 2016-01-24 DIAGNOSIS — I712 Thoracic aortic aneurysm, without rupture, unspecified: Secondary | ICD-10-CM

## 2016-01-24 NOTE — Telephone Encounter (Signed)
Patient gets yearly ct to follow aortic aneuyrsm.  Need order to schedule one for November or December prior to his appt with dr Debara Pickett.

## 2016-01-24 NOTE — Telephone Encounter (Signed)
SPOKE TO WIFE. INFORMED WIFE ORDER PLACED SHOULD BE DONE AFTER 03/09/16 PATIENT DOES NOT NEED LABS.   WIFE VERBALIZED UNDERSTANDING.

## 2016-02-13 ENCOUNTER — Other Ambulatory Visit: Payer: Self-pay | Admitting: Internal Medicine

## 2016-03-06 ENCOUNTER — Other Ambulatory Visit: Payer: Self-pay | Admitting: *Deleted

## 2016-03-06 ENCOUNTER — Other Ambulatory Visit: Payer: 59

## 2016-03-06 DIAGNOSIS — I712 Thoracic aortic aneurysm, without rupture, unspecified: Secondary | ICD-10-CM

## 2016-03-06 DIAGNOSIS — Z01818 Encounter for other preprocedural examination: Secondary | ICD-10-CM

## 2016-03-06 NOTE — Progress Notes (Unsigned)
ORDER BMP FOR UPCOMING  CT SCAN.

## 2016-03-07 ENCOUNTER — Other Ambulatory Visit: Payer: 59 | Admitting: *Deleted

## 2016-03-07 ENCOUNTER — Other Ambulatory Visit: Payer: Self-pay | Admitting: Internal Medicine

## 2016-03-07 ENCOUNTER — Encounter: Payer: Self-pay | Admitting: Internal Medicine

## 2016-03-07 DIAGNOSIS — F5101 Primary insomnia: Secondary | ICD-10-CM

## 2016-03-07 DIAGNOSIS — Z01818 Encounter for other preprocedural examination: Secondary | ICD-10-CM

## 2016-03-07 LAB — BASIC METABOLIC PANEL
BUN: 20 mg/dL (ref 7–25)
CO2: 29 mmol/L (ref 20–31)
Calcium: 9.8 mg/dL (ref 8.6–10.3)
Chloride: 103 mmol/L (ref 98–110)
Creat: 0.87 mg/dL (ref 0.70–1.25)
GLUCOSE: 90 mg/dL (ref 65–99)
POTASSIUM: 4.4 mmol/L (ref 3.5–5.3)
SODIUM: 142 mmol/L (ref 135–146)

## 2016-03-07 MED ORDER — ZOLPIDEM TARTRATE 10 MG PO TABS: 10.0000 mg | ORAL_TABLET | Freq: Every evening | ORAL | 1 refills | Status: DC | PRN

## 2016-03-09 ENCOUNTER — Ambulatory Visit (INDEPENDENT_AMBULATORY_CARE_PROVIDER_SITE_OTHER)
Admission: RE | Admit: 2016-03-09 | Discharge: 2016-03-09 | Disposition: A | Discharge status: Home / Self Care | Payer: 59 | Source: Ambulatory Visit | Attending: Internal Medicine | Admitting: Internal Medicine

## 2016-03-09 ENCOUNTER — Encounter: Payer: Self-pay | Admitting: Internal Medicine

## 2016-03-09 DIAGNOSIS — I712 Thoracic aortic aneurysm, without rupture, unspecified: Secondary | ICD-10-CM

## 2016-03-09 MED ORDER — IOPAMIDOL (ISOVUE-370) INJECTION 76%
100.0000 mL | Freq: Once | INTRAVENOUS | Status: AC | PRN
  Administered 2016-03-09: 100 mL via INTRAVENOUS

## 2016-03-10 ENCOUNTER — Other Ambulatory Visit: Payer: 59

## 2016-03-16 ENCOUNTER — Ambulatory Visit: Admit: 2016-03-16 | Payer: No Typology Code available for payment source

## 2016-03-16 ENCOUNTER — Ambulatory Visit: Admitting: Neurology

## 2016-03-16 DIAGNOSIS — ? BIPOLAR CURR DEPRESSED MI: Secondary | ICD-10-CM

## 2016-03-16 DIAGNOSIS — G40909 Epilepsy, unspecified, not intractable, without status epilepticus: Principal | ICD-10-CM

## 2016-03-16 NOTE — Progress Notes (Signed)
* * *        **[name], [name]**    --- ---    47 Y old Male, DOB: June 25, 1950, External MRN: [id number]    Account Number: [id number]    [address], [place], [place]    Home: [phone]    Insurance: [employer]    PCP: Alessandra Grout, MD Referring: Alessandra Grout, MD    Appointment Facility: Neurology        * * *    03/16/2016  Progress Notes: [name] **CHN#:** [id number]    --- ---    ---        Reason for Appointment    ---      1\. OK BY DR [name]    ---      History of Present Illness    ---     _GENERAL_ :    Dear Dr Marin Shutter- This patient is a 66 year old patient- well known to me- who  is due to present today for filling out of [employer] forms/ otherwise has no acute  issues/see prior notes for full delineation, added in for brief appointment  today.     _Ambulatory Falls and Injury Prevention_ :    Ambulatory Falls and Injury Prevention    Have you experienced a fall in the past year? _Two or more falls without  injury in the past year_    Is the patient using assistive devices such as a cane or walker? _No_    Do you need assistance with ambulation while at our facility? _No_       Current Medications    ---    Taking     * Aspirin 81 MG Tablet Chewable 1 tablet Orally Once a day    ---    * Atorvastatin Calcium 10 MG Tablet 1 tablet Orally Once a day    ---    * CoQ10     ---    * Dilantin 100 mg Capsule 1 capsule Orally- DAW- DISPENSE AS WRITTEN MEDICALLY NECESSARY 4 pills in the am with 30 mg tablet, Notes: 400 mg/day    ---    * Omeprazole 20 MG Capsule Delayed Release 1 capsule Orally Once a day    ---    * Medication List reviewed and reconciled with the patient    ---      Past Medical History    ---       See notes- seizures/cardiovascular disease.        ---    Seizures.        ---      Surgical History    ---      No Surgical History documented.    ---      Family History    ---      No FH seizures.    ---      Social History    ---    Tobacco    history: _Never  smoked_   no alcohol or illicits.    ---      Allergies    ---      N.K.D.A.    ---      Hospitalization/Major Diagnostic Procedure    ---      No Hospitalization History.    ---      Vital Signs    ---    Pain scale 0, Ht-in 5 ft 10 in, Wt-lbs 197, BMI 28.26, BP 136/89, HR  83, RR  18, Temp 35.9, BSA 2.10, O2 100%, Ht-cm 177.8, Wt-kg 89.36, Wt Change 3 lb.      Physical Examination    ---    full exam deferred but awake, oriented, conversant, ambulatory, seems grossly  baseline.      Assessments    ---    1\. Seizure disorder - G40.909 (Primary)    ---      Patient with seizure disorder here for [facility]V form filling out/processing, face  to face visit/see above    PLAN : Seizure/safety precautions, no driving unless cleared by RMV/ all  questions answered, forms filled out will be scanned to chart, RV [name].    ---      Follow Up    ---    2 Months    Electronically signed by [name] MD on 03/16/2016 at 03:54 PM EST    Sign off status: Completed        * * *        Neurology    [address]    [place], [place] [address]    Tel: [phone]    Fax: [phone]              * * *          Patient: [name] [name], [name] [name] DOB: 1950/07/17 Progress Note: [name] [name] 03/16/2016    ---    Note generated by eClinicalWorks EMR/PM Software ([email or web address])

## 2016-03-16 NOTE — Progress Notes (Signed)
.    Progress Notes  .  Patient: [name] [name], [name] [name]  Provider: [name]  MD  .  DOB: 04-30-1950 Age: 64 Y Sex: Male  .  PCP: Alessandra Grout MD  Date: 03/16/2016  .  --------------------------------------------------------------------------------  .  REASON FOR APPOINTMENT  .  1. OK BY DR [name]  .  HISTORY OF PRESENT ILLNESS  .  GENERAL:   Dear Dr Marin Shutter- This patient is a 65 year old patient- well  known to me- who is due to present today for filling out of [facility]  forms/ otherwise has no acute issues/see prior notes for full  delineation, added in for brief appointment today.  .  Ambulatory Falls and Injury Prevention:  Ambulatory Falls and Injury Prevention  .  Marland Kitchen  Have you experienced a fall in the past year? Two or more falls  without injury in the past year  Is the patient using assistive devices such as a cane or  walker?No  Do you need assistance with ambulation while at our facility?No  .  CURRENT MEDICATIONS  .  Taking Aspirin 81 MG Tablet Chewable 1 tablet Orally Once a day  Taking Atorvastatin Calcium 10 MG Tablet 1 tablet Orally Once a  day  Taking CoQ10  Taking Dilantin 100 mg Capsule 1 capsule Orally- DAW- DISPENSE AS  WRITTEN MEDICALLY NECESSARY 4 pills in the am with 30 mg tablet,  Notes: 400 mg/day  Taking Omeprazole 20 MG Capsule Delayed Release 1 capsule Orally  Once a day  Medication List reviewed and reconciled with the patient  .  PAST MEDICAL HISTORY  .  See notes- seizures/cardiovascular disease  Seizures  .  ALLERGIES  .  N.K.D.A.  .  SURGICAL HISTORY  .  No Surgical History documented.  Marland Kitchen  FAMILY HISTORY  .  No FH seizures.  .  SOCIAL HISTORY  .  .  Tobacco  history:Never smoked  .  no alcohol or illicits.  .  HOSPITALIZATION/MAJOR DIAGNOSTIC PROCEDURE  .  No Hospitalization History.  Marland Kitchen  VITAL SIGNS  .  Pain scale 0, Ht-in 5 ft 10 in, Wt-lbs 197, BMI 28.26, BP 136/89,  HR 83, RR 18, Temp 35.9, BSA 2.10, O2 100%, Ht-cm 177.8, Wt-kg  89.36, Wt Change 3 lb.  .  PHYSICAL EXAMINATION  .  full  exam deferred but awake, oriented, conversant, ambulatory,  seems grossly baseline.  .  ASSESSMENTS  .  Seizure disorder - G40.909 (Primary)  .  Patient with seizure disorder here for [facility] form filling  out/processing, face to face visit/see abovePLAN : Seizure/safety  precautions, no driving unless cleared by [facility]V/ all questions  answered, forms filled out will be scanned to chart, RV [name]  MD.  .  FOLLOW UP  .  2 Months  .  Electronically signed by [name] MD on  03/16/2016 at 03:54 PM EST  .  Document electronically signed by [name]  MD  .

## 2016-03-22 ENCOUNTER — Ambulatory Visit (INDEPENDENT_AMBULATORY_CARE_PROVIDER_SITE_OTHER): Payer: 59 | Admitting: Internal Medicine

## 2016-03-22 ENCOUNTER — Encounter: Payer: Self-pay | Admitting: Internal Medicine

## 2016-03-22 VITALS — BP 140/90 | HR 52 | Ht 70.0 in | Wt 185.0 lb

## 2016-03-22 DIAGNOSIS — I712 Thoracic aortic aneurysm, without rupture, unspecified: Secondary | ICD-10-CM

## 2016-03-22 DIAGNOSIS — E785 Hyperlipidemia, unspecified: Secondary | ICD-10-CM | POA: Diagnosis not present

## 2016-03-22 DIAGNOSIS — I10 Essential (primary) hypertension: Secondary | ICD-10-CM | POA: Diagnosis not present

## 2016-03-22 MED ORDER — VALSARTAN 80 MG PO TABS: 80.0000 mg | ORAL_TABLET | Freq: Every day | ORAL | 5 refills | Status: DC

## 2016-03-22 NOTE — Patient Instructions (Signed)
Your physician has recommended you make the following change in your medication: START valsartan 80mg  once daily  Your physician recommends that you schedule a follow-up appointment in: [place] with clinical pharmacist for a BP check   Your physician wants you to follow-up in: ONE YEAR with Dr. Debara Pickett. You will receive a reminder letter in the mail two months in advance. If you don't receive a letter, please call our office to schedule the follow-up appointment.

## 2016-03-22 NOTE — Progress Notes (Signed)
OFFICE NOTE  Chief Complaint:  Follow-up aortic aneurysm, hypertension  Primary Care Physician: Scarlette Calico, MD  HPI:  Brandon [name] is a pleasant 65 year old male who goes by "skip". He has a history of throat cancer and received radiation and chemotherapy. Subsequently he is undergone screening CT scans and was noted to have some pulmonary nodules. There is a history of cigar smoking in the past. Recently his CT scan indicated a small ascending aortic aneurysm, measuring 4.1 cm. This was not previously reported. He is completely asymptomatic, denies any chest pain or shortness of breath. There is no history of aneurysm to his knowledge in the family. He does have borderline hypertension and was never treated with medications until recently. Based on the borderline elevated blood pressures and hypertension he was started on Bystolic, which she seems to be tolerating well. Blood pressure today is 126/84 and heart rate is well-controlled at 54.  [name] returns today for follow-up of his aortic aneurysm. He recently underwent a repeat CT scan which shows a stable 4.1 cm fusiform ascending aortic aneurysm. He is asymptomatic with this. He denies any chest pain or worsening shortness of breath. He was previously on by systolic and had good blood pressure control, however felt that he was having side effects from the medicine and was ultimately switched to carvedilol. That was recently increased to 6.25 mg twice a day. Heart rate is now in the upper 50s however blood pressure the office today was 180/124. He did take his medicine this morning. I rechecked that and still was elevated at 178/120. He will need additional blood pressure control. Currently denies any headache, shortness of breath, chest pain, blurred vision or change in urine output.  03/22/2016  [name] was seen today in follow-up. He underwent a repeat CT scan about a week ago which demonstrated no significant change in his  ascending aortic aneurysm at 4.0 cm. Blood pressure has improved significantly however tends to be between [id number] to [id number] systolic. Based on new American Heart Association guidelines and the fact that he has a aortic aneurysm, he should have stricter blood pressure control. There also may be some evidence that ACE inhibitor or ARB can be helpful at decreasing the likelihood of aneurysm extension. He is currently on amlodipine and carvedilol. Heart rate is in the 50s. Blood pressure today 140/90. Recently his primary care provider switched him from Lipitor to Crestor because of difficulty swallowing the larger pills. He reports being asymptomatic and denies chest pain or shortness of breath.  PMHx:  Past Medical History:  Diagnosis Date  . Allergy    mild  . Aortic aneurysm (HCC)    small and watching  . GERD (gastroesophageal reflux disease)   . Head and neck cancer    head and neck ca dx 03/2009  . Hyperlipidemia   . Hypertension   . oropharyngeal ca dx'd 03/2009   chemo/xrt comp 06/2009  . Thyroid disease     Past Surgical History:  Procedure Laterality Date  . HERNIA REPAIR     inguinal  . hip replacemnt left Left   . Left eye    . stmach surgery     repair peg tube site    FAMHx:  Family History  Problem Relation Age of Onset  . Cancer Mother     lymphoma  . Colon cancer Mother   . Cancer Father     lung (smoker)  . Hyperlipidemia Father   . Hypertension Father   .  Diabetes Father   . Esophageal cancer Neg Hx   . Rectal cancer Neg Hx   . Stomach cancer Neg Hx     SOCHx:   reports that he quit smoking about 6 years ago. His smoking use included Cigarettes and Cigars. He started smoking about 42 years ago. He has a 52.50 pack-year smoking history. He has never used smokeless tobacco. He reports that he drinks about 9.0 oz of alcohol per week . He reports that he does not use drugs.  ALLERGIES:  Allergies  Allergen Reactions  . Benicar [Olmesartan]     fatigue     ROS: Pertinent items noted in HPI and remainder of comprehensive ROS otherwise negative.  HOME MEDS: Current Outpatient Prescriptions  Medication Sig Dispense Refill  . amLODipine (NORVASC) 10 MG tablet Take 1 tablet (10 mg total) by mouth daily. 90 tablet 3  . aspirin 81 MG tablet Take 81 mg by mouth daily.    . carvedilol (COREG) 6.25 MG tablet Take 1 tablet (6.25 mg total) by mouth 2 (two) times daily with a meal. 180 tablet 1  . gabapentin (NEURONTIN) 100 MG capsule Take 3 capsules (300 mg total) by mouth at bedtime. 270 capsule 3  . levothyroxine (SYNTHROID, LEVOTHROID) 75 MCG tablet Take 1 tablet (75 mcg total) by mouth daily. 90 tablet 1  . omeprazole (PRILOSEC) 40 MG capsule TAKE 1 CAPSULE DAILY 90 capsule 3  . rosuvastatin (CRESTOR) 20 MG tablet Take 1 tablet (20 mg total) by mouth daily. 90 tablet 3  . zolpidem (AMBIEN) 10 MG tablet Take 1 tablet (10 mg total) by mouth at bedtime as needed. 90 tablet 1  . valsartan (DIOVAN) 80 MG tablet Take 1 tablet (80 mg total) by mouth daily. 30 tablet 5   No current facility-administered medications for this visit.     LABS/IMAGING: No results found for this or any previous visit (from the past 48 hour(s)). No results found.  VITALS: BP 140/90   Pulse (!) 52   Ht 5\' 10"  (1.778 m)   Wt 185 lb (83.9 kg)   BMI 26.54 kg/m   EXAM: General appearance: alert and no distress Neck: no carotid bruit and no JVD Lungs: clear to auscultation bilaterally Heart: regular rate and rhythm, S1, S2 normal, no murmur, click, rub or gallop Abdomen: soft, non-tender; bowel sounds normal; no masses,  no organomegaly Extremities: extremities normal, atraumatic, no cyanosis or edema Pulses: 2+ and symmetric Skin: Skin color, texture, turgor normal. No rashes or lesions Neurologic: Grossly normal Psych: Normal  EKG: Sinus bradycardia with 1st degree AVB at 52  ASSESSMENT: 1. Mild ascending aortic aneurysm-4.0 cm (2017, stable) 2. History of  tobacco abuse-resolved 3. Hypertension-uncontrolled  PLAN: 1.   Mr. Underdahl continues to have elevated blood pressure although it is improved significantly. He would and if it from being on an ACE or ARB. I recommend trying Diovan 80 mg daily in addition to his current medications. Apparently he had been on Benicar the past which she said caused some fatigue, but is not clear whether that was all medication related. I like to retry a different ARB to see if it is helpful for him. We'll schedule him for hypertension clinic follow-up in the next few weeks. Plan otherwise to see him back annually or sooner as necessary.  Pixie Casino, MD, [facility] Attending Cardiologist [place] 03/22/2016, 10:35 AM

## 2016-04-19 ENCOUNTER — Ambulatory Visit (HOSPITAL_BASED_OUTPATIENT_CLINIC_OR_DEPARTMENT_OTHER)

## 2016-04-19 ENCOUNTER — Other Ambulatory Visit: Payer: Self-pay | Admitting: *Deleted

## 2016-04-19 DIAGNOSIS — I712 Thoracic aortic aneurysm, without rupture, unspecified: Secondary | ICD-10-CM

## 2016-04-25 ENCOUNTER — Ambulatory Visit (INDEPENDENT_AMBULATORY_CARE_PROVIDER_SITE_OTHER): Payer: 59 | Admitting: Pharmacist Clinician (PhC)/ Clinical Pharmacy Specialist

## 2016-04-25 ENCOUNTER — Encounter: Payer: Self-pay | Admitting: Pharmacist Clinician (PhC)/ Clinical Pharmacy Specialist

## 2016-04-25 DIAGNOSIS — I10 Essential (primary) hypertension: Secondary | ICD-10-CM | POA: Diagnosis not present

## 2016-04-25 NOTE — Assessment & Plan Note (Signed)
Patient with hypertension and thoracic aortic aneurysm, currently taking carvedilol, amlodipine and valsartan.  While his afternoon readings look good, mostly 123456 systolics, he is still getting some higher readings in the mornings.  Home BP cuff (Omron) read within 5 points of office readings.  For now I am going to have him move the amlodipine to evenings.  He will continue to take home BP readings twice daily and I will see him back in a month for follow up.  He did have an elevated reading in the office today, unsure if this is what he is truly running in the mornings (he only checked in mornings twice since seeing MD - because the afternoon readings were "better"), or if there is an element of [place] coat hypertension.

## 2016-04-25 NOTE — Patient Instructions (Signed)
Return for a a follow up appointment in 1 month  Your blood pressure today is 148/94  (goal is < 130/80)  Check your blood pressure at home twice daily most day of the week, and keep record of the readings.  Take your BP meds as follows:  AM:  Carvedilol 6.25 mg, valsartan 80 mg  PM:  Carvedilol 6.25 mg, amlodipine 10 mg  Bring all of your meds, your BP cuff and your record of home blood pressures to your next appointment.  Exercise as you're able, try to walk approximately 30 minutes per day.  Keep salt intake to a minimum, especially watch canned and prepared boxed foods.  Eat more fresh fruits and vegetables and fewer canned items.  Avoid eating in fast food restaurants.    HOW TO TAKE YOUR BLOOD PRESSURE: . Rest 5 minutes before taking your blood pressure. .  Don't smoke or drink caffeinated beverages for at least 30 minutes before. . Take your blood pressure before (not after) you eat. . Sit comfortably with your back supported and both feet on the floor (don't cross your legs). . Elevate your arm to heart level on a table or a desk. . Use the proper sized cuff. It should fit smoothly and snugly around your bare upper arm. There should be enough room to slip a fingertip under the cuff. The bottom edge of the cuff should be 1 inch above the crease of the elbow. . Ideally, take 3 measurements at one sitting and record the average.

## 2016-04-25 NOTE — Progress Notes (Signed)
04/25/2016 Brandon [name] [facility] 11-17-1950 [id number]   HPI:  Brandon [name] is a 66 y.o. male patient of Dr Debara Pickett, with a PMH below who presents today for hypertension clinic evaluation.  He was seen by Dr. Debara Pickett last month for evaluation of a thoracic aortic aneurysm.  He also has a history of throat cancer, for which he received chemo and radiation.   He still notes some neuropathy, for which he takes gabapentin as needed.  Today he comes in with his wife, as well as his home BP cuff, a newer Omron model which is approximately 65 year old.   He does note a slight amount of lower extremity edema most days, but has not been a problem for him.    Blood Pressure Goal:  130/80  Current Medications:  Carvedilol 6.25 mg bid  Amlodipine 10 mg qd  Valsartan 80 mg qd   Family Hx:  Father and sister with hypertension  Social Hx:  No tobacco fo r> 10 years; endorses 3 bourbon/coke most days of the week; drinks 1 cup of coffee each morning.    Diet:  Eats home cooked about 1/2 meals, otherwise sit-down restaurants.  Rarely eats fried foods and does not add salt to his meals.  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  Only has checked 5-10 times since visit with Dr. Debara Pickett and notes that morning readings are mostly 130-140 range, however afternoons are almost always in the [id number] systolic.  Diastolic were [id number].  Intolerances:   none  Wt Readings from Last 3 Encounters:  03/22/16 185 lb (83.9 kg)  01/19/16 180 lb 8 oz (81.9 kg)  03/25/15 182 lb 14.4 oz (83 kg)   BP Readings from Last 3 Encounters:  04/25/16 (!) 148/94  03/22/16 140/90  01/19/16 138/86   Pulse Readings from Last 3 Encounters:  04/25/16 60  03/22/16 (!) 52  01/19/16 (!) 59    Current  Outpatient Prescriptions  Medication Sig Dispense Refill  . amLODipine (NORVASC) 10 MG tablet Take 1 tablet (10 mg total) by mouth daily. 90 tablet 3  . aspirin 81 MG tablet Take 81 mg by mouth daily.    . carvedilol (COREG) 6.25 MG tablet Take 1 tablet (6.25 mg total) by mouth 2 (two) times daily with a meal. 180 tablet 1  . gabapentin (NEURONTIN) 100 MG capsule Take 3 capsules (300 mg total) by mouth at bedtime. 270 capsule 3  . levothyroxine (SYNTHROID, LEVOTHROID) 75 MCG tablet Take 1 tablet (75 mcg total) by mouth daily. 90 tablet 1  . omeprazole (PRILOSEC) 40 MG capsule TAKE 1 CAPSULE DAILY 90 capsule 3  . rosuvastatin (CRESTOR) 20 MG tablet Take 1 tablet (20 mg total) by mouth daily. 90 tablet 3  . valsartan (DIOVAN) 80 MG tablet Take 1 tablet (80 mg total) by mouth daily. 30 tablet 5  . zolpidem (AMBIEN) 10 MG tablet Take 1 tablet (10 mg total) by mouth at bedtime as needed. 90 tablet 1   No current facility-administered medications for this visit.     Allergies  Allergen Reactions  . Benicar [Olmesartan]     fatigue    Past Medical History:  Diagnosis Date  . Allergy    mild  . Aortic aneurysm ([place])  small and watching  . GERD (gastroesophageal reflux disease)   . Head and neck cancer    head and neck ca dx 03/2009  . Hyperlipidemia   . Hypertension   . oropharyngeal ca dx'd 03/2009   chemo/xrt comp 06/2009  . Thyroid disease     Blood pressure (!) 148/94, pulse 60.  Essential hypertension, benign Patient with hypertension and thoracic aortic aneurysm, currently taking carvedilol, amlodipine and valsartan.  While his afternoon readings look good, mostly 123456 systolics, he is still getting some higher readings in the mornings.  Home BP cuff (Omron) read within 5 points of office readings.  For now I am going to have him move the amlodipine to evenings.  He will continue to take home BP readings twice daily and I will see him back in a month for follow up.  He did have  an elevated reading in the office today, unsure if this is what he is truly running in the mornings (he only checked in mornings twice since seeing MD - because the afternoon readings were "better"), or if there is an element of [name] hypertension.     Tommy Medal PharmD CPP [facility]

## 2016-05-02 ENCOUNTER — Other Ambulatory Visit: Payer: Self-pay | Admitting: Internal Medicine

## 2016-05-02 NOTE — Telephone Encounter (Signed)
REFILL 

## 2016-05-23 ENCOUNTER — Ambulatory Visit: Payer: 59

## 2016-05-29 ENCOUNTER — Ambulatory Visit (INDEPENDENT_AMBULATORY_CARE_PROVIDER_SITE_OTHER): Payer: 59 | Admitting: Pharmacist Clinician (PhC)/ Clinical Pharmacy Specialist

## 2016-05-29 DIAGNOSIS — I10 Essential (primary) hypertension: Secondary | ICD-10-CM | POA: Diagnosis not present

## 2016-05-29 MED ORDER — VALSARTAN 80 MG PO TABS: 80.0000 mg | ORAL_TABLET | Freq: Every day | ORAL | 2 refills | Status: DC

## 2016-05-29 NOTE — Progress Notes (Signed)
05/29/2016 Brandon [name] [facility] 30-Nov-1950 [id number]   HPI:  Brandon [name] is a 66 y.o. male patient of Dr Debara Pickett, with a PMH below who presents today for hypertension clinic follow up  He was seen by Dr. Debara Pickett last month for evaluation of a thoracic aortic aneurysm.  He also has a history of throat cancer, for which he received chemo and radiation.   He still notes some neuropath, and takes gabapentin as needed.  Today he comes in with his wife, as well as his home BP cuff, a newer Omron model which is approximately 65 year old.   He has no complaints of SOB, DOE, or CP, but continues to note mild LEE some days.    Blood Pressure Goal:  130/80  Current Medications:  Carvedilol 6.25 mg bid  Amlodipine 10 mg qd (pm)  Valsartan 80 mg qd (am)   Family Hx:  Father and sister with hypertension  Social Hx:  No tobacco for> 10 years; endorses 3 bourbon/coke most days of the week; drinks 1 cup of coffee each morning.    Diet:  Eats home cooked about 1/2 meals, otherwise sit-down restaurants.  Rarely eats fried foods and does not add salt to his meals.  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  AM average (12 readings) 117/84 with range of 99-138/77-97.  Evening readings (7) average 113/75 with range of 103-122/71-86.  Intolerances:   none  Wt Readings from Last 3 Encounters:  03/22/16 185 lb (83.9 kg)  01/19/16 180 lb 8 oz (81.9 kg)  03/25/15 182 lb 14.4 oz (83 kg)   BP Readings from Last 3 Encounters:  05/29/16 130/82  04/25/16 (!) 148/94  03/22/16 140/90   Pulse Readings from Last 3 Encounters:  05/29/16 60  04/25/16 60  03/22/16 (!) 52    Current Outpatient Prescriptions  Medication Sig Dispense Refill  . amLODipine (NORVASC) 10 MG tablet TAKE 1 TABLET  DAILY 90 tablet 3  . aspirin 81 MG tablet Take 81 mg by mouth daily.    . carvedilol (COREG) 6.25 MG tablet Take 1 tablet (6.25 mg total) by mouth 2 (two) times daily with a meal. 180 tablet 1  . gabapentin (NEURONTIN) 100 MG capsule Take 3 capsules (300 mg total) by mouth at bedtime. 270 capsule 3  . levothyroxine (SYNTHROID, LEVOTHROID) 75 MCG tablet Take 1 tablet (75 mcg total) by mouth daily. 90 tablet 1  . omeprazole (PRILOSEC) 40 MG capsule TAKE 1 CAPSULE DAILY 90 capsule 3  . rosuvastatin (CRESTOR) 20 MG tablet Take 1 tablet (20 mg total) by mouth daily. 90 tablet 3  . valsartan (DIOVAN) 80 MG tablet Take 1 tablet (80 mg total) by mouth daily. 90 tablet 2  . zolpidem (AMBIEN) 10 MG tablet Take 1 tablet (10 mg total) by mouth at bedtime as needed. 90 tablet 1   No current facility-administered medications for this visit.     Allergies  Allergen Reactions  . Benicar [Olmesartan]     fatigue    Past Medical History:  Diagnosis Date  . Allergy    mild  . Aortic aneurysm (HCC)    small and watching  . GERD (gastroesophageal reflux disease)   . Head and neck cancer    head  and neck ca dx 03/2009  . Hyperlipidemia   . Hypertension   . oropharyngeal ca dx'd 03/2009   chemo/xrt comp 06/2009  . Thyroid disease     Blood pressure 130/82, pulse 60.  Essential hypertension, benign Patient with cardiac history significant for thoracic aortic aneurysm, blood pressure much improved today at 130/82.  No changes in medication today.  He will continue to check readings at home several times each week, and will call if he notices that the diastolic readings continue to run high.      Tommy Medal PharmD CPP [facility]

## 2016-05-29 NOTE — Patient Instructions (Signed)
   Your blood pressure today is 130/82  Check your blood pressure at home 3-4 times per week and keep record of the readings.  Take your BP meds as follows:  Continue with all current medications  Bring all of your meds, your BP cuff and your record of home blood pressures to your next appointment.  Exercise as you're able, try to walk approximately 30 minutes per day.  Keep salt intake to a minimum, especially watch canned and prepared boxed foods.  Eat more fresh fruits and vegetables and fewer canned items.  Avoid eating in fast food restaurants.    HOW TO TAKE YOUR BLOOD PRESSURE: . Rest 5 minutes before taking your blood pressure. .  Don't smoke or drink caffeinated beverages for at least 30 minutes before. . Take your blood pressure before (not after) you eat. . Sit comfortably with your back supported and both feet on the floor (don't cross your legs). . Elevate your arm to heart level on a table or a desk. . Use the proper sized cuff. It should fit smoothly and snugly around your bare upper arm. There should be enough room to slip a fingertip under the cuff. The bottom edge of the cuff should be 1 inch above the crease of the elbow. . Ideally, take 3 measurements at one sitting and record the average.

## 2016-05-29 NOTE — Assessment & Plan Note (Signed)
Patient with cardiac history significant for thoracic aortic aneurysm, blood pressure much improved today at 130/82.  No changes in medication today.  He will continue to check readings at home several times each week, and will call if he notices that the diastolic readings continue to run high.

## 2016-05-30 ENCOUNTER — Encounter: Payer: Self-pay | Admitting: Internal Medicine

## 2016-06-02 NOTE — Telephone Encounter (Signed)
They are faxing form over for me to fill out.

## 2016-06-08 NOTE — Telephone Encounter (Signed)
Called number and spoke to spouse.  They do not have the information that I need.   I will have to call [facility] to see if they have the information I need.

## 2016-06-29 ENCOUNTER — Other Ambulatory Visit: Payer: Self-pay | Admitting: Internal Medicine

## 2016-06-29 ENCOUNTER — Telehealth: Payer: Self-pay | Admitting: Internal Medicine

## 2016-06-29 DIAGNOSIS — E039 Hypothyroidism, unspecified: Secondary | ICD-10-CM

## 2016-06-29 MED ORDER — VALSARTAN 160 MG PO TABS: 160.0000 mg | ORAL_TABLET | Freq: Every day | ORAL | 0 refills | Status: DC

## 2016-06-29 NOTE — Telephone Encounter (Signed)
Patient Wife calling, states that she was advised to call back if the patient's BP did not go down. Mrs. Lowrey states that his BP is progressively getting higher. BP this morning was 145/100. Please call to discuss, thanks.

## 2016-06-29 NOTE — Telephone Encounter (Signed)
Returned call, wife states home BP readings [phone] systolic, 07-218 diastolic.  Has been taking amlodipine 10 mg qd, valsartan 80 mg qd and carvedilol 6.25 mg bid.  Wife notes that his BP did fine for the first 3-4 weeks on this regimen, but in the past week all diastolic readings have been creeping towards 100.    Will increase valsartan to 160 mg daily.  Wife states they just received a 90 day supply of the 80 mg tabs, so will double up.  Will have him return to office for follow up in 2-3 weeks, appt made.  Wife understands to call in the meantime should his pressure remain elevated or she has other concerns.

## 2016-07-17 ENCOUNTER — Ambulatory Visit (HOSPITAL_BASED_OUTPATIENT_CLINIC_OR_DEPARTMENT_OTHER)

## 2016-07-17 ENCOUNTER — Other Ambulatory Visit: Payer: Self-pay | Admitting: Internal Medicine

## 2016-07-17 DIAGNOSIS — I10 Essential (primary) hypertension: Secondary | ICD-10-CM

## 2016-07-17 DIAGNOSIS — I712 Thoracic aortic aneurysm, without rupture, unspecified: Secondary | ICD-10-CM

## 2016-07-17 NOTE — Progress Notes (Signed)
Patient ID: Brandon [name]                 DOB: 03-Mar-1951                      MRN: [id number]     HPI:  Brandon [name] is a 66 y.o. male patient of Dr Debara Pickett referred to HTN clinic. PMH includes mild thoracic aortic aneurysm, and hypertension. He also has a history of throat cancer, for which he received chemo and radiation.  He still notes some neuropathy, and takes gabapentin as needed.      Patient presents today for HTN follow up. Denies headaches, dizziness, swelling, or fatigue. His wife still worried about elevated diastolic BP early in the morning.    Blood Pressure Goal:  130/80  Current Medications:             Carvedilol 6.25 mg twice daily             Amlodipine 10 mg daily (pm)             Valsartan 160 mg daily (am)              Family Hx:             Father and sister with hypertension  Social Hx:             No tobacco for> 10 years; endorses 3 bourbon/coke most days of the week; drinks 1 cup of coffee each morning.    Diet:             Eats home cooked about 1/2 meals, otherwise sit-down restaurants.  Rarely eats fried foods and does not add salt to his meals.  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 Allergy    mild  . Aortic aneurysm (HCC)    small and watching  . GERD (gastroesophageal  reflux disease)   . Head and neck cancer    head and neck ca dx 03/2009  . Hyperlipidemia   . Hypertension   . oropharyngeal ca dx'd 03/2009   chemo/xrt comp 06/2009  . Thyroid disease     Current Outpatient Prescriptions on File Prior to Visit  Medication Sig Dispense Refill  . amLODipine (NORVASC) 10 MG tablet TAKE 1 TABLET DAILY 90 tablet 3  . aspirin 81 MG tablet Take 81 mg by mouth daily.    . carvedilol (COREG) 6.25 MG tablet TAKE 1 TABLET TWICE A DAY WITH MEALS 180 tablet 0  . gabapentin (NEURONTIN) 100 MG capsule Take 3 capsules (300 mg total) by mouth at bedtime. 270 capsule 3  . levothyroxine (SYNTHROID, LEVOTHROID) 75 MCG tablet TAKE 1 TABLET DAILY 90  tablet 2  . omeprazole (PRILOSEC) 40 MG capsule TAKE 1 CAPSULE DAILY 90 capsule 3  . rosuvastatin (CRESTOR) 20 MG tablet Take 1 tablet (20 mg total) by mouth daily. 90 tablet 3  . valsartan (DIOVAN) 160 MG tablet Take 1 tablet (160 mg total) by mouth daily. 90 tablet 0  . zolpidem (AMBIEN) 10 MG tablet Take 1 tablet (10 mg total) by mouth at bedtime as needed. 90 tablet 1   No current facility-administered medications on file prior to visit.     Allergies  Allergen Reactions  . Benicar [Olmesartan]     fatigue    Blood pressure 136/84, pulse 60, SpO2 98 %.  Essential hypertension: Blood pressure at goal during office visit, but noted all morning readings are elevated prior to medication administration.  Average morning home readings is 131/93 and show an elevated diastolic BP. Also noted his blood pressure goes down to and average of 118/80 in the evenings. Will try moving valsartan administration to the evenings together with amlodipine and continue close monitoring.  No other medication adjustment today.  Patient to follow up with HTN clinic as needed.  Eudora Guevarra Rodriguez-Guzman PharmD, [place] [id number] 07/18/2016 10:35 AM

## 2016-07-18 ENCOUNTER — Ambulatory Visit (INDEPENDENT_AMBULATORY_CARE_PROVIDER_SITE_OTHER): Payer: 59 | Admitting: Pharmacist

## 2016-07-18 VITALS — BP 136/84 | HR 60

## 2016-07-18 DIAGNOSIS — I10 Essential (primary) hypertension: Secondary | ICD-10-CM | POA: Diagnosis not present

## 2016-07-18 NOTE — Patient Instructions (Addendum)
Return for a  follow up appointment as needed  Your blood pressure today is 136/84 pulse 60   Check your blood pressure at home daily (if able) and keep record of the readings.  Take your BP meds as follows:     Carvedilol 6.25 mg twice daily     Amlodipine 10 mg daily (pm)     Valsartan 160 mg daily  (trial 2-3 days taking in the evening; okay to resume morning dose if morning blood pressure not changed)  Bring all of your meds, your BP cuff and your record of home blood pressures to your next appointment.  Exercise as you're able, try to walk approximately 30 minutes per day.  Keep salt intake to a minimum, especially watch canned and prepared boxed foods.  Eat more fresh fruits and vegetables and fewer canned items.  Avoid eating in fast food restaurants.    HOW TO TAKE YOUR BLOOD PRESSURE: . Rest 5 minutes before taking your blood pressure. .  Don't smoke or drink caffeinated beverages for at least 30 minutes before. . Take your blood pressure before (not after) you eat. . Sit comfortably with your back supported and both feet on the floor (don't cross your legs). . Elevate your arm to heart level on a table or a desk. . Use the proper sized cuff. It should fit smoothly and snugly around your bare upper arm. There should be enough room to slip a fingertip under the cuff. The bottom edge of the cuff should be 1 inch above the crease of the elbow. . Ideally, take 3 measurements at one sitting and record the average.

## 2016-08-19 LAB — HEPATITIS C ANTIBODY (EXT): HEPATITIS C ANTIBODY (EXT): NEGATIVE

## 2016-09-13 ENCOUNTER — Ambulatory Visit: Admit: 2016-09-13 | Payer: 59

## 2016-09-13 ENCOUNTER — Ambulatory Visit

## 2016-09-13 DIAGNOSIS — G40909 Epilepsy, unspecified, not intractable, without status epilepticus: Principal | ICD-10-CM

## 2016-09-13 DIAGNOSIS — ? ASYMPTOMATIC VARICOS VNS: Secondary | ICD-10-CM
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
## 2016-09-20 ENCOUNTER — Encounter: Payer: Self-pay | Admitting: Internal Medicine

## 2016-09-20 ENCOUNTER — Other Ambulatory Visit: Payer: Self-pay | Admitting: Internal Medicine

## 2016-09-20 DIAGNOSIS — F5101 Primary insomnia: Secondary | ICD-10-CM

## 2016-09-20 MED ORDER — ZOLPIDEM TARTRATE 10 MG PO TABS: 10.0000 mg | ORAL_TABLET | Freq: Every evening | ORAL | 1 refills | Status: DC | PRN

## 2016-09-28 ENCOUNTER — Telehealth: Payer: PRIVATE HEALTH INSURANCE | Admitting: Family

## 2016-09-28 DIAGNOSIS — J028 Acute pharyngitis due to other specified organisms: Secondary | ICD-10-CM

## 2016-09-28 DIAGNOSIS — B9689 Other specified bacterial agents as the cause of diseases classified elsewhere: Secondary | ICD-10-CM

## 2016-09-28 MED ORDER — BENZONATATE 100 MG PO CAPS
100.0000 mg | ORAL_CAPSULE | Freq: Three times a day (TID) | ORAL | 0 refills | Status: DC | PRN
Start: 2016-09-28 — End: 2016-12-25

## 2016-09-28 MED ORDER — AZITHROMYCIN 250 MG PO TABS: ORAL_TABLET | ORAL | 0 refills | Status: DC

## 2016-09-28 NOTE — Progress Notes (Signed)
We are sorry that you are not feeling well.  Here is how we plan to help!  Based on what you have shared with me it looks like you have upper respiratory tract inflammation that has resulted in a significant cough.  Inflammation and infection in the upper respiratory tract is commonly called bronchitis and has four common causes:  Allergies, Viral Infections, Acid Reflux and Bacterial Infections.  Allergies, viruses and acid reflux are treated by controlling symptoms or eliminating the cause. An example might be a cough caused by taking certain blood pressure medications. You stop the cough by changing the medication. Another example might be a cough caused by acid reflux. Controlling the reflux helps control the cough.  Based on your presentation I believe you most likely have A cough due to bacteria.  When patients have a fever and a productive cough with a change in color or increased sputum production, we are concerned about bacterial bronchitis.  If left untreated it can progress to pneumonia.  If your symptoms do not improve with your treatment plan it is important that you contact your provider.   I have prescribed Azithromyin 250 mg: two tables now and then one tablet daily for 4 additonal days    In addition you may use A non-prescription cough medication called Mucinex DM: take 2 tablets every 12 hours. and A prescription cough medication called Tessalon Perles 100mg. You may take 1-2 capsules every 8 hours as needed for your cough.   USE OF BRONCHODILATOR ("RESCUE") INHALERS: There is a risk from using your bronchodilator too frequently.  The risk is that over-reliance on a medication which only relaxes the muscles surrounding the breathing tubes can reduce the effectiveness of medications prescribed to reduce swelling and congestion of the tubes themselves.  Although you feel brief relief from the bronchodilator inhaler, your asthma may actually be worsening with the tubes becoming more swollen  and filled with mucus.  This can delay other crucial treatments, such as oral steroid medications. If you need to use a bronchodilator inhaler daily, several times per day, you should discuss this with your provider.  There are probably better treatments that could be used to keep your asthma under control.     HOME CARE . Only take medications as instructed by your medical team. . Complete the entire course of an antibiotic. . Drink plenty of fluids and get plenty of rest. . Avoid close contacts especially the very young and the elderly . Cover your mouth if you cough or cough into your sleeve. . Always remember to wash your hands . A steam or ultrasonic humidifier can help congestion.   GET HELP RIGHT AWAY IF: . You develop worsening fever. . You become short of breath . You cough up blood. . Your symptoms persist after you have completed your treatment plan MAKE SURE YOU   Understand these instructions.  Will watch your condition.  Will get help right away if you are not doing well or get worse.  Your e-visit answers were reviewed by a board certified advanced clinical practitioner to complete your personal care plan.  Depending on the condition, your plan could have included both over the counter or prescription medications. If there is a problem please reply  once you have received a response from your provider. Your safety is important to us.  If you have drug allergies check your prescription carefully.    You can use MyChart to ask questions about today's visit, request a non-urgent   call back, or ask for a work or school excuse for 24 hours related to this e-Visit. If it has been greater than 24 hours you will need to follow up with your provider, or enter a new e-Visit to address those concerns. You will get an e-mail in the next two days asking about your experience.  I hope that your e-visit has been valuable and will speed your recovery. Thank you for using e-visits.   

## 2016-10-15 ENCOUNTER — Other Ambulatory Visit: Payer: Self-pay | Admitting: Internal Medicine

## 2016-10-15 DIAGNOSIS — I712 Thoracic aortic aneurysm, without rupture, unspecified: Secondary | ICD-10-CM

## 2016-10-15 DIAGNOSIS — I10 Essential (primary) hypertension: Secondary | ICD-10-CM

## 2016-10-18 ENCOUNTER — Ambulatory Visit (HOSPITAL_BASED_OUTPATIENT_CLINIC_OR_DEPARTMENT_OTHER)

## 2016-11-24 ENCOUNTER — Ambulatory Visit

## 2016-12-25 ENCOUNTER — Encounter: Payer: Self-pay | Admitting: Internal Medicine

## 2016-12-25 ENCOUNTER — Other Ambulatory Visit (INDEPENDENT_AMBULATORY_CARE_PROVIDER_SITE_OTHER): Payer: 59

## 2016-12-25 ENCOUNTER — Ambulatory Visit (INDEPENDENT_AMBULATORY_CARE_PROVIDER_SITE_OTHER): Payer: 59 | Admitting: Internal Medicine

## 2016-12-25 VITALS — BP 110/80 | HR 59 | Temp 98.3°F | Ht 70.0 in | Wt 185.0 lb

## 2016-12-25 DIAGNOSIS — E039 Hypothyroidism, unspecified: Secondary | ICD-10-CM

## 2016-12-25 DIAGNOSIS — I10 Essential (primary) hypertension: Secondary | ICD-10-CM

## 2016-12-25 DIAGNOSIS — R600 Localized edema: Secondary | ICD-10-CM | POA: Diagnosis not present

## 2016-12-25 DIAGNOSIS — E876 Hypokalemia: Secondary | ICD-10-CM | POA: Diagnosis not present

## 2016-12-25 LAB — CBC WITH DIFFERENTIAL/PLATELET
BASOS ABS: 0.1 10*3/uL (ref 0.0–0.1)
Basophils Relative: 1 % (ref 0.0–3.0)
Eosinophils Absolute: 0.2 10*3/uL (ref 0.0–0.7)
Eosinophils Relative: 2.4 % (ref 0.0–5.0)
HEMATOCRIT: 41.9 % (ref 39.0–52.0)
HEMOGLOBIN: 14.6 g/dL (ref 13.0–17.0)
LYMPHS PCT: 19.3 % (ref 12.0–46.0)
Lymphs Abs: 1.3 10*3/uL (ref 0.7–4.0)
MCHC: 34.8 g/dL (ref 30.0–36.0)
MCV: 92.4 fl (ref 78.0–100.0)
MONOS PCT: 9.6 % (ref 3.0–12.0)
Monocytes Absolute: 0.6 10*3/uL (ref 0.1–1.0)
NEUTROS ABS: 4.4 10*3/uL (ref 1.4–7.7)
Neutrophils Relative %: 67.7 % (ref 43.0–77.0)
PLATELETS: 316 10*3/uL (ref 150.0–400.0)
RBC: 4.53 Mil/uL (ref 4.22–5.81)
RDW: 14.6 % (ref 11.5–15.5)
WBC: 6.5 10*3/uL (ref 4.0–10.5)

## 2016-12-25 LAB — URINALYSIS, ROUTINE W REFLEX MICROSCOPIC
HGB URINE DIPSTICK: NEGATIVE
KETONES UR: NEGATIVE
LEUKOCYTES UA: NEGATIVE
NITRITE: NEGATIVE
RBC / HPF: NONE SEEN (ref 0–?)
Specific Gravity, Urine: 1.025 (ref 1.000–1.030)
TOTAL PROTEIN, URINE-UPE24: NEGATIVE
URINE GLUCOSE: NEGATIVE
UROBILINOGEN UA: 0.2 (ref 0.0–1.0)
pH: 6 (ref 5.0–8.0)

## 2016-12-25 LAB — COMPREHENSIVE METABOLIC PANEL
ALBUMIN: 4.1 g/dL (ref 3.5–5.2)
ALK PHOS: 65 U/L (ref 39–117)
ALT: 17 U/L (ref 0–53)
AST: 29 U/L (ref 0–37)
BILIRUBIN TOTAL: 0.6 mg/dL (ref 0.2–1.2)
BUN: 18 mg/dL (ref 6–23)
CALCIUM: 9 mg/dL (ref 8.4–10.5)
CO2: 27 meq/L (ref 19–32)
Chloride: 99 mEq/L (ref 96–112)
Creatinine, Ser: 0.87 mg/dL (ref 0.40–1.50)
GFR: 93.69 mL/min (ref >60.00)
Glucose, Bld: 104 mg/dL — ABNORMAL HIGH (ref 70–99)
Potassium: 3.2 mEq/L — ABNORMAL LOW (ref 3.5–5.1)
Sodium: 137 mEq/L (ref 135–145)
Total Protein: 7.2 g/dL (ref 6.0–8.3)

## 2016-12-25 LAB — BRAIN NATRIURETIC PEPTIDE: PRO B NATRI PEPTIDE: 76 pg/mL (ref 0.0–100.0)

## 2016-12-25 LAB — TSH: TSH: 12.44 u[IU]/mL — ABNORMAL HIGH (ref 0.35–4.50)

## 2016-12-25 MED ORDER — POTASSIUM CHLORIDE CRYS ER 15 MEQ PO TBCR: 15.0000 meq | EXTENDED_RELEASE_TABLET | Freq: Two times a day (BID) | ORAL | 2 refills | Status: DC

## 2016-12-25 MED ORDER — LEVOTHYROXINE SODIUM 100 MCG PO TABS: 100.0000 ug | ORAL_TABLET | Freq: Every day | ORAL | 0 refills | Status: DC

## 2016-12-25 NOTE — Patient Instructions (Signed)

## 2016-12-25 NOTE — Progress Notes (Signed)
Subjective:  Patient ID: Brandon [name], male    DOB: 25-May-1950  Age: 66 y.o. MRN: [id number]  CC: Hypertension and Hypothyroidism   HPI Brandon [name] presents for f/up - he complains of swelling over his feet and ankles for several weeks.his SBP at home has been as low as 100.  Outpatient Medications Prior to Visit  Medication Sig Dispense Refill  . aspirin 81 MG tablet Take 81 mg by mouth daily.    . carvedilol (COREG) 6.25 MG tablet TAKE 1 TABLET TWICE A DAY WITH MEALS 180 tablet 0  . gabapentin (NEURONTIN) 100 MG capsule Take 3 capsules (300 mg total) by mouth at bedtime. 270 capsule 3  . omeprazole (PRILOSEC) 40 MG capsule TAKE 1 CAPSULE DAILY 90 capsule 3  . rosuvastatin (CRESTOR) 20 MG tablet Take 1 tablet (20 mg total) by mouth daily. 90 tablet 3  . zolpidem (AMBIEN) 10 MG tablet Take 1 tablet (10 mg total) by mouth at bedtime as needed. 90 tablet 1  . amLODipine (NORVASC) 10 MG tablet TAKE 1 TABLET DAILY 90 tablet 3  . levothyroxine (SYNTHROID, LEVOTHROID) 75 MCG tablet TAKE 1 TABLET DAILY 90 tablet 2  . valsartan (DIOVAN) 160 MG tablet Take 1 tablet (160 mg total) by mouth daily. 90 tablet 0  . azithromycin (ZITHROMAX) 250 MG tablet Take 2 tabs now then 1 daily times 4 days 6 tablet 0  . benzonatate (TESSALON PERLES) 100 MG capsule Take 1-2 capsules (100-200 mg total) by mouth every 8 (eight) hours as needed for cough. 30 capsule 0   No facility-administered medications prior to visit.     ROS Review of Systems  Constitutional: Negative.  Negative for appetite change, chills, diaphoresis, fatigue and fever.  HENT: Negative.  Negative for trouble swallowing.   Eyes: Negative.  Negative for visual disturbance.  Respiratory: Negative.  Negative for cough, chest tightness, shortness of breath and wheezing.   Cardiovascular: Positive for leg swelling. Negative for chest pain and palpitations.  Gastrointestinal: Negative for abdominal pain, constipation, diarrhea, nausea and  vomiting.  Endocrine: Negative.   Genitourinary: Negative.  Negative for difficulty urinating, dysuria, frequency and hematuria.  Musculoskeletal: Negative.  Negative for back pain and myalgias.  Skin: Negative.   Allergic/Immunologic: Negative.   Neurological: Negative.  Negative for dizziness, weakness, light-headedness and headaches.  Hematological: Negative for adenopathy. Does not bruise/bleed easily.  Psychiatric/Behavioral: Negative.     Objective:  BP 110/80 (BP Location: Left Arm, Patient Position: Sitting, Cuff Size: Normal)   Pulse (!) 59   Temp 98.3 F (36.8 C) (Oral)   Ht 5\' 10"  (1.778 m)   Wt 185 lb (83.9 kg)   SpO2 98%   BMI 26.54 kg/m   BP Readings from Last 3 Encounters:  12/25/16 110/80  07/18/16 136/84  05/29/16 130/82    Wt Readings from Last 3 Encounters:  12/25/16 185 lb (83.9 kg)  03/22/16 185 lb (83.9 kg)  01/19/16 180 lb 8 oz (81.9 kg)    Physical Exam  Constitutional: He is oriented to person, place, and time. No distress.  HENT:  Mouth/Throat: Oropharynx is clear and moist. No oropharyngeal exudate.  Eyes: Conjunctivae are normal. Right eye exhibits no discharge. Left eye exhibits no discharge. No scleral icterus.  Neck: Normal range of motion. Neck supple. No JVD present. No thyromegaly present.  Cardiovascular: Normal rate, regular rhythm and intact distal pulses.  Exam reveals no gallop and no friction rub.   No murmur heard. EKG -  Sinus  Bradycardia  WITHIN NORMAL LIMITS- no change from the prior EKG   Pulmonary/Chest: Effort normal and breath sounds normal. No respiratory distress. He has no wheezes. He has no rales. He exhibits no tenderness.  Abdominal: Soft. Bowel sounds are normal. He exhibits no distension and no mass. There is no tenderness. There is no rebound and no guarding.  Musculoskeletal: Normal range of motion. He exhibits edema. He exhibits no tenderness or deformity.  Trace pitting edema over both ankles and feet    Lymphadenopathy:    He has no cervical adenopathy.  Neurological: He is alert and oriented to person, place, and time.  Skin: Skin is warm and dry. No rash noted. He is not diaphoretic. No erythema. No pallor.  Vitals reviewed.   Lab Results  Component Value Date   WBC 6.5 12/25/2016   HGB 14.6 12/25/2016   HCT 41.9 12/25/2016   PLT 316.0 12/25/2016   GLUCOSE 104 (H) 12/25/2016   CHOL 181 01/19/2016   TRIG 58.0 01/19/2016   HDL 76.80 01/19/2016   LDLDIRECT 147.7 12/03/2007   LDLCALC 93 01/19/2016   ALT 17 12/25/2016   AST 29 12/25/2016   NA 137 12/25/2016   K 3.2 (L) 12/25/2016   CL 99 12/25/2016   CREATININE 0.87 12/25/2016   BUN 18 12/25/2016   CO2 27 12/25/2016   TSH 12.44 (H) 12/25/2016   PSA 0.64 09/09/2014   INR 1.0 09/01/2008   HGBA1C 5.5 07/30/2013    Ct Angio Chest Aorta W &/or Wo Contrast  Result Date: 03/09/2016 CLINICAL DATA:  Thoracic aortic aneurysm without rupture. EXAM: CT ANGIOGRAPHY CHEST WITH CONTRAST TECHNIQUE: Multidetector CT imaging of the chest was performed using the standard protocol during bolus administration of intravenous contrast. Multiplanar CT image reconstructions and MIPs were obtained to evaluate the vascular anatomy. CONTRAST:  100 mL of Isovue 370 intravenously. COMPARISON:  CT scan of March 10, 2015. FINDINGS: Cardiovascular: Atherosclerosis of thoracic aorta is noted without dissection. Great vessels are widely patent without stenosis. 4.0 cm ascending thoracic aortic aneurysm is noted which is stable compared to prior exam. Mediastinum/Nodes: Stable enlarged mediastinal lymph nodes are noted compared to prior exam most likely reactive in etiology. Lungs/Pleura: Stable biapical scarring. No pneumothorax or pleural effusion is noted. No acute pulmonary disease is noted. Upper Abdomen: 9.2 cm simple cyst is seen arising from upper pole of left kidney. No other abnormality seen in visualized portion of upper abdomen. Musculoskeletal:  Ossification of anterior longitudinal ligament is noted. No other significant osseous abnormality is noted. Review of the MIP images confirms the above findings. IMPRESSION: Stable 4.0 cm ascending thoracic aortic aneurysm. Recommend annual imaging followup by CTA or MRA. This recommendation follows 2010 ACCF/AHA/AATS/ACR/ASA/SCA/SCAI/SIR/STS/SVM Guidelines for the Diagnosis and Management of Patients with Thoracic Aortic Disease. Circulation. 2010; 121: K270-W237. Electronically Signed   By: Marijo Conception, M.D.   On: 03/09/2016 09:52    Assessment & Plan:   Doyne was seen today for hypertension and hypothyroidism.  Diagnoses and all orders for this visit:  Localized edema- His EKG is negative for LVH or ischemia. His BNP is normal. This rules out cardiac causes. His urine is negative for protein and his renal function is normal. This rules out renal causes and nephrotic syndrome. His TSH is elevated above 12 so this is likely a factor. He also takes amlodipine which could be a causative factor. I think his edema is benign. Will increase his T4 dose and will discontinue the amlodipine. -  Comprehensive metabolic panel; Future -     Urinalysis, Routine w reflex microscopic; Future -     Brain natriuretic peptide; Future -     EKG 12-Lead  Essential hypertension, benign- his blood pressure is over controlled. Will discontinue the amlodipine and the ARB. -     Comprehensive metabolic panel; Future -     CBC with Differential/Platelet; Future -     EKG 12-Lead  Acquired hypothyroidism- his TSH is elevated at 12.44. Will increase his levothyroxine dose to 100 g a day. -     TSH; Future -     levothyroxine (SYNTHROID, LEVOTHROID) 100 MCG tablet; Take 1 tablet (100 mcg total) by mouth daily.  Hypokalemia- he does not take any medications like diuretics that would lower his potassium level. This must be related to poor oral intake. Will start potassium replacement therapy. -     potassium  chloride SA (KLOR-CON M15) 15 MEQ tablet; Take 1 tablet (15 mEq total) by mouth 2 (two) times daily.   I have discontinued Mr. Runnion's amLODipine, levothyroxine, valsartan, benzonatate, and azithromycin. I am also having him start on levothyroxine and potassium chloride SA. Additionally, I am having him maintain his gabapentin, aspirin, rosuvastatin, omeprazole, zolpidem, and carvedilol.  Meds ordered this encounter  Medications  . levothyroxine (SYNTHROID, LEVOTHROID) 100 MCG tablet    Sig: Take 1 tablet (100 mcg total) by mouth daily.    Dispense:  90 tablet    Refill:  0  . potassium chloride SA (KLOR-CON M15) 15 MEQ tablet    Sig: Take 1 tablet (15 mEq total) by mouth 2 (two) times daily.    Dispense:  60 tablet    Refill:  2     Follow-up: Return in about 2 months (around 02/24/2017).  Scarlette Calico, MD

## 2017-01-13 ENCOUNTER — Other Ambulatory Visit: Payer: Self-pay | Admitting: Internal Medicine

## 2017-01-13 DIAGNOSIS — I712 Thoracic aortic aneurysm, without rupture, unspecified: Secondary | ICD-10-CM

## 2017-01-13 DIAGNOSIS — I10 Essential (primary) hypertension: Secondary | ICD-10-CM

## 2017-01-22 ENCOUNTER — Ambulatory Visit

## 2017-01-22 NOTE — Progress Notes (Signed)
* * *        **  Rosal, Nafis**    --- ---    87 Y old Male, DOB: Aug 16, 1950    [address] [place], [place] [address]    Home: [phone]    Provider: Floydene Flock        * * *    Telephone Encounter    ---    Answered by   Bernie Covey  Date: 01/22/2017         Time: 11:12 AM    Caller   Patient    --- ---            Reason   RX Refill            Message                      Good morning,            Patient is requesting that his medication: Dilantin 100 MG Capsule, be refilled and be sent to the verified pharmacy listed above.  Patients would like to be emailed [email or web address])or contacted at number: [phone] once the refill is ready.            Thank you.                Action Taken   Rose,Tanna 01/22/2017 11:14:15 AM > Wood,Mary 01/24/2017  11:10:32 AM > this was sent to pharmacy 10/17                * * *                ---          * * *          Patient: Crossin, Isaiyah DOB: 09-Jan-1951 Provider: Floydene Flock 01/22/2017    ---    Note generated by eClinicalWorks EMR/PM Software ([email or web address])

## 2017-01-24 ENCOUNTER — Ambulatory Visit: Admitting: Neurology

## 2017-01-24 MED ORDER — Dilantin: 100 | Capsule | 3 refills | 0 days | Status: AC

## 2017-01-24 NOTE — Progress Notes (Signed)
* * *        **  [name], [name] [name]**    --- ---    37 Y old Male, DOB: [birth year]    [address], [place], [place] [address]    Home: [phone]    Provider: [name], MD        * * *    Telephone Encounter    ---    Answered by   [name]  Date: 01/24/2017         Time: 09:22 AM    Refills  Refill Dilantin Capsule, 100 mg, Orally- NAME BRAND DAW MEDICALLY  NECESSARY, 360 Capsule, TAKE 4 CAPSULES BY MOUTH EVERY MORNING WITH THE 30MG ,  90 days, Refills=3    --- ---          * * *                ---          * * *          Patient: [name] [name], [name] [name] DOB: 1951/01/02 Provider: [name], MD 01/24/2017    ---    Note generated by eClinicalWorks EMR/PM Software ([email or web address])

## 2017-02-07 ENCOUNTER — Other Ambulatory Visit: Payer: Self-pay | Admitting: Internal Medicine

## 2017-02-07 DIAGNOSIS — I712 Thoracic aortic aneurysm, without rupture, unspecified: Secondary | ICD-10-CM

## 2017-02-07 DIAGNOSIS — E785 Hyperlipidemia, unspecified: Secondary | ICD-10-CM

## 2017-02-20 ENCOUNTER — Encounter: Payer: Self-pay | Admitting: Internal Medicine

## 2017-02-20 ENCOUNTER — Other Ambulatory Visit (INDEPENDENT_AMBULATORY_CARE_PROVIDER_SITE_OTHER): Payer: 59

## 2017-02-20 ENCOUNTER — Ambulatory Visit (INDEPENDENT_AMBULATORY_CARE_PROVIDER_SITE_OTHER): Payer: 59 | Admitting: Internal Medicine

## 2017-02-20 VITALS — BP 180/120 | HR 52 | Temp 98.1°F | Resp 16 | Ht 70.0 in | Wt 183.0 lb

## 2017-02-20 DIAGNOSIS — E876 Hypokalemia: Secondary | ICD-10-CM | POA: Diagnosis not present

## 2017-02-20 DIAGNOSIS — R7309 Other abnormal glucose: Secondary | ICD-10-CM | POA: Diagnosis not present

## 2017-02-20 DIAGNOSIS — E039 Hypothyroidism, unspecified: Secondary | ICD-10-CM

## 2017-02-20 DIAGNOSIS — E785 Hyperlipidemia, unspecified: Secondary | ICD-10-CM | POA: Diagnosis not present

## 2017-02-20 DIAGNOSIS — I10 Essential (primary) hypertension: Secondary | ICD-10-CM

## 2017-02-20 DIAGNOSIS — Z0000 Encounter for general adult medical examination without abnormal findings: Secondary | ICD-10-CM

## 2017-02-20 LAB — TSH: TSH: 2.23 u[IU]/mL (ref 0.35–4.50)

## 2017-02-20 LAB — BASIC METABOLIC PANEL
BUN: 20 mg/dL (ref 6–23)
CO2: 30 meq/L (ref 19–32)
Calcium: 9.5 mg/dL (ref 8.4–10.5)
Chloride: 101 mEq/L (ref 96–112)
Creatinine, Ser: 0.91 mg/dL (ref 0.40–1.50)
GFR: 88.91 mL/min (ref >60.00)
GLUCOSE: 108 mg/dL — AB (ref 70–99)
POTASSIUM: 3.9 meq/L (ref 3.5–5.1)
SODIUM: 140 meq/L (ref 135–145)

## 2017-02-20 LAB — LIPID PANEL
CHOL/HDL RATIO: 2
Cholesterol: 170 mg/dL (ref 0–200)
HDL: 82.7 mg/dL (ref >39.00)
LDL Cholesterol: 75 mg/dL (ref 0–99)
NonHDL: 87.39
TRIGLYCERIDES: 62 mg/dL (ref 0.0–149.0)
VLDL: 12.4 mg/dL (ref 0.0–40.0)

## 2017-02-20 LAB — MAGNESIUM: Magnesium: 1.1 mg/dL — ABNORMAL LOW (ref 1.5–2.5)

## 2017-02-20 LAB — HEMOGLOBIN A1C: HEMOGLOBIN A1C: 5.5 % (ref 4.6–6.5)

## 2017-02-20 MED ORDER — SPIRONOLACTONE 25 MG PO TABS: 25.0000 mg | ORAL_TABLET | Freq: Every day | ORAL | 0 refills | Status: DC

## 2017-02-20 MED ORDER — LEVOTHYROXINE SODIUM 100 MCG PO TABS: 100.0000 ug | ORAL_TABLET | Freq: Every day | ORAL | 1 refills | Status: DC

## 2017-02-20 MED ORDER — MAGNESIUM OXIDE -MG SUPPLEMENT 400 (240 MG) MG PO TABS: 1.0000 | ORAL_TABLET | Freq: Every day | ORAL | 1 refills | Status: DC

## 2017-02-20 NOTE — Patient Instructions (Signed)

## 2017-02-20 NOTE — Progress Notes (Signed)
Subjective:  Patient ID: Brandon [name], male    DOB: 04/25/1950  Age: 66 y.o. MRN: [id number]  CC: Hypertension; Hypothyroidism; and Hyperlipidemia   HPI Brandon [name] presents for f/up - he complains of the potassium tablets are big and bulky and hard to swallow.  He thinks his blood pressure has been well controlled.  Outpatient Medications Prior to Visit  Medication Sig Dispense Refill  . aspirin 81 MG tablet Take 81 mg by mouth daily.    . carvedilol (COREG) 6.25 MG tablet Take 1 tablet (6.25 mg total) by mouth 2 (two) times daily with a meal. 180 tablet 3  . gabapentin (NEURONTIN) 100 MG capsule Take 3 capsules (300 mg total) by mouth at bedtime. 270 capsule 3  . omeprazole (PRILOSEC) 40 MG capsule TAKE 1 CAPSULE DAILY 90 capsule 1  . rosuvastatin (CRESTOR) 20 MG tablet TAKE 1 TABLET DAILY 90 tablet 1  . zolpidem (AMBIEN) 10 MG tablet Take 1 tablet (10 mg total) by mouth at bedtime as needed. 90 tablet 1  . levothyroxine (SYNTHROID, LEVOTHROID) 100 MCG tablet Take 1 tablet (100 mcg total) by mouth daily. 90 tablet 0  . potassium chloride SA (KLOR-CON M15) 15 MEQ tablet Take 1 tablet (15 mEq total) by mouth 2 (two) times daily. 60 tablet 2   No facility-administered medications prior to visit.     ROS Review of Systems  Constitutional: Negative.  Negative for appetite change, diaphoresis, fatigue and unexpected weight change.  HENT: Negative.  Negative for sore throat and trouble swallowing.   Eyes: Negative for visual disturbance.  Respiratory: Negative for cough, chest tightness, shortness of breath and wheezing.   Cardiovascular: Negative.  Negative for chest pain, palpitations and leg swelling.  Gastrointestinal: Negative for abdominal pain, diarrhea, nausea and vomiting.  Endocrine: Negative for cold intolerance and heat intolerance.  Genitourinary: Negative.  Negative for difficulty urinating, dysuria, hematuria and urgency.  Musculoskeletal: Negative.  Negative for  back pain and myalgias.  Allergic/Immunologic: Negative.   Neurological: Negative.  Negative for dizziness, weakness, light-headedness and headaches.  Hematological: Negative for adenopathy. Does not bruise/bleed easily.  Psychiatric/Behavioral: Negative.  Negative for dysphoric mood. The patient is not nervous/anxious.     Objective:  BP (!) 180/120 (BP Location: Left Arm, Patient Position: Sitting, Cuff Size: Normal)   Pulse (!) 52   Temp 98.1 F (36.7 C) (Oral)   Resp 16   Ht 5\' 10"  (1.778 m)   Wt 183 lb (83 kg)   SpO2 98%   BMI 26.26 kg/m   BP Readings from Last 3 Encounters:  02/20/17 (!) 180/120  12/25/16 110/80  07/18/16 136/84    Wt Readings from Last 3 Encounters:  02/20/17 183 lb (83 kg)  12/25/16 185 lb (83.9 kg)  03/22/16 185 lb (83.9 kg)    Physical Exam  Constitutional: He is oriented to person, place, and time. No distress.  HENT:  Mouth/Throat: Oropharynx is clear and moist. No oropharyngeal exudate.  Eyes: Conjunctivae are normal. Right eye exhibits no discharge. Left eye exhibits no discharge. No scleral icterus.  Neck: Normal range of motion. Neck supple. No JVD present. No thyromegaly present.  Cardiovascular: Normal rate, regular rhythm and intact distal pulses. Exam reveals no gallop.  No murmur heard. Pulmonary/Chest: Effort normal and breath sounds normal. No respiratory distress. He has no wheezes. He has no rales. He exhibits no tenderness.  Abdominal: Soft. Bowel sounds are normal. He exhibits no distension and no mass. There is no tenderness.  There is no rebound and no guarding.  Musculoskeletal: Normal range of motion. He exhibits no edema, tenderness or deformity.  Lymphadenopathy:    He has no cervical adenopathy.  Neurological: He is alert and oriented to person, place, and time.  Skin: Skin is warm and dry. No rash noted. He is not diaphoretic. No erythema. No pallor.  Vitals reviewed.   Lab Results  Component Value Date   WBC 6.5  12/25/2016   HGB 14.6 12/25/2016   HCT 41.9 12/25/2016   PLT 316.0 12/25/2016   GLUCOSE 108 (H) 02/20/2017   CHOL 170 02/20/2017   TRIG 62.0 02/20/2017   HDL 82.70 02/20/2017   LDLDIRECT 147.7 12/03/2007   LDLCALC 75 02/20/2017   ALT 17 12/25/2016   AST 29 12/25/2016   NA 140 02/20/2017   K 3.9 02/20/2017   CL 101 02/20/2017   CREATININE 0.91 02/20/2017   BUN 20 02/20/2017   CO2 30 02/20/2017   TSH 2.23 02/20/2017   PSA 0.64 09/09/2014   INR 1.0 09/01/2008   HGBA1C 5.5 02/20/2017    Ct Angio Chest Aorta W &/or Wo Contrast  Result Date: 03/09/2016 CLINICAL DATA:  Thoracic aortic aneurysm without rupture. EXAM: CT ANGIOGRAPHY CHEST WITH CONTRAST TECHNIQUE: Multidetector CT imaging of the chest was performed using the standard protocol during bolus administration of intravenous contrast. Multiplanar CT image reconstructions and MIPs were obtained to evaluate the vascular anatomy. CONTRAST:  100 mL of Isovue 370 intravenously. COMPARISON:  CT scan of March 10, 2015. FINDINGS: Cardiovascular: Atherosclerosis of thoracic aorta is noted without dissection. Great vessels are widely patent without stenosis. 4.0 cm ascending thoracic aortic aneurysm is noted which is stable compared to prior exam. Mediastinum/Nodes: Stable enlarged mediastinal lymph nodes are noted compared to prior exam most likely reactive in etiology. Lungs/Pleura: Stable biapical scarring. No pneumothorax or pleural effusion is noted. No acute pulmonary disease is noted. Upper Abdomen: 9.2 cm simple cyst is seen arising from upper pole of left kidney. No other abnormality seen in visualized portion of upper abdomen. Musculoskeletal: Ossification of anterior longitudinal ligament is noted. No other significant osseous abnormality is noted. Review of the MIP images confirms the above findings. IMPRESSION: Stable 4.0 cm ascending thoracic aortic aneurysm. Recommend annual imaging followup by CTA or MRA. This recommendation  follows 2010 ACCF/AHA/AATS/ACR/ASA/SCA/SCAI/SIR/STS/SVM Guidelines for the Diagnosis and Management of Patients with Thoracic Aortic Disease. Circulation. 2010; 121: O160-V371. Electronically Signed   By: Marijo Conception, M.D.   On: 03/09/2016 09:52    Assessment & Plan:   Brandon [name] was seen today for hypertension, hypothyroidism and hyperlipidemia.  Diagnoses and all orders for this visit:  Hypokalemia- He wants to stop taking the potassium supplement.  His potassium several level is in the low normal range at 3.9.  His blood pressure is not well controlled so I have asked him to start taking a potassium sparing diuretic. -     spironolactone (ALDACTONE) 25 MG tablet; Take 1 tablet (25 mg total) daily by mouth. -     Basic metabolic panel; Future -     Magnesium; Future  Essential hypertension, benign- His blood pressure is not adequately well controlled on carvedilol alone.  He has a history of hypokalemia so I have asked him to start taking spironolactone. -     spironolactone (ALDACTONE) 25 MG tablet; Take 1 tablet (25 mg total) daily by mouth. -     Basic metabolic panel; Future  Hyperlipidemia with target LDL less than 130- He has  achieved his LDL goal and is doing well on the statin. -     Lipid panel; Future  Other abnormal glucose- His A1c is 5.5%.  There are no concerns related to this. -     Hemoglobin A1c; Future  Routine general medical examination at a health care facility  Acquired hypothyroidism- His TSH is in the normal range so I have asked him to remain on the current dose of levothyroxine. -     TSH; Future -     levothyroxine (SYNTHROID, LEVOTHROID) 100 MCG tablet; Take 1 tablet (100 mcg total) daily by mouth.  Hypomagnesemia- His magnesium level remains low.  I have asked him to start taking a magnesium supplement. -     Magnesium Oxide 400 (240 Mg) MG TABS; Take 1 tablet (400 mg total) daily by mouth.   I have discontinued Brandon Griffes. [name]'s potassium chloride SA.  I have also changed his levothyroxine. Additionally, I am having him start on spironolactone and Magnesium Oxide. Lastly, I am having him maintain his gabapentin, aspirin, zolpidem, carvedilol, omeprazole, and rosuvastatin.  Meds ordered this encounter  Medications  . spironolactone (ALDACTONE) 25 MG tablet    Sig: Take 1 tablet (25 mg total) daily by mouth.    Dispense:  90 tablet    Refill:  0  . levothyroxine (SYNTHROID, LEVOTHROID) 100 MCG tablet    Sig: Take 1 tablet (100 mcg total) daily by mouth.    Dispense:  90 tablet    Refill:  1  . Magnesium Oxide 400 (240 Mg) MG TABS    Sig: Take 1 tablet (400 mg total) daily by mouth.    Dispense:  90 tablet    Refill:  1     Follow-up: Return in about 2 months (around 04/22/2017).  Scarlette Calico, MD

## 2017-03-09 ENCOUNTER — Encounter: Payer: Self-pay | Admitting: Internal Medicine

## 2017-03-09 ENCOUNTER — Ambulatory Visit (INDEPENDENT_AMBULATORY_CARE_PROVIDER_SITE_OTHER)
Admission: RE | Admit: 2017-03-09 | Discharge: 2017-03-09 | Disposition: A | Discharge status: Home / Self Care | Payer: 59 | Source: Ambulatory Visit | Attending: Internal Medicine | Admitting: Internal Medicine

## 2017-03-09 DIAGNOSIS — I712 Thoracic aortic aneurysm, without rupture, unspecified: Secondary | ICD-10-CM

## 2017-03-09 MED ORDER — IOPAMIDOL (ISOVUE-370) INJECTION 76%
100.0000 mL | Freq: Once | INTRAVENOUS | Status: AC | PRN
Start: 2017-03-09 — End: 2017-03-09
  Administered 2017-03-09: 100 mL via INTRAVENOUS

## 2017-03-21 ENCOUNTER — Ambulatory Visit: Admit: 2017-03-21 | Payer: Medicare PPO

## 2017-03-21 ENCOUNTER — Ambulatory Visit: Admitting: Neurology

## 2017-03-21 DIAGNOSIS — ? PARKINSONS DISEASE: Secondary | ICD-10-CM

## 2017-03-21 DIAGNOSIS — G40909 Epilepsy, unspecified, not intractable, without status epilepticus: Principal | ICD-10-CM

## 2017-03-21 LAB — HX DIABETES: HX GLUCOSE: 119 mg/dL (ref 70–139)

## 2017-03-21 LAB — HX CHEM-PANELS
HX ANION GAP: 8 (ref 3–14)
HX BLOOD UREA NITROGEN: 17 mg/dL (ref 6–24)
HX CHLORIDE (CL): 104 meq/L (ref 98–110)
HX CO2: 29 meq/L (ref 20–30)
HX CREATININE (CR): 0.78 mg/dL (ref 0.57–1.30)
HX GFR, AFRICAN AMERICAN: 110 mL/min/{1.73_m2}
HX GFR, NON-AFRICAN AMERICAN: 95 mL/min/{1.73_m2}
HX GLUCOSE: 119 mg/dL (ref 70–139)
HX POTASSIUM (K): 4.3 meq/L (ref 3.6–5.1)
HX SODIUM (NA): 141 meq/L (ref 135–145)

## 2017-03-21 LAB — HX CHEM-LFT
HX ALANINE AMINOTRANSFERASE (ALT/SGPT): 63 IU/L — ABNORMAL HIGH (ref 0–54)
HX ALKALINE PHOSPHATASE (ALK): 163 IU/L — ABNORMAL HIGH (ref 40–130)
HX ASPARTATE AMINOTRANFERASE (AST/SGOT): 20 IU/L (ref 10–42)
HX BILI INDIRECT: 0.2 mg/dL (ref 0.1–0.7)
HX BILIRUBIN, DIRECT: 0.2 mg/dL (ref 0.0–0.3)
HX BILIRUBIN, TOTAL: 0.4 mg/dL (ref 0.2–1.1)
HX LACTATE DEHYDROGENASE (LDH): 192 IU/L (ref 120–220)

## 2017-03-21 LAB — HX HEM-ROUTINE
HX BASO #: 0.1 10*3/uL (ref 0.0–0.2)
HX BASO: 1 %
HX EOSIN #: 0.2 10*3/uL (ref 0.0–0.5)
HX EOSIN: 3 %
HX HCT: 46.6 % (ref 37.0–47.0)
HX HGB: 15.5 g/dL (ref 13.5–16.0)
HX IMMATURE GRANULOCYTE#: 0 10*3/uL (ref 0.0–0.1)
HX IMMATURE GRANULOCYTE: 0 %
HX LYMPH #: 1.4 10*3/uL (ref 1.0–4.0)
HX LYMPH: 22 %
HX MCH: 31.5 pg (ref 26.0–34.0)
HX MCHC: 33.3 g/dL (ref 32.0–36.0)
HX MCV: 94.7 fL (ref 80.0–98.0)
HX MONO #: 0.5 10*3/uL (ref 0.2–0.8)
HX MONO: 9 %
HX MPV: 11.1 fL (ref 9.1–11.7)
HX NEUT #: 4.2 10*3/uL (ref 1.5–7.5)
HX PLT: 253 10*3/uL (ref 150–400)
HX RBC BLOOD COUNT: 4.92 M/uL (ref 4.20–5.50)
HX RDW: 12.9 % (ref 11.5–14.5)
HX SEG NEUT: 66 %
HX WBC: 6.3 10*3/uL (ref 4.0–11.0)

## 2017-03-21 LAB — HX CHEM-OTHER
HX ALBUMIN: 4.4 g/dL (ref 3.4–4.8)
HX CALCIUM (CA): 9.8 mg/dL (ref 8.5–10.5)
HX PROTEIN, TOTAL: 7.5 g/dL (ref 6.0–8.3)

## 2017-03-21 LAB — HX TOXICOLOGY-TDM: HX PHENYTOIN (DILANTIN), TOTAL: 18.3 ug/mL (ref 10.0–20.0)

## 2017-03-21 NOTE — Progress Notes (Signed)
* * *        **  [name], [name]**    --- ---    65 Y old Male, DOB: 1950-08-15    [address], [place], [place] [address]    Home: [phone]    Provider: [name], MD        * * *    Telephone Encounter    ---    Answered by   [name]  Date: 03/21/2017         Time: 02:59 PM    Action Taken   [name] , MD 03/21/2017 2:59:47 PM > pls let him know-  bloodwork - excellent dilantin in 18 range, has some non specifically elevated  liver functions/ not serious/ would like to send/ please send all his labs to  the PCP Dr Marin Shutter / happy to re-evaluate him in 6 months and or if/as needed  JO [name],[name] 03/22/2017 10:46:13 AM > all set let patient know/ thanks  Dez    --- ---                * * *                ---          * * *          Patient: [name], [name] DOB: Jun 16, 1950 Provider: [name], MD 03/21/2017    ---    Note generated by eClinicalWorks EMR/PM Software ([email or web address])

## 2017-03-21 NOTE — Progress Notes (Signed)
* * *        **[name] [name], [name] [name]**    --- ---    60 Y old Male, DOB: [birth year], External MRN: [id number]    Account Number: [id number]    [address], [place], [id number]    Home: [phone]    Insurance: [id number] [employer] PPO    PCP: [name] Grout, MD Referring: [name] Grout, MD    Appointment Facility: Neurology        * * *    03/21/2017  Progress Notes: [name] MoccasinCHN#:** [id number]    --- ---    ---        Reason for Appointment    ---      1\. F/U    ---      History of Present Illness    ---     _GENERAL_ :    Dear Dr [name] [name] : Many thanks for referring [name] [name] to [facility]. His histoy has been extensively outlined- he has presumptive  complex partial seizures/ has been stable for quite some time although a few  weeks ago mentioned he found a picture moved in his home, soreness in his  anterior stomach/ wondering if he had a seizure or not/moved that picture and  so forth, but otherwise stable, no issues, complaints, has refills, wants lab  work, takes seizure/safety precautions.      Current Medications    ---    Taking     * Aspirin 81 MG Tablet Chewable 1 tablet Orally Once a day    ---    * Atorvastatin Calcium 10 MG Tablet 1 tablet Orally Once a day    ---    * CoQ10     ---    * Dilantin 100 mg Capsule TAKE 4 CAPSULES BY MOUTH EVERY MORNING WITH THE 30MG  Orally- NAME BRAND DAW MEDICALLY NECESSARY , Notes: 460 mg/day    ---    * Omeprazole 20 MG Capsule Delayed Release 1 capsule Orally Once a day    ---    * Potassium Chloride 20 MEQ Packet 1 packet with food Orally Once a day    ---    * Medication List reviewed and reconciled with the patient    ---      Past Medical History    ---       See notes- seizures/cardiovascular disease.        ---    Seizures.        ---      Surgical History    ---      No Surgical History documented.    ---      Family History    ---      No FH seizures.    ---      Social History    ---    Tobacco    history: _Never  smoked_   no alcohol or illicits.    ---      Allergies    ---      N.K.D.A.    ---      Hospitalization/Major Diagnostic Procedure    ---      No Hospitalization History.    ---      Review of Systems    ---     _GENERAL_ :    Outpatient questionnaire reviewed. Constitutional No concerns.  Hematologic/Lymphatic No lumps in the neck, lumps in the groin, gingival  bleeding,  epistaxis. Negative for: negative . Positive for: negative . Eyes No  concerns. ENT negative . Cardiovascular No concerns. Respiratory No concerns.  Gastrointestinal No concerns.          Vital Signs    ---    Pain scale 0, Ht-in 5 ft 10 in.      Physical Examination    ---    General appearance is that of a well-appearing, well-groomed, Caucasian male  in no physical distress. Skin is without rash. HEENT is normal. The neck is  supple. Chest is clear to auscultation and abdomen is benign. Limbs are  without edema. He is awake and attentive. He uses language normally and there  is no visuospatial neglect. Conversational skills are normal. Memory is intact  to word recall, recent events, and past medical history. Affect is euthymic.  Cranial nerves are normal. Visual fields and eye movements are full. Pupils  are equal and reactive. Face is symmetric and tongue midline. Muscle bulk,  power, and tone are normal. Reflexesnormal and symmetric bilaterally, no ankle  clonus, plantar responses downgoing bilaterally. CoordinationFinger to nose  intact bilaterally,    Romberg Negative good postural reflexes    Gait:baseline- has an antalgic gait of long standing.      Assessments    ---    1\. Seizure disorder - G40.909 (Primary)    ---      Impression is that of a very pleasant 66 year old male with complex partial  seizures. While remaining on brand name Dilantin 100 mg (4) tablets daily with  which he reports being 100% adherent and with no reported side effects, there  have no further spells concerning for seizure except as noted above which  sounds  unclear. No changes are made to his Dilantin today. Labs and levels  will be checked. Otherwise as long as he remains stable 6 month followup was  recommended or sooner if his condition was to change or any problems were to  arise.    Thank you for the opportunity help in the care of this patient.    ---      Treatment    ---       **1\. Seizure disorder**    _LAB: Phenytoin (Dilantin)_    _LAB: COMP METABOLIC PANEL W/ADJ CALCIUM, PLASMA_    _LAB: CBC (H/H, RBC, INDICES, WBC, PLT)_    _LAB: Liver Function Tests (LFT)_    Notes: seizure/safety/driving precautions reviewed.    ---      Follow Up    ---    6 Months    Electronically signed by [name] Moccasin MD on 03/21/2017 at 12:37 PM EST    Sign off status: Completed        * * *        Neurology    [address]    [place], [place] [address]    Tel: [phone]    Fax: [phone]              * * *          Patient: [name] [name], [name] [name] DOB: 1950/09/16 Progress Note: [name] [name] 03/21/2017    ---    Note generated by eClinicalWorks EMR/PM Software ([email or web address])

## 2017-03-21 NOTE — Progress Notes (Signed)
.  Progress Notes  .  Patient: [name] [name], [name] [name]  Provider: [name]  MD  .  DOB: Aug 09, 1950 Age: 66 Y Sex: Male  .  PCP: Alessandra Grout MD  Date: 03/21/2017  .  --------------------------------------------------------------------------------  .  REASON FOR APPOINTMENT  .  1. F/U  .  HISTORY OF PRESENT ILLNESS  .  GENERAL:   Dear Dr Marin Shutter : Many thanks for referring Mr Kiedrowski to [facility]. His histoy has been extensively  outlined- he has presumptive complex partial seizures/ has been  stable for quite some time although a few weeks ago mentioned he  found a picture moved in his home, soreness in his anterior  stomach/ wondering if he had a seizure or not/moved that picture  and so forth, but otherwise stable, no issues, complaints, has  refills, wants lab work, takes seizure/safety precautions.  .  CURRENT MEDICATIONS  .  Taking Aspirin 81 MG Tablet Chewable 1 tablet Orally Once a day  Taking Atorvastatin Calcium 10 MG Tablet 1 tablet Orally Once a  day  Taking CoQ10  Taking Dilantin 100 mg Capsule TAKE 4 CAPSULES BY MOUTH EVERY  MORNING WITH THE 30MG  Orally- NAME BRAND DAW MEDICALLY NECESSARY  , Notes: 460 mg/day  Taking Omeprazole 20 MG Capsule Delayed Release 1 capsule Orally  Once a day  Taking Potassium Chloride 20 MEQ Packet 1 packet with food Orally  Once a day  Medication List reviewed and reconciled with the patient  .  PAST MEDICAL HISTORY  .  See notes- seizures/cardiovascular disease  Seizures  .  ALLERGIES  .  N.K.D.A.  .  SURGICAL HISTORY  .  No Surgical History documented.  Marland Kitchen  FAMILY HISTORY  .  No FH seizures.  .  SOCIAL HISTORY  .  .  Tobacco  history:Never smoked  .  no alcohol or illicits.  .  HOSPITALIZATION/MAJOR DIAGNOSTIC PROCEDURE  .  No Hospitalization History.  Marland Kitchen  REVIEW OF SYSTEMS  .  GENERAL:  .  Outpatient questionnaire reviewed. Constitutional    No concerns  . Hematologic/Lymphatic    No lumps in the neck, lumps in the  groin, gingival bleeding, epistaxis  . Negative for:    negative   . Positive for:    negative  . Eyes    No concerns . ENT     negative  . Cardiovascular    No concerns . Respiratory    No  concerns . Gastrointestinal    No concerns .  Marland Kitchen  VITAL SIGNS  .  Pain scale 0, Ht-in 5 ft 10 in.  Marland Kitchen  PHYSICAL EXAMINATION  .  General appearance is that of a well-appearing, well-groomed,  Caucasian male in no physical distress. Skin is without rash.  HEENT is normal. The neck is supple. Chest is clear to  auscultation and abdomen is benign. Limbs are without edema. He  is awake and attentive. He uses language normally and there is no  visuospatial neglect. Conversational skills are normal. Memory is  intact to word recall, recent events, and past medical history.  Affect is euthymic. Cranial nerves are normal. Visual fields and  eye movements are full. Pupils are equal and reactive. Face is  symmetric and tongue midline. Muscle bulk, power, and tone are  normal. Reflexesnormal and symmetric bilaterally, no ankle  clonus, plantar responses downgoing bilaterally.  CoordinationFinger to nose intact bilaterally, Romberg Negative  good postural reflexes Gait:baseline- has an antalgic gait of  long  standing.  .  ASSESSMENTS  .  Seizure disorder - G40.909 (Primary)  .  Impression is that of a very pleasant 67 year old male with  complex partial seizures. While remaining on brand name Dilantin  100 mg (4) tablets daily with which he reports being 100%  adherent and with no reported side effects, there have no further  spells concerning for seizure except as noted above which sounds  unclear. No changes are made to his Dilantin today. Labs and  levels will be checked. Otherwise as long as he remains stable 6  month followup was recommended or sooner if his condition was to  change or any problems were to arise.Thank you for the  opportunity help in the care of this patient.  .  TREATMENT  .  Seizure disorder  LAB: Phenytoin (Dilantin)  .  LAB: COMP METABOLIC PANEL W/ADJ  CALCIUM, PLASMA  .  LAB: CBC (H/H, RBC, INDICES, WBC, PLT)  .  LAB: Liver Function Tests (LFT)  .  Notes: seizure/safety/driving precautions reviewed.  .  FOLLOW UP  .  6 Months  .  Electronically signed by [name] MD on  03/21/2017 at 12:37 PM EST  .  Document electronically signed by [name]  MD  .

## 2017-03-23 ENCOUNTER — Encounter: Payer: Self-pay | Admitting: Internal Medicine

## 2017-03-23 ENCOUNTER — Ambulatory Visit (INDEPENDENT_AMBULATORY_CARE_PROVIDER_SITE_OTHER): Payer: Medicare Other | Admitting: Internal Medicine

## 2017-03-23 VITALS — BP 130/88 | HR 58 | Ht 70.0 in | Wt 180.6 lb

## 2017-03-23 DIAGNOSIS — I712 Thoracic aortic aneurysm, without rupture, unspecified: Secondary | ICD-10-CM

## 2017-03-23 DIAGNOSIS — E785 Hyperlipidemia, unspecified: Secondary | ICD-10-CM

## 2017-03-23 DIAGNOSIS — I10 Essential (primary) hypertension: Secondary | ICD-10-CM | POA: Diagnosis not present

## 2017-03-23 NOTE — Patient Instructions (Signed)
Dr. Debara Pickett recommends that you schedule a follow-up appointment as needed

## 2017-03-24 ENCOUNTER — Encounter: Payer: Self-pay | Admitting: Internal Medicine

## 2017-03-24 NOTE — Progress Notes (Signed)
OFFICE NOTE  Chief Complaint:  Follow-up aortic aneurysm, hypertension  Primary Care Physician: [name], MD  HPI:  Brandon [name] is a pleasant 66 year old male who goes by "skip". He has a history of throat cancer and received radiation and chemotherapy. Subsequently he is undergone screening CT scans and was noted to have some pulmonary nodules. There is a history of cigar smoking in the past. Recently his CT scan indicated a small ascending aortic aneurysm, measuring 4.1 cm. This was not previously reported. He is completely asymptomatic, denies any chest pain or shortness of breath. There is no history of aneurysm to his knowledge in the family. He does have borderline hypertension and was never treated with medications until recently. Based on the borderline elevated blood pressures and hypertension he was started on Bystolic, which she seems to be tolerating well. Blood pressure today is 126/84 and heart rate is well-controlled at 54.  Brandon [name] returns today for follow-up of his aortic aneurysm. He recently underwent a repeat CT scan which shows a stable 4.1 cm fusiform ascending aortic aneurysm. He is asymptomatic with this. He denies any chest pain or worsening shortness of breath. He was previously on by systolic and had good blood pressure control, however felt that he was having side effects from the medicine and was ultimately switched to carvedilol. That was recently increased to 6.25 mg twice a day. Heart rate is now in the upper 50s however blood pressure the office today was 180/124. He did take his medicine this morning. I rechecked that and still was elevated at 178/120. He will need additional blood pressure control. Currently denies any headache, shortness of breath, chest pain, blurred vision or change in urine output.  03/22/2016  Brandon [name] was seen today in follow-up. He underwent a repeat CT scan about a week ago which demonstrated no significant change in his  ascending aortic aneurysm at 4.0 cm. Blood pressure has improved significantly however tends to be between 884 to 166 systolic. Based on new American Heart Association guidelines and the fact that he has a aortic aneurysm, he should have stricter blood pressure control. There also may be some evidence that ACE inhibitor or ARB can be helpful at decreasing the likelihood of aneurysm extension. He is currently on amlodipine and carvedilol. Heart rate is in the 50s. Blood pressure today 140/90. Recently his primary care provider switched him from Lipitor to Crestor because of difficulty swallowing the larger pills. He reports being asymptomatic and denies chest pain or shortness of breath.  03/23/2017  Brandon [name] returns today for annual follow-up.  Over the last year he is done fairly well.  He is followed closely by Dr. Ronnald Ramp who recently repeated a CT aortogram for aortic root aneurysm.  That seems to be stable at 4.1 cm.  Blood pressure has been well controlled.  Today 130/88.  He denies any chest pain or worsening shortness of breath.  He had had some recent hypotension and lower extremity swelling.  It appears that he was accidentally taking twice the dose of amlodipine that he was supposed to.  This caused a side effect of swelling and the amlodipine was discontinued completely though.  He is currently on spironolactone as an alternative and blood pressure seems to be well controlled with this.   PMHx:  Past Medical History:  Diagnosis Date  . Allergy    mild  . Aortic aneurysm (HCC)    small and watching  . GERD (gastroesophageal reflux  disease)   . Head and neck cancer    head and neck ca dx 03/2009  . Hyperlipidemia   . Hypertension   . oropharyngeal ca dx'd 03/2009   chemo/xrt comp 06/2009  . Thyroid disease     Past Surgical History:  Procedure Laterality Date  . HERNIA REPAIR     inguinal  . hip replacemnt left Left   . Left eye    . stmach surgery     repair peg tube site     FAMHx:  Family History  Problem Relation Age of Onset  . Cancer Mother        lymphoma  . Colon cancer Mother   . Cancer Father        lung (smoker)  . Hyperlipidemia Father   . Hypertension Father   . Diabetes Father   . Esophageal cancer Neg Hx   . Rectal cancer Neg Hx   . Stomach cancer Neg Hx     SOCHx:   reports that he quit smoking about 7 years ago. His smoking use included cigarettes and cigars. He started smoking about 43 years ago. He has a 52.50 pack-year smoking history. he has never used smokeless tobacco. He reports that he drinks about 9.0 oz of alcohol per week. He reports that he does not use drugs.  ALLERGIES:  Allergies  Allergen Reactions  . Benicar [Olmesartan]     fatigue  . Amlodipine Swelling    ROS: Pertinent items noted in HPI and remainder of comprehensive ROS otherwise negative.  HOME MEDS: Current Outpatient Medications  Medication Sig Dispense Refill  . aspirin 81 MG tablet Take 81 mg by mouth daily.    . carvedilol (COREG) 6.25 MG tablet Take 1 tablet (6.25 mg total) by mouth 2 (two) times daily with a meal. 180 tablet 3  . levothyroxine (SYNTHROID, LEVOTHROID) 100 MCG tablet Take 1 tablet (100 mcg total) daily by mouth. 90 tablet 1  . Magnesium Oxide 400 (240 Mg) MG TABS Take 1 tablet (400 mg total) daily by mouth. 90 tablet 1  . omeprazole (PRILOSEC) 40 MG capsule TAKE 1 CAPSULE DAILY 90 capsule 1  . rosuvastatin (CRESTOR) 20 MG tablet TAKE 1 TABLET DAILY 90 tablet 1  . spironolactone (ALDACTONE) 25 MG tablet Take 1 tablet (25 mg total) daily by mouth. 90 tablet 0  . zolpidem (AMBIEN) 10 MG tablet Take 1 tablet (10 mg total) by mouth at bedtime as needed. 90 tablet 1   No current facility-administered medications for this visit.     LABS/IMAGING: No results found for this or any previous visit (from the past 48 hour(s)). No results found.  VITALS: BP 130/88   Pulse (!) 58   Ht 5\' 10"  (1.778 m)   Wt 180 lb 9.6 oz (81.9 kg)    BMI 25.91 kg/m   EXAM: General appearance: alert and no distress Neck: no carotid bruit and no JVD Lungs: clear to auscultation bilaterally Heart: regular rate and rhythm, S1, S2 normal, no murmur, click, rub or gallop Abdomen: soft, non-tender; bowel sounds normal; no masses,  no organomegaly Extremities: extremities normal, atraumatic, no cyanosis or edema Pulses: 2+ and symmetric Skin: Skin color, texture, turgor normal. No rashes or lesions Neurologic: Grossly normal Psych: Normal  EKG: Sinus bradycardia 58-personally reviewed  ASSESSMENT: 1. Mild ascending aortic aneurysm-4.1 cm (2018, stable) 2. History of tobacco abuse-resolved 3. Hypertension  PLAN: 1.   Brandon [name] has had good blood pressure control recently had lower extremity swelling  on amlodipine but was accidentally taking probably twice the dose that he was supposed to.  Amlodipine was discontinued and was placed on spironolactone which seem to be helpful so far blood pressure is on the high normal side.  Otherwise he is asymptomatic.  His aneurysm is stable.  Plan to continue his current medications.  He has close follow-up with his primary care provider.  He indicated he may wish to follow-up with his primary care provider which is reasonable since he is providing his hypertensive medications and following his aneurysm.  To that end I am happy to see him back as needed.  Pixie Casino, MD, [facility], [place] Director of the [facility]  [facility] Attending Cardiologist  Direct Dial: [phone]  Fax: [phone]  Website:  [email or web address] Osgood Hilty 03/24/2017, 3:06 PM

## 2017-04-01 ENCOUNTER — Encounter: Payer: Self-pay | Admitting: Internal Medicine

## 2017-04-01 DIAGNOSIS — I10 Essential (primary) hypertension: Secondary | ICD-10-CM

## 2017-04-01 DIAGNOSIS — E876 Hypokalemia: Secondary | ICD-10-CM

## 2017-04-02 MED ORDER — SPIRONOLACTONE 25 MG PO TABS: 25.0000 mg | ORAL_TABLET | Freq: Every day | ORAL | 0 refills | Status: DC

## 2017-04-17 LAB — PSA SCREENING (EXT): PSA (EXT): 2.3 ng/mL (ref 0.1–5.0)

## 2017-04-23 ENCOUNTER — Encounter: Payer: Self-pay | Admitting: Internal Medicine

## 2017-04-23 ENCOUNTER — Other Ambulatory Visit (INDEPENDENT_AMBULATORY_CARE_PROVIDER_SITE_OTHER): Payer: Medicare Other

## 2017-04-23 ENCOUNTER — Ambulatory Visit (INDEPENDENT_AMBULATORY_CARE_PROVIDER_SITE_OTHER): Payer: Medicare Other | Admitting: Internal Medicine

## 2017-04-23 VITALS — BP 130/80 | HR 60 | Temp 97.9°F | Resp 16 | Ht 70.0 in | Wt 184.1 lb

## 2017-04-23 DIAGNOSIS — E039 Hypothyroidism, unspecified: Secondary | ICD-10-CM

## 2017-04-23 DIAGNOSIS — F5101 Primary insomnia: Secondary | ICD-10-CM

## 2017-04-23 DIAGNOSIS — Z0000 Encounter for general adult medical examination without abnormal findings: Secondary | ICD-10-CM

## 2017-04-23 DIAGNOSIS — K2100 Gastro-esophageal reflux disease with esophagitis, without bleeding: Secondary | ICD-10-CM

## 2017-04-23 DIAGNOSIS — I10 Essential (primary) hypertension: Secondary | ICD-10-CM

## 2017-04-23 DIAGNOSIS — N400 Enlarged prostate without lower urinary tract symptoms: Secondary | ICD-10-CM | POA: Diagnosis not present

## 2017-04-23 DIAGNOSIS — E785 Hyperlipidemia, unspecified: Secondary | ICD-10-CM

## 2017-04-23 LAB — BASIC METABOLIC PANEL
BUN: 24 mg/dL — AB (ref 6–23)
CO2: 31 meq/L (ref 19–32)
Calcium: 9.5 mg/dL (ref 8.4–10.5)
Chloride: 101 mEq/L (ref 96–112)
Creatinine, Ser: 0.93 mg/dL (ref 0.40–1.50)
GFR: 86.66 mL/min (ref >60.00)
GLUCOSE: 107 mg/dL — AB (ref 70–99)
POTASSIUM: 4.6 meq/L (ref 3.5–5.1)
Sodium: 142 mEq/L (ref 135–145)

## 2017-04-23 LAB — MAGNESIUM: Magnesium: 1.1 mg/dL — ABNORMAL LOW (ref 1.5–2.5)

## 2017-04-23 LAB — TSH: TSH: 2.08 u[IU]/mL (ref 0.35–4.50)

## 2017-04-23 LAB — PSA: PSA: 0.68 ng/mL (ref 0.10–4.00)

## 2017-04-23 MED ORDER — ZOLPIDEM TARTRATE 10 MG PO TABS: 10.0000 mg | ORAL_TABLET | Freq: Every evening | ORAL | 1 refills | Status: DC | PRN

## 2017-04-23 MED ORDER — OMEPRAZOLE 40 MG PO CPDR: 40.0000 mg | DELAYED_RELEASE_CAPSULE | Freq: Every day | ORAL | 1 refills | Status: DC

## 2017-04-23 MED ORDER — LEVOTHYROXINE SODIUM 100 MCG PO TABS: 100.0000 ug | ORAL_TABLET | Freq: Every day | ORAL | 1 refills | Status: DC

## 2017-04-23 MED ORDER — MAGNESIUM 100 MG PO CAPS: 1.0000 | ORAL_CAPSULE | Freq: Three times a day (TID) | ORAL | 1 refills | Status: DC

## 2017-04-23 NOTE — Patient Instructions (Signed)

## 2017-04-23 NOTE — Progress Notes (Signed)
Subjective:  Patient ID: Brandon [name], male    DOB: 1950-12-08  Age: 67 y.o. MRN: [id number]  CC: Hypothyroidism; Annual Exam; Hypertension; and Gastroesophageal Reflux   HPI Brandon [name] presents for a CPX.  He is being treated for hypomagnesemia and complains that the magnesium tablets are too big for him to swallow.  He wants to try a smaller magnesium supplement.  He feels like his thyroid level is adequate.  He has had no recent episodes of fatigue, constipation, or diarrhea.  He also tells me his blood pressure has been well controlled.  He denies any recent episodes of CP, DOE, palpitations, or edema.  Past Medical History:  Diagnosis Date  . Allergy    mild  . Aortic aneurysm (HCC)    small and watching  . GERD (gastroesophageal reflux disease)   . Head and neck cancer    head and neck ca dx 03/2009  . Hyperlipidemia   . Hypertension   . oropharyngeal ca dx'd 03/2009   chemo/xrt comp 06/2009  . Thyroid disease    Past Surgical History:  Procedure Laterality Date  . HERNIA REPAIR     inguinal  . hip replacemnt left Left   . Left eye    . stmach surgery     repair peg tube site    reports that he quit smoking about 8 years ago. His smoking use included cigarettes and cigars. He started smoking about 43 years ago. He has a 52.50 pack-year smoking history. he has never used smokeless tobacco. He reports that he drinks about 9.0 oz of alcohol per week. He reports that he does not use drugs. family history includes Cancer in his father and mother; Colon cancer in his mother; Diabetes in his father; Hyperlipidemia in his father; Hypertension in his father. Allergies  Allergen Reactions  . Benicar [Olmesartan]     fatigue  . Amlodipine Swelling    Outpatient Medications Prior to Visit  Medication Sig Dispense Refill  . aspirin 81 MG tablet Take 81 mg by mouth daily.    . carvedilol (COREG) 6.25 MG tablet Take 1 tablet (6.25 mg total) by mouth 2 (two) times  daily with a meal. 180 tablet 3  . rosuvastatin (CRESTOR) 20 MG tablet TAKE 1 TABLET DAILY 90 tablet 1  . spironolactone (ALDACTONE) 25 MG tablet Take 1 tablet (25 mg total) by mouth daily. 90 tablet 0  . levothyroxine (SYNTHROID, LEVOTHROID) 100 MCG tablet Take 1 tablet (100 mcg total) daily by mouth. 90 tablet 1  . Magnesium Oxide 400 (240 Mg) MG TABS Take 1 tablet (400 mg total) daily by mouth. 90 tablet 1  . omeprazole (PRILOSEC) 40 MG capsule TAKE 1 CAPSULE DAILY 90 capsule 1  . zolpidem (AMBIEN) 10 MG tablet Take 1 tablet (10 mg total) by mouth at bedtime as needed. 90 tablet 1   No facility-administered medications prior to visit.     ROS Review of Systems  Constitutional: Negative.  Negative for appetite change, diaphoresis, fatigue and unexpected weight change.  HENT: Positive for trouble swallowing. Negative for sinus pressure and voice change.   Eyes: Negative.  Negative for visual disturbance.  Respiratory: Negative for cough, chest tightness, shortness of breath and wheezing.   Cardiovascular: Negative for chest pain, palpitations and leg swelling.  Gastrointestinal: Negative for abdominal pain, constipation, diarrhea, nausea and vomiting.  Endocrine: Negative.   Genitourinary: Negative.  Negative for difficulty urinating, dysuria, scrotal swelling, testicular pain and urgency.  Musculoskeletal: Negative.  Negative for arthralgias and myalgias.  Skin: Negative.   Allergic/Immunologic: Negative.   Neurological: Negative.  Negative for dizziness and weakness.  Hematological: Negative for adenopathy. Does not bruise/bleed easily.  Psychiatric/Behavioral: Positive for sleep disturbance. Negative for decreased concentration, dysphoric mood and suicidal ideas. The patient is not nervous/anxious.     Objective:  BP 130/80 (BP Location: Left Arm, Patient Position: Sitting, Cuff Size: Normal)   Pulse 60   Temp 97.9 F (36.6 C) (Oral)   Resp 16   Ht 5\' 10"  (1.778 m)   Wt 184 lb  2 oz (83.5 kg)   SpO2 97%   BMI 26.42 kg/m   BP Readings from Last 3 Encounters:  04/23/17 130/80  03/23/17 130/88  02/20/17 (!) 180/120    Wt Readings from Last 3 Encounters:  04/23/17 184 lb 2 oz (83.5 kg)  03/23/17 180 lb 9.6 oz (81.9 kg)  02/20/17 183 lb (83 kg)    Physical Exam  Constitutional: He is oriented to person, place, and time. No distress.  HENT:  Mouth/Throat: Oropharynx is clear and moist. No oropharyngeal exudate.  Eyes: Conjunctivae are normal. Left eye exhibits no discharge. No scleral icterus.  Neck: Normal range of motion. Neck supple. No JVD present. No thyromegaly present.  Cardiovascular: Normal rate, regular rhythm and normal heart sounds. Exam reveals no gallop.  No murmur heard. Pulmonary/Chest: Effort normal and breath sounds normal. No respiratory distress. He has no wheezes. He has no rales.  Abdominal: Soft. Bowel sounds are normal. He exhibits no mass. There is no tenderness. Hernia confirmed negative in the right inguinal area and confirmed negative in the left inguinal area.  Genitourinary: Rectum normal, testes normal and penis normal. Rectal exam shows no external hemorrhoid, no internal hemorrhoid, no fissure, no mass, no tenderness, anal tone normal and guaiac negative stool. Prostate is enlarged (1+ smooth symm BPH). Prostate is not tender. Right testis shows no mass, no swelling and no tenderness. Left testis shows no mass, no swelling and no tenderness. Circumcised. No penile erythema or penile tenderness. No discharge found.  Musculoskeletal: Normal range of motion. He exhibits no edema, tenderness or deformity.  Lymphadenopathy:    He has no cervical adenopathy.       Right: No inguinal adenopathy present.       Left: No inguinal adenopathy present.  Neurological: He is alert and oriented to person, place, and time.  Skin: Skin is warm and dry. No rash noted. He is not diaphoretic. No erythema. No pallor.  Psychiatric: He has a normal  mood and affect. His behavior is normal. Judgment and thought content normal.  Vitals reviewed.   Lab Results  Component Value Date   WBC 6.5 12/25/2016   HGB 14.6 12/25/2016   HCT 41.9 12/25/2016   PLT 316.0 12/25/2016   GLUCOSE 107 (H) 04/23/2017   CHOL 170 02/20/2017   TRIG 62.0 02/20/2017   HDL 82.70 02/20/2017   LDLDIRECT 147.7 12/03/2007   LDLCALC 75 02/20/2017   ALT 17 12/25/2016   AST 29 12/25/2016   NA 142 04/23/2017   K 4.6 04/23/2017   CL 101 04/23/2017   CREATININE 0.93 04/23/2017   BUN 24 (H) 04/23/2017   CO2 31 04/23/2017   TSH 2.08 04/23/2017   PSA 0.68 04/23/2017   INR 1.0 09/01/2008   HGBA1C 5.5 02/20/2017    Ct Angio Chest Aorta W &/or Wo Contrast  Result Date: 03/09/2017 CLINICAL DATA:  Thoracic aortic aneurysm without rupture. EXAM: CT ANGIOGRAPHY CHEST WITH  CONTRAST TECHNIQUE: Multidetector CT imaging of the chest was performed using the standard protocol during bolus administration of intravenous contrast. Multiplanar CT image reconstructions and MIPs were obtained to evaluate the vascular anatomy. CONTRAST:  165mL ISOVUE-370 IOPAMIDOL (ISOVUE-370) INJECTION 76% COMPARISON:  CT scan of March 09, 2016. FINDINGS: Cardiovascular: 4.1 cm ascending thoracic aortic aneurysm is noted which is not significantly changed compared to prior exam. Transverse aortic arch measures 2.7 cm. Proximal descending thoracic aorta measures 2.7 cm. No dissection is noted. Atherosclerosis of thoracic aorta is noted. Normal cardiac size. No pericardial effusion is noted Mediastinum/Nodes: Thyroid gland and esophagus are unremarkable. Stable mild mediastinal adenopathy is noted which most likely is reactive in etiology. Lungs/Pleura: Stable biapical scarring is noted. No pneumothorax or pleural effusion is noted. No acute pulmonary disease is noted. Upper Abdomen: Stable large left renal cyst is noted. No other abnormality seen in the visualized portion of the upper abdomen.  Musculoskeletal: No chest wall abnormality. No acute or significant osseous findings. Review of the MIP images confirms the above findings. IMPRESSION: 4.1 cm ascending thoracic aortic aneurysm is noted which is not significantly changed compared to prior exam. Recommend annual imaging followup by CTA or MRA. This recommendation follows 2010 ACCF/AHA/AATS/ACR/ASA/SCA/SCAI/SIR/STS/SVM Guidelines for the Diagnosis and Management of Patients with Thoracic Aortic Disease. Circulation. 2010; 121: J093-O671. Aortic Atherosclerosis (ICD10-I70.0). Electronically Signed   By: Marijo Conception, M.D.   On: 03/09/2017 10:53    Visual Acuity Screening   Right eye Left eye Both eyes  Without correction: 20/40 20/20 20/20   With correction:     Hearing Screening Comments: Patient passed whisper test.    Assessment & Plan:   Brandon [name] was seen today for hypothyroidism, annual exam, hypertension and gastroesophageal reflux.  Diagnoses and all orders for this visit:  Hyperlipidemia with target LDL less than 130- He has achieved his LDL goal is doing well on the statin.  Essential hypertension, benign- His blood pressure is adequately well controlled.  Electrolytes and renal function are normal. -     Basic metabolic panel; Future -     Magnesium; Future  Acquired hypothyroidism- His TSH is in the normal range.  He will remain on the current dose of levothyroxine. -     TSH; Future -     levothyroxine (SYNTHROID, LEVOTHROID) 100 MCG tablet; Take 1 tablet (100 mcg total) by mouth daily.  Routine general medical examination at a health care facility  Hypomagnesemia- His magnesium level remains low.  Will try a smaller magnesium supplement but twice a day instead of once a day. -     Magnesium; Future -     Discontinue: Magnesium (M2 MAGNESIUM) 100 MG CAPS; Take 1 capsule (100 mg total) by mouth 3 (three) times daily with meals. -     Magnesium Oxide 250 MG TABS; Take 1 tablet (250 mg total) by mouth 2 (two)  times daily.  Primary insomnia -     zolpidem (AMBIEN) 10 MG tablet; Take 1 tablet (10 mg total) by mouth at bedtime as needed.  Benign prostatic hyperplasia without lower urinary tract symptoms- His PSA remains low which is reassuring that he does not have prostate cancer.  He has no symptoms that need to be treated. -     PSA; Future  Gastroesophageal reflux disease with esophagitis- His symptoms are well controlled with the PPI.  Will continue at the current dose. -     omeprazole (PRILOSEC) 40 MG capsule; Take 1 capsule (40 mg total) by  mouth daily.   I have discontinued Brandon Griffes. Lewis "Skip"'s Magnesium Oxide and Magnesium. I have also changed his omeprazole and levothyroxine. Additionally, I am having him start on Magnesium Oxide. Lastly, I am having him maintain his aspirin, carvedilol, rosuvastatin, spironolactone, and zolpidem.  Meds ordered this encounter  Medications  . zolpidem (AMBIEN) 10 MG tablet    Sig: Take 1 tablet (10 mg total) by mouth at bedtime as needed.    Dispense:  90 tablet    Refill:  1  . omeprazole (PRILOSEC) 40 MG capsule    Sig: Take 1 capsule (40 mg total) by mouth daily.    Dispense:  90 capsule    Refill:  1  . levothyroxine (SYNTHROID, LEVOTHROID) 100 MCG tablet    Sig: Take 1 tablet (100 mcg total) by mouth daily.    Dispense:  90 tablet    Refill:  1  . DISCONTD: Magnesium (M2 MAGNESIUM) 100 MG CAPS    Sig: Take 1 capsule (100 mg total) by mouth 3 (three) times daily with meals.    Dispense:  270 capsule    Refill:  1  . Magnesium Oxide 250 MG TABS    Sig: Take 1 tablet (250 mg total) by mouth 2 (two) times daily.    Dispense:  180 tablet    Refill:  1   See AVS for instructions about healthy living and anticipatory guidance.  Follow-up: Return in about 6 months (around 10/21/2017).  Scarlette Calico, MD

## 2017-04-24 ENCOUNTER — Telehealth: Payer: Self-pay

## 2017-04-24 MED ORDER — MAGNESIUM OXIDE 250 MG PO TABS: 1.0000 | ORAL_TABLET | Freq: Two times a day (BID) | ORAL | 1 refills | Status: DC

## 2017-04-24 NOTE — Telephone Encounter (Signed)
[facility] received refill request for Magnesium Oxide 400 mg tablet taking 1 cap (100 mg) TID. Pharmacy states it only comes in 250 mg and 400 mg as the 100 mg is on back order. Please advise on which one you would like to give patient.  Thanks

## 2017-04-26 ENCOUNTER — Telehealth: Payer: Self-pay

## 2017-04-26 NOTE — Telephone Encounter (Signed)
New Key: [facility]

## 2017-04-26 NOTE — Telephone Encounter (Signed)
Key: [id number]

## 2017-04-27 ENCOUNTER — Ambulatory Visit

## 2017-06-14 ENCOUNTER — Telehealth: Payer: Self-pay

## 2017-06-14 DIAGNOSIS — I10 Essential (primary) hypertension: Secondary | ICD-10-CM

## 2017-06-14 DIAGNOSIS — E876 Hypokalemia: Secondary | ICD-10-CM

## 2017-06-14 MED ORDER — SPIRONOLACTONE 25 MG PO TABS: 25.0000 mg | ORAL_TABLET | Freq: Every day | ORAL | 1 refills | Status: DC

## 2017-06-14 NOTE — Telephone Encounter (Signed)
[facility] to [facility] spironolactone 25 mg tablet. LOV 04/2017. Erx sent. Pt to follow up in 6 months per AVS

## 2017-07-03 ENCOUNTER — Encounter: Payer: Self-pay | Admitting: Internal Medicine

## 2017-07-03 NOTE — Telephone Encounter (Signed)
PA for zolpidem 10 mg tablet Key: [id number]  PA for omeprazole 40 mg capsule Key: [id number]

## 2017-07-04 ENCOUNTER — Telehealth: Payer: Self-pay | Admitting: Internal Medicine

## 2017-07-04 NOTE — Telephone Encounter (Signed)
Copied from [place] [phone]. Topic: Quick Communication - See Telephone Encounter >> Jul 04, 2017  1:44 PM Ether Griffins B wrote: CRM for notification. See Telephone encounter for: 07/04/17. Melissa with [facility] calling wanting to verify that two PA's for QTY have been faxed to the office and received for the pt on Zolpidem and omeprazole. They are hoping these can be completed and sent back as urgent. Call back number [phone]. They are requesting any supportive documentation on both medications that would explain his condition is severe and needs the medication.

## 2017-07-06 ENCOUNTER — Ambulatory Visit: Admitting: Neurology

## 2017-07-06 NOTE — Telephone Encounter (Signed)
Patient's wife Joelene Millin says she just got off the phone with the patient's insurance carrier who stated they never received PA for Omeprazole. Please see note below. The insurance carrier did say they received one sheet of paper that says "Zolpidem" without any medical necessity noted or critical additional information. This is the only phone note regarding this as the patient has been corresponding about it via [employer]. Joelene Millin is requesting a call back from [place].

## 2017-07-06 NOTE — Telephone Encounter (Signed)
There is a previous note in pt chart regarding same. Closing this note.

## 2017-07-06 NOTE — Telephone Encounter (Signed)
Form for zolpidem completed and placed on PCP desk.

## 2017-07-06 NOTE — Progress Notes (Signed)
* * *        **  [name], [name] [name]**    --- ---    13 Y old Male, DOB: [birth year]    [address], [place], [place] [address]    Home: [phone]    Provider: [name]        * * *    Telephone Encounter    ---    Answered by   Charyl Dancer  Date: 07/06/2017         Time: 12:52 PM    Caller   [name]    --- ---            Reason   Meds / Appointment            Message                      Hi,       Patient says he needs to see Dr. [name] as soon as possible because his medication needs adjustment, he also mentioned that his Dilantin is very low. Please call him back at [phone] to schedule his appointment.      Thank you                 Action Taken                      Frimpong,Eric  07/06/2017 12:55:29 PM >       [name],[name]  07/06/2017 1:07:09 PM > nothing open will fwd to dr to assist. thanks Dez.      Georgia Baria  07/07/2017 6:57:56 AM > Corrie Dandy- can you pls triage and we can discuss- wht exactly is his dilantin level- this happens numerouns times- did he forget some meds?Ardith Dark- not sure an appointment will help but let's try to triage and then after we speak he can have a follow up and then we can delegate to [facility] to pls book with a resident on a Tuesday or a Wedneday with a resident or next  avail if truly emergent but we have cycled through this very issue with him numerous times over the course of his care from [facility] to [facility] under my care Junious Silk  07/09/2017 8:58:42 AM > spoke with him level - >10- let's add in those days I emailed some  days- advised no driving/ JO       [name],[name]  07/09/2017 9:40:36 AM >                     * * *                ---          * * *          Patient: Lerch, [name] [name] DOB: Jun 14, 1950 Provider: [name] 07/06/2017    ---    Note generated by eClinicalWorks EMR/PM Software ([email or web address])

## 2017-07-09 ENCOUNTER — Other Ambulatory Visit: Payer: Self-pay | Admitting: Internal Medicine

## 2017-07-09 ENCOUNTER — Encounter: Payer: Self-pay | Admitting: Internal Medicine

## 2017-07-09 DIAGNOSIS — K2100 Gastro-esophageal reflux disease with esophagitis, without bleeding: Secondary | ICD-10-CM

## 2017-07-09 MED ORDER — RANITIDINE HCL 300 MG PO TABS: 300.0000 mg | ORAL_TABLET | Freq: Every day | ORAL | 1 refills | Status: DC

## 2017-07-09 NOTE — Telephone Encounter (Signed)
Contacted Brandon [name] (pt spouse - on [place]) and informed that I did indeed get a denial. Sent paper letter via [employer].  Spouse is sending an updated rx card to try again.

## 2017-07-10 NOTE — Telephone Encounter (Signed)
[employer] message sent informing that PA for the omeprazole has been approved. I am still awaiting on the response for the zolpidem.

## 2017-07-17 ENCOUNTER — Ambulatory Visit: Admit: 2017-07-17 | Payer: Medicare Other

## 2017-07-17 ENCOUNTER — Ambulatory Visit: Admitting: Neurology

## 2017-07-17 ENCOUNTER — Other Ambulatory Visit (INDEPENDENT_AMBULATORY_CARE_PROVIDER_SITE_OTHER): Payer: Medicare Other

## 2017-07-17 ENCOUNTER — Ambulatory Visit (INDEPENDENT_AMBULATORY_CARE_PROVIDER_SITE_OTHER): Payer: Medicare Other | Admitting: Internal Medicine

## 2017-07-17 ENCOUNTER — Encounter: Payer: Self-pay | Admitting: Internal Medicine

## 2017-07-17 VITALS — BP 124/82 | HR 75 | Temp 97.8°F | Resp 16 | Ht 70.0 in | Wt 179.2 lb

## 2017-07-17 DIAGNOSIS — I10 Essential (primary) hypertension: Secondary | ICD-10-CM | POA: Diagnosis not present

## 2017-07-17 DIAGNOSIS — E038 Other specified hypothyroidism: Secondary | ICD-10-CM | POA: Diagnosis not present

## 2017-07-17 DIAGNOSIS — G40909 Epilepsy, unspecified, not intractable, without status epilepticus: Principal | ICD-10-CM

## 2017-07-17 DIAGNOSIS — ? GLASGOW COMA SC SCORE 13-: Secondary | ICD-10-CM

## 2017-07-17 LAB — HX TOXICOLOGY-TDM: HX PHENYTOIN (DILANTIN), TOTAL: 16.2 ug/mL (ref 10.0–20.0)

## 2017-07-17 LAB — BASIC METABOLIC PANEL
BUN: 22 mg/dL (ref 6–23)
CALCIUM: 9.1 mg/dL (ref 8.4–10.5)
CO2: 26 mEq/L (ref 19–32)
Chloride: 103 mEq/L (ref 96–112)
Creatinine, Ser: 0.98 mg/dL (ref 0.40–1.50)
GFR: 81.52 mL/min (ref >60.00)
GLUCOSE: 83 mg/dL (ref 70–99)
Potassium: 3.4 mEq/L — ABNORMAL LOW (ref 3.5–5.1)
Sodium: 138 mEq/L (ref 135–145)

## 2017-07-17 LAB — MAGNESIUM: MAGNESIUM: 1.2 mg/dL — AB (ref 1.5–2.5)

## 2017-07-17 LAB — TSH: TSH: 1.98 u[IU]/mL (ref 0.35–4.50)

## 2017-07-17 MED ORDER — Dilantin: 30 | 90 | Freq: Every day | 6 refills | 0 days | Status: AC

## 2017-07-17 MED ORDER — Dilantin: 100 | Capsule | 3 refills | 0 days | Status: AC

## 2017-07-17 NOTE — Progress Notes (Signed)
* * *        **  [name], [name]**    --- ---    46 Y old Male, DOB: [birth year]    [address], [place], [place] [address]    Home: [phone]    Provider: [name]        * * *    Telephone Encounter    ---    Answered by   [name]  Date: 07/17/2017         Time: 04:06 PM    Action Taken                      Raushanah Osmundson  07/17/2017 4:06:38 PM > patient came to office but learned he was not covered for the visit- he recently had a low Dilantin level/ see phone note, wanted followup so level booked, will que scripts/ refills/ all questions answered, will determine how to proceed about coverage for further appointments/ he will cb PRN [name]         --- ---            Refills  Refill Dilantin Capsule, 100 mg, Orally- NAME BRAND DAW MEDICALLY  NECESSARY, 360 Capsule, TAKE 4 CAPSULES BY MOUTH EVERY MORNING WITH THE 30MG ,  90 days, Refills=3    --- ---      Start Dilantin Capsule, 30 mg, Orally, 90, as directed, sig up to 3 tablets  daily may undergo titration, 1 month, Refills=6          * * *                ---          * * *          Patient: [name], [name] DOB: [birth year] Provider: [name] 07/17/2017    ---    Note generated by eClinicalWorks EMR/PM Software ([email or web address])

## 2017-07-17 NOTE — Patient Instructions (Signed)
Hypothyroidism Hypothyroidism is a disorder of the thyroid. The thyroid is a large gland that is located in the lower front of the neck. The thyroid releases hormones that control how the body works. With hypothyroidism, the thyroid does not make enough of these hormones. What are the causes? Causes of hypothyroidism may include:  Viral infections.  Pregnancy.  Your own defense system (immune system) attacking your thyroid.  Certain medicines.  Birth defects.  Past radiation treatments to your head or neck.  Past treatment with radioactive iodine.  Past surgical removal of part or all of your thyroid.  Problems with the gland that is located in the center of your brain (pituitary).  What are the signs or symptoms? Signs and symptoms of hypothyroidism may include:  Feeling as though you have no energy (lethargy).  Inability to tolerate cold.  Weight gain that is not explained by a change in diet or exercise habits.  Dry skin.  Coarse hair.  Menstrual irregularity.  Slowing of thought processes.  Constipation.  Sadness or depression.  How is this diagnosed? Your health care provider may diagnose hypothyroidism with blood tests and ultrasound tests. How is this treated? Hypothyroidism is treated with medicine that replaces the hormones that your body does not make. After you begin treatment, it may take several weeks for symptoms to go away. Follow these instructions at home:  Take medicines only as directed by your health care provider.  If you start taking any new medicines, tell your health care provider.  Keep all follow-up visits as directed by your health care provider. This is important. As your condition improves, your dosage needs may change. You will need to have blood tests regularly so that your health care provider can watch your condition. Contact a health care provider if:  Your symptoms do not get better with treatment.  You are taking thyroid  replacement medicine and: ? You sweat excessively. ? You have tremors. ? You feel anxious. ? You lose weight rapidly. ? You cannot tolerate heat. ? You have emotional swings. ? You have diarrhea. ? You feel weak. Get help right away if:  You develop chest pain.  You develop an irregular heartbeat.  You develop a rapid heartbeat. This information is not intended to replace advice given to you by your health care provider. Make sure you discuss any questions you have with your health care provider. Document Released: 03/27/2005 Document Revised: 09/02/2015 Document Reviewed: 08/12/2013 Elsevier Interactive Patient Education  2018 [employer]  

## 2017-07-17 NOTE — Progress Notes (Signed)
Subjective:  Patient ID: Brandon [name], male    DOB: Aug 11, 1950  Age: 67 y.o. MRN: [id number]  CC: Hypothyroidism and Hypertension   HPI TAYLEN OSORTO presents for f/up - He tells me his blood pressure has been well controlled and he denies chest pain, shortness of breath, palpitations, lightheadedness, or dizziness.  He is only taking the magnesium oxide once a day.  Outpatient Medications Prior to Visit  Medication Sig Dispense Refill  . aspirin 81 MG tablet Take 81 mg by mouth daily.    . carvedilol (COREG) 6.25 MG tablet Take 1 tablet (6.25 mg total) by mouth 2 (two) times daily with a meal. 180 tablet 3  . DUEXIS 800-26.6 MG TABS TAKE ONE TABLET BY MOUTH THREE TIMES DAILY FOR 30 DAYS.  2  . levothyroxine (SYNTHROID, LEVOTHROID) 100 MCG tablet Take 1 tablet (100 mcg total) by mouth daily. 90 tablet 1  . Magnesium 250 MG TABS Take 1 tablet by mouth 2 (two) times daily.  1  . Magnesium Oxide 250 MG TABS Take 1 tablet (250 mg total) by mouth 2 (two) times daily. 180 tablet 1  . omeprazole (PRILOSEC) 40 MG capsule     . rosuvastatin (CRESTOR) 20 MG tablet TAKE 1 TABLET DAILY 90 tablet 1  . spironolactone (ALDACTONE) 25 MG tablet Take 1 tablet (25 mg total) by mouth daily. 90 tablet 1  . zolpidem (AMBIEN) 10 MG tablet Take 1 tablet (10 mg total) by mouth at bedtime as needed. 90 tablet 1  . ranitidine (ZANTAC) 300 MG tablet Take 1 tablet (300 mg total) by mouth at bedtime. 90 tablet 1   No facility-administered medications prior to visit.     ROS Review of Systems  Constitutional: Negative.  Negative for appetite change, diaphoresis, fatigue and unexpected weight change.  HENT: Negative.  Negative for trouble swallowing.   Eyes: Negative for visual disturbance.  Respiratory: Negative for cough, chest tightness, shortness of breath and wheezing.   Cardiovascular: Negative for chest pain, palpitations and leg swelling.  Gastrointestinal: Negative for abdominal pain, constipation,  diarrhea, nausea and vomiting.  Endocrine: Negative for cold intolerance and heat intolerance.  Genitourinary: Negative.  Negative for difficulty urinating.  Musculoskeletal: Negative.  Negative for arthralgias, myalgias and neck pain.  Skin: Negative.  Negative for color change.  Allergic/Immunologic: Negative.   Neurological: Negative.  Negative for dizziness, weakness, light-headedness and numbness.  Hematological: Negative for adenopathy. Does not bruise/bleed easily.  Psychiatric/Behavioral: Negative.     Objective:  BP 124/82 (BP Location: Left Arm, Patient Position: Sitting, Cuff Size: Normal)   Pulse 75   Temp 97.8 F (36.6 C) (Oral)   Resp 16   Ht 5\' 10"  (1.778 m)   Wt 179 lb 4 oz (81.3 kg)   SpO2 97%   BMI 25.72 kg/m   BP Readings from Last 3 Encounters:  07/17/17 124/82  04/23/17 130/80  03/23/17 130/88    Wt Readings from Last 3 Encounters:  07/17/17 179 lb 4 oz (81.3 kg)  04/23/17 184 lb 2 oz (83.5 kg)  03/23/17 180 lb 9.6 oz (81.9 kg)    Physical Exam  Constitutional: He is oriented to person, place, and time. No distress.  HENT:  Mouth/Throat: Oropharynx is clear and moist. No oropharyngeal exudate.  Eyes: Conjunctivae are normal. No scleral icterus.  Neck: Normal range of motion. Neck supple. No JVD present. No thyromegaly present.  Cardiovascular: Normal rate, regular rhythm and normal heart sounds. Exam reveals no gallop and no  friction rub.  No murmur heard. Pulmonary/Chest: Effort normal and breath sounds normal. No stridor. No respiratory distress. He has no wheezes. He has no rales.  Abdominal: Soft. Bowel sounds are normal. He exhibits no distension and no mass. There is no tenderness. There is no guarding.  Musculoskeletal: Normal range of motion. He exhibits no edema, tenderness or deformity.  Lymphadenopathy:    He has no cervical adenopathy.  Neurological: He is alert and oriented to person, place, and time.  Skin: Skin is warm and dry. He  is not diaphoretic.  Vitals reviewed.   Lab Results  Component Value Date   WBC 6.5 12/25/2016   HGB 14.6 12/25/2016   HCT 41.9 12/25/2016   PLT 316.0 12/25/2016   GLUCOSE 83 07/17/2017   CHOL 170 02/20/2017   TRIG 62.0 02/20/2017   HDL 82.70 02/20/2017   LDLDIRECT 147.7 12/03/2007   LDLCALC 75 02/20/2017   ALT 17 12/25/2016   AST 29 12/25/2016   NA 138 07/17/2017   K 3.4 (L) 07/17/2017   CL 103 07/17/2017   CREATININE 0.98 07/17/2017   BUN 22 07/17/2017   CO2 26 07/17/2017   TSH 1.98 07/17/2017   PSA 0.68 04/23/2017   INR 1.0 09/01/2008   HGBA1C 5.5 02/20/2017    Ct Angio Chest Aorta W &/or Wo Contrast  Result Date: 03/09/2017 CLINICAL DATA:  Thoracic aortic aneurysm without rupture. EXAM: CT ANGIOGRAPHY CHEST WITH CONTRAST TECHNIQUE: Multidetector CT imaging of the chest was performed using the standard protocol during bolus administration of intravenous contrast. Multiplanar CT image reconstructions and MIPs were obtained to evaluate the vascular anatomy. CONTRAST:  136mL ISOVUE-370 IOPAMIDOL (ISOVUE-370) INJECTION 76% COMPARISON:  CT scan of March 09, 2016. FINDINGS: Cardiovascular: 4.1 cm ascending thoracic aortic aneurysm is noted which is not significantly changed compared to prior exam. Transverse aortic arch measures 2.7 cm. Proximal descending thoracic aorta measures 2.7 cm. No dissection is noted. Atherosclerosis of thoracic aorta is noted. Normal cardiac size. No pericardial effusion is noted Mediastinum/Nodes: Thyroid gland and esophagus are unremarkable. Stable mild mediastinal adenopathy is noted which most likely is reactive in etiology. Lungs/Pleura: Stable biapical scarring is noted. No pneumothorax or pleural effusion is noted. No acute pulmonary disease is noted. Upper Abdomen: Stable large left renal cyst is noted. No other abnormality seen in the visualized portion of the upper abdomen. Musculoskeletal: No chest wall abnormality. No acute or significant  osseous findings. Review of the MIP images confirms the above findings. IMPRESSION: 4.1 cm ascending thoracic aortic aneurysm is noted which is not significantly changed compared to prior exam. Recommend annual imaging followup by CTA or MRA. This recommendation follows 2010 ACCF/AHA/AATS/ACR/ASA/SCA/SCAI/SIR/STS/SVM Guidelines for the Diagnosis and Management of Patients with Thoracic Aortic Disease. Circulation. 2010; 121: Z610-R604. Aortic Atherosclerosis (ICD10-I70.0). Electronically Signed   By: Marijo Conception, M.D.   On: 03/09/2017 10:53    Assessment & Plan:   Savon was seen today for hypothyroidism and hypertension.  Diagnoses and all orders for this visit:  Essential hypertension, benign- His blood pressure is adequately well controlled.  He continues to have mild hypokalemia.  Will continue the potassium sparing diuretic. -     Basic metabolic panel; Future -     Magnesium; Future  Other specified hypothyroidism- His TSH is in the normal range.  He will remain on the current dose of levothyroxine. -     TSH; Future  Hypomagnesemia- His magnesium level remains mildly low.  He is not symptomatic with this.  I  have asked him to be more compliant with the magnesium replacement therapy. -     Magnesium; Future   I have discontinued Gildardo Griffes. Lien "Skip"'s ranitidine. I am also having him maintain his aspirin, carvedilol, rosuvastatin, zolpidem, levothyroxine, Magnesium Oxide, spironolactone, omeprazole, DUEXIS, and Magnesium.  No orders of the defined types were placed in this encounter.    Follow-up: Return in about 6 months (around 01/16/2018).  Scarlette Calico, MD

## 2017-07-18 ENCOUNTER — Ambulatory Visit: Admitting: Neurology

## 2017-07-18 NOTE — Progress Notes (Signed)
* * *        **  [name], [name]**    --- ---    34 Y old Male, DOB: May 28, 1950    [address], [place], [place] [address]    Home: [phone]    Provider: [name]        * * *    Telephone Encounter    ---    Answered by   [name]  Date: 07/18/2017         Time: 06:58 AM    Action Taken                      [name]  07/18/2017 6:58:41 AM > pls let him know- dilantin level- excellent at 16- let's repeat in 1 month-2 month range many thanks JO       [name],[name]  07/18/2017 8:20:24 AM > wants to know if you are going to be uping his Dilantin medication.       thanks [name]  07/18/2017 8:31:46 AM > he should take what he is taking- level is ok/ did he miss some doses accounting for low level previously ? JO      [name],[name]  07/18/2017 9:34:18 AM > he say that yoy guys disscused increasing his dose. but i will let him know you want to keep the same dose. thanks [name]  07/18/2017 9:43:40 AM > I wrote script so he can take higher 30 mg tablets on Dilantin- but am concerned level = perfect after we focussed on all this- so am wondering if he missed doses or not/ we can increase + 30 mg if he wants/ wrote scripts so he CAN do that if he wishes thx [name]  07/18/2017 9:45:25 AM > okay will let patient know. thanks Dez        --- ---                * * *                ---          * * *          Patient: [name] [name], [name] [name] DOB: 05-13-1950 Provider: [name] 07/18/2017    ---    Note generated by eClinicalWorks EMR/PM Software ([email or web address])

## 2017-07-27 ENCOUNTER — Ambulatory Visit: Admitting: Neurology

## 2017-07-27 NOTE — Progress Notes (Signed)
* * *        **[name] [name], [name] [name]**    --- ---    69 Y old Male, DOB: 22-Jun-1950    [address], [place], [place] [address]    Home: [phone]    Provider: [name]        * * *    Telephone Encounter    ---    Answered by   Charyl Dancer  Date: 07/27/2017         Time: 01:43 PM    Caller   [name]    --- ---            Reason   Medication            Message                      Hi,      Patient says his  medication was supposed to be updated to 500mg   a day (Dilantin 100 mg Capsule) he just picked  it up from the pharmacy and it has not been updated. Please have Dr. [name] make the changes and call patient back at [phone].      Thank you                 Action Taken                      Frimpong,Eric  07/27/2017 1:48:04 PM > Rekha Hobbins      Dorlene Footman  07/27/2017 5:54:40 PM > I increased to 400 mg /day for now since level was excellent- 400 + the 30's I put through should give him enough to increase if follow up levels suggest that needs to be the case/ pls let him know many thanks J O      Solomon,Justin S 07/30/2017 3:10:35 PM > Spoke to Dr. [name] - patient's Dilantin level was fine so he wants the patient to continue on the current dose. I will reach out to the patient to let him know.      Solomon,Justin S 07/30/2017 3:17:10 PM > I called the patient - he says that he had a seizure on 07/02/17 and was taken to a hospital where they increased his dose to 560 mg of Dilantin. His level at that time in the hospital was 10.9. His level here at [facility] on 07/17/17 was 16.2. It is possible this level of 16.2 was a result of his increase in dose to 560 mg because he says he has been taking 560 mg since 07/02/17. I told him I would relay the message to Dr. [name] to see if he would like to send a new prescription for 500 mg a day and get back to him. I put in a script for 500 mg Dilantin a day, please send to pharmacy above if you are OK with this (please select [facility]). Send message back to me if you would like me to get  back in touch with him.                Refills  Continue Dilantin Capsule, 100 mg, Orally- NAME BRAND DAW MEDICALLY  NECESSARY, 450, TAKE 5 CAPSULES BY MOUTH EVERY MORNING WITH THE 30MG , 3  months, Refills=3    --- ---          * * *                ---          * * *  Patient: [name] [name], [name] [name] DOB: [birth year] Provider: [name] 07/27/2017    ---    Note generated by eClinicalWorks EMR/PM Software ([email or web address])

## 2017-07-31 ENCOUNTER — Ambulatory Visit: Admitting: Neurology

## 2017-07-31 MED ORDER — Dilantin: 100 | 450 | 3 refills | 0 days | Status: AC

## 2017-07-31 NOTE — Progress Notes (Signed)
* * *        **  [name], [name]**    --- ---    80 Y old Male, DOB: [birth year]    [address] [place], [place] [address]    Home: [phone]    Provider: [name]        * * *    Telephone Encounter    ---    Answered by   [name]  Date: 07/31/2017         Time: 08:37 AM    Refills  Refill Dilantin Capsule, 100 mg, Orally- NAME BRAND DAW MEDICALLY  NECESSARY, 450, TAKE 5 CAPSULES BY MOUTH EVERY MORNING WITH THE 30MG , 90 days,  Refills=3    --- ---          * * *                ---          * * *          Patient: [name], [name] DOB: 11/24/1950 Provider: [name] 07/31/2017    ---    Note generated by eClinicalWorks EMR/PM Software ([email or web address])

## 2017-09-12 ENCOUNTER — Ambulatory Visit: Admit: 2017-09-12 | Payer: Medicare PPO

## 2017-09-12 ENCOUNTER — Ambulatory Visit: Admitting: Neurology

## 2017-09-12 DIAGNOSIS — ? RESIDUAL HEMORRHOIDAL SKI: Secondary | ICD-10-CM

## 2017-09-12 DIAGNOSIS — G40909 Epilepsy, unspecified, not intractable, without status epilepticus: Principal | ICD-10-CM

## 2017-09-12 LAB — HX TOXICOLOGY-TDM: HX PHENYTOIN (DILANTIN), TOTAL: 14.2 ug/mL (ref 10.0–20.0)

## 2017-09-12 NOTE — Progress Notes (Signed)
* * *        **[name], [name]**    --- ---    67 Y old Male, DOB: 1950-12-18, External MRN: [id number]    Account Number: [id number]    [address] [place], [place]    Home: [phone]    Insurance: N[employer] PPO    PCP: [name] Grout, MD Referring: [name] Grout, MD    Appointment Facility: Neurology        * * *    09/12/2017  Progress Notes: [name] [name] **CHN#:** [id number]    --- ---    ---         **Reason for Appointment**    ---       1\. RV    ---       **History of Present Illness**    ---     _GENERAL_ :    Dear Dr [name] [name] : Many thanks for referring [name] to [facility]  Neurology. He is a 67 year old patient with seizures. His histoy has been  extensively outlined- he has presumptive complex partial seizures/ has been  stable for quite some time although from time to time he has breakthrough  events- but ad interim states there are no issues, complaints, has refills,  wants lab work after discussion, takes seizure/safety precautions previously  extensively reviewed.       **Current Medications**    ---    Taking     * Aspirin 81 MG Tablet Chewable 1 tablet Orally Once a day    ---    * Atorvastatin Calcium 10 MG Tablet 1 tablet Orally Once a day    ---    * CoQ10     ---    * Dilantin 30 mg Capsule as directed Orally sig up to 3 tablets daily may undergo titration    ---    * Dilantin 100 mg Capsule TAKE 5 CAPSULES BY MOUTH EVERY MORNING WITH THE 30MG  Orally- NAME BRAND DAW MEDICALLY NECESSARY , Notes: 560 mg/day    ---    * Omeprazole 20 MG Capsule Delayed Release 1 capsule Orally Once a day    ---    * Potassium Chloride 20 MEQ Packet 1 packet with food Orally Once a day    ---    * Medication List reviewed and reconciled with the patient    ---       **Past Medical History**    ---       See notes- seizures/cardiovascular disease.        ---    Seizures.        ---       **Surgical History**    ---       No Surgical History documented.    ---        **Family History**    ---       No FH seizures.    ---       **Social History**    ---    Tobacco    history: _Never smoked_   no alcohol or illicits.    ---       **Allergies**    ---       N.K.D.A.    ---       **Hospitalization/Major Diagnostic Procedure**    ---       No Hospitalization History.    ---       **Review of Systems**    ---  _GENERAL_ :    Outpatient questionnaire reviewed.          **Vital Signs**    ---    Pain scale 0, Ht-in 5 ft 10 in.       **Physical Examination**    ---     _GENERAL_ :    General Appearance: unremarkable, Looks Healthy, well-developed, well-  hydrated, well-nourished, well-groomed, No dysmorphic features, no acute  distress, alert and oriented, pleasant.    Mood/Affect: pleasant.    HEENT Normocephalic, atraumatic. Sclera clear, conjunctiva pink.    Neck No carotid Bruits, supple.    Skin Normal.    Cardiovascular no carotid bruits, no carotid bruits, heart regular rate and  rhythm, extremities warm without swelling.    Lungs Clear to auscultation bilaterally, No Wheezes, ronchi, rales.    Extremities There was no clubbing, cyanosis or edema noted.    MSE : A/O    CN EOMI PERRLA    Motor 5/5 ambulatory.       **Assessments**    ---    1\. Seizure disorder - G40.909 (Primary)    ---      67 year old with extensively evaluated seizure disorder- recently medication  adjustment/ see chart/ previosuly extensive risk management discussions, all  questions answered, Seizure/safey/ precautions needs to follow RMV guidelines,  Dilantin level 6 month follow up [name] Bryant MD.    ---       **Treatment**    ---       **1\. Seizure disorder**    _LAB: Phenytoin (Dilantin)_    ---       **Follow Up**    ---    6 Months    Electronically signed by [name] [name] , MD on 09/12/2017 at 01:46 PM EDT    Sign off status: Completed        * * *        Neurology    [address]    [place], [address]    [place], [place] [address]    Tel: [phone]    Fax: [phone]              * * *           Patient: [name] [name], [name] [name] DOB: Jul 21, 1950 Progress Note: [name] [name] 09/12/2017    ---    Note generated by eClinicalWorks EMR/PM Software ([email or web address])

## 2017-09-12 NOTE — Progress Notes (Signed)
.    Progress Notes  .  Patient: [name] [name], [name] [name]  Provider: [name]    .  DOB: 06/06/1950 Age: 67 Y Sex: Male  .  PCP: Alessandra Grout MD  Date: 09/12/2017  .  --------------------------------------------------------------------------------  .  REASON FOR APPOINTMENT  .  1. RV  .  HISTORY OF PRESENT ILLNESS  .  GENERAL:   Dear Dr Marin Shutter : Many thanks for referring Mr Deroche to [facility]. He is a 67 year old patient with  seizures. His histoy has been extensively outlined- he has  presumptive complex partial seizures/ has been stable for quite  some time although from time to time he has breakthrough events-  but ad interim states there are no issues, complaints, has  refills, wants lab work after discussion, takes seizure/safety  precautions previously extensively reviewed.  .  CURRENT MEDICATIONS  .  Taking Aspirin 81 MG Tablet Chewable 1 tablet Orally Once a day  Taking Atorvastatin Calcium 10 MG Tablet 1 tablet Orally Once a  day  Taking CoQ10  Taking Dilantin 30 mg Capsule as directed Orally sig up to 3  tablets daily may undergo titration  Taking Dilantin 100 mg Capsule TAKE 5 CAPSULES BY MOUTH EVERY  MORNING WITH THE 30MG  Orally- NAME BRAND DAW MEDICALLY NECESSARY  , Notes: 560 mg/day  Taking Omeprazole 20 MG Capsule Delayed Release 1 capsule Orally  Once a day  Taking Potassium Chloride 20 MEQ Packet 1 packet with food Orally  Once a day  Medication List reviewed and reconciled with the patient  .  PAST MEDICAL HISTORY  .  See notes- seizures/cardiovascular disease  Seizures  .  ALLERGIES  .  N.K.D.A.  .  SURGICAL HISTORY  .  No Surgical History documented.  Marland Kitchen  FAMILY HISTORY  .  No FH seizures.  .  SOCIAL HISTORY  .  .  Tobacco  history:Never smoked  .  no alcohol or illicits.  .  HOSPITALIZATION/MAJOR DIAGNOSTIC PROCEDURE  .  No Hospitalization History.  Marland Kitchen  REVIEW OF SYSTEMS  .  GENERAL:  .  Outpatient questionnaire reviewed.  Marland Kitchen  VITAL SIGNS  .  Pain scale 0, Ht-in 5 ft 10  in.  Marland Kitchen  PHYSICAL EXAMINATION  .  GENERAL:  General Appearance:  unremarkable, Looks Healthy, well-developed,  well-hydrated, well-nourished, well-groomed, No dysmorphic  features, no acute distress, alert and oriented, pleasant.  Mood/Affect:  pleasant.  HEENT  Normocephalic, atraumatic. Sclera clear, conjunctiva pink.  Neck  No carotid Bruits, supple.  Skin  Normal.  Cardiovascular  no carotid bruits, no carotid bruits, heart  regular rate and rhythm, extremities warm without swelling.  Lungs  Clear to auscultation bilaterally, No Wheezes, ronchi,  rales.  Extremities  There was no clubbing, cyanosis or edema noted.  MSE : A/OCN EOMI PERRLAMotor 5/5 ambulatory.  .  ASSESSMENTS  .  Seizure disorder - G40.909 (Primary)  .  67 year old with extensively evaluated seizure disorder- recently  medication adjustment/ see chart/ previosuly extensive risk  management discussions, all questions answered, Seizure/safey/  precautions needs to follow RMV guidelines, Dilantin level 6  month follow up [name].  .  TREATMENT  .  Seizure disorder  LAB: Phenytoin (Dilantin)  .  FOLLOW UP  .  6 Months  .  Electronically signed by [name] , MD on  09/12/2017 at 01:46 PM EDT  .  Document electronically signed by [name]    .

## 2017-09-12 NOTE — Progress Notes (Signed)
* * *        **  [name], [name]**    --- ---    27 Y old Male, DOB: 01/18/1951    [address] [place], [place] [address]    Home: [phone]    Provider: [name]        * * *    Telephone Encounter    ---    Answered by   [name]  Date: 09/12/2017         Time: 02:45 PM    Action Taken                      Madilyne Tadlock  09/12/2017 2:46:01 PM > psl let him know- dilantin level in range 14.1- should continue as he is doing/ see him 6 months many thanks JO       [name],[name]  09/13/2017 8:27:03 AM > called and let him know thanks         --- ---                * * *                ---          * * *          Patient: [name], [name] DOB: 06-Dec-1950 Provider: [name] 09/12/2017    ---    Note generated by eClinicalWorks EMR/PM Software ([email or web address])

## 2017-09-24 NOTE — Progress Notes (Signed)
[name] [address] [place], [place] [address] Phone: [phone] Subjective:     CC: Left foot pain  JAS:[id number]  Brandon [name] is a 67 y.o. male coming in with complaint of left foot pain. Has had an injection. Lasted about a month. Has been wearing a plantar fascia brace. Doesn't wear sneakers. Wears crocs a lot and other flip flops. On his feet a lot.   Onset- Chronic  Location- Heel Character- Achy Aggravating factors- Standing Reliving factors-nothing Therapies tried-given injection and icing regimen.  Seen another provider and was told to get different shoes but has not done it. Severity-6 out of 10     Past Medical History:  Diagnosis Date  . Allergy    mild  . Aortic aneurysm (HCC)    small and watching  . GERD (gastroesophageal reflux disease)   . Head and neck cancer    head and neck ca dx 03/2009  . Hyperlipidemia   . Hypertension   . oropharyngeal ca dx'd 03/2009   chemo/xrt comp 06/2009  . Thyroid disease    Past Surgical History:  Procedure Laterality Date  . HERNIA REPAIR     inguinal  . hip replacemnt left Left   . Left eye    . stmach surgery     repair peg tube site   Social History   Socioeconomic History  . Marital status: Married    Spouse name: Not on file  . Number of children: 2  . Years of education: 76  . Highest education level: Not on file  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 History  . 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 Financial resource strain: Not on file  . Food insecurity:    Worry: Not on file    Inability: Not on file  . Transportation needs:    Medical: Not on file    Non-medical: Not on file  Tobacco Use  . Smoking status: Former Smoker    Packs/day: 1.50    Years: 35.00    Pack years: 52.50    Types: Cigarettes, Cigars    Start date: 05/16/1973    Last attempt to quit: 04/19/2009    Years since quitting: 8.4  . Smokeless tobacco: Never Used  . Tobacco  comment: quit smoking 5 years sago  Substance and Sexual Activity  . Alcohol use: Yes    Alcohol/week: 9.0 oz    Types: 15 Shots of liquor per week  . Drug use: No  . Sexual activity: Yes    Partners: Female  Lifestyle  . Physical activity:    Days per week: Not on file    Minutes per session: Not on file  . Stress: Not on file  Relationships  . Social connections:    Talks on phone: Not on file    Gets together: Not on file    Attends religious service: Not on file    Active member of club or organization: Not on file    Attends meetings of clubs or organizations: Not on file    Relationship status: Not on file  Other Topics Concern  . Not on file  Social History Narrative   HSG, [employer]-no degree. married - '76 - 5 years, divorced; remarried  '84. 1 son - '85; 1 daughter '89. 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    Allergies  Allergen Reactions  . Benicar [Olmesartan]     fatigue  . Amlodipine Swelling  Family History  Problem Relation Age of Onset  . Cancer Mother        lymphoma  . Colon cancer Mother   . Cancer Father        lung (smoker)  . Hyperlipidemia Father   . Hypertension Father   . Diabetes Father   . Esophageal cancer Neg Hx   . Rectal cancer Neg Hx   . Stomach cancer Neg Hx      Past medical history, social, surgical and family history all reviewed in electronic medical record.  No pertanent information unless stated regarding to the chief complaint.   Review of Systems:Review of systems updated and as accurate as of 09/25/17  No headache, visual changes, nausea, vomiting, diarrhea, constipation, dizziness, abdominal pain, skin rash, fevers, chills, night sweats, weight loss, swollen lymph nodes, body aches, joint swelling,  chest pain, shortness of breath, mood changes.  Positive muscle aches  Objective  Blood pressure 126/84, pulse 70, height 5\' 10"  (1.778 m), weight 178 lb (80.7 kg), SpO2 95 %. Systems examined below as  of 09/25/17   General: No apparent distress alert and oriented x3 mood and affect normal, dressed appropriately.  HEENT: Pupils equal, extraocular movements intact  Respiratory: Patient's speak in full sentences and does not appear short of breath  Cardiovascular: No lower extremity edema, non tender, no erythema  Skin: Warm dry intact with no signs of infection or rash on extremities or on axial skeleton.  Abdomen: Soft nontender  Neuro: Cranial nerves II through XII are intact, neurovascularly intact in all extremities with 2+ DTRs and 2+ pulses.  Lymph: No lymphadenopathy of posterior or anterior cervical chain or axillae bilaterally.  Gait mild antalgic MSK:  Non tender with full range of motion and good stability and symmetric strength and tone of shoulders, elbows, wrist, hip, knee bilaterally.  Ankle: Left No visible erythema or swelling. Range of motion is full in all directions. Strength is 5/5 in all directions. Stable lateral and medial ligaments; squeeze test and kleiger test unremarkable; Talar dome nontender; Mild discomfort more of the Achilles and the posterior capsule. Able to walk 4 steps. Contralateral ankle unremarkable  MSK US performed of: Left ankle This study was ordered, performed, and interpreted by Charlann Boxer D.O.  Foot/Ankle:   All structures visualized.   Talar dome unremarkable  Ankle mortise without effusion.  Mild degenerative changes Peroneus longus and brevis tendons unremarkable on long and transverse views without sheath effusions. Posterior tibialis, flexor hallucis longus, and flexor digitorum longus tendons unremarkable on long and transverse views without sheath effusions. Achilles tendon has some very mild inflammation at its insertion.  Calcium deposits noted. Anterior Talofibular Ligament and Calcaneofibular Ligaments unremarkable and intact. Deltoid Ligament unremarkable and intact. Plantar fascia intact and without effusion, normal  thickness. No increased doppler signal, cap sign, or thickening of tibial cortex.   IMPRESSION: Calcific Achilles tendinitis  [address]7110; 15 additional minutes spent for Therapeutic exercises as stated in above notes.  This included exercises focusing on stretching, strengthening, with significant focus on eccentric aspects.   Long term goals include an improvement in range of motion, strength, endurance as well as avoiding reinjury. Patient's frequency would include in 1-2 times a day, 3-5 times a week for a duration of 6-12 weeks. Ankle strengthening that included:  Basic range of motion exercises to allow proper full motion at ankle Stretching of the lower leg and hamstrings  Theraband exercises for the lower leg - inversion, eversion, dorsiflexion and plantarflexion each to  be completed with a theraband Balance exercises to increase proprioception Weight bearing exercises to increase strength and balance  Proper technique shown and discussed handout in great detail with ATC.  All questions were discussed and answered.       Impression and Recommendations:     This case required medical decision making of moderate complexity.      Note: This dictation was prepared with Dragon dictation along with smaller phrase technology. Any transcriptional errors that result from this process are unintentional.

## 2017-09-25 ENCOUNTER — Ambulatory Visit: Payer: Self-pay

## 2017-09-25 ENCOUNTER — Telehealth: Payer: Self-pay | Admitting: Internal Medicine

## 2017-09-25 ENCOUNTER — Ambulatory Visit (INDEPENDENT_AMBULATORY_CARE_PROVIDER_SITE_OTHER): Payer: Medicare Other | Admitting: Family Medicine

## 2017-09-25 ENCOUNTER — Encounter: Payer: Self-pay | Admitting: Family Medicine

## 2017-09-25 ENCOUNTER — Other Ambulatory Visit: Payer: Self-pay | Admitting: Internal Medicine

## 2017-09-25 VITALS — BP 126/84 | HR 70 | Ht 70.0 in | Wt 178.0 lb

## 2017-09-25 DIAGNOSIS — M7662 Achilles tendinitis, left leg: Secondary | ICD-10-CM | POA: Insufficient documentation

## 2017-09-25 DIAGNOSIS — M79672 Pain in left foot: Secondary | ICD-10-CM

## 2017-09-25 DIAGNOSIS — F5101 Primary insomnia: Secondary | ICD-10-CM

## 2017-09-25 MED ORDER — VITAMIN D (ERGOCALCIFEROL) 1.25 MG (50000 UNIT) PO CAPS: 50000.0000 [IU] | ORAL_CAPSULE | ORAL | 0 refills | Status: DC

## 2017-09-25 NOTE — Assessment & Plan Note (Signed)
Calcific Achilles tendinitis.  Once weekly vitamin D given, topical anti-inflammatories, athletic trainer gave home exercises discussed topical anti-inflammatories.  Follow-up again in 4 weeks

## 2017-09-25 NOTE — Patient Instructions (Signed)
Good to see you.  Ice 20 minutes 2 times daily. Usually after activity and before bed. pennsaid pinkie amount topically 2 times daily as needed.  Heel lift in the shoe at all times Good shoes with rigid bottom.  Jalene Mullet, Merrell or New balance greater then 700 Once weekly vitamin D to help the bone toughen up  Exercises 3 times a week.   See me again in 4 weeks

## 2017-09-25 NOTE — Telephone Encounter (Signed)
Patient is requesting a letter stating that it is ok for the patient to error

## 2017-09-27 ENCOUNTER — Other Ambulatory Visit: Payer: Self-pay | Admitting: Family Medicine

## 2017-09-27 ENCOUNTER — Encounter (HOSPITAL_COMMUNITY): Payer: Medicare Other

## 2017-09-27 DIAGNOSIS — M79672 Pain in left foot: Secondary | ICD-10-CM

## 2017-10-01 ENCOUNTER — Other Ambulatory Visit: Payer: Self-pay | Admitting: Family Medicine

## 2017-10-01 ENCOUNTER — Ambulatory Visit (HOSPITAL_COMMUNITY)
Admission: RE | Admit: 2017-10-01 | Discharge: 2017-10-01 | Disposition: A | Discharge status: Home / Self Care | Payer: Medicare Other | Source: Ambulatory Visit | Attending: Cardiology | Admitting: Cardiology

## 2017-10-01 DIAGNOSIS — Z87891 Personal history of nicotine dependence: Secondary | ICD-10-CM | POA: Insufficient documentation

## 2017-10-01 DIAGNOSIS — L659 Nonscarring hair loss, unspecified: Secondary | ICD-10-CM

## 2017-10-01 DIAGNOSIS — R234 Changes in skin texture: Secondary | ICD-10-CM | POA: Diagnosis not present

## 2017-10-01 DIAGNOSIS — E785 Hyperlipidemia, unspecified: Secondary | ICD-10-CM | POA: Diagnosis not present

## 2017-10-01 DIAGNOSIS — M79672 Pain in left foot: Secondary | ICD-10-CM

## 2017-10-01 DIAGNOSIS — I10 Essential (primary) hypertension: Secondary | ICD-10-CM | POA: Diagnosis not present

## 2017-10-16 ENCOUNTER — Other Ambulatory Visit: Payer: Self-pay | Admitting: Internal Medicine

## 2017-10-16 DIAGNOSIS — E039 Hypothyroidism, unspecified: Secondary | ICD-10-CM

## 2017-10-26 ENCOUNTER — Ambulatory Visit: Payer: Medicare Other | Admitting: Family Medicine

## 2017-10-29 ENCOUNTER — Ambulatory Visit: Payer: Medicare Other | Admitting: Internal Medicine

## 2017-10-30 NOTE — Progress Notes (Signed)
[name] [place], [place] [address] Phone: [phone] Subjective:      CC: Foot pain follow-up  YJE:[id number]  Brandon [name] is a 67 y.o. male coming in with complaint of left foot pain. Was able to go to [place] and do a lot of walking. Was painful yesterday with the rainy weather. Pain over the ankle mortise and base of the 5th. Did buy new pair of shoes and heel lifts.  Patient states that overall probably 60 to 70% better.     Past Medical History:  Diagnosis Date  . Allergy    mild  . Aortic aneurysm (HCC)    small and watching  . GERD (gastroesophageal reflux disease)   . Head and neck cancer    head and neck ca dx 03/2009  . Hyperlipidemia   . Hypertension   . oropharyngeal ca dx'd 03/2009   chemo/xrt comp 06/2009  . Thyroid disease    Past Surgical History:  Procedure Laterality Date  . HERNIA REPAIR     inguinal  . hip replacemnt left Left   . Left eye    . stmach surgery     repair peg tube site   Social History   Socioeconomic History  . Marital status: Married    Spouse name: Not on file  . Number of children: 2  . Years of education: 33  . Highest education level: Not on file  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 History  . 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 Financial resource strain: Not on file  . Food insecurity:    Worry: Not on file    Inability: Not on file  . Transportation needs:    Medical: Not on file    Non-medical: Not on file  Tobacco Use  . Smoking status: Former Smoker    Packs/day: 1.50    Years: 35.00    Pack years: 52.50    Types: Cigarettes, Cigars    Start date: 05/16/1973    Last attempt to quit: 04/19/2009    Years since quitting: 8.5  . Smokeless tobacco: Never Used  . Tobacco comment: quit smoking 5 years sago  Substance and Sexual Activity  . Alcohol use: Yes    Alcohol/week: 9.0 oz    Types: 15 Shots of liquor per week  . Drug use: No  . Sexual  activity: Yes    Partners: Female  Lifestyle  . Physical activity:    Days per week: Not on file    Minutes per session: Not on file  . Stress: Not on file  Relationships  . Social connections:    Talks on phone: Not on file    Gets together: Not on file    Attends religious service: Not on file    Active member of club or organization: Not on file    Attends meetings of clubs or organizations: Not on file    Relationship status: Not on file  Other Topics Concern  . Not on file  Social History Narrative   HSG, [employer]-no degree. married - '76 - 5 years, divorced; remarried  '84. 1 son - '85; 1 daughter '89. 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    Allergies  Allergen Reactions  . Benicar [Olmesartan]     fatigue  . Amlodipine Swelling   Family History  Problem Relation Age of Onset  . Cancer Mother        lymphoma  .  Colon cancer Mother   . Cancer Father        lung (smoker)  . Hyperlipidemia Father   . Hypertension Father   . Diabetes Father   . Esophageal cancer Neg Hx   . Rectal cancer Neg Hx   . Stomach cancer Neg Hx      Past medical history, social, surgical and family history all reviewed in electronic medical record.  No pertanent information unless stated regarding to the chief complaint.   Review of Systems:Review of systems updated and as accurate as of 10/31/17  No headache, visual changes, nausea, vomiting, diarrhea, constipation, dizziness, abdominal pain, skin rash, fevers, chills, night sweats, weight loss, swollen lymph nodes, body aches, joint swelling,  chest pain, shortness of breath, mood changes.  Positive muscle aches  Objective  Blood pressure 120/90, pulse (!) 58, height 5\' 10"  (1.778 m), weight 179 lb (81.2 kg), SpO2 98 %. Systems examined below as of 10/31/17   General: No apparent distress alert and oriented x3 mood and affect normal, dressed appropriately.  HEENT: Pupils equal, extraocular movements intact    Respiratory: Patient's speak in full sentences and does not appear short of breath  Cardiovascular: No lower extremity edema, non tender, no erythema  Skin: Warm dry intact with no signs of infection or rash on extremities or on axial skeleton.  Abdomen: Soft nontender  Neuro: Cranial nerves II through XII are intact, neurovascularly intact in all extremities with 2+ DTRs and 2+ pulses.  Lymph: No lymphadenopathy of posterior or anterior cervical chain or axillae bilaterally.  Gait normal with good balance and coordination.  MSK:  Non tender with full range of motion and good stability and symmetric strength and tone of shoulders, elbows, wrist,  knee bilaterally.  Moderate arthritic changes of multiple joints Left hip replacement noted  Patient's left ankle has some mild tightness of the posterior cord.  Significant decrease in swelling from previous exam around the insertion of the Achilles on the calcaneal region.  Minimal tender in the area still noted.  Patient's ankle otherwise fairly unremarkable.    Impression and Recommendations:     This case required medical decision making of moderate complexity.      Note: This dictation was prepared with Dragon dictation along with smaller phrase technology. Any transcriptional errors that result from this process are unintentional.

## 2017-10-31 ENCOUNTER — Other Ambulatory Visit: Payer: Self-pay

## 2017-10-31 ENCOUNTER — Ambulatory Visit: Payer: Self-pay

## 2017-10-31 ENCOUNTER — Ambulatory Visit (INDEPENDENT_AMBULATORY_CARE_PROVIDER_SITE_OTHER): Payer: Medicare Other | Admitting: Family Medicine

## 2017-10-31 ENCOUNTER — Encounter: Payer: Self-pay | Admitting: Family Medicine

## 2017-10-31 VITALS — BP 120/90 | HR 58 | Ht 70.0 in | Wt 179.0 lb

## 2017-10-31 DIAGNOSIS — M7662 Achilles tendinitis, left leg: Secondary | ICD-10-CM | POA: Diagnosis not present

## 2017-10-31 DIAGNOSIS — M79606 Pain in leg, unspecified: Secondary | ICD-10-CM | POA: Diagnosis not present

## 2017-10-31 DIAGNOSIS — M79672 Pain in left foot: Secondary | ICD-10-CM

## 2017-10-31 MED ORDER — DUEXIS 800-26.6 MG PO TABS: ORAL_TABLET | ORAL | 2 refills | Status: DC

## 2017-10-31 NOTE — Patient Instructions (Signed)
Good to see you  You are making progress.  Ice is you friend.  PT will calling you  Keep it up  See me again in 4 weeks

## 2017-10-31 NOTE — Assessment & Plan Note (Signed)
Improvement noted discussed icing regimen, discussed topical anti-inflammatories.  Discussed proper bracing and continuing the heel lift.  Patient will continue the home exercises on his own and we encourage patient to consider formal physical therapy.  Follow-up again in 4 to 6 weeks

## 2017-11-12 ENCOUNTER — Ambulatory Visit: Admitting: Neurology

## 2017-11-12 ENCOUNTER — Encounter: Payer: Self-pay | Admitting: Family Medicine

## 2017-11-12 NOTE — Progress Notes (Signed)
* * *        **  [name], [name] [name]**    --- ---    74 Y old Male, DOB: 30-Dec-1950    [address], [place], [place] [address]    Home: [phone]    Provider: [name]        * * *    Telephone Encounter    ---    Answered by   Magdalene Patricia  Date: 11/12/2017         Time: 04:09 PM    Caller   Modesto Charon    --- ---            Reason   Not feeling well/Dilantin            Message                      Hi,             Modesto Charon, PT is requesting a CB from Dr. [name]. He stated that he's feeling a bit off and would like to know if he needs to see Dr. [name] sooner than his upcoming APPT on 03/18/18. He's requesting to speak with someone before his APPT. He stated that he might need to adjust his dilantin level , but want to speak with Dr. [name] before making that decision.             Thank you,                 Action Taken                      [facility]  11/12/2017 4:13:09 PM >       Oyindamola Key  11/13/2017 8:06:42 AM > Corrie Dandy- we have been in this cycle with this patient many times with erratic dilantin levels- perhaps we might discuss changing from dilantin at some point, but ok to get levels/triage/and so forth thanks [name]  11/14/2017 2:03:16 PM > called the patient at the number above.  reports he has been  feeling foggy and unsteady.  was taking 530mg  Dilantin daily until this morning when he decreased his dose to 500mg   He can go to [facility] this afternoon for blood work.  Order prepped, being faxed.                      * * *              * * *        ---        Reason for Appointment    ---      1\. Not feeling well/Dilantin    ---      Assessments    ---    1\. Seizure disorder - G40.909    ---      Treatment    ---       **1\. Seizure disorder**    _LAB: Phenytoin (Dilantin)_     Dilantin level PRN as indicated please fax  results to [phone]    --- ---          * * *          Patient: [name], [name] [name] DOB: October 20, 1950 Provider: [name] 11/12/2017    ---    Note  generated by eClinicalWorks EMR/PM Software ([email or web address])

## 2017-11-14 ENCOUNTER — Ambulatory Visit: Admitting: Neurology

## 2017-11-14 NOTE — Progress Notes (Signed)
* * *        **  [name], [name]**    --- ---    58 Y old Male, DOB: 1951-02-08    [address] [place], [place] [address]    Home: [phone]    Provider: [name]        * * *    Telephone Encounter    ---    Answered by   Charyl Dancer  Date: 11/14/2017         Time: 03:47 PM    Caller   [name]    --- ---            Reason   Blood work            Message                      Hi,      Patient is calling to inform the office that he just got his blood work done at the [facility] in [place] and the results should be in soon. Please call him back at [phone] when the results become available.      Thank you                 Action Taken                      Frimpong,Eric  11/14/2017 3:49:55 PM >       Arlie Riker  11/15/2017 3:35:08 PM > anyway you can try to call to find out what Dilantin is- have not heard from them yet/ or can get lab # from patient thx [name]  11/16/2017 2:12:32 PM > called and let him know he is going to have them resend it to [place] VIA fax i will let you know once we get them. thanks Dez                    * * *                ---          * * *          Patient: [name] [name], [name] [name] DOB: 12-06-1950 Provider: [name] 11/14/2017    ---    Note generated by eClinicalWorks EMR/PM Software ([email or web address])

## 2017-11-16 ENCOUNTER — Encounter: Payer: Self-pay | Admitting: Internal Medicine

## 2017-11-19 ENCOUNTER — Ambulatory Visit

## 2017-11-19 ENCOUNTER — Ambulatory Visit: Admitting: Neurology

## 2017-11-19 NOTE — Progress Notes (Signed)
* * *        **  [name], [name]**    --- ---    48 Y old Male, DOB: Oct 11, 1950    [address] [place], [place] [address]    Home: [phone]    Provider: [name]        * * *    Telephone Encounter    ---    Answered by   Pryor Curia  Date: 11/19/2017         Time: 11:39 AM    Caller   pt    --- ---            Reason   Needs call back from MD            Message                      pt requesting a call back regarding blood work                 Action Taken                      [name],[name]  11/19/2017 11:40:59 AM > can you pls see if results are in thx JO       [name],[name]  11/20/2017 9:32:39 AM > lab results are in patient docs      [name]  11/20/2017 9:36:27 AM > lab= 32-       [name],[name]  11/20/2017 10:19:12 AM > Pt is awaiting call back in regards to resutls.       [name]  11/20/2017 10:35:19 AM > was on 530/day now at 500- go to 400 mg/day JO      [name]  11/20/2017 10:36:31 AM > [name] please book repeat dilantin level Friday G40.909 may need to print this / I can sign then fax thanks JO       [name],[name]  11/22/2017 9:40:19 AM > left in your door to be signed.      fax [phone]      [name]  11/22/2017 11:51:07 AM > signed [name]                    * * *                ---          * * *          Patient: [name], [name] DOB: 11/15/1950 Provider: [name] 11/19/2017    ---    Note generated by eClinicalWorks EMR/PM Software ([email or web address])

## 2017-11-20 ENCOUNTER — Other Ambulatory Visit: Payer: Self-pay

## 2017-11-20 ENCOUNTER — Ambulatory Visit: Payer: Medicare Other | Attending: Family Medicine | Admitting: Physical Therapy

## 2017-11-20 ENCOUNTER — Encounter: Payer: Self-pay | Admitting: Physical Therapy

## 2017-11-20 DIAGNOSIS — M25672 Stiffness of left ankle, not elsewhere classified: Secondary | ICD-10-CM

## 2017-11-20 DIAGNOSIS — M25572 Pain in left ankle and joints of left foot: Secondary | ICD-10-CM | POA: Diagnosis present

## 2017-11-20 DIAGNOSIS — M79662 Pain in left lower leg: Secondary | ICD-10-CM

## 2017-11-20 DIAGNOSIS — R262 Difficulty in walking, not elsewhere classified: Secondary | ICD-10-CM | POA: Diagnosis not present

## 2017-11-20 NOTE — Patient Instructions (Signed)
Trigger Point Dry Needling  . What is Trigger Point Dry Needling (DN)? o DN is a physical therapy technique used to treat muscle pain and dysfunction. Specifically, DN helps deactivate muscle trigger points (muscle knots).  o A thin filiform needle is used to penetrate the skin and stimulate the underlying trigger point. The goal is for a local twitch response (LTR) to occur and for the trigger point to relax. No medication of any kind is injected during the procedure.   . What Does Trigger Point Dry Needling Feel Like?  o The procedure feels different for each individual patient. Some patients report that they do not actually feel the needle enter the skin and overall the process is not painful. Very mild bleeding may occur. However, many patients feel a deep cramping in the muscle in which the needle was inserted. This is the local twitch response.   Marland Kitchen How Will I feel after the treatment? o Soreness is normal, and the onset of soreness may not occur for a few hours. Typically this soreness does not last longer than two days.  o Bruising is uncommon, however; ice can be used to decrease any possible bruising.  o In rare cases feeling tired or nauseous after the treatment is normal. In addition, your symptoms may get worse before they get better, this period will typically not last longer than 24 hours.   . What Can I do After My Treatment? o Increase your hydration by drinking more water for the next 24 hours. o You may place ice or heat on the areas treated that have become sore, however, do not use heat on inflamed or bruised areas. Heat often brings more relief post needling. o You can continue your regular activities, but vigorous activity is not recommended initially after the treatment for 24 hours. o DN is best combined with other physical therapy such as strengthening, stretching, and other therapies.    See April Wilson PTA through [facility] on [place].  [phone] for orthotic  foot evaluation and shoe recommendation.      Achilles / Gastroc, Standing   Stand, right foot behind, heel on floor and turned slightly out, leg straight, forward leg bent. Move hips forward. Hold _30__ seconds. Repeat _3__ times per session. Do _3__ sessions per day.   Stretching: Soleus   Stand with right foot back, both knees bent. Keeping heel on floor, turned slightly out, lean into wall until stretch is felt in lower calf. Hold _30___ seconds. Repeat __3__ times per set. Do __3__ sessions per day.    Heel Raise: Unilateral (Standing)   Balance on left foot, then rise on ball of foot. Repeat _25___ times per set. Do __2__ sets per session. Do __1__ sessions per day.  Brandon [name], PT Certified Exercise Expert for the Aging Adult  11/20/17 8:21 AM Phone: [phone] Fax: [phone]

## 2017-11-20 NOTE — Therapy (Signed)
[place], [place], [address] Phone: [phone]   Fax:  [phone]  Physical Therapy Evaluation  Patient Details  Name: Brandon [name] MRN: [id number] Date of Birth: 07/07/1950 Referring Provider: Charlann Boxer DO   Encounter Date: 11/20/2017  PT End of Session - 11/20/17 0824    Visit Number  1    Number of Visits  6    Date for PT Re-Evaluation  01/01/18    Authorization Type  UHC    PT Start Time  0755    PT Stop Time  0800    PT Time Calculation (min)  5 min    Activity Tolerance  Patient tolerated treatment well    Behavior During Therapy  [facility] for tasks assessed/performed       Past Medical History:  Diagnosis Date  . Allergy    mild  . Aortic aneurysm (HCC)    small and watching  . GERD (gastroesophageal reflux disease)   . Head and neck cancer    head and neck ca dx 03/2009  . Hyperlipidemia   . Hypertension   . oropharyngeal ca dx'd 03/2009   chemo/xrt comp 06/2009  . Thyroid disease     Past Surgical History:  Procedure Laterality Date  . HERNIA REPAIR     inguinal  . hip replacemnt left Left   . Left eye    . stmach surgery     repair peg tube site    There were no vitals filed for this visit.   Subjective Assessment - 11/20/17 0800    Subjective  I am seven years past OOropharyngeal cancer Stage 4.  I am here because of my left heel pain, I am going [place] of the [place] September 8th.  I change shoes a couple a day. 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 I have bought lifts in my shoes which have helped    Pertinent History  GERD, Oorophyaryngeal CA,, left THA 8 years ago    Limitations  Standing;Walking;House hold activities    How long can you sit comfortably?  unlimited    How long can you stand comfortably?  standing most of the day     How long can you walk comfortably?   2 hours      Diagnostic tests  ultrasound dx    Patient Stated Goals  I want my heel to be healed so  I can remain active    Currently in Pain?  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   No    Is the patient reluctant to leave their home because of a fear of falling?   No      Home Film/video editor residence    Living Arrangements  Spouse/significant other      Prior Function   Level of Independence  Independent      Cognition   Overall Cognitive Status  Within Functional Limits for tasks assessed      Observation/Other Assessments   Focus on Therapeutic Outcomes (FOTO)   FOTO intake 73% limititation 27%  predicted 21%      Sensation   Light Touch  Appears Intact    Stereognosis  Appears Intact    Hot/Cold  Appears Intact    Proprioception  Appears Intact      Functional Tests   Functional tests  Squat;Single leg stance      ROM / Strength   AROM / PROM / Strength  AROM;Strength      AROM   Overall AROM   Deficits    Right Knee Extension  0    Right Knee Flexion  135    Left Knee Extension  0    Left Knee Flexion  140    Right Ankle Dorsiflexion  5     Right Ankle Plantar Flexion  55    Right Ankle Inversion  32    Right Ankle Eversion  25    Left Ankle Dorsiflexion  2    Left Ankle Plantar Flexion  44    Left Ankle Inversion  19    Left Ankle Eversion  12      Strength   Overall Strength  Deficits    Right Ankle Dorsiflexion  5/5    Right Ankle Plantar Flexion  5/5    Left Ankle Dorsiflexion  4-/5    Left Ankle Plantar Flexion  4/5      Palpation   Palpation comment  tenderness over medial heel with palpation over medial plantar fascia attachment at heel, pain over left cuboid                Objective measurements completed on examination: See above findings.      [facility] Adult PT Treatment/Exercise - 11/20/17 0829      Self-Care   Self-Care  Other Self-Care Comments    Other Self-Care Comments   given bil heel lifts for more comfortable walking during healing      Modalities   Modalities  Moist Heat      Moist Heat Therapy   Number Minutes Moist Heat  15 Minutes    Moist Heat Location  Other (comment)   left foot and heel     Manual Therapy   Manual Therapy  Soft tissue mobilization    Manual therapy comments  skilled palpation with TPDN    Soft tissue mobilization  plantar fascia and gastroc soleus left only    Kinesiotex  --   heel pad taping left foot     Ankle Exercises: Stretches   Plantar Fascia Stretch  2 reps;30 seconds   left   Plantar Fascia Stretch Limitations  VC and  TC    Soleus Stretch  2 reps;30 seconds   left   Soleus Stretch Limitations  VC and TC    Gastroc Stretch  2 reps;30 seconds   left   Gastroc Stretch Limitations  VC and TC      Ankle Exercises: Standing   Other Standing Ankle Exercises  25 x bilheel raise and 25 x left heel raise       Trigger Point Dry Needling - 11/20/17 0854    Consent Given?  Yes    Education Handout Provided  Yes    Muscles Treated Lower Body  Gastrocnemius;Quadratus plantae    Gastrocnemius Response  Palpable increased muscle length   left     Quadatus Plantae Response  Twitch response elicited;Palpable increased muscle length   left          PT Education - 11/20/17 3762    Education Details  POC Explanation of findings. HEP, Trigger Point dry needling    Person(s) Educated  Patient    Methods  Explanation;Demonstration;Tactile cues;Verbal cues;Handout    Comprehension  Verbalized understanding;Returned demonstration       PT Short Term Goals - 11/20/17 1414      PT SHORT TERM GOAL #1   Title  STG=LTG        PT Long Term Goals - 11/20/17 1414      PT LONG TERM GOAL #1   Title  Pt will be independent with advanced HEP    Baseline  no knowledge    Time  6    Period  Weeks    Status  New    Target Date  01/01/18      PT LONG TERM GOAL #2   Title  Pt will know how to use heel lifts and progress until no longer needs with heel pain    Baseline  PT given heel lifts at evaluation    Time  6    Period  Weeks    Status  New    Target Date  01/01/18      PT LONG TERM GOAL #3   Title  Pt will increas DF to at least 10 degrees to show improved flexibility of heel cord     Baseline  left 2 and Right 5    Time  6    Period  Weeks    Status  New    Target Date  01/01/18      PT LONG TERM GOAL #4   Title  "FOTO will improve from  27% limtation  to  21% limitaiton   indicating improved functional mobility.     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  Pt with decreased AROM of bil DF and muscle weakness.  Pt consents to TPDN and was closely monitored as well has given heel pad taping to aid in comfort.  Pt will benefit from short term skilled PT to address impairments. 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     History and  Personal Factors relevant to plan of care:  ooropharyngeal canceer stage 4 post 7 years,     Clinical Presentation  Stable    Clinical Decision Making  Low    Rehab Potential  Excellent    PT Frequency  1x / week    PT Duration  6 weeks    PT Treatment/Interventions  Dry needling;Taping;Passive range of motion;Iontophoresis 4mg /ml Dexamethasone;Electrical Stimulation;Cryotherapy;Moist Heat;Ultrasound;Therapeutic exercise;Therapeutic activities;Functional mobility training;Stair training;Gait training;Neuromuscular re-education;Patient/family education;Manual techniques    PT Next Visit Plan  assess TPDN and taping , add strength as needed    PT Home Exercise Plan  gastroc and soleus and plantar flex     Consulted and Agree with Plan of Care  Patient       Patient will benefit from skilled therapeutic intervention in order to improve the following deficits and impairments:  Difficulty walking, Pain, Decreased activity tolerance, Decreased range of motion, Decreased strength, Decreased mobility  Visit Diagnosis: Difficulty in walking, not elsewhere classified  Pain in left lower leg  Stiffness of left ankle, not elsewhere classified  Pain in left ankle and joints of left foot     Problem List Patient Active Problem List   Diagnosis Date Noted  . Achilles tendinitis, left leg 09/25/2017  . Benign prostatic hyperplasia without lower urinary tract symptoms 04/23/2017  . Hypomagnesemia 02/20/2017  . Localized edema 12/25/2016  . Screen for colon cancer 09/09/2014  . Hyperlipidemia with target LDL less than 130 09/09/2014  . Hypothyroidism 09/09/2014  . Thoracic aortic aneurysm without rupture ([place]) 03/13/2014  . Neuropathy due to chemotherapeutic drug ([place]) 09/08/2013  . Routine general medical examination at a health care facility 07/30/2013  . Other abnormal glucose 07/30/2013  . Insomnia 07/30/2013  . Essential hypertension, benign 07/30/2013  . CARCINOMA, SQUAMOUS CELL  04/28/2010  . GERD 02/11/2007    Voncille Lo, PT Certified Exercise Expert for the Aging Adult  11/20/17 6:03 PM Phone: [phone] Fax: [facility] [address] [place], [place], [address] Phone: [phone]   Fax:  [phone]  Name: Brandon [name] MRN: [id number] Date of Birth: 1950/10/10

## 2017-11-22 ENCOUNTER — Ambulatory Visit: Admitting: Neurology

## 2017-11-22 NOTE — Progress Notes (Signed)
* * *        **  [name], [name]**    --- ---    30 Y old Male, DOB: March 20, 1951    [address], [place], [place] [address]    Home: [phone]    Provider: [name]        * * *    Telephone Encounter    ---    Answered by   [name]  Date: 11/22/2017         Time: 02:11 PM    Caller   patient    --- ---            Reason   Dilantin level high was at 32            Action Taken                      [name]  11/22/2017 2:12:02 PM > was to get another level end of week- can you pls follow up with him [name]  11/22/2017 3:46:09 PM > Patient called again to see why the order was not faxed. He stated other docuents were fax but not the order to get his Dilantin levels tested. Please fax order to [facility] in [place]. He didn't have the fax number in hand while on the phone but he does have it, so if for some reason the office doesn't have it anymore call the patient. He asked to please call him once order has been fax, please do it before 5pm today so he doesn't go to tomorrows appointment for no reason. Thank you !       [name],[name]  11/23/2017 7:29:31 AM > sent this over 3 times now. called lab to get correct fax number. called patient and let him know we sent again. thanks Dez                    * * *                ---          * * *          Patient: [name] [name], [name] [name] DOB: [birth year] Provider: [name] 11/22/2017    ---    Note generated by eClinicalWorks EMR/PM Software ([email or web address])

## 2017-11-23 ENCOUNTER — Ambulatory Visit: Admitting: Neurology

## 2017-11-23 NOTE — Progress Notes (Signed)
* * *        **  [name], [name] [name]**    --- ---    39 Y old Male, DOB: [birth year]    [address] [place], [place] [address]    Home: [phone]    Provider: [name]        * * *    Telephone Encounter    ---    Answered by   [name]  Date: 11/23/2017         Time: 08:16 AM    Reason   lab order    --- ---            Message                      Hello      lab will not accept order we sent over. they state it needs to be a order cannot have other notes on it. please add labs under LAB/DI tab in this note and i can print form there. patient plans to go in a hour or so.      thanks Hinton Lovely      [phone]                Action Taken                      [name],[name]  11/23/2017 8:30:14 AM > taken care of. thanks                    * * *                ---          * * *          Patient: [name] [name] DOB: 21-Dec-1950 Provider: [name] 11/23/2017    ---    Note generated by eClinicalWorks EMR/PM Software ([email or web address])

## 2017-11-23 NOTE — Progress Notes (Signed)
* * *        **[name] [name], [name] [name]**    --- ---    47 Y old Male, DOB: [birth year]    [address], [place], [place] [address]    Home: [phone]    Provider: [name]        * * *    Telephone Encounter    ---    Answered by   [name]  Date: 11/23/2017         Time: 04:02 PM    Reason   high dilantin level    --- ---            Action Taken                      Somaya Grassi  11/23/2017 4:08:03 PM > Dilantin level = 28.6 - on 400 mg/day- will go to 330 mg/day - recheck level on monday- spoke with him thx [name]  11/28/2017 10:06:21 AM > no results in [facility].  Called patient at the number above.  Left a message asking for a call back.        Frimpong,Eric  11/28/2017 10:45:18 AM > Hi, patient is returning your call please call him again at [phone].       McIntosh,[name]  11/28/2017 11:35:10 AM > PT called again, the pt requested a text or email message. Tele: [phone] and email: [email or web address] .Renford Dills  11/28/2017 11:54:38 AM > patient had blood work done on Monday at [facility] in [place] at 10:30am  took his Dilantin 330mg  at 7:45am that morning.  called [facility]  Dilantin level      reported as 23.6. report being faxed again. My understanding from Dr. [name]. the on call md will manage as he is on vacation.        Darinda [place]  11/29/2017 9:26:26 AM > spoke with him feeling ok - rechecking level tomorrow will touch base tomorrow afternoon or later Junious Silk  11/30/2017 4:59:14 PM > spoke with him- had level drawn, but feels good, wants to stay at 330 mg /day diilantin, reassess next week, will get another blood draw Gretta Arab  12/03/2017 3:38:14 PM > can you pls let patient know- still no Dilantin level received, was checking in- if can be located pls attach / can send me back bean will check again this week many thanks JO      [name],[name]  12/04/2017 11:28:14 AM > called and he said that he will wait for your call back & he will make sure labs get  sent. thanks      Aedin Jeansonne  12/04/2017 6:56:50 PM > ok still no labs received- will cb when received thx JO      Sayaka Hoeppner  12/06/2017 7:42:18 AM >       Whit Bruni  12/06/2017 7:42:19 AM > Corrie Dandy- can you pls track this Dilantin level down/we can review plan if needed- if level ok he should simply stay where he is repeat in 2 weeks ok = (10-20 level) thx [name]  12/07/2017 9:10:16 AM > Please contact lab where patient gets drawn to have results sent to [place] and then send bean back to me   tx.       [name],[name]  12/07/2017 9:46:11 AM >       Charyl Dancer  12/07/2017 10:16:20 AM > level is 15  informed patient.  He will get another Dilantin level in 2 weeks.                      * * *                ---          * * *          Patient: [name] [name], [name] [name] DOB: 1950/08/08 Provider: [name] 11/23/2017    ---    Note generated by eClinicalWorks EMR/PM Software ([email or web address])

## 2017-11-28 ENCOUNTER — Ambulatory Visit: Payer: Medicare Other | Admitting: Family Medicine

## 2017-11-29 ENCOUNTER — Ambulatory Visit

## 2017-11-29 ENCOUNTER — Ambulatory Visit: Payer: Medicare Other | Admitting: Physical Therapy

## 2017-11-29 ENCOUNTER — Encounter: Payer: Self-pay | Admitting: Physical Therapy

## 2017-11-29 DIAGNOSIS — M25672 Stiffness of left ankle, not elsewhere classified: Secondary | ICD-10-CM

## 2017-11-29 DIAGNOSIS — R262 Difficulty in walking, not elsewhere classified: Secondary | ICD-10-CM

## 2017-11-29 DIAGNOSIS — M79662 Pain in left lower leg: Secondary | ICD-10-CM

## 2017-11-29 DIAGNOSIS — M25572 Pain in left ankle and joints of left foot: Secondary | ICD-10-CM

## 2017-11-29 NOTE — Therapy (Signed)
[place], [place], [address] Phone: [phone]   Fax:  [phone]  Physical Therapy Treatment  Patient Details  Name: Brandon [name] MRN: [id number] Date of Birth: May 29, 1950 Referring Provider: Charlann Boxer   Encounter Date: 11/29/2017  PT End of Session - 11/29/17 1101    Visit Number  2    Number of Visits  6    Date for PT Re-Evaluation  01/01/18    Authorization Type  UHC    PT Start Time  1100    PT Stop Time  1150    PT Time Calculation (min)  50 min    Activity Tolerance  Patient tolerated treatment well    Behavior During Therapy  [facility] for tasks assessed/performed       Past Medical History:  Diagnosis Date  . Allergy    mild  . Aortic aneurysm (HCC)    small and watching  . GERD (gastroesophageal reflux disease)   . Head and neck cancer    head and neck ca dx 03/2009  . Hyperlipidemia   . Hypertension   . oropharyngeal ca dx'd 03/2009   chemo/xrt comp 06/2009  . Thyroid disease     Past Surgical History:  Procedure Laterality Date  . HERNIA REPAIR     inguinal  . hip replacemnt left Left   . Left eye    . stmach surgery     repair peg tube site    There were no vitals filed for this visit.  Subjective Assessment - 11/29/17 1105    Subjective  I am not feeling the pain on the bottom of my heel but the back of my heel still hurts .  I liked the taping of my heel    Patient Stated Goals  I want my heel to be healed so I can remain active    Currently in Pain?  Yes    Pain Score  2     Pain Location  Heel    Pain Orientation  Left;Lower    Pain Descriptors / Indicators  Tender;Discomfort    Pain Type  Chronic pain    Pain Onset  More than a month ago    Pain Frequency  Intermittent                       [facility] Adult PT Treatment/Exercise - 11/29/17 1109      Modalities   Modalities  Moist Heat      Moist Heat Therapy   Number Minutes Moist Heat  10  Minutes    Moist Heat Location  Other (comment)   left foot and heel     Manual Therapy   Manual Therapy  Soft tissue mobilization    Manual therapy comments  skilled palpation with TPDN    Soft tissue mobilization  plantar fascia and gastroc soleus left only      Ankle Exercises: Aerobic   Elliptical  5 min level 1       Ankle Exercises: Standing   Other Standing Ankle Exercises  25 x bilheel raise and 25 x left heel raise    Other Standing Ankle Exercises  climbing ladder 6 rungs x 4       Ankle Exercises: Stretches   Plantar Fascia Stretch  2 reps;30 seconds   left   Soleus Stretch  2 reps;30 seconds   left   Gastroc Stretch  2 reps;30 seconds  Other Stretch  gastroc stretch on step with heel raise x 15 bil        Trigger Point Dry Needling - 11/29/17 1123    Consent Given?  Yes    Education Handout Provided  --   previously given   Muscles Treated Lower Body  Gastrocnemius;Soleus;Quadratus plantae   left side   Gastrocnemius Response  Twitch response elicited;Palpable increased muscle length    Soleus Response  Twitch response elicited;Palpable increased muscle length    Quadatus Plantae Response  Twitch response elicited;Palpable increased muscle length           PT Education - 11/29/17 1145    Education Details  educated on heel taping and sent link for selp heel pad taping.  Pt declined taping this session due to going scuba diving, reinforced HEP    Person(s) Educated  Patient    Methods  Explanation;Demonstration    Comprehension  Verbalized understanding;Returned demonstration;Verbal cues required       PT Short Term Goals - 11/20/17 1414      PT SHORT TERM GOAL #1   Title  STG=LTG        PT Long Term Goals - 11/20/17 1414      PT LONG TERM GOAL #1   Title  Pt will be independent with advanced HEP    Baseline  no knowledge    Time  6    Period  Weeks    Status  New    Target Date  01/01/18      PT LONG TERM GOAL #2   Title  Pt will know  how to use heel lifts and progress until no longer needs with heel pain    Baseline  PT given heel lifts at evaluation    Time  6    Period  Weeks    Status  New    Target Date  01/01/18      PT LONG TERM GOAL #3   Title  Pt will increas DF to at least 10 degrees to show improved flexibility of heel cord     Baseline  left 2 and Right 5    Time  6    Period  Weeks    Status  New    Target Date  01/01/18      PT LONG TERM GOAL #4   Title  "FOTO will improve from  27% limtation  to  21% limitaiton   indicating improved functional mobility.     Baseline  limited 27%    Time  6    Period  Weeks    Status  New    Target Date  01/01/18            Plan - 11/29/17 1158    Clinical Impression Statement  Pt returns for 2nd visit.  Pt consented to [facility] for left heel pain and pain reduced from 6/10 to 2/10 from eval. Pt recieved information and education about taping heel for home use which seem effecttive. Will continue toward goal progression     Rehab Potential  Excellent    PT Frequency  1x / week    PT Duration  6 weeks    PT Next Visit Plan  assess TPDN and taping , add strength as needed simulate ladder climbing update HEP    PT Home Exercise Plan  gastroc and soleus and plantar flex     Consulted and Agree with Plan of Care  Patient  Patient will benefit from skilled therapeutic intervention in order to improve the following deficits and impairments:  Difficulty walking, Pain, Decreased activity tolerance, Decreased range of motion, Decreased strength, Decreased mobility  Visit Diagnosis: Difficulty in walking, not elsewhere classified  Pain in left lower leg  Stiffness of left ankle, not elsewhere classified  Pain in left ankle and joints of left foot     Problem List Patient Active Problem List   Diagnosis Date Noted  . Achilles tendinitis, left leg 09/25/2017  . Benign prostatic hyperplasia without lower urinary tract symptoms 04/23/2017  .  Hypomagnesemia 02/20/2017  . Localized edema 12/25/2016  . Screen for colon cancer 09/09/2014  . Hyperlipidemia with target LDL less than 130 09/09/2014  . Hypothyroidism 09/09/2014  . Thoracic aortic aneurysm without rupture ([place]) 03/13/2014  . Neuropathy due to chemotherapeutic drug ([place]) 09/08/2013  . Routine general medical examination at a health care facility 07/30/2013  . Other abnormal glucose 07/30/2013  . Insomnia 07/30/2013  . Essential hypertension, benign 07/30/2013  . CARCINOMA, SQUAMOUS CELL 04/28/2010  . GERD 02/11/2007   Voncille Lo, PT Certified Exercise Expert for the Aging Adult  11/29/17 12:00 PM Phone: [phone] Fax: [place] [facility] [address] [place], [place], [address] Phone: [phone]   Fax:  [phone]  Name: GIULIANO PREECE MRN: [id number] Date of Birth: Jan 25, 1951

## 2017-12-04 ENCOUNTER — Ambulatory Visit: Payer: Medicare Other | Admitting: Physical Therapy

## 2017-12-04 ENCOUNTER — Other Ambulatory Visit: Payer: Self-pay | Admitting: Internal Medicine

## 2017-12-04 DIAGNOSIS — M25672 Stiffness of left ankle, not elsewhere classified: Secondary | ICD-10-CM

## 2017-12-04 DIAGNOSIS — M25572 Pain in left ankle and joints of left foot: Secondary | ICD-10-CM

## 2017-12-04 DIAGNOSIS — M79662 Pain in left lower leg: Secondary | ICD-10-CM

## 2017-12-04 DIAGNOSIS — R262 Difficulty in walking, not elsewhere classified: Secondary | ICD-10-CM | POA: Diagnosis not present

## 2017-12-04 NOTE — Patient Instructions (Signed)
   Use tennis ball between both ankles and heel rasie 2 x 15 for increasing posterior tibialis for arch support strength.  Voncille Lo, PT Certified Exercise Expert for the Aging Adult  12/04/17 9:54 AM Phone: [phone] Fax: [phone]

## 2017-12-04 NOTE — Therapy (Signed)
[place], [place], [address] Phone: [phone]   Fax:  [phone]  Physical Therapy Treatment  Patient Details  Name: Brandon [name] MRN: [id number] Date of Birth: Jun 29, 1950 Referring Provider: Charlann Boxer   Encounter Date: 12/04/2017  PT End of Session - 12/04/17 1319    Visit Number  3    Number of Visits  6    Date for PT Re-Evaluation  01/01/18    Authorization Type  [employer]    PT Start Time  0931    PT Stop Time  1015    PT Time Calculation (min)  44 min    Activity Tolerance  Patient tolerated treatment well    Behavior During Therapy  [facility] for tasks assessed/performed       Past Medical History:  Diagnosis Date  . Allergy    mild  . Aortic aneurysm (HCC)    small and watching  . GERD (gastroesophageal reflux disease)   . Head and neck cancer    head and neck ca dx 03/2009  . Hyperlipidemia   . Hypertension   . oropharyngeal ca dx'd 03/2009   chemo/xrt comp 06/2009  . Thyroid disease     Past Surgical History:  Procedure Laterality Date  . HERNIA REPAIR     inguinal  . hip replacemnt left Left   . Left eye    . stmach surgery     repair peg tube site    There were no vitals filed for this visit.  Subjective Assessment - 12/04/17 0945    Subjective  i am 2/10 , it has moved from medial to back of heel . I do think I am doing better and I am going to install some glass art this afternoon    Pertinent History  GERD, Oorophyaryngeal CA,, left THA 8 years ago    Limitations  Standing;Walking;House hold activities    Diagnostic tests  ultrasound dx    Patient Stated Goals  I want my heel to be healed so I can remain active    Pain Score  2     Pain Location  Heel    Pain Orientation  Left    Pain Descriptors / Indicators  Tender;Discomfort    Pain Type  Chronic pain    Pain Onset  More than a month ago    Pain Frequency  Intermittent         [facility] PT Assessment - 12/04/17  0001      AROM   Left Ankle Dorsiflexion  8    Left Ankle Plantar Flexion  45    Left Ankle Inversion  20    Left Ankle Eversion  12                   [facility] Adult PT Treatment/Exercise - 12/04/17 0001      Ambulation/Gait   Ambulation Distance (Feet)  600 Feet    Gait Pattern  Step-through pattern    Ambulation Surface  Level    Gait Comments  Pt able to walk with out limping for greater distances , Pt improved DF with better push off on left      Therapeutic Activites    Therapeutic Activities  Work Simulation    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 Pt suggested using stiffer sole on shoes       Manual Therapy   Manual Therapy  Soft  tissue mobilization    Manual therapy comments  skilled palpation with TPDN    Soft tissue mobilization  plantar fascia and gastroc soleus left only and with IASTYM    Kinesiotex  --   heel pad taping left foot     Ankle Exercises: Standing   Toe Raise Limitations  great toe flexion and extension in standing for 2 minutes    Other Standing Ankle Exercises  25 bil heel raise and 25 SL heel raise on left    Other Standing Ankle Exercises  climbing ladder 8 rungs x 4 , standing with bil posterior tib tennis ball squeeze x 20       Ankle Exercises: Stretches   Soleus Stretch  2 reps;30 seconds   left   Gastroc Stretch  2 reps;30 seconds    Other Stretch  gastroc stretch on step with heel raise x 15 bil    on slant board x 2      Ankle Exercises: Aerobic   Elliptical  33min level 2       Trigger Point Dry Needling - 12/04/17 1022    Consent Given?  Yes    Education Handout Provided  --   previously given   Muscles Treated Lower Body  Quadratus plantae;Gastrocnemius;Soleus   left   Gastrocnemius Response  Twitch response elicited;Palpable increased muscle length    Soleus Response  Twitch response elicited;Palpable increased muscle length    Quadatus Plantae Response   Twitch response elicited;Palpable increased muscle length           PT Education - 12/04/17 0954    Education Details  added posterior tib and great toe flex and extension to exercise.  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) Educated  Patient    Methods  Explanation;Tactile cues;Demonstration;Verbal cues;Handout    Comprehension  Verbalized understanding;Returned demonstration       PT Short Term Goals - 12/04/17 1320      PT SHORT TERM GOAL #1   Title  STG=LTG        PT Long Term Goals - 12/04/17 1320      PT LONG TERM GOAL #1   Title  Pt will be independent with advanced HEP    Baseline  Pt given initial HEP and progressing    Time  6    Period  Weeks    Status  On-going      PT LONG TERM GOAL #2   Title  Pt will know how to use heel lifts and progress until no longer needs with heel pain    Baseline  PT given heel lifts at evaluation and utilizes, pain 2/10     Time  6    Period  Weeks    Status  On-going      PT LONG TERM GOAL #3   Title  Pt will increas DF to at least 10 degrees to show improved flexibility of heel cord     Baseline  left 8 degress and Right 12    Time  6    Period  Weeks    Status  On-going      PT LONG TERM GOAL #4   Title  "FOTO will improve from  27% limtation  to  21% limitaiton   indicating improved functional mobility.     Baseline  limited 27%    Time  6    Period  Weeks    Status  Unable to assess  Plan - 12/04/17 1321    Clinical Impression Statement  Pt improved DF form 2 degrees to 8 degrees  in left ankle. Pt is 2/10 pain today and pain has decreased pain in heel and is improving function. Pt is able to simulate work and ladder climbing.  Pt is able to walk without limping today. and has 1 more visit before return to MD and DC. Pt consented to TPDN and was closely monitored througthout session    Rehab Potential  Excellent    PT Frequency  1x / week    PT Duration  6 weeks    PT  Treatment/Interventions  Dry needling;Taping;Passive range of motion;Iontophoresis 4mg /ml Dexamethasone;Electrical Stimulation;Cryotherapy;Moist Heat;Ultrasound;Therapeutic exercise;Therapeutic activities;Functional mobility training;Stair training;Gait training;Neuromuscular re-education;Patient/family education;Manual techniques    PT Next Visit Plan  assess TPDN and taping , add strength as needed MD note sent next visit 9-3/19    PT Home Exercise Plan  gastroc and soleus and plantar flex     Consulted and Agree with Plan of Care  Patient       Patient will benefit from skilled therapeutic intervention in order to improve the following deficits and impairments:  Difficulty walking, Pain, Decreased activity tolerance, Decreased range of motion, Decreased strength, Decreased mobility  Visit Diagnosis: Difficulty in walking, not elsewhere classified  Pain in left lower leg  Stiffness of left ankle, not elsewhere classified  Pain in left ankle and joints of left foot     Problem List Patient Active Problem List   Diagnosis Date Noted  . Achilles tendinitis, left leg 09/25/2017  . Benign prostatic hyperplasia without lower urinary tract symptoms 04/23/2017  . Hypomagnesemia 02/20/2017  . Localized edema 12/25/2016  . Screen for colon cancer 09/09/2014  . Hyperlipidemia with target LDL less than 130 09/09/2014  . Hypothyroidism 09/09/2014  . Thoracic aortic aneurysm without rupture ([place]) 03/13/2014  . Neuropathy due to chemotherapeutic drug ([place]) 09/08/2013  . Routine general medical examination at a health care facility 07/30/2013  . Other abnormal glucose 07/30/2013  . Insomnia 07/30/2013  . Essential hypertension, benign 07/30/2013  . CARCINOMA, SQUAMOUS CELL 04/28/2010  . GERD 02/11/2007   Voncille Lo, PT Certified Exercise Expert for the Aging Adult  12/04/17 1:35 PM Phone: [phone] Fax: [place] [address] [place], [place], [address] Phone: [phone]   Fax:  [phone]  Name: Brandon [name] MRN: [id number] Date of Birth: [birth year]

## 2017-12-07 ENCOUNTER — Other Ambulatory Visit: Payer: Self-pay | Admitting: Internal Medicine

## 2017-12-07 DIAGNOSIS — E876 Hypokalemia: Secondary | ICD-10-CM

## 2017-12-07 DIAGNOSIS — I10 Essential (primary) hypertension: Secondary | ICD-10-CM

## 2017-12-11 ENCOUNTER — Other Ambulatory Visit: Payer: Self-pay | Admitting: *Deleted

## 2017-12-11 ENCOUNTER — Encounter: Payer: Self-pay | Admitting: Physical Therapy

## 2017-12-11 ENCOUNTER — Ambulatory Visit: Payer: Medicare Other | Attending: Family Medicine | Admitting: Physical Therapy

## 2017-12-11 DIAGNOSIS — M25572 Pain in left ankle and joints of left foot: Secondary | ICD-10-CM

## 2017-12-11 DIAGNOSIS — R262 Difficulty in walking, not elsewhere classified: Secondary | ICD-10-CM

## 2017-12-11 DIAGNOSIS — M79606 Pain in leg, unspecified: Secondary | ICD-10-CM

## 2017-12-11 DIAGNOSIS — M79662 Pain in left lower leg: Secondary | ICD-10-CM | POA: Diagnosis present

## 2017-12-11 DIAGNOSIS — M25672 Stiffness of left ankle, not elsewhere classified: Secondary | ICD-10-CM

## 2017-12-11 NOTE — Therapy (Addendum)
[place], [place], [address] Phone: [phone]   Fax:  [phone]  Physical Therapy Treatment/RE evaluation/Discharge Note  Patient Details  Name: Brandon [name] MRN: [id number] Date of Birth: Aug 05, 1950 Referring Provider: Charlann Boxer MD   Encounter Date: 12/11/2017  PT End of Session - 12/11/17 0802    Visit Number  4    Number of Visits  12    Date for PT Re-Evaluation  02/05/18    Authorization Type  UHC    PT Start Time  0800    PT Stop Time  0856    PT Time Calculation (min)  56 min    Activity Tolerance  Patient tolerated treatment well    Behavior During Therapy  [facility] for tasks assessed/performed       Past Medical History:  Diagnosis Date  . Allergy    mild  . Aortic aneurysm (HCC)    small and watching  . GERD (gastroesophageal reflux disease)   . Head and neck cancer    head and neck ca dx 03/2009  . Hyperlipidemia   . Hypertension   . oropharyngeal ca dx'd 03/2009   chemo/xrt comp 06/2009  . Thyroid disease     Past Surgical History:  Procedure Laterality Date  . HERNIA REPAIR     inguinal  . hip replacemnt left Left   . Left eye    . stmach surgery     repair peg tube site    There were no vitals filed for this visit.  Subjective Assessment - 12/11/17 0804    Subjective  I did not wear shoes much yesterday at the beach.  I was on a boat yesterday. I am a 2/10. I am not limping as much. I am still using the heel lifts in shoes to make it feel better.  I am going out of town mostly until October 1st but I am getting better.    Pertinent History  GERD, Oorophyaryngeal CA,, left THA 8 years ago    Limitations  Standing;Walking;House hold activities    How long can you sit comfortably?  unlimited    How long can you stand comfortably?  standing most of the day     How long can you walk comfortably?   2 hours      Diagnostic tests  ultrasound dx    Patient Stated Goals  I  want my heel to be healed so I can remain active    Currently in Pain?  Yes    Pain Score  2     Pain Location  Heel    Pain Orientation  Left    Pain Descriptors / Indicators  Tender;Discomfort    Pain Onset  More than a month ago    Pain Frequency  Intermittent         [facility] PT Assessment - 12/11/17 0853      Assessment   Medical Diagnosis  Left LE pain, heel pain left, Calcific Achilles tendonopathy , cuboid pain    Referring Provider  Charlann Boxer MD      AROM   Right Knee Extension  0    Right Knee Flexion  135    Left Knee Extension  0    Left Knee Flexion  140    Right Ankle Dorsiflexion  12    Right Ankle Plantar Flexion  55    Right Ankle Inversion  32    Right Ankle Eversion  25  Left Ankle Dorsiflexion  9    Left Ankle Plantar Flexion  45    Left Ankle Inversion  21    Left Ankle Eversion  14      Strength   Right Hip Flexion  5/5    Right Hip Extension  4-/5    Right Hip External Rotation   4+/5    Right Hip Internal Rotation  4+/5    Right Hip ABduction  4+/5    Left Hip Flexion  4+/5    Left Hip Extension  4-/5    Left Hip External Rotation  4/5    Left Hip Internal Rotation  4-/5    Left Hip ABduction  4-/5    Right Ankle Dorsiflexion  5/5    Right Ankle Plantar Flexion  5/5    Left Ankle Dorsiflexion  4/5    Left Ankle Plantar Flexion  4/5                   [facility] Adult PT Treatment/Exercise - 12/11/17 0825      Knee/Hip Exercises: Standing   Forward Step Up  10 reps;Left;2 sets;Hand Hold: 1    Other Standing Knee Exercises  monster walks 5 x 20 feet green t band      Knee/Hip Exercises: Supine   Bridges  15 reps;2 sets    Bridges Limitations  VC and TC    Single Leg Bridge  15 reps;Left;Strengthening;2 sets      Knee/Hip Exercises: Sidelying   Clams  Green t band 15 x 2 left side       Moist Heat Therapy   Number Minutes Moist Heat  10 Minutes    Moist Heat Location  Ankle;Other (comment)   left only     Manual Therapy    Manual Therapy  Soft tissue mobilization    Manual therapy comments  skilled palpation with TPDN    Soft tissue mobilization  plantar fascia and gastroc soleus left only and with IASTYM    Kinesiotex  --   heel pad taping left foot      Trigger Point Dry Needling - 12/11/17 0834    Consent Given?  Yes    Education Handout Provided  No   previously given   Muscles Treated Lower Body  Quadratus plantae;Gastrocnemius;Soleus   left only heel as well   Gastrocnemius Response  Twitch response elicited;Palpable increased muscle length    Soleus Response  Twitch response elicited;Palpable increased muscle length    Quadatus Plantae Response  Twitch response elicited;Palpable increased muscle length           PT Education - 12/11/17 0900    Education Details  added to HEP for hip strengthening especially for left side weakness post extended hospitalization /atrophy    Person(s) Educated  Patient    Methods  Explanation;Demonstration;Tactile cues;Verbal cues;Handout    Comprehension  Verbalized understanding;Returned demonstration       PT Short Term Goals - 12/04/17 1320      PT SHORT TERM GOAL #1   Title  STG=LTG        PT Long Term Goals - 12/11/17 0831      PT LONG TERM GOAL #1   Title  Pt will be independent with advanced HEP for bil LE's    Baseline  Pt needs work on hip strengthening as well as ankle and knee    Time  8    Period  Weeks    Status  Revised  Target Date  02/05/18      PT LONG TERM GOAL #2   Title  Pt will know how to use heel lifts and progress until no longer needs with heel pain    Baseline  PT given heel lifts at evaluation and utilizes, pain 2/10  but needs to decrease wearing for healing    Time  6    Status  Achieved    Target Date  01/01/18      PT LONG TERM GOAL #3   Title  Pt will increas DF to at least 12 degrees to show improved flexibility of heel cord     Baseline  left 9 degress and Right 12    Time  8    Period  Weeks     Status  Revised    Target Date  02/05/18      PT LONG TERM GOAL #4   Title  "FOTO will improve from  27% limtation  to  21% limitaiton   indicating improved functional mobility.     Baseline  limited 27%  12-04-17    Time  8    Period  Weeks    Status  Unable to assess    Target Date  02/05/18      PT LONG TERM GOAL #5   Title  Pt will be able to simulate work environment without exacerbating heel pain.  climbing ladders etc    Time  8    Period  Weeks    Status  New    Target Date  02/05/18      PT LONG TERM GOAL #6   Title  "Pt will improve L hip extensor/flexor strength to >/= 4+/5 with </= 1/10 pain to promote safety with walking/standing activities    Time  8    Period  Weeks    Status  New    Target Date  02/05/18            Plan - 12/11/17 0813    Clinical Impression Statement  Pt has improved pain in left heel and gastroc from evaluation from 6/10 to 2/10. Pt does have residual weakness on left LE especially in hip extension, abd and internal rotation.  Pt would benefit from extending skilled PT treatment to maximize strength ,ROM of DF and decrease pain in left heel.  Pt utilizes  heel lifts for pain control but should improve with increased DF of left ankle.  Pt is going on vacation but will continue PT weekly after he returns.  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 Brandon [name] is making great progress but would benefit from additional LE strengthening.  Pt would benefit from 1 x a week for additional 8 weeks to maximize strength and return to PLOF    Rehab Potential  Excellent    PT Frequency  1x / week   complete up to 12 visits max   PT Duration  8 weeks    PT Treatment/Interventions  Dry needling;Taping;Passive range of motion;Iontophoresis 58m/ml Dexamethasone;Electrical Stimulation;Cryotherapy;Moist Heat;Ultrasound;Therapeutic  exercise;Therapeutic activities;Functional mobility training;Stair training;Gait training;Neuromuscular re-education;Patient/family education;Manual techniques    PT Next Visit Plan    DO FOTO on 5th visit  continue TPDN and assess heel pain.  [place] Hip strengtheining exercises.  May add knee for atrophy of left leg for prolonged hosptialization previous    PT Home Exercise Plan  gastroc and  soleus and plantar flex     Consulted and Agree with Plan of Care  Patient       Patient will benefit from skilled therapeutic intervention in order to improve the following deficits and impairments:  Difficulty walking, Pain, Decreased activity tolerance, Decreased range of motion, Decreased strength, Decreased mobility  Visit Diagnosis: Difficulty in walking, not elsewhere classified - Plan: PT plan of care cert/re-cert  Pain in left lower leg - Plan: PT plan of care cert/re-cert  Stiffness of left ankle, not elsewhere classified - Plan: PT plan of care cert/re-cert  Pain in left ankle and joints of left foot - Plan: PT plan of care cert/re-cert     Problem List Patient Active Problem List   Diagnosis Date Noted  . Achilles tendinitis, left leg 09/25/2017  . Benign prostatic hyperplasia without lower urinary tract symptoms 04/23/2017  . Hypomagnesemia 02/20/2017  . Localized edema 12/25/2016  . Screen for colon cancer 09/09/2014  . Hyperlipidemia with target LDL less than 130 09/09/2014  . Hypothyroidism 09/09/2014  . Thoracic aortic aneurysm without rupture ([place]) 03/13/2014  . Neuropathy due to chemotherapeutic drug ([place]) 09/08/2013  . Routine general medical examination at a health care facility 07/30/2013  . Other abnormal glucose 07/30/2013  . Insomnia 07/30/2013  . Essential hypertension, benign 07/30/2013  . CARCINOMA, SQUAMOUS CELL 04/28/2010  . GERD 02/11/2007    Voncille Lo, PT Certified Exercise Expert for the Aging Adult  12/11/17 9:18 AM Phone: [phone] Fax:  [place] [facility] [address] [place], [place], [address] Phone: [phone]   Fax:  [phone]  Name: Brandon [name] MRN: [id number] Date of Birth: 08-Dec-1950   PHYSICAL THERAPY DISCHARGE SUMMARY  Visits from Start of Care: 4  Current functional level related to goals / functional outcomes: As above   Remaining deficits: Pt called on 01-09-18 and stated foot is 90% better and will cancel aplpt   Education / Equipment: HEP Plan: Patient agrees to discharge.  Patient goals were partially met. Patient is being discharged due to being pleased with the current functional level.  ?????    Pt is cancelling appt and is pleased with 90% improvement of ankle  Voncille Lo, PT Certified Exercise Expert for the Aging Adult  01/22/18 4:48 PM Phone: [phone] Fax: [phone]

## 2017-12-11 NOTE — Patient Instructions (Addendum)
HIP: Abduction / External Rotation (Band)   Place band around knees. Lie on side with hips and knees bent. Raise top knee up, squeezing glutes. Keep feet together. Hold _3__ seconds. Use ___green t_____ band. 15___ reps per set, _2__ sets per day, __7_ days per week  Bridge   Lie back, legs bent. Inhale, pressing hips up. Keeping ribs in, lengthen lower back. Exhale, rolling down along spine from top. Repeat _15 x 2___ times. Do __1__ sessions per day.   Bridging (Single Leg)   Lie on back with feet shoulder width apart and right leg straight. Lift hips toward the ceiling while keeping leg straight. Hold _3___ seconds. Repeat _15 x 2___ times. Do __1__ sessions per day.     HIP: Abduction - Side Step (Band)   Place band around ankles. Alternating legs: step out, step out, step in, step in. Repeat, stepping out first with other leg. Hold _3__ seconds. Use ___green_____ band. 15___ reps per set, __2_ sets per day, _7__ days per week Hold onto a support.  Copyright  [employer]. All rights reserved.   Voncille Lo, PT Certified Exercise Expert for the Aging Adult  12/11/17 8:15 AM Phone: [phone] Fax: [phone]

## 2017-12-14 NOTE — Progress Notes (Signed)
[name] [place], [name] [address] Phone: [phone] Subjective:     I Brandon [name] am serving as a Education administrator for Dr. Hulan Saas.   CC: Left foot pain  UXL:[id number]  Brandon [name] is a 67 y.o. male coming in with complaint of left foot pain. Still pain in the hip. Ankle is doing better. Pain has moved to the lateral side. Has been doing dry needling.  Left foot pain overall though seems about 90% better.  Patient states swelling after a significant amount of walking does he has some discomfort in the heel.  Nothing as severe as what it was previously.      Past Medical History:  Diagnosis Date  . Allergy    mild  . Aortic aneurysm (HCC)    small and watching  . GERD (gastroesophageal reflux disease)   . Head and neck cancer    head and neck ca dx 03/2009  . Hyperlipidemia   . Hypertension   . oropharyngeal ca dx'd 03/2009   chemo/xrt comp 06/2009  . Thyroid disease    Past Surgical History:  Procedure Laterality Date  . HERNIA REPAIR     inguinal  . hip replacemnt left Left   . Left eye    . stmach surgery     repair peg tube site   Social History   Socioeconomic History  . Marital status: Married    Spouse name: Not on file  . Number of children: 2  . Years of education: 77  . Highest education level: Not on file  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 History  . 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 Financial resource strain: Not on file  . Food insecurity:    Worry: Not on file    Inability: Not on file  . Transportation needs:    Medical: Not on file    Non-medical: Not on file  Tobacco Use  . Smoking status: Former Smoker    Packs/day: 1.50    Years: 35.00    Pack years: 52.50    Types: Cigarettes, Cigars    Start date: 05/16/1973    Last attempt to quit: 04/19/2009    Years since quitting: 8.6  . Smokeless tobacco: Never Used  . Tobacco comment: quit smoking 5 years sago  Substance and  Sexual Activity  . Alcohol use: Yes    Alcohol/week: 15.0 standard drinks    Types: 15 Shots of liquor per week  . Drug use: No  . Sexual activity: Yes    Partners: Female  Lifestyle  . Physical activity:    Days per week: Not on file    Minutes per session: Not on file  . Stress: Not on file  Relationships  . Social connections:    Talks on phone: Not on file    Gets together: Not on file    Attends religious service: Not on file    Active member of club or organization: Not on file    Attends meetings of clubs or organizations: Not on file    Relationship status: Not on file  Other Topics Concern  . Not on file  Social History Narrative   [employer], [employer]-no degree. married - '76 - 5 years, divorced; remarried  '84. 1 son - '85; 1 daughter '89. 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    Allergies  Allergen Reactions  . Benicar [Olmesartan]     fatigue  .  Amlodipine Swelling   Family History  Problem Relation Age of Onset  . Cancer Mother        lymphoma  . Colon cancer Mother   . Cancer Father        lung (smoker)  . Hyperlipidemia Father   . Hypertension Father   . Diabetes Father   . Esophageal cancer Neg Hx   . Rectal cancer Neg Hx   . Stomach cancer Neg Hx     Current Outpatient Medications (Endocrine & Metabolic):  .  SYNTHROID 100 MCG tablet, TAKE 1 TABLET DAILY  Current Outpatient Medications (Cardiovascular):  .  carvedilol (COREG) 6.25 MG tablet, Take 1 tablet (6.25 mg total) by mouth 2 (two) times daily with a meal. .  rosuvastatin (CRESTOR) 20 MG tablet, TAKE 1 TABLET DAILY .  spironolactone (ALDACTONE) 25 MG tablet, TAKE 1 TABLET BY MOUTH EVERY DAY   Current Outpatient Medications (Analgesics):  .  aspirin 81 MG tablet, Take 81 mg by mouth daily. .  DUEXIS 800-26.6 MG TABS, TAKE ONE TABLET BY MOUTH THREE TIMES DAILY FOR 30 DAYS.   Current Outpatient Medications (Other):  Marland Kitchen  Magnesium 250 MG TABS, Take 1 tablet by mouth 2 (two)  times daily. .  Magnesium Oxide 250 MG TABS, TAKE 1 TABLET BY MOUTH TWICE A DAY .  omeprazole (PRILOSEC) 40 MG capsule,  .  zolpidem (AMBIEN) 10 MG tablet, TAKE 1 TABLET AT BEDTIME ASNEEDED .  Vitamin D, Ergocalciferol, (DRISDOL) 50000 units CAPS capsule, TAKE 1 CAPSULE (50,000 UNITS TOTAL) BY MOUTH EVERY 7 (SEVEN) DAYS.    Past medical history, social, surgical and family history all reviewed in electronic medical record.  No pertanent information unless stated regarding to the chief complaint.   Review of Systems:  No headache, visual changes, nausea, vomiting, diarrhea, constipation, dizziness, abdominal pain, skin rash, fevers, chills, night sweats, weight loss, swollen lymph nodes, body aches, joint swelling, chest pain, shortness of breath, mood changes.  Positive muscle aches  Objective  Blood pressure 130/90, pulse 61, height 5\' 10"  (1.778 m), weight 176 lb (79.8 kg), SpO2 94 %.    General: No apparent distress alert and oriented x3 mood and affect normal, dressed appropriately.  HEENT: Pupils equal, extraocular movements intact  Respiratory: Patient's speak in full sentences and does not appear short of breath  Cardiovascular: No lower extremity edema, non tender, no erythema  Skin: Warm dry intact with no signs of infection or rash on extremities or on axial skeleton.  Abdomen: Soft nontender  Neuro: Cranial nerves II through XII are intact, neurovascularly intact in all extremities with 2+ DTRs and 2+ pulses.  Lymph: No lymphadenopathy of posterior or anterior cervical chain or axillae bilaterally.  Gait normal with good balance and coordination.  MSK:  Non tender with full range of motion and good stability and symmetric strength and tone of shoulders, elbows, wrist, hip, knee bilaterally.  Left heel exam shows the patient does have some tightness of the posterior cord of the Achilles.  Patient does have some limited range of motion lacking last 5 degrees of  dorsiflexion.  Musculoskeletal ultrasound was performed and interpreted by Lyndal Pulley  Limited musculoskeletal ultrasound did show that patient still has some very mild calcific changes but significant improvement.  No intrasubstance tearing of appreciated.  No increasing Doppler flow. : Interval healing of the Achilles tendinitis that was seen previously nearly completely resolved    Impression and Recommendations:     This case required medical decision  making of moderate complexity. The above documentation has been reviewed and is accurate and complete Lyndal Pulley, DO       Note: This dictation was prepared with Dragon dictation along with smaller phrase technology. Any transcriptional errors that result from this process are unintentional.

## 2017-12-15 ENCOUNTER — Other Ambulatory Visit: Payer: Self-pay | Admitting: Family Medicine

## 2017-12-17 ENCOUNTER — Ambulatory Visit (INDEPENDENT_AMBULATORY_CARE_PROVIDER_SITE_OTHER): Payer: Medicare Other | Admitting: Family Medicine

## 2017-12-17 ENCOUNTER — Encounter: Payer: Self-pay | Admitting: Family Medicine

## 2017-12-17 ENCOUNTER — Ambulatory Visit: Payer: Self-pay

## 2017-12-17 VITALS — BP 130/90 | HR 61 | Ht 70.0 in | Wt 176.0 lb

## 2017-12-17 DIAGNOSIS — M79672 Pain in left foot: Secondary | ICD-10-CM | POA: Diagnosis not present

## 2017-12-17 DIAGNOSIS — M7662 Achilles tendinitis, left leg: Secondary | ICD-10-CM

## 2017-12-17 NOTE — Patient Instructions (Signed)
Good to see you  Ice is your friend Keep it up  See me again in 6-8 weeks  

## 2017-12-17 NOTE — Telephone Encounter (Signed)
Refill done.  

## 2017-12-17 NOTE — Assessment & Plan Note (Signed)
Patient seems to be doing well.  Discussed icing regimen and home exercises.  Discussed topical anti-inflammatories.  Discussed proper shoes.  Follow-up again in 8 weeks to make sure completely entirely resolved

## 2017-12-18 ENCOUNTER — Other Ambulatory Visit: Payer: Self-pay | Admitting: Internal Medicine

## 2017-12-18 DIAGNOSIS — K219 Gastro-esophageal reflux disease without esophagitis: Secondary | ICD-10-CM

## 2017-12-18 MED ORDER — OMEPRAZOLE 40 MG PO CPDR: 40.0000 mg | DELAYED_RELEASE_CAPSULE | Freq: Every day | ORAL | 1 refills | Status: DC

## 2017-12-21 ENCOUNTER — Ambulatory Visit: Admitting: Neurology

## 2017-12-21 NOTE — Progress Notes (Signed)
* * *        **  [name], [name]**    --- ---    80 Y old Male, DOB: 1950/07/10    [address], [place], [place] [address]    Home: [phone]    Provider: [name]        * * *    Telephone Encounter    ---    Answered by   Loel Lofty  Date: 12/21/2017         Time: 08:18 AM    Caller   patient    --- ---            Reason   call back            Message                      Good Morning,      Patient received a call from [name] [name] and he is returning her call. Patient can be reached at [phone], thank you.                 Action Taken                      [facility]  12/21/2017 8:19:53 AM >       [name] [name]  12/21/2017 4:54:18 PM > I did not call the patient.  He was due to get another dilantin level today. Do no have results in chart at this time.  Will follow up on Monday.        [facility]  12/24/2017 9:25:45 AM > called patient at the number provided.  He was calling to see what his dilantin level was that was drawn at [facility] on 9/9  no results in chart.  Called [facility]  Dilantin level 9.1  patient reports he is feeling great, currently taking 330mg  a day.  Will discuss with Dr. [name] and get back to patient.  Per patient ok to leave a VM for him.        Meylin Stenzel  12/25/2017 8:04:09 AM > Issue is we have gone with so many changes back and forth over time- can go to 360 and recheck level next week [name]  12/26/2017 9:49:07 AM > informed patient of instructions.  He will increase dose and will get another level next week.                      * * *                ---          * * *          Patient: [name], [name] DOB: [birth year] Provider: [name] 12/21/2017    ---    Note generated by eClinicalWorks EMR/PM Software ([email or web address])

## 2017-12-24 ENCOUNTER — Ambulatory Visit

## 2017-12-27 NOTE — Telephone Encounter (Signed)
Error

## 2018-01-03 ENCOUNTER — Ambulatory Visit: Admitting: Neurology

## 2018-01-03 MED ORDER — Dilantin: 30 | 270 | Freq: Every day | 2 refills | 0 days | Status: AC

## 2018-01-04 ENCOUNTER — Other Ambulatory Visit: Payer: Self-pay | Admitting: Internal Medicine

## 2018-01-04 DIAGNOSIS — I712 Thoracic aortic aneurysm, without rupture, unspecified: Secondary | ICD-10-CM

## 2018-01-04 DIAGNOSIS — E039 Hypothyroidism, unspecified: Secondary | ICD-10-CM

## 2018-01-04 DIAGNOSIS — I10 Essential (primary) hypertension: Secondary | ICD-10-CM

## 2018-01-08 ENCOUNTER — Ambulatory Visit: Payer: Medicare Other | Admitting: Physical Therapy

## 2018-01-09 ENCOUNTER — Encounter: Payer: Self-pay | Admitting: Physical Therapy

## 2018-01-15 ENCOUNTER — Ambulatory Visit: Payer: Medicare Other | Admitting: Physical Therapy

## 2018-01-17 ENCOUNTER — Telehealth: Payer: Self-pay | Admitting: Internal Medicine

## 2018-01-17 NOTE — Telephone Encounter (Signed)
Copied from [place] [id number]. Topic: Quick Communication - See Telephone Encounter >> Jan 17, 2018  2:59 PM Blase Mess A wrote: CRM for notification. See Telephone encounter for: 01/17/18.  Patients wife says that she was returning a call back from Tammy Please call Patient's wife Advised that Lynelle Smoke will not be back until Monday. [phone]

## 2018-01-18 NOTE — Telephone Encounter (Signed)
error 

## 2018-01-21 NOTE — Telephone Encounter (Signed)
Left vm to call back

## 2018-01-22 ENCOUNTER — Encounter: Payer: Medicare Other | Admitting: Physical Therapy

## 2018-01-25 ENCOUNTER — Ambulatory Visit

## 2018-01-29 ENCOUNTER — Encounter: Payer: Medicare Other | Admitting: Physical Therapy

## 2018-02-05 ENCOUNTER — Encounter: Payer: Medicare Other | Admitting: Physical Therapy

## 2018-03-06 ENCOUNTER — Other Ambulatory Visit: Payer: Self-pay | Admitting: Internal Medicine

## 2018-03-06 DIAGNOSIS — I712 Thoracic aortic aneurysm, without rupture, unspecified: Secondary | ICD-10-CM

## 2018-03-11 ENCOUNTER — Other Ambulatory Visit: Payer: Self-pay | Admitting: Internal Medicine

## 2018-03-11 DIAGNOSIS — E876 Hypokalemia: Secondary | ICD-10-CM

## 2018-03-11 DIAGNOSIS — I10 Essential (primary) hypertension: Secondary | ICD-10-CM

## 2018-03-13 ENCOUNTER — Ambulatory Visit: Admitting: Neurology

## 2018-03-13 NOTE — Progress Notes (Signed)
* * *        **  [name] [name], [name] [name]**    --- ---    60 Y old Male, DOB: 08/31/1950    [address], [place], [place] [address]    Home: [phone]    Provider: [name]        * * *    Telephone Encounter    ---    Answered by   Diannia Ruder  Date: 03/13/2018         Time: 02:37 PM    Reason   appointment    --- ---            Message                      Patient called and stated he was scheduled for 12/09 and was told the doctor would not be here in clinic so he is very confused and would like clarification on this matter.                Action Taken                      Villegas,Alexa  03/13/2018 2:37:14 PM >      Leighton Roach  03/14/2018 9:00:59 AM > pt is calling again with the same question.Please call him today      [name],[name]  03/14/2018 12:54:07 PM > called and spoke with patient was getting reminders for appt that had already been cx. all set now. thanks Dez                    * * *                ---          * * *          Patient: [name] [name], [name] [name] DOB: Sep 18, 1950 Provider: [name] 03/13/2018    ---    Note generated by eClinicalWorks EMR/PM Software ([email or web address])

## 2018-03-14 ENCOUNTER — Other Ambulatory Visit: Payer: Self-pay | Admitting: Internal Medicine

## 2018-03-14 ENCOUNTER — Encounter: Payer: Self-pay | Admitting: Internal Medicine

## 2018-03-14 DIAGNOSIS — E039 Hypothyroidism, unspecified: Secondary | ICD-10-CM

## 2018-03-18 NOTE — Telephone Encounter (Signed)
error 

## 2018-03-20 ENCOUNTER — Encounter: Payer: Self-pay | Admitting: Internal Medicine

## 2018-03-20 DIAGNOSIS — I10 Essential (primary) hypertension: Secondary | ICD-10-CM

## 2018-03-20 DIAGNOSIS — E876 Hypokalemia: Secondary | ICD-10-CM

## 2018-03-20 MED ORDER — SPIRONOLACTONE 25 MG PO TABS: 25.0000 mg | ORAL_TABLET | Freq: Every day | ORAL | 0 refills | Status: DC

## 2018-03-20 NOTE — Addendum Note (Signed)
Addended by: Karle Barr on: 03/20/2018 04:18 PM   Modules accepted: Orders

## 2018-03-20 NOTE — Telephone Encounter (Signed)
I scheduled pt for his physical in January

## 2018-03-22 ENCOUNTER — Other Ambulatory Visit (INDEPENDENT_AMBULATORY_CARE_PROVIDER_SITE_OTHER): Payer: Medicare Other

## 2018-03-22 ENCOUNTER — Encounter: Payer: Self-pay | Admitting: Internal Medicine

## 2018-03-22 DIAGNOSIS — I712 Thoracic aortic aneurysm, without rupture, unspecified: Secondary | ICD-10-CM

## 2018-03-22 DIAGNOSIS — E039 Hypothyroidism, unspecified: Secondary | ICD-10-CM

## 2018-03-22 LAB — TSH: TSH: 2.45 u[IU]/mL (ref 0.35–4.50)

## 2018-03-22 LAB — BASIC METABOLIC PANEL
BUN: 32 mg/dL — ABNORMAL HIGH (ref 6–23)
CALCIUM: 9.2 mg/dL (ref 8.4–10.5)
CO2: 26 meq/L (ref 19–32)
Chloride: 104 mEq/L (ref 96–112)
Creatinine, Ser: 0.92 mg/dL (ref 0.40–1.50)
GFR: 87.5 mL/min (ref >60.00)
GLUCOSE: 77 mg/dL (ref 70–99)
Potassium: 4 mEq/L (ref 3.5–5.1)
SODIUM: 141 meq/L (ref 135–145)

## 2018-03-22 NOTE — Telephone Encounter (Signed)
AMBIEN was never sent in, please advise

## 2018-03-22 NOTE — Telephone Encounter (Signed)
appt made

## 2018-03-23 ENCOUNTER — Encounter: Payer: Self-pay | Admitting: Internal Medicine

## 2018-03-23 ENCOUNTER — Other Ambulatory Visit: Payer: Self-pay | Admitting: Internal Medicine

## 2018-03-23 DIAGNOSIS — E876 Hypokalemia: Secondary | ICD-10-CM

## 2018-03-23 DIAGNOSIS — I10 Essential (primary) hypertension: Secondary | ICD-10-CM

## 2018-03-23 DIAGNOSIS — F5101 Primary insomnia: Secondary | ICD-10-CM

## 2018-03-23 MED ORDER — ZOLPIDEM TARTRATE 10 MG PO TABS: ORAL_TABLET | ORAL | 0 refills | Status: DC

## 2018-03-23 MED ORDER — SPIRONOLACTONE 25 MG PO TABS: 25.0000 mg | ORAL_TABLET | Freq: Every day | ORAL | 0 refills | Status: DC

## 2018-04-01 ENCOUNTER — Ambulatory Visit (INDEPENDENT_AMBULATORY_CARE_PROVIDER_SITE_OTHER)
Admission: RE | Admit: 2018-04-01 | Discharge: 2018-04-01 | Disposition: A | Discharge status: Home / Self Care | Payer: Medicare Other | Source: Ambulatory Visit | Attending: Internal Medicine | Admitting: Internal Medicine

## 2018-04-01 DIAGNOSIS — I712 Thoracic aortic aneurysm, without rupture, unspecified: Secondary | ICD-10-CM

## 2018-04-01 MED ORDER — IOPAMIDOL (ISOVUE-370) INJECTION 76%
80.0000 mL | Freq: Once | INTRAVENOUS | Status: AC | PRN
  Administered 2018-04-01: 80 mL via INTRAVENOUS

## 2018-04-04 ENCOUNTER — Other Ambulatory Visit: Payer: Self-pay | Admitting: Internal Medicine

## 2018-04-04 ENCOUNTER — Encounter: Payer: Self-pay | Admitting: Internal Medicine

## 2018-04-04 DIAGNOSIS — I712 Thoracic aortic aneurysm, without rupture, unspecified: Secondary | ICD-10-CM

## 2018-04-04 DIAGNOSIS — I10 Essential (primary) hypertension: Secondary | ICD-10-CM

## 2018-04-04 DIAGNOSIS — E039 Hypothyroidism, unspecified: Secondary | ICD-10-CM

## 2018-04-24 ENCOUNTER — Ambulatory Visit (INDEPENDENT_AMBULATORY_CARE_PROVIDER_SITE_OTHER): Payer: Medicare Other | Admitting: Internal Medicine

## 2018-04-24 ENCOUNTER — Encounter: Payer: Self-pay | Admitting: Internal Medicine

## 2018-04-24 ENCOUNTER — Other Ambulatory Visit (INDEPENDENT_AMBULATORY_CARE_PROVIDER_SITE_OTHER): Payer: Medicare Other

## 2018-04-24 VITALS — BP 162/102 | HR 75 | Temp 97.9°F | Resp 16 | Ht 70.0 in | Wt 180.5 lb

## 2018-04-24 DIAGNOSIS — F1010 Alcohol abuse, uncomplicated: Secondary | ICD-10-CM | POA: Diagnosis not present

## 2018-04-24 DIAGNOSIS — K219 Gastro-esophageal reflux disease without esophagitis: Secondary | ICD-10-CM

## 2018-04-24 DIAGNOSIS — Z77011 Contact with and (suspected) exposure to lead: Secondary | ICD-10-CM

## 2018-04-24 DIAGNOSIS — N400 Enlarged prostate without lower urinary tract symptoms: Secondary | ICD-10-CM | POA: Diagnosis not present

## 2018-04-24 DIAGNOSIS — R238 Other skin changes: Secondary | ICD-10-CM

## 2018-04-24 DIAGNOSIS — E785 Hyperlipidemia, unspecified: Secondary | ICD-10-CM

## 2018-04-24 DIAGNOSIS — R590 Localized enlarged lymph nodes: Secondary | ICD-10-CM

## 2018-04-24 DIAGNOSIS — R233 Spontaneous ecchymoses: Secondary | ICD-10-CM

## 2018-04-24 DIAGNOSIS — N281 Cyst of kidney, acquired: Secondary | ICD-10-CM

## 2018-04-24 DIAGNOSIS — Z0001 Encounter for general adult medical examination with abnormal findings: Secondary | ICD-10-CM

## 2018-04-24 DIAGNOSIS — I10 Essential (primary) hypertension: Secondary | ICD-10-CM

## 2018-04-24 DIAGNOSIS — L989 Disorder of the skin and subcutaneous tissue, unspecified: Secondary | ICD-10-CM

## 2018-04-24 DIAGNOSIS — R7871 Abnormal lead level in blood: Secondary | ICD-10-CM | POA: Insufficient documentation

## 2018-04-24 DIAGNOSIS — Z0000 Encounter for general adult medical examination without abnormal findings: Secondary | ICD-10-CM

## 2018-04-24 LAB — PROTIME-INR
INR: 1.1 ratio — ABNORMAL HIGH (ref 0.8–1.0)
PROTHROMBIN TIME: 12.3 s (ref 9.6–13.1)

## 2018-04-24 LAB — URINALYSIS, ROUTINE W REFLEX MICROSCOPIC
Hgb urine dipstick: NEGATIVE
Leukocytes, UA: NEGATIVE
Nitrite: NEGATIVE
RBC / HPF: NONE SEEN (ref 0–?)
Total Protein, Urine: NEGATIVE
Urine Glucose: NEGATIVE
Urobilinogen, UA: 0.2 (ref 0.0–1.0)
pH: 5.5 (ref 5.0–8.0)

## 2018-04-24 LAB — COMPREHENSIVE METABOLIC PANEL
ALT: 27 U/L (ref 0–53)
AST: 33 U/L (ref 0–37)
Albumin: 4.3 g/dL (ref 3.5–5.2)
Alkaline Phosphatase: 72 U/L (ref 39–117)
BUN: 27 mg/dL — ABNORMAL HIGH (ref 6–23)
CO2: 28 meq/L (ref 19–32)
Calcium: 9.7 mg/dL (ref 8.4–10.5)
Chloride: 102 mEq/L (ref 96–112)
Creatinine, Ser: 1.2 mg/dL (ref 0.40–1.50)
GFR: 64.37 mL/min (ref >60.00)
Glucose, Bld: 100 mg/dL — ABNORMAL HIGH (ref 70–99)
Potassium: 4.4 mEq/L (ref 3.5–5.1)
Sodium: 140 mEq/L (ref 135–145)
Total Bilirubin: 0.6 mg/dL (ref 0.2–1.2)
Total Protein: 7.1 g/dL (ref 6.0–8.3)

## 2018-04-24 LAB — CBC WITH DIFFERENTIAL/PLATELET
BASOS PCT: 1.1 % (ref 0.0–3.0)
Basophils Absolute: 0.1 10*3/uL (ref 0.0–0.1)
EOS ABS: 0.1 10*3/uL (ref 0.0–0.7)
Eosinophils Relative: 1.7 % (ref 0.0–5.0)
HCT: 47.4 % (ref 39.0–52.0)
Hemoglobin: 16.3 g/dL (ref 13.0–17.0)
Lymphocytes Relative: 17.3 % (ref 12.0–46.0)
Lymphs Abs: 1.1 10*3/uL (ref 0.7–4.0)
MCHC: 34.3 g/dL (ref 30.0–36.0)
MCV: 94 fl (ref 78.0–100.0)
MONO ABS: 0.6 10*3/uL (ref 0.1–1.0)
Monocytes Relative: 9.4 % (ref 3.0–12.0)
Neutro Abs: 4.3 10*3/uL (ref 1.4–7.7)
Neutrophils Relative %: 70.5 % (ref 43.0–77.0)
Platelets: 310 10*3/uL (ref 150.0–400.0)
RBC: 5.04 Mil/uL (ref 4.22–5.81)
RDW: 14.8 % (ref 11.5–15.5)
WBC: 6.1 10*3/uL (ref 4.0–10.5)

## 2018-04-24 LAB — PSA: PSA: 0.8 ng/mL (ref 0.10–4.00)

## 2018-04-24 LAB — SEDIMENTATION RATE: Sed Rate: 15 mm/hr (ref 0–20)

## 2018-04-24 LAB — MAGNESIUM: Magnesium: 1.5 mg/dL (ref 1.5–2.5)

## 2018-04-24 LAB — APTT: aPTT: 28.8 s (ref 23.4–32.7)

## 2018-04-24 LAB — VITAMIN D 25 HYDROXY (VIT D DEFICIENCY, FRACTURES): VITD: 49.72 ng/mL (ref 30.00–100.00)

## 2018-04-24 MED ORDER — CANDESARTAN CILEXETIL 16 MG PO TABS: 16.0000 mg | ORAL_TABLET | Freq: Every day | ORAL | 0 refills | Status: DC

## 2018-04-24 NOTE — Patient Instructions (Signed)

## 2018-04-24 NOTE — Progress Notes (Signed)
Subjective:  Patient ID: Brandon [name], male    DOB: July 08, 1950  Age: 68 y.o. MRN: [id number]  CC: Annual Exam and Hypertension   HPI Brandon [name] presents for a CPX.  He is concerned that his blood pressure is not adequately well controlled.  I prescribed spironolactone for him about a month ago but he is not sure whether or not he is taking it.  He does complain of blurred vision today but he denies headache/chest pain/shortness of breath/palpitations/edema/fatigue.  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  He wants to have his lead level checked.  He recently had a CT angio of the chest done to monitor an aortic aneurysm and there was a comment about hilar lymphadenopathy.  Based on the CT it appears that it has been stable over the last few years.  He complains of the last few months that when he bumps himself he develops a bruise.  He does not complain of any other sources of bruising or bleeding.  Past Medical History:  Diagnosis Date  . Allergy    mild  . Aortic aneurysm (HCC)    small and watching  . GERD (gastroesophageal reflux disease)   . Head and neck cancer    head and neck ca dx 03/2009  . Hyperlipidemia   . Hypertension   . oropharyngeal ca dx'd 03/2009   chemo/xrt comp 06/2009  . Thyroid disease    Past Surgical History:  Procedure Laterality Date  . HERNIA REPAIR     inguinal  . hip replacemnt left Left   . Left eye    . stmach surgery     repair peg tube site    reports that he quit smoking about 9 years ago. His smoking use included cigarettes and cigars. He started smoking about 44 years ago. He has a 52.50 pack-year smoking history. He has never used smokeless tobacco. He reports current alcohol use of about 30.0 standard drinks of alcohol per week. He reports that he does not use drugs. family history includes Cancer in his father and mother; Colon cancer in his mother; Diabetes in his father;  Hyperlipidemia in his father; Hypertension in his father. Allergies  Allergen Reactions  . Benicar [Olmesartan]     fatigue  . Amlodipine Swelling   Outpatient Medications Prior to Visit  Medication Sig Dispense Refill  . aspirin 81 MG tablet Take 81 mg by mouth daily.    . carvedilol (COREG) 6.25 MG tablet TAKE 1 TABLET TWICE A DAY  WITH MEALS 180 tablet 0  . Magnesium Oxide 250 MG TABS TAKE 1 TABLET BY MOUTH TWICE A DAY 180 tablet 1  . omeprazole (PRILOSEC) 40 MG capsule Take 1 capsule (40 mg total) by mouth daily. 90 capsule 1  . rosuvastatin (CRESTOR) 20 MG tablet TAKE 1 TABLET DAILY 90 tablet 1  . spironolactone (ALDACTONE) 25 MG tablet Take 1 tablet (25 mg total) by mouth daily. 90 tablet 0  . SYNTHROID 100 MCG tablet TAKE 1 TABLET DAILY 90 tablet 0  . Vitamin D, Ergocalciferol, (DRISDOL) 50000 units CAPS capsule TAKE 1 CAPSULE (50,000 UNITS TOTAL) BY MOUTH EVERY 7 (SEVEN) DAYS. 12 capsule 0  . zolpidem (AMBIEN) 10 MG tablet TAKE 1 TABLET AT BEDTIME ASNEEDED 90 tablet 0  . DUEXIS 800-26.6 MG TABS TAKE ONE TABLET BY MOUTH THREE TIMES DAILY FOR 30 DAYS. 90 tablet 2  . Magnesium 250 MG TABS Take 1 tablet by  mouth 2 (two) times daily.  1   No facility-administered medications prior to visit.     ROS Review of Systems  Constitutional: Negative.  Negative for chills, diaphoresis, fatigue, fever and unexpected weight change.  HENT: Negative.  Negative for trouble swallowing.   Eyes: Positive for visual disturbance.  Respiratory: Negative for cough, chest tightness, shortness of breath and wheezing.   Cardiovascular: Negative for chest pain, palpitations and leg swelling.  Gastrointestinal: Negative for abdominal pain, constipation, diarrhea, nausea and vomiting.  Endocrine: Negative.   Genitourinary: Negative.  Negative for decreased urine volume, difficulty urinating, dysuria, frequency, penile swelling, scrotal swelling, testicular pain and urgency.  Musculoskeletal: Negative.   Negative for arthralgias, myalgias and neck pain.  Skin: Negative.  Negative for color change and pallor.       He complains of a scab on the top of his head that will not heal.  Allergic/Immunologic: Negative.   Neurological: Negative.  Negative for dizziness, weakness, light-headedness, numbness and headaches.  Hematological: Negative for adenopathy. Bruises/bleeds easily.  Psychiatric/Behavioral: Negative.  Negative for decreased concentration, dysphoric mood and sleep disturbance. The patient is not nervous/anxious.     Objective:  BP (!) 162/102 (BP Location: Left Arm, Patient Position: Sitting, Cuff Size: Normal)   Pulse 75   Temp 97.9 F (36.6 C) (Oral)   Resp 16   Ht 5\' 10"  (1.778 m)   Wt 180 lb 8 oz (81.9 kg)   SpO2 97%   BMI 25.90 kg/m   BP Readings from Last 3 Encounters:  04/24/18 (!) 162/102  12/17/17 130/90  10/31/17 120/90    Wt Readings from Last 3 Encounters:  04/24/18 180 lb 8 oz (81.9 kg)  12/17/17 176 lb (79.8 kg)  10/31/17 179 lb (81.2 kg)    Physical Exam Vitals signs reviewed.  Constitutional:      General: He is not in acute distress.    Appearance: He is not ill-appearing, toxic-appearing or diaphoretic.  HENT:     Head:      Nose: Nose normal. No congestion or rhinorrhea.  Eyes:     General: Lids are normal. Vision grossly intact. No scleral icterus.       Right eye: No discharge.        Left eye: No discharge.     Extraocular Movements:     Right eye: Normal extraocular motion and no nystagmus.     Left eye: Normal extraocular motion and no nystagmus.     Conjunctiva/sclera: Conjunctivae normal.     Right eye: Right conjunctiva is not injected. No chemosis.    Left eye: Left conjunctiva is not injected. No chemosis.    Pupils: Pupils are equal, round, and reactive to light.  Neck:     Musculoskeletal: Normal range of motion and neck supple. No muscular tenderness.  Cardiovascular:     Rate and Rhythm: Normal rate and regular rhythm.       Heart sounds: No murmur. No gallop.      Comments: EKG -  Sinus  Bradycardia  WITHIN NORMAL LIMITS  Pulmonary:     Effort: Pulmonary effort is normal.     Breath sounds: No stridor. No wheezing, rhonchi or rales.  Abdominal:     General: Abdomen is flat.     Palpations: There is no hepatomegaly, splenomegaly or mass.     Tenderness: There is no abdominal tenderness.     Hernia: No hernia is present. There is no hernia in the right inguinal area or left  inguinal area.  Genitourinary:    Pubic Area: No rash.      Penis: Circumcised. No erythema, discharge, swelling or lesions.      Scrotum/Testes: Normal.        Right: Mass, tenderness or swelling not present.        Left: Mass, tenderness or swelling not present.     Epididymis:     Right: Normal. No mass.     Left: No mass.  Musculoskeletal: Normal range of motion.  Lymphadenopathy:     Cervical: No cervical adenopathy.     Lower Body: No right inguinal adenopathy. No left inguinal adenopathy.  Skin:    General: Skin is warm and dry.     Findings: No erythema or rash.  Neurological:     General: No focal deficit present.     Mental Status: He is alert and oriented to person, place, and time. Mental status is at baseline.  Psychiatric:        Mood and Affect: Mood normal.        Behavior: Behavior normal.        Thought Content: Thought content normal.        Judgment: Judgment normal.     Lab Results  Component Value Date   WBC 6.1 04/24/2018   HGB 16.3 04/24/2018   HCT 47.4 04/24/2018   PLT 310.0 04/24/2018   GLUCOSE 100 (H) 04/24/2018   CHOL 170 02/20/2017   TRIG 62.0 02/20/2017   HDL 82.70 02/20/2017   LDLDIRECT 147.7 12/03/2007   LDLCALC 75 02/20/2017   ALT 27 04/24/2018   AST 33 04/24/2018   NA 140 04/24/2018   K 4.4 04/24/2018   CL 102 04/24/2018   CREATININE 1.20 04/24/2018   BUN 27 (H) 04/24/2018   CO2 28 04/24/2018   TSH 2.45 03/22/2018   PSA 0.80 04/24/2018   INR 1.1 (H) 04/24/2018    HGBA1C 5.5 02/20/2017    Ct Angio Chest Aorta W/cm &/or Wo/cm  Result Date: 04/01/2018 CLINICAL DATA:  68 year old male with a history of thoracic aortic aneurysm EXAM: CT ANGIOGRAPHY CHEST WITH CONTRAST TECHNIQUE: Multidetector CT imaging of the chest was performed using the standard protocol during bolus administration of intravenous contrast. Multiplanar CT image reconstructions and MIPs were obtained to evaluate the vascular anatomy. CONTRAST:  77mL ISOVUE-370 IOPAMIDOL (ISOVUE-370) INJECTION 76% COMPARISON:  Prior CTA chest 03/09/2017 and 11/13/2011 FINDINGS: Cardiovascular: The aortic root remains normal in caliber. No effacement of the sino-tubular junction. Mild aneurysmal dilatation of the tubular portion of the ascending thoracic aorta again noted with a maximal transverse diameter of 4.1 cm. No significant interval progression. No evidence of dissection. Conventional 3 vessel arch anatomy. Mild elongation of the infundibulum and descending thoracic aorta. Scattered mild atherosclerotic vascular calcifications. Mediastinum/Nodes: Similar appearance of conspicuous mediastinal lymph nodes which are increased in number but only borderline increased in size. A prevascular lymph node is stable at 1.0 cm (image 32, series 4). An AP window node is stable at 1.1 cm (image 42, series 4). Comparing to more remote prior imaging from August of 2013, there has been slight interval progression of the mediastinal lymphadenopathy. The visualized esophagus is unremarkable. Hypoplastic thyroid gland. Lungs/Pleura: Biapical pleuroparenchymal scarring. Mild centrilobular pulmonary emphysema. No suspicious pulmonary nodule or mass. No pleural effusion or pneumothorax. Upper Abdomen: Giant left upper pole renal cyst measuring up to 10 cm in diameter. Otherwise, no significant abnormality visualized in the upper abdomen. Musculoskeletal: No acute fracture or aggressive appearing lytic or  blastic osseous lesion. Review of  the MIP images confirms the above findings. IMPRESSION: 1. Stable mild fusiform aneurysmal dilatation of the tubular portion of the ascending thoracic aorta with a maximal diameter of 4.1 cm. Typically, multi societal recommendations would suggest continued annual surveillance with CTA or MRA imaging. However, this gentleman's aorta remains stable in size for the past 6 years dating back to 2013. Perhaps surveillance every 2-3 years could be considered. Aortic aneurysm NOS (ICD10-I71.9); Aortic Atherosclerosis (ICD10-170.0) 2. Similar appearance of multifocal mediastinal and hilar lymphadenopathy which remains unchanged compared to recent prior imaging but demonstrates slow progression compared to more remote prior imaging from 2013. While lymphadenopathy may simply be chronic and reactive in nature, and indolent lymphoproliferative process such as CLL is also a consideration. 3. Giant left upper pole renal cyst again noted. 4. Mild centrilobular emphysema.  Emphysema (ICD10-J43.9). Signed, Criselda Peaches, MD, [facility] Electronically Signed   By: Jacqulynn Cadet M.D.   On: 04/01/2018 12:16    Assessment & Plan:   Rickardo was seen today for annual exam and hypertension.  Diagnoses and all orders for this visit:  Essential hypertension, benign- His blood pressure is not adequately well controlled and he is symptomatic with blurred vision.  I have asked him to be certain that he is taking the spironolactone.  Will also add an ARB.  His EKG is negative for LVH or ischemia. -     CBC with Differential/Platelet; Future -     Urinalysis, Routine w reflex microscopic; Future -     VITAMIN D 25 Hydroxy (Vit-D Deficiency, Fractures); Future -     EKG 12-Lead -     candesartan (ATACAND) 16 MG tablet; Take 1 tablet (16 mg total) by mouth daily.  Gastroesophageal reflux disease without esophagitis- His symptoms are well controlled. -      CBC with Differential/Platelet; Future  Hyperlipidemia with target LDL less than 130- I will monitor his lipid panel. -     Comprehensive metabolic panel; Future  Hypomagnesemia- His magnesium level is in the normal range.  Will continue the current magnesium supplement. -     Comprehensive metabolic panel; Future -     Magnesium; Future  LAD (lymphadenopathy), hilar- I reassured he and his wife that I think this is a stable finding.  I will check lab work to screen for secondary causes. -     Sedimentation rate; Future -     HIV Antibody (routine testing w rflx); Future -     Protein electrophoresis, serum; Future  Benign prostatic hyperplasia without lower urinary tract symptoms- His PSA is low which is reassuring that he does not have prostate cancer.  He has no symptoms that need to be treated. -     PSA; Future  Renal cyst, left- No complications noted related to this. -     Urinalysis, Routine w reflex microscopic; Future  Routine general medical examination at a health care facility  Alcohol abuse, continuous-have asked him to limit his alcohol intake to less than 2 or 3 small servings of bourbon per day. -     Vitamin B1; Future  Easy bruising- I do not see any bruising or bleeding today.  Will check his labs to screen for bleeding causes.  I am concerned this may be related to his alcohol intake. -     Protime-INR; Future -     APTT; Future  Skin lesion of scalp -  Ambulatory referral to Dermatology  Lead exposure -     Lead, blood; Future   I have discontinued Gildardo Griffes. Aumiller "Skip"'s Magnesium and DUEXIS. I am also having him start on candesartan. Additionally, I am having him maintain his aspirin, rosuvastatin, Magnesium Oxide, Vitamin D (Ergocalciferol), omeprazole, zolpidem, spironolactone, carvedilol, and SYNTHROID.  Meds ordered this encounter  Medications  . candesartan (ATACAND) 16 MG tablet    Sig: Take 1 tablet (16 mg total) by mouth daily.     Dispense:  90 tablet    Refill:  0   I have reviewed and agree Follow-up: Return in about 6 weeks (around 06/05/2018).  Scarlette Calico, MD

## 2018-04-25 ENCOUNTER — Ambulatory Visit: Admit: 2018-04-25 | Payer: Medicare PPO | Attending: Specialist | Admitting: Specialist

## 2018-04-25 ENCOUNTER — Ambulatory Visit: Admitting: Specialist

## 2018-04-25 ENCOUNTER — Ambulatory Visit (HOSPITAL_BASED_OUTPATIENT_CLINIC_OR_DEPARTMENT_OTHER)

## 2018-04-25 ENCOUNTER — Ambulatory Visit

## 2018-04-25 DIAGNOSIS — H35342 Macular cyst, hole, or pseudohole, left eye: Principal | ICD-10-CM

## 2018-04-25 DIAGNOSIS — H2513 Age-related nuclear cataract, bilateral: Secondary | ICD-10-CM

## 2018-04-26 ENCOUNTER — Encounter: Payer: Self-pay | Admitting: Internal Medicine

## 2018-04-26 ENCOUNTER — Other Ambulatory Visit (INDEPENDENT_AMBULATORY_CARE_PROVIDER_SITE_OTHER): Payer: Medicare Other

## 2018-04-26 DIAGNOSIS — E785 Hyperlipidemia, unspecified: Secondary | ICD-10-CM | POA: Diagnosis not present

## 2018-04-26 LAB — PROTEIN ELECTROPHORESIS, SERUM
ALPHA 2: 0.8 g/dL (ref 0.5–0.9)
Albumin ELP: 4 g/dL (ref 3.8–4.8)
Alpha 1: 0.3 g/dL (ref 0.2–0.3)
Beta 2: 0.4 g/dL (ref 0.2–0.5)
Beta Globulin: 0.5 g/dL (ref 0.4–0.6)
Gamma Globulin: 1.1 g/dL (ref 0.8–1.7)
Total Protein: 7.1 g/dL (ref 6.1–8.1)

## 2018-04-26 LAB — LEAD, BLOOD (ADULT >= 16 YRS): Lead: 22 ug/dL — ABNORMAL HIGH (ref <5)

## 2018-04-26 LAB — LIPID PANEL
Cholesterol: 180 mg/dL (ref 0–200)
HDL: 89.7 mg/dL (ref >39.00)
LDL Cholesterol: 78 mg/dL (ref 0–99)
NonHDL: 90
Total CHOL/HDL Ratio: 2
Triglycerides: 61 mg/dL (ref 0.0–149.0)
VLDL: 12.2 mg/dL (ref 0.0–40.0)

## 2018-04-26 LAB — VITAMIN B1: Vitamin B1 (Thiamine): 7 nmol/L — ABNORMAL LOW (ref 8–30)

## 2018-04-26 LAB — HIV ANTIBODY (ROUTINE TESTING W REFLEX): HIV 1&2 Ab, 4th Generation: NONREACTIVE

## 2018-04-27 ENCOUNTER — Encounter: Payer: Self-pay | Admitting: Internal Medicine

## 2018-04-27 ENCOUNTER — Other Ambulatory Visit: Payer: Self-pay | Admitting: Internal Medicine

## 2018-04-27 DIAGNOSIS — E519 Thiamine deficiency, unspecified: Secondary | ICD-10-CM | POA: Insufficient documentation

## 2018-04-27 DIAGNOSIS — F04 Amnestic disorder due to known physiological condition: Principal | ICD-10-CM

## 2018-04-27 DIAGNOSIS — E538 Deficiency of other specified B group vitamins: Secondary | ICD-10-CM

## 2018-04-27 MED ORDER — THIAMINE HCL 50 MG PO TABS: 50.0000 mg | ORAL_TABLET | Freq: Every day | ORAL | 1 refills | Status: DC

## 2018-04-29 LAB — HEMOGLOBIN A1C: HEMOGLOBIN A1C % (INT/EXT): 5.1 % (ref 4.6–5.6)

## 2018-04-29 LAB — PSA SCREENING (EXT): PSA (EXT): 3.4 ng/mL (ref 0.0–5.0)

## 2018-04-29 LAB — LDL (EXT): LDL Cholesterol (EXT): 159 mg/dL — ABNORMAL HIGH (ref <=130)

## 2018-05-01 ENCOUNTER — Encounter: Payer: Self-pay | Admitting: Internal Medicine

## 2018-05-02 ENCOUNTER — Other Ambulatory Visit: Payer: Self-pay | Admitting: Internal Medicine

## 2018-05-03 ENCOUNTER — Ambulatory Visit

## 2018-05-06 ENCOUNTER — Ambulatory Visit: Admit: 2018-05-06 | Payer: Medicare PPO | Attending: Specialist | Admitting: Specialist

## 2018-05-06 ENCOUNTER — Ambulatory Visit: Admitting: Specialist

## 2018-05-06 DIAGNOSIS — Z87891 Personal history of nicotine dependence: Secondary | ICD-10-CM

## 2018-05-06 DIAGNOSIS — H35342 Macular cyst, hole, or pseudohole, left eye: Principal | ICD-10-CM

## 2018-05-06 DIAGNOSIS — H2512 Age-related nuclear cataract, left eye: Secondary | ICD-10-CM

## 2018-05-06 NOTE — Op Note (Signed)
 Patient    [name] [name], [name] [name]           Med Rec #:  [id number]  Name:  Operation  05/06/2018                Pt.  Dt:                                  Location:  .  Marland Kitchen                               OPERATIVE REPORT  .  Marland Kitchen  PREOPERATIVE DIAGNOSIS:  Full thickness macular hole left eye, cataract  left eye.  Marland Kitchen  POSTOPERATIVE DIAGNOSIS:  Full thickness macular hole left eye, cataract  left eye.  Marland Kitchen  NAME OF OPERATION:  A 25-gauge pars plana vitrectomy, Kenalog, indocyanine  green, membrane peel, and 25% SF6 gas, left eye.  .  ATTENDING SURGEON:  Marisue Ivan, M.D.  .  ASSISTANT SURGEON:  Roselyn Reef, M.D.  .  ANESTHESIA:  General anesthesia.  .  COMPLICATIONS:  None.  .  ESTIMATED BLOOD LOSS:  Minimal.  .  SPECIMENS:  None.  .  INDICATIONS FOR PROCEDURE:  This is a 68 year old gentleman with decreased  vision due to a macular hole.  The risks, benefits, and alternatives of  surgery were discussed in length and all questions were answered.  The  patient elected to proceed with surgery and informed consent was obtained.  .  DESCRIPTION OF PROCEDURE:  The patient was brought to the preoperative  area.  The operative eye was marked as the correct surgical site and  premedicated with the standard regimen of dilating drops.  The patient was  brought to the operating room.  The operative eye was confirmed to be the  correct eye and the nonoperative eye was shielded.  Time out was performed  and a peribulbar block was administered to the operative eye in a standard  fashion with monitored IV sedation without complication.  General  anesthesia was induced by the anesthesia team.  The patient was prepped and  draped in the usual sterile fashion for ophthalmic surgery.  An eyelid  speculum was placed.  Three 25-gauge trocars were inserted 4 mm posteriorly  from the limbus infratemporally, supratemporally, and supranasally.  The  infusion line was secured at the infratemporal cannula and turned on after  the tip was verified to be within  the vitreous cavity.  Light pipe and  vitrector were placed in the eye.  Intraoperative examination was  significant for a full thickness macular hole.  Pars plana vitrectomy was  performed.  A PVD was induced with the assistance of Kenalog.  The vitreous  was cut for 360 degrees.  ICG dye was used in fluid filled eye to stain the  ILM.  All of the ICG dye was removed.  Thereafter, the macular lens was  used to visualize the macula.  Forceps were used to create an edge and peel  the internal limiting membrane in the macula.  Complete air-fluid exchange  was done, 25% SF6 gas was exchanged for air.  Scleral depression was used  to inspect the peripheral retina and no retinal breaks were observed.  All  of the instruments were removed.  All the cannulas were removed and the  sclerotomy was sealed.  Intraocular pressure was palpated and found  to be  within the physiologic range.  Subconjunctival dexamethasone and Kefzol  were administered.  The surface of the eye was treated with Betadine.  The  surface of the eye was rinsed once again.  The speculum and drapes were  removed.  The antibiotic ointment was placed on the ocular surface of the  eye followed by a double patch and shield.  .  .  .  Electronically Signed  Gevena Cotton, MD 05/09/2018 03:13 P  .  .  .  .  Dictated by: Roselyn Reef, MD  .  D:    05/06/2018  T:    05/06/2018 02:00 P  Dictation ID:  [id number]/Doc#  [id number]  .  cc:  .  Marland Kitchen      Document is preliminary until electronically or manually signed by                             attending physician.

## 2018-05-07 ENCOUNTER — Ambulatory Visit: Admit: 2018-05-07 | Payer: Medicare PPO | Attending: Specialist | Admitting: Specialist

## 2018-05-07 ENCOUNTER — Ambulatory Visit (HOSPITAL_BASED_OUTPATIENT_CLINIC_OR_DEPARTMENT_OTHER)

## 2018-05-07 ENCOUNTER — Ambulatory Visit: Admitting: Specialist

## 2018-05-07 DIAGNOSIS — H35342 Macular cyst, hole, or pseudohole, left eye: Principal | ICD-10-CM

## 2018-05-08 ENCOUNTER — Encounter: Payer: Self-pay | Admitting: Internal Medicine

## 2018-05-08 DIAGNOSIS — E785 Hyperlipidemia, unspecified: Secondary | ICD-10-CM

## 2018-05-08 DIAGNOSIS — I712 Thoracic aortic aneurysm, without rupture, unspecified: Secondary | ICD-10-CM

## 2018-05-09 MED ORDER — ROSUVASTATIN CALCIUM 20 MG PO TABS: 20.0000 mg | ORAL_TABLET | Freq: Every day | ORAL | 1 refills | Status: DC

## 2018-05-13 LAB — HX SURGICAL

## 2018-05-14 ENCOUNTER — Ambulatory Visit: Admit: 2018-05-14 | Payer: Medicare PPO | Attending: Specialist | Admitting: Specialist

## 2018-05-14 ENCOUNTER — Ambulatory Visit (HOSPITAL_BASED_OUTPATIENT_CLINIC_OR_DEPARTMENT_OTHER)

## 2018-05-14 ENCOUNTER — Ambulatory Visit: Admitting: Specialist

## 2018-05-14 DIAGNOSIS — H35342 Macular cyst, hole, or pseudohole, left eye: Principal | ICD-10-CM

## 2018-05-20 ENCOUNTER — Ambulatory Visit

## 2018-05-22 ENCOUNTER — Ambulatory Visit

## 2018-05-24 ENCOUNTER — Other Ambulatory Visit: Payer: Medicare Other

## 2018-05-24 ENCOUNTER — Encounter: Payer: Self-pay | Admitting: Internal Medicine

## 2018-05-24 ENCOUNTER — Ambulatory Visit (INDEPENDENT_AMBULATORY_CARE_PROVIDER_SITE_OTHER): Payer: Medicare Other | Admitting: Internal Medicine

## 2018-05-24 VITALS — BP 130/88 | HR 60 | Temp 97.7°F | Resp 16 | Ht 70.0 in | Wt 186.5 lb

## 2018-05-24 DIAGNOSIS — I10 Essential (primary) hypertension: Secondary | ICD-10-CM | POA: Diagnosis not present

## 2018-05-24 DIAGNOSIS — Z77011 Contact with and (suspected) exposure to lead: Secondary | ICD-10-CM | POA: Diagnosis not present

## 2018-05-24 NOTE — Progress Notes (Signed)
Subjective:  Patient ID: Brandon [name], male    DOB: 12-05-1950  Age: 68 y.o. MRN: [id number]  CC: Hypertension   HPI BRANTLY KALMAN presents for f/up - He tells me his systolic blood pressure was dropping down into the teens so he stopped taking spironolactone.  He tells me his blood pressure has been well controlled with candesartan and carvedilol.  He wants to recheck his lead level.  Outpatient Medications Prior to Visit  Medication Sig Dispense Refill  . aspirin 81 MG tablet Take 81 mg by mouth daily.    . candesartan (ATACAND) 16 MG tablet Take 1 tablet (16 mg total) by mouth daily. 90 tablet 0  . carvedilol (COREG) 6.25 MG tablet TAKE 1 TABLET TWICE A DAY  WITH MEALS 180 tablet 0  . Magnesium Oxide 250 MG TABS TAKE 1 TABLET BY MOUTH TWICE A DAY 180 tablet 1  . omeprazole (PRILOSEC) 40 MG capsule Take 1 capsule (40 mg total) by mouth daily. 90 capsule 1  . rosuvastatin (CRESTOR) 20 MG tablet Take 1 tablet (20 mg total) by mouth daily. 90 tablet 1  . SYNTHROID 100 MCG tablet TAKE 1 TABLET DAILY 90 tablet 0  . thiamine 50 MG tablet Take 1 tablet (50 mg total) by mouth daily. 90 tablet 1  . Vitamin D, Ergocalciferol, (DRISDOL) 50000 units CAPS capsule TAKE 1 CAPSULE (50,000 UNITS TOTAL) BY MOUTH EVERY 7 (SEVEN) DAYS. 12 capsule 0  . zolpidem (AMBIEN) 10 MG tablet TAKE 1 TABLET AT BEDTIME ASNEEDED 90 tablet 0  . spironolactone (ALDACTONE) 25 MG tablet Take 1 tablet (25 mg total) by mouth daily. (Patient not taking: Reported on 05/24/2018) 90 tablet 0   No facility-administered medications prior to visit.     ROS Review of Systems  Constitutional: Negative for diaphoresis, fatigue and unexpected weight change.  HENT: Negative.   Eyes: Negative for visual disturbance.  Respiratory: Negative for cough, chest tightness, shortness of breath and wheezing.   Cardiovascular: Negative for chest pain, palpitations and leg swelling.  Gastrointestinal: Negative for abdominal pain and  nausea.  Endocrine: Negative.   Genitourinary: Negative.  Negative for difficulty urinating and dysuria.  Musculoskeletal: Negative.   Skin: Negative.   Neurological: Negative.  Negative for dizziness, weakness, light-headedness, numbness and headaches.  Hematological: Negative for adenopathy. Does not bruise/bleed easily.  Psychiatric/Behavioral: Negative.     Objective:  BP 130/88 (BP Location: Left Arm, Patient Position: Sitting, Cuff Size: Normal)   Pulse 60   Temp 97.7 F (36.5 C) (Oral)   Resp 16   Ht 5\' 10"  (1.778 m)   Wt 186 lb 8 oz (84.6 kg)   SpO2 98%   BMI 26.76 kg/m   BP Readings from Last 3 Encounters:  05/24/18 130/88  04/24/18 (!) 162/102  12/17/17 130/90    Wt Readings from Last 3 Encounters:  05/24/18 186 lb 8 oz (84.6 kg)  04/24/18 180 lb 8 oz (81.9 kg)  12/17/17 176 lb (79.8 kg)    Physical Exam Vitals signs reviewed.  HENT:     Nose: Nose normal. No congestion or rhinorrhea.     Mouth/Throat:     Mouth: Mucous membranes are moist.     Pharynx: Oropharynx is clear. No oropharyngeal exudate or posterior oropharyngeal erythema.  Eyes:     General: No scleral icterus.    Conjunctiva/sclera: Conjunctivae normal.  Neck:     Musculoskeletal: Normal range of motion and neck supple. No neck rigidity or muscular tenderness.  Cardiovascular:  Rate and Rhythm: Normal rate and regular rhythm.     Pulses: Normal pulses.     Heart sounds: No murmur. No gallop.   Pulmonary:     Effort: Pulmonary effort is normal. No respiratory distress.     Breath sounds: Normal breath sounds. No stridor. No wheezing, rhonchi or rales.  Abdominal:     General: Abdomen is flat. Bowel sounds are normal. There is no distension.     Palpations: There is no mass.     Tenderness: There is no abdominal tenderness. There is no guarding.  Musculoskeletal: Normal range of motion.        General: No swelling.     Right lower leg: No edema.     Left lower leg: No edema.   Skin:    General: Skin is warm and dry.     Coloration: Skin is not pale.  Neurological:     General: No focal deficit present.     Mental Status: He is oriented to person, place, and time. Mental status is at baseline.     Lab Results  Component Value Date   WBC 6.1 04/24/2018   HGB 16.3 04/24/2018   HCT 47.4 04/24/2018   PLT 310.0 04/24/2018   GLUCOSE 100 (H) 04/24/2018   CHOL 180 04/26/2018   TRIG 61.0 04/26/2018   HDL 89.70 04/26/2018   LDLDIRECT 147.7 12/03/2007   LDLCALC 78 04/26/2018   ALT 27 04/24/2018   AST 33 04/24/2018   NA 140 04/24/2018   K 4.4 04/24/2018   CL 102 04/24/2018   CREATININE 1.20 04/24/2018   BUN 27 (H) 04/24/2018   CO2 28 04/24/2018   TSH 2.45 03/22/2018   PSA 0.80 04/24/2018   INR 1.1 (H) 04/24/2018   HGBA1C 5.5 02/20/2017    Ct Angio Chest Aorta W/cm &/or Wo/cm  Result Date: 04/01/2018 CLINICAL DATA:  68 year old male with a history of thoracic aortic aneurysm EXAM: CT ANGIOGRAPHY CHEST WITH CONTRAST TECHNIQUE: Multidetector CT imaging of the chest was performed using the standard protocol during bolus administration of intravenous contrast. Multiplanar CT image reconstructions and MIPs were obtained to evaluate the vascular anatomy. CONTRAST:  70mL ISOVUE-370 IOPAMIDOL (ISOVUE-370) INJECTION 76% COMPARISON:  Prior CTA chest 03/09/2017 and 11/13/2011 FINDINGS: Cardiovascular: The aortic root remains normal in caliber. No effacement of the sino-tubular junction. Mild aneurysmal dilatation of the tubular portion of the ascending thoracic aorta again noted with a maximal transverse diameter of 4.1 cm. No significant interval progression. No evidence of dissection. Conventional 3 vessel arch anatomy. Mild elongation of the infundibulum and descending thoracic aorta. Scattered mild atherosclerotic vascular calcifications. Mediastinum/Nodes: Similar appearance of conspicuous mediastinal lymph nodes which are increased in number but only borderline  increased in size. A prevascular lymph node is stable at 1.0 cm (image 32, series 4). An AP window node is stable at 1.1 cm (image 42, series 4). Comparing to more remote prior imaging from August of 2013, there has been slight interval progression of the mediastinal lymphadenopathy. The visualized esophagus is unremarkable. Hypoplastic thyroid gland. Lungs/Pleura: Biapical pleuroparenchymal scarring. Mild centrilobular pulmonary emphysema. No suspicious pulmonary nodule or mass. No pleural effusion or pneumothorax. Upper Abdomen: Giant left upper pole renal cyst measuring up to 10 cm in diameter. Otherwise, no significant abnormality visualized in the upper abdomen. Musculoskeletal: No acute fracture or aggressive appearing lytic or blastic osseous lesion. Review of the MIP images confirms the above findings. IMPRESSION: 1. Stable mild fusiform aneurysmal dilatation of the tubular portion of the  ascending thoracic aorta with a maximal diameter of 4.1 cm. Typically, multi societal recommendations would suggest continued annual surveillance with CTA or MRA imaging. However, this gentleman's aorta remains stable in size for the past 6 years dating back to 2013. Perhaps surveillance every 2-3 years could be considered. Aortic aneurysm NOS (ICD10-I71.9); Aortic Atherosclerosis (ICD10-170.0) 2. Similar appearance of multifocal mediastinal and hilar lymphadenopathy which remains unchanged compared to recent prior imaging but demonstrates slow progression compared to more remote prior imaging from 2013. While lymphadenopathy may simply be chronic and reactive in nature, and indolent lymphoproliferative process such as CLL is also a consideration. 3. Giant left upper pole renal cyst again noted. 4. Mild centrilobular emphysema.  Emphysema (ICD10-J43.9). Signed, Criselda Peaches, MD, [facility] Rad[facility] Electronically Signed   By: Jacqulynn Cadet M.D.   On:  04/01/2018 12:16    Assessment & Plan:   Colum was seen today for hypertension.  Diagnoses and all orders for this visit:  Essential hypertension, benign- His blood pressure is adequately well controlled on candesartan and carvedilol.  Will discontinue the spironolactone.  His electrolytes are normal and his renal function is stable.. -     Basic metabolic panel; Future  Lead exposure- I will recheck his lead level.  I have asked him to avoid exposure to lead products. -     Lead, blood; Future   I have discontinued Gildardo Griffes. Frisbie "Skip"'s spironolactone. I am also having him maintain his aspirin, Magnesium Oxide, Vitamin D (Ergocalciferol), omeprazole, zolpidem, carvedilol, SYNTHROID, candesartan, thiamine, and rosuvastatin.  No orders of the defined types were placed in this encounter.    Follow-up: Return in about 6 months (around 11/22/2018).  Scarlette Calico, MD

## 2018-05-24 NOTE — Patient Instructions (Signed)

## 2018-05-27 ENCOUNTER — Encounter: Payer: Self-pay | Admitting: Internal Medicine

## 2018-05-27 LAB — LEAD, BLOOD (ADULT >= 16 YRS): Lead: 20 ug/dL — ABNORMAL HIGH (ref <5)

## 2018-06-20 ENCOUNTER — Other Ambulatory Visit: Payer: Self-pay | Admitting: Internal Medicine

## 2018-06-20 ENCOUNTER — Encounter: Payer: Self-pay | Admitting: Internal Medicine

## 2018-06-20 DIAGNOSIS — I10 Essential (primary) hypertension: Secondary | ICD-10-CM

## 2018-06-21 ENCOUNTER — Other Ambulatory Visit: Payer: Self-pay | Admitting: Internal Medicine

## 2018-06-21 DIAGNOSIS — I10 Essential (primary) hypertension: Secondary | ICD-10-CM

## 2018-06-21 DIAGNOSIS — F5101 Primary insomnia: Secondary | ICD-10-CM

## 2018-06-21 MED ORDER — CANDESARTAN CILEXETIL 16 MG PO TABS: 16.0000 mg | ORAL_TABLET | Freq: Every day | ORAL | 1 refills | Status: DC

## 2018-06-21 MED ORDER — ZOLPIDEM TARTRATE 10 MG PO TABS: ORAL_TABLET | ORAL | 1 refills | Status: DC

## 2018-06-25 ENCOUNTER — Ambulatory Visit: Admit: 2018-06-25 | Payer: Medicare PPO | Attending: Specialist | Admitting: Specialist

## 2018-06-25 ENCOUNTER — Ambulatory Visit (HOSPITAL_BASED_OUTPATIENT_CLINIC_OR_DEPARTMENT_OTHER)

## 2018-06-25 ENCOUNTER — Ambulatory Visit: Admitting: Specialist

## 2018-06-25 DIAGNOSIS — H35342 Macular cyst, hole, or pseudohole, left eye: Principal | ICD-10-CM

## 2018-06-28 ENCOUNTER — Ambulatory Visit

## 2018-07-01 ENCOUNTER — Other Ambulatory Visit: Payer: Self-pay | Admitting: Internal Medicine

## 2018-07-01 ENCOUNTER — Encounter: Payer: Self-pay | Admitting: Internal Medicine

## 2018-07-01 DIAGNOSIS — K219 Gastro-esophageal reflux disease without esophagitis: Secondary | ICD-10-CM

## 2018-07-01 MED ORDER — OMEPRAZOLE 40 MG PO CPDR: 40.0000 mg | DELAYED_RELEASE_CAPSULE | Freq: Every day | ORAL | 1 refills | Status: DC

## 2018-07-03 ENCOUNTER — Ambulatory Visit: Admit: 2018-07-03 | Payer: Medicare PPO

## 2018-07-03 ENCOUNTER — Ambulatory Visit: Admitting: Neurology

## 2018-07-03 ENCOUNTER — Other Ambulatory Visit: Payer: Self-pay | Admitting: Internal Medicine

## 2018-07-03 DIAGNOSIS — I712 Thoracic aortic aneurysm, without rupture, unspecified: Secondary | ICD-10-CM

## 2018-07-03 DIAGNOSIS — I10 Essential (primary) hypertension: Secondary | ICD-10-CM

## 2018-07-03 DIAGNOSIS — E039 Hypothyroidism, unspecified: Secondary | ICD-10-CM

## 2018-07-03 DIAGNOSIS — Z0000 Encounter for general adult medical examination without abnormal findings: Principal | ICD-10-CM

## 2018-07-03 NOTE — Progress Notes (Signed)
 * * *      **Gurganus, [name]**    ------    68 Y old Male, DOB: 1950-11-19, External MRN: [id number]    Account Number: [id number]    [address] [place], [id number]    Home: [phone]    Insurance: N78 [employer] ADV PPO    PCP: Alessandra Grout, MD Referring: Alessandra Grout, MD    Appointment Facility: Neurology        * * *    07/03/2018 Progress Notes: [name] **CHN#:** [id number]    ------    ---       **Reason for Appointment**    ---      1\. Phone visit/no video    ---      **History of Present Illness**    ---     _GENERAL_ :    Dear Referring providers- many thanks for sending this patient to [facility]-he is a Patient with seizure disorder- levels a bit labile- recently  was on Phenytoin 390 mg no seizures, level was 11/ repeating level next week-  aiming for a bit higher JO.      **Current Medications**    ---    Taking    * Aspirin 81 MG Tablet Chewable 1 tablet Orally Once a day    ---    * Atorvastatin Calcium 10 MG Tablet 1 tablet Orally Once a day    ---    * CoQ10     ---    * Dilantin 100 mg Capsule TAKE 5 CAPSULES BY MOUTH EVERY MORNING WITH THE 30MG  Orally- NAME BRAND DAW MEDICALLY NECESSARY , Notes: 560 mg/day- dopped to 390 mg/day as of 07/03/2018    ---    * Dilantin 30 mg Capsule as directed Orally sig up to 3 tablets daily may undergo titration    ---    * Omeprazole 20 MG Capsule Delayed Release 1 capsule Orally Once a day    ---    * Potassium Chloride 20 MEQ Packet 1 packet with food Orally Once a day    ---    * Medication List reviewed and reconciled with the patient    ---      **Past Medical History**    ---      See notes- seizures/cardiovascular disease.        ---    Seizures.        ---      **Surgical History**    ---      No Surgical History documented.    ---      **Family History**    ---      No FH seizures.    ---      **Social History**    ---    Tobacco    history: _Never smoked_  no alcohol or illicits.    ---       **Allergies**    ---      N.K.D.A.    ---      **Hospitalization/Major Diagnostic Procedure**    ---      No Hospitalization History.    ---      **Review of Systems**    ---     _GENERAL_ :    Outpatient questionnaire reviewed. Constitutional No concerns.  Hematologic/Lymphatic No lumps in the neck, lumps in the groin, gingival  bleeding, epistaxis. Negative for: negative . Positive for: negative . Eyes  negative . ENT negative .  Cardiovascular No concerns. Respiratory No concerns.  Gastrointestinal No concerns.         **Physical Examination**    ---    Conversational exam not possible.      **Assessments**    ---    1\. Seizure disorder - G33.909 (Primary)    ---     68 year old with seizure disorder, idiopathic, extensive followup and  documentation over the years, level Dilantin 11- on 390 mg Dilantin daily,  level to be repeated within 2 weeks    plan ; seiuzre/safety precautions, address after dilantin level, he will call  in 3 days after level if does not hear back from [place] all quesitons answered [name].    ---      **Follow Up**    ---    6 Months    Electronically signed by [name] , MD on 07/03/2018 at 01:03 PM EDT    Sign off status: Completed        * * *        Neurology    [address]    [place], [address]    [place], [place] [address]    Tel: [phone]    Fax: [phone]              * * *         Patient: [name] [name], [name] [name] DOB: 06-27-1950 Progress Note: Relda Agosto  07/03/2018    ---    Note generated by eClinicalWorks EMR/PM Software ([email or web address])

## 2018-07-03 NOTE — Progress Notes (Signed)
.    Progress Notes  .  Patient: [name] [name], [name] [name]  Provider: [name]    .  DOB: Dec 12, 1950 Age: 68 Y Sex: Male  .  PCP: Alessandra Grout MD  Date: 07/03/2018  .  --------------------------------------------------------------------------------  .  REASON FOR APPOINTMENT  .  1. Phone visit/no video  .  HISTORY OF PRESENT ILLNESS  .  GENERAL:   Dear Referring providers- many thanks for sending this patient  to [facility]-he is a Patient with seizure disorder- levels  a bit labile- recently was on Phenytoin 390 mg no seizures, level  was 11/ repeating level next week- aiming for a bit higher JO.  Marland Kitchen  CURRENT MEDICATIONS  .  Taking Aspirin 81 MG Tablet Chewable 1 tablet Orally Once a day  Taking Atorvastatin Calcium 10 MG Tablet 1 tablet Orally Once a  day  Taking CoQ10  Taking Dilantin 100 mg Capsule TAKE 5 CAPSULES BY MOUTH EVERY  MORNING WITH THE 30MG  Orally- NAME BRAND DAW MEDICALLY NECESSARY  , Notes: 560 mg/day- dopped to 390 mg/day as of 07/03/2018  Taking Dilantin 30 mg Capsule as directed Orally sig up to 3  tablets daily may undergo titration  Taking Omeprazole 20 MG Capsule Delayed Release 1 capsule Orally  Once a day  Taking Potassium Chloride 20 MEQ Packet 1 packet with food Orally  Once a day  Medication List reviewed and reconciled with the patient  .  PAST MEDICAL HISTORY  .  See notes- seizures/cardiovascular disease  Seizures  .  ALLERGIES  .  N.K.D.A.  .  SURGICAL HISTORY  .  No Surgical History documented.  Marland Kitchen  FAMILY HISTORY  .  No FH seizures.  .  SOCIAL HISTORY  .  .  Tobacco  history:Never smoked  .  no alcohol or illicits.  .  HOSPITALIZATION/MAJOR DIAGNOSTIC PROCEDURE  .  No Hospitalization History.  Marland Kitchen  REVIEW OF SYSTEMS  .  GENERAL:  .  Outpatient questionnaire reviewed. Constitutional    No concerns  . Hematologic/Lymphatic    No lumps in the neck, lumps in the  groin, gingival bleeding, epistaxis . Negative for:    negative   . Positive for:    negative  . Eyes    negative  . ENT      negative  . Cardiovascular    No concerns . Respiratory    No  concerns . Gastrointestinal    No concerns .  Marland Kitchen  PHYSICAL EXAMINATION  .  Conversational exam not possible.  .  ASSESSMENTS  .  Seizure disorder - G40.909 (Primary)  .  68 year old with seizure disorder, idiopathic, extensive followup  and documentation over the years, level Dilantin 11- on 390 mg  Dilantin daily, level to be repeated within 2 weeksplan ;  seiuzre/safety precautions, address after dilantin level, he will  call in 3 days after level if does not hear back from [place] all  quesitons answered [name].  .  FOLLOW UP  .  6 Months  .  Electronically signed by [name] , MD on  07/03/2018 at 01:03 PM EDT  .  Document electronically signed by [name]    .

## 2018-07-16 ENCOUNTER — Ambulatory Visit: Admitting: Neurology

## 2018-07-16 MED ORDER — Dilantin: 100 | 450 | 3 refills | 0 days | Status: AC

## 2018-08-12 ENCOUNTER — Ambulatory Visit: Admitting: Neurology

## 2018-08-12 NOTE — Progress Notes (Signed)
* * *      [name] [name], [name] [name] **DOB:** May 11, 1950 [phone] yo M) **Acc No.** [id number] **DOS:**  08/12/2018    ---       **Strausser, [name]**    ------    37 Y old Male, DOB: 1950/04/15    [address], [place], [place] [address]    Home: [phone]    Provider: [name]        * * *    Telephone Encounter    ---    Answered by  Driscilla Moats Date: 08/12/2018       Time: 11:43 AM    Caller  patient    ------            Reason  Needs labs tracked down            Message                     good morning,            patient is wondering if he could lower his medicine of Dilantin 100 mg Capsule. He is hoping that his blood test was sent last week from [facility]. He could be reached at [phone].            thank you                Action Taken                     Almendras,Marilyn  08/12/2018 11:46:04 AM >      Ariahna Smiddy  08/12/2018 5:22:40 PM > no labs yet- can you let him know I/we are on the lookout thx Junious Silk  08/13/2018 1:32:07 PM > spoke with him- awaiting level/ can you pls track this down or delegate that/find out/ dropped dilantin from 390 to 360/day was woozy now better [employer]  08/14/2018 12:26:01 PM > last labs in [facility] are from March 16(Dil level was 11).  called patient asked him for a call back need to know when most recent labs were done and where.      Benoit,Kyara  08/14/2018 12:44:14 PM > Patient has received your called Corrie Dandy, he can be reached back at [phone], thank you.      Tahj Lindseth  08/14/2018 4:10:01 PM > Unsure when [facility] in again- can you pls track this Dilantin level down was done within the last 10 days or so thx JO      [name],[name]  08/15/2018 9:37:43 AM > called and lab will be sending over results. thanks DEZ                    * * *                ---          * * *         Provider: [name], Twala Collings 08/12/2018    ---    Note generated by eClinicalWorks EMR/PM Software ([email or web address])

## 2018-08-15 ENCOUNTER — Ambulatory Visit

## 2018-08-15 ENCOUNTER — Ambulatory Visit: Admitting: Neurology

## 2018-08-15 NOTE — Progress Notes (Signed)
* * *      [name], [name] HRUSKA **DOB:** 25-Jan-1951 [phone] yo M) **Acc No.** [id number] **DOS:**  08/15/2018    ---       **[name] [name], [name] [name]**    ------    68 Y old Male, DOB: April 09, 1951    [address] [place], [place] [address]    Home: [phone]    Provider: [name]        * * *    Telephone Encounter    ---    Answered by  [name] Date: 08/15/2018       Time: 10:05 AM    Action Taken                     [name]  08/15/2018 10:06:00 AM > Pht- 18.6 from 4/29- will get another level this week/ will call/contact if we do not address by Mon/tues or if issues JO         ------                * * *                ---          * * *         Provider: [name], [name] 08/15/2018    ---    Note generated by eClinicalWorks EMR/PM Software ([email or web address])

## 2018-08-16 ENCOUNTER — Ambulatory Visit: Admitting: Neurology

## 2018-08-16 NOTE — Progress Notes (Signed)
* * *      Mccullum, PETRO TALENT **DOB:** 02-15-1951 [phone] yo M) **Acc No.** [id number] **DOS:**  08/16/2018    ---       **Mcmanamon, [name]**    ------    68 Y old Male, DOB: Jul 29, 1950    [address], [place], [place] [address]    Home: [phone]    Provider: [name]        * * *    Telephone Encounter    ---    Answered by  [name] Date: 08/16/2018       Time: 12:11 PM    Reason  Pls track down another dilantin level    ------            Action Taken                     Mirren Gest  08/16/2018 12:12:14 PM >       Tynisa Vohs  08/16/2018 12:12:15 PM > from yesterday- many thx JO      Stephannie Broner  08/16/2018 12:14:57 PM > 17.5 will repeat 1-2 weeks communicated with patient JO                    * * *                ---          * * *         Provider: Tomeshia Pizzi 08/16/2018    ---    Note generated by eClinicalWorks EMR/PM Software ([email or web address])

## 2018-08-29 ENCOUNTER — Ambulatory Visit: Admitting: Neurology

## 2018-08-29 NOTE — Progress Notes (Signed)
* * *      [name], [name] N **DOB:** 11-09-1950 [phone] yo M) **Acc No.** [id number] **DOS:**  08/29/2018    ---       **[name], [name]**    ------    46 Y old Male, DOB: May 02, 1950    [address], [place], [place] [address]    Home: [phone]    Provider: [name]        * * *    Telephone Encounter    ---    Answered by  [name] Date: 08/29/2018       Time: 10:45 AM    Reason  Pls track down dilantin level 5/20 thx    ------            Action Taken                     [name] [name]  08/29/2018 10:45:51 AM > pls track down Dilantin level from 5/20- thx JO      Myleen Brailsford  08/30/2018 8:57:26 AM > 11.1      [name] [name]  08/30/2018 9:08:20 AM > spoke with patient increasing +30 mg/day to 390 mg total/day repeat level next weds/thurs jo                    * * *                ---          * * *         Provider: [name], Lue Dubuque 08/29/2018    ---    Note generated by eClinicalWorks EMR/PM Software ([email or web address])

## 2018-09-05 ENCOUNTER — Ambulatory Visit: Admitting: Neurology

## 2018-09-05 NOTE — Progress Notes (Signed)
* * *      Brege, HUDSON MAJKOWSKI **DOB:** 1950/06/16 [phone] yo M) **Acc No.** [id number] **DOS:**  09/05/2018    ---       **Wendel, [name]**    ------    89 Y old Male, DOB: 1951/01/18    [address], [place], [place] [address]    Home: [phone]    Provider: [name]        * * *    Telephone Encounter    ---    Answered by  [name] Date: 09/05/2018       Time: 08:58 AM    Action Taken                     Christia Domke  09/05/2018 8:58:47 AM > says he had dilantin level 2 days ago in email -[facility] -can you pks track down thx JO      [name],[name]  09/09/2018 3:28:42 PM > they are faxing over to [place] now. thanks Dez        ------                * * *                ---          * * *         Provider: [name], Mileidy Atkin 09/05/2018    ---    Note generated by eClinicalWorks EMR/PM Software ([email or web address])

## 2018-09-10 ENCOUNTER — Ambulatory Visit

## 2018-09-12 ENCOUNTER — Ambulatory Visit: Admitting: Neurology

## 2018-09-12 NOTE — Progress Notes (Signed)
* * *      [name] [name], [name] [name] **DOB:** May 03, 1950 [phone] yo M) **Acc No.** [id number] **DOS:**  09/12/2018    ---       **[name] [name], [name] [name]**    ------    45 Y old Male, DOB: 12/03/1950    [address] [place], [place] [address]    Home: [phone]    Provider: [name]        * * *    Telephone Encounter    ---    Answered by  [name] Date: 09/12/2018       Time: 05:10 PM    Reason  pls track down dilantin level many thx    ------            Action Taken                     Mathea Frieling  09/12/2018 5:11:05 PM > pls track down dilantin level many thx / LMK JO      [name],[name]  09/13/2018 7:23:00 AM > there are some in his patient docs from 09/10/18 is that what you are looking for ?      Ethell Blatchford  09/13/2018 8:29:21 AM > added on yesterday docs are from a few days ago- apparently had levels drawn even more recently..Delanna Notice      [name],[name]  09/13/2018 8:41:17 AM > called them and they are going to send over now. thanks Dez      [name],[name]  09/13/2018 11:05:51 AM > rsults are in pt doccs. thanks Dez      Gerrett Loman  09/13/2018 11:09:46 AM > Pht level = 12.2- slightly lower am wondering if you should do 390 mg /day 4 days a week and 420 mg /day 3 days a week, repeat in 10-14 days      ?thoughts JO/ email sent to patient [name]                          * * *                ---          * * *         Provider: [name] 09/12/2018    ---    Note generated by eClinicalWorks EMR/PM Software ([email or web address])

## 2018-09-13 ENCOUNTER — Ambulatory Visit: Admitting: Neurology

## 2018-09-13 NOTE — Progress Notes (Signed)
* * *      Coultas, [name] [name] **DOB:** 1950-06-06 [phone] yo M) **Acc No.** [id number] **DOS:**  09/13/2018    ---       **[name], [name] [name]**    ------    12 Y old Male, DOB: [birth year]    [address] [place], [place] [address]    Home: [phone]    Provider: [name]        * * *    Telephone Encounter    ---    Answered by  [name] Date: 09/13/2018       Time: 03:11 PM    Action Taken                     [name] [name]  09/13/2018 3:11:36 PM > spoke with him re dilantin level- wants to try 200 BID, level in 7-10 days JO        ------                * * *                ---          * * *         Provider: [name], [name] [name] 09/13/2018    ---    Note generated by eClinicalWorks EMR/PM Software ([email or web address])

## 2018-09-17 NOTE — Progress Notes (Signed)
[name] [place] [place], [place] [address] Phone: [phone] Subjective:   I Brandon [name] am serving as a Education administrator for Dr. Hulan Saas.   CC: neck pain   [id number]   12/17/2017 Patient seems to be doing well.  Discussed icing regimen and home exercises.  Discussed topical anti-inflammatories.  Discussed proper shoes.  Follow-up again in 8 weeks to make sure completely entirely resolved  09/18/2018 Brandon [name] is a 68 y.o. male coming in with complaint of neck pain. Recently had his too extracted. Heat doesn't help and there is small change with ice.    Onset- 1 month Location - occiput Duration-  Character-aching sensation Aggravating factors- work Reliving factors-  Therapies tried- heat, ice  Severity-5 out of 10     Past Medical History:  Diagnosis Date  . Allergy    mild  . Aortic aneurysm (HCC)    small and watching  . GERD (gastroesophageal reflux disease)   . Head and neck cancer    head and neck ca dx 03/2009  . Hyperlipidemia   . Hypertension   . oropharyngeal ca dx'd 03/2009   chemo/xrt comp 06/2009  . Thyroid disease    Past Surgical History:  Procedure Laterality Date  . HERNIA REPAIR     inguinal  . hip replacemnt left Left   . Left eye    . stmach surgery     repair peg tube site   Social History   Socioeconomic History  . Marital status: Married    Spouse name: Not on file  . Number of children: 2  . Years of education: 76  . Highest education level: Not on file  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 History  . 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 Financial resource strain: Not on file  . Food insecurity:    Worry: Not on file    Inability: Not on file  . Transportation needs:    Medical: Not on file    Non-medical: Not on file  Tobacco Use  . Smoking status: Former Smoker    Packs/day: 1.50    Years: 35.00    Pack years: 52.50    Types: Cigarettes, Cigars    Start date:  05/16/1973    Last attempt to quit: 04/19/2009    Years since quitting: 9.4  . Smokeless tobacco: Never Used  . Tobacco comment: quit smoking 5 years sago  Substance and Sexual Activity  . Alcohol use: Yes    Alcohol/week: 30.0 standard drinks    Types: 30 Shots of liquor per week  . Drug use: No  . Sexual activity: Yes    Partners: Female  Lifestyle  . Physical activity:    Days per week: Not on file    Minutes per session: Not on file  . Stress: Not on file  Relationships  . Social connections:    Talks on phone: Not on file    Gets together: Not on file    Attends religious service: Not on file    Active member of club or organization: Not on file    Attends meetings of clubs or organizations: Not on file    Relationship status: Not on file  Other Topics Concern  . Not on file  Social History Narrative   HSG, [employer]-no degree. married - '76 - 5 years, divorced; remarried  '84. 1 son - '85; 1 daughter '89. 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  Allergies  Allergen Reactions  . Benicar [Olmesartan]     fatigue  . Amlodipine Swelling   Family History  Problem Relation Age of Onset  . Cancer Mother        lymphoma  . Colon cancer Mother   . Cancer Father        lung (smoker)  . Hyperlipidemia Father   . Hypertension Father   . Diabetes Father   . Esophageal cancer Neg Hx   . Rectal cancer Neg Hx   . Stomach cancer Neg Hx     Current Outpatient Medications (Endocrine & Metabolic):  .  SYNTHROID 100 MCG tablet, TAKE 1 TABLET DAILY  Current Outpatient Medications (Cardiovascular):  .  candesartan (ATACAND) 16 MG tablet, Take 1 tablet (16 mg total) by mouth daily. .  carvedilol (COREG) 6.25 MG tablet, TAKE 1 TABLET TWICE A DAY  WITH MEALS .  rosuvastatin (CRESTOR) 20 MG tablet, Take 1 tablet (20 mg total) by mouth daily.   Current Outpatient Medications (Analgesics):  .  aspirin 81 MG tablet, Take 81 mg by mouth daily.   Current  Outpatient Medications (Other):  Marland Kitchen  Magnesium Oxide 250 MG TABS, TAKE 1 TABLET BY MOUTH TWICE A DAY .  omeprazole (PRILOSEC) 40 MG capsule, Take 1 capsule (40 mg total) by mouth daily. Marland Kitchen  thiamine 50 MG tablet, Take 1 tablet (50 mg total) by mouth daily. .  Vitamin D, Ergocalciferol, (DRISDOL) 50000 units CAPS capsule, TAKE 1 CAPSULE (50,000 UNITS TOTAL) BY MOUTH EVERY 7 (SEVEN) DAYS. Marland Kitchen  zolpidem (AMBIEN) 10 MG tablet, TAKE 1 TABLET AT BEDTIME ASNEEDED    Past medical history, social, surgical and family history all reviewed in electronic medical record.  No pertanent information unless stated regarding to the chief complaint.   Review of Systems:  No headache, visual changes, nausea, vomiting, diarrhea, constipation, dizziness, abdominal pain, skin rash, fevers, chills, night sweats, weight loss, swollen lymph nodes, body aches, joint swelling, muscle aches, chest pain, shortness of breath, mood changes.   Objective  There were no vitals taken for this visit. Systems examined below as of    General: No apparent distress alert and oriented x3 mood and affect normal, dressed appropriately.  HEENT: Pupils equal, extraocular movements intact  Respiratory: Patient's speak in full sentences and does not appear short of breath  Cardiovascular: No lower extremity edema, non tender, no erythema  Skin: Warm dry intact with no signs of infection or rash on extremities or on axial skeleton.  Abdomen: Soft nontender  Neuro: Cranial nerves II through XII are intact, neurovascularly intact in all extremities with 2+ DTRs and 2+ pulses.  Lymph: No lymphadenopathy of posterior or anterior cervical chain or axillae bilaterally.  Gait normal with good balance and coordination.  MSK:  Non tender with full range of motion and good stability and symmetric strength and tone of shoulders, elbows, wrist, hip, knee and ankles bilaterally.  Neck: Inspection significant increasing kyphosis.  Patient does have it  in the superior aspect of the thoracic spine. No palpable stepoffs. Negative Spurling's maneuver. Limited range of motion in all planes.  Lacks last 5 degrees of extension Grip strength and sensation normal in bilateral hands Strength good C4 to T1 distribution No sensory change to C4 to T1 Negative Hoffman sign bilaterally Reflexes normal Tightness of the trapezius bilaterally   97110; 15 additional minutes spent for Therapeutic exercises as stated in above notes.  This included exercises focusing on stretching, strengthening, with significant focus on eccentric  aspects.   Long term goals include an improvement in range of motion, strength, endurance as well as avoiding reinjury. Patient's frequency would include in 1-2 times a day, 3-5 times a week for a duration of 6-12 weeks. Exercises that included:  Basic scapular stabilization to include adduction and depression of scapula Scaption, focusing on proper movement and good control Internal and External rotation utilizing a theraband, with elbow tucked at side entire time Rows with theraband which was given   Proper technique shown and discussed handout in great detail with ATC.  All questions were discussed and answered.      Impression and Recommendations:     This case required medical decision making of moderate complexity. The above documentation has been reviewed and is accurate and complete Lyndal Pulley, DO       Note: This dictation was prepared with Dragon dictation along with smaller phrase technology. Any transcriptional errors that result from this process are unintentional.

## 2018-09-18 ENCOUNTER — Other Ambulatory Visit: Payer: Self-pay

## 2018-09-18 ENCOUNTER — Ambulatory Visit (INDEPENDENT_AMBULATORY_CARE_PROVIDER_SITE_OTHER)
Admission: RE | Admit: 2018-09-18 | Discharge: 2018-09-18 | Disposition: A | Discharge status: Home / Self Care | Payer: Medicare Other | Source: Ambulatory Visit | Attending: Family Medicine | Admitting: Family Medicine

## 2018-09-18 ENCOUNTER — Ambulatory Visit (INDEPENDENT_AMBULATORY_CARE_PROVIDER_SITE_OTHER): Payer: Medicare Other | Admitting: Family Medicine

## 2018-09-18 ENCOUNTER — Encounter: Payer: Self-pay | Admitting: Family Medicine

## 2018-09-18 VITALS — BP 100/82 | HR 56 | Ht 70.0 in | Wt 181.0 lb

## 2018-09-18 DIAGNOSIS — M542 Cervicalgia: Secondary | ICD-10-CM | POA: Diagnosis not present

## 2018-09-18 DIAGNOSIS — M5030 Other cervical disc degeneration, unspecified cervical region: Secondary | ICD-10-CM | POA: Insufficient documentation

## 2018-09-18 DIAGNOSIS — G8929 Other chronic pain: Secondary | ICD-10-CM

## 2018-09-18 MED ORDER — GABAPENTIN 100 MG PO CAPS: ORAL_CAPSULE | ORAL | 3 refills | Status: DC

## 2018-09-18 NOTE — Assessment & Plan Note (Signed)
Significant degenerative disc disease.  Likely contributing to some of the discomfort and pain.  Patient does have significant muscle imbalances.  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  We discussed changing certain things.  We discussed posture and pulling back the scapulas.  Gabapentin given for nighttime relief.  We discussed tennis balls in a tube sock and laying him at the occipital region.  Past medical history is significant for squamous cell carcinoma status post radiation and does have significant scar tissue formation on the left side of the neck.  X-rays ordered today to make sure there is no recurrence.  Discussed icing regimen.  Follow-up again in 4 weeks

## 2018-09-18 NOTE — Patient Instructions (Signed)
Good to see you.  Ice 20 minutes 2 times daily. Usually after activity and before bed. pennsaid pinkie amount topically 2 times daily as needed.  Gabapentin 200 mg at night   See me again in 4 weeks

## 2018-09-20 ENCOUNTER — Other Ambulatory Visit: Payer: Self-pay | Admitting: Internal Medicine

## 2018-09-20 DIAGNOSIS — E039 Hypothyroidism, unspecified: Secondary | ICD-10-CM

## 2018-09-20 DIAGNOSIS — I712 Thoracic aortic aneurysm, without rupture, unspecified: Secondary | ICD-10-CM

## 2018-09-20 DIAGNOSIS — I10 Essential (primary) hypertension: Secondary | ICD-10-CM

## 2018-09-23 ENCOUNTER — Ambulatory Visit: Admitting: Neurology

## 2018-09-23 MED ORDER — Dilantin: 30 | 270 | Freq: Every day | 2 refills | 0 days | Status: AC

## 2018-09-24 ENCOUNTER — Ambulatory Visit: Admitting: Neurology

## 2018-09-24 NOTE — Progress Notes (Signed)
* * *      [name] [name], [name] [name] **DOB:** [birth year] [phone] yo M) **Acc No.** [id number] **DOS:**  09/24/2018    ---       **[name] [name], [name] [name]**    ------    38 Y old Male, DOB: 1951-01-22    [address] [place], [place] [address]    Home: [phone]    Provider: [name]        * * *    Telephone Encounter    ---    Answered by  [name] Date: 09/24/2018       Time: 07:57 AM    Reason  Repeat Dilantin level    ------            Action Taken                     [name]  09/24/2018 7:57:51 AM > says he had this yesterday- can you pls track down thx JO      [name],[name]  09/24/2018 10:01:17 AM > sent request to lab. thanks Dez                    * * *                ---          * * *         Provider: Liz Pinho 09/24/2018    ---    Note generated by eClinicalWorks EMR/PM Software ([email or web address])

## 2018-09-25 ENCOUNTER — Ambulatory Visit: Admitting: Neurology

## 2018-09-25 NOTE — Progress Notes (Signed)
* * *      Geigle, Kalan N **DOB:** 1950-08-02 [phone] yo M) **Acc No.** [id number] **DOS:**  09/25/2018    ---       **Trindade, [name]**    ------    54 Y old Male, DOB: Aug 16, 1950    [address], [place], [place] [address]    Home: [phone]    Provider: [name]        * * *    Telephone Encounter    ---    Answered by  [name] Date: 09/25/2018       Time: 08:18 AM    Reason  Can you pls call [facility] lab/obtain Dilantin level for  this week    ------            Action Taken                     Nickson Middlesworth  09/25/2018 10:21:00 AM > This was already drawn- just need the result can you pls just call them see if they can tell us - Dilantin/ Pht level thx JO      [name],[name]  09/25/2018 10:39:10 AM > called them and they are sending it over. but level was 2.4.      thanks      Rosland Riding  09/25/2018 10:41:35 AM > did they do phenobarbital by mistake am wondered- this happened previously- if so pls see if can add on phenytoin thx [name]  09/25/2018 10:47:52 AM > called Lab.  Patient had labs drawn on 6/15.  Phenobarb level was the only level processed.          Can add on Dilantin level onto the sample provided as long as there is an order.  the order documented as being sent on 6/16 was not received.  Please send the order again.  tx      [name],[name]  09/25/2018 12:17:30 PM > lab order resent.                    * * *                ---          * * *         Provider: [name], Sieanna Vanstone 09/25/2018    ---    Note generated by eClinicalWorks EMR/PM Software ([email or web address])

## 2018-09-26 ENCOUNTER — Ambulatory Visit: Admitting: Neurology

## 2018-09-26 NOTE — Progress Notes (Signed)
* * *      [name], [name] N **DOB:** 30-Oct-1950 [phone] yo M) **Acc No.** [id number] **DOS:**  09/26/2018    ---       **[name], [name] Brunt**    ------    59 Y old Male, DOB: 02/06/1951    [address], [place], [place] [address]    Home: [phone]    Provider: [name]        * * *    Telephone Encounter    ---    Answered by  [name] Date: 09/26/2018       Time: 11:00 AM    Reason  Phenobarbital level drawn (again) not phenytoin/dilantin    ------            Action Taken                     [name] [name]  09/26/2018 11:01:04 AM >       [name] [name]  09/26/2018 11:03:50 AM > From this week- phb done- may need to go to lab- I called them they cannot add on, although they could last time- please trouble shoot, and have him pls get another lab/dilantin level early next week, many thanks [name] [name]  09/27/2018 9:15:12 AM > called [facility].  Dilantin add on order was sent to lab on 6/17.  Dilantin level reported = 16.7.  Hard copy being faxed      [name] [name]  09/27/2018 11:47:34 AM > ok pls let patient know level = 16.4, should go again after 7/4 JO/ let him know by email JO                    * * *                ---          * * *         Provider: Helaine Yackel 09/26/2018    ---    Note generated by eClinicalWorks EMR/PM Software ([email or web address])

## 2018-10-08 ENCOUNTER — Ambulatory Visit: Admit: 2018-10-08 | Payer: Medicare PPO | Attending: Specialist | Admitting: Specialist

## 2018-10-08 ENCOUNTER — Ambulatory Visit (HOSPITAL_BASED_OUTPATIENT_CLINIC_OR_DEPARTMENT_OTHER)

## 2018-10-08 ENCOUNTER — Ambulatory Visit: Admitting: Specialist

## 2018-10-08 DIAGNOSIS — H35342 Macular cyst, hole, or pseudohole, left eye: Principal | ICD-10-CM

## 2018-10-17 ENCOUNTER — Ambulatory Visit: Admitting: Neurology

## 2018-10-17 ENCOUNTER — Ambulatory Visit

## 2018-10-17 NOTE — Progress Notes (Signed)
* * *      [name], [name] [name] **DOB:** [birth year] [phone] yo M) **Acc No.** [id number] **DOS:**  10/17/2018    ---       **[name], [name] [name]**    ------    58 Y old Male, DOB: March 17, 1951    [address] [place], [place] [address]    Home: [phone]    Provider: [name]        * * *    Telephone Encounter    ---    Answered by  [name] Date: 10/17/2018       Time: 09:45 AM    Action Taken                     [name]  10/17/2018 9:46:21 AM > recent dilantin level      [name]  10/17/2018 1:22:59 PM > 13.3/ will check in 1 more week patient will email JO         ------                * * *                ---          * * *         Provider: [name] 10/17/2018    ---    Note generated by eClinicalWorks EMR/PM Software ([email or web address])

## 2018-10-21 ENCOUNTER — Ambulatory Visit: Admitting: Neurology

## 2018-10-21 NOTE — Progress Notes (Signed)
* * *      [name], [name] BOECKMAN **DOB:** [birth year] [phone] yo M) **Acc No.** [id number] **DOS:**  10/21/2018    ---       **[name] [name], [name] [name]**    ------    33 Y old Male, DOB: [birth year]    [address] [place], [place] [address]    Home: [phone]    Provider: [name]        * * *    Telephone Encounter    ---    Answered by  Charyl Dancer Date: 10/21/2018       Time: 12:25 PM    Reason  dilantin level order    ------            Action Taken                     [facility]  10/21/2018 2:47:47 PM > faxed to [facility]                     * * *              * * *        ---      Reason for Appointment    ---     1\. Dilantin level order    ---     Assessments    ---    1\. Seizure disorder - G40.909    ---     Treatment    ---      **1\. Seizure disorder**    _LAB: Phenytoin (Dilantin)_   Draw level as needed. Please fax results to  [phone]    ------          * * *         Provider: [name] 10/21/2018    ---    Note generated by eClinicalWorks EMR/PM Software ([email or web address])

## 2018-10-28 ENCOUNTER — Other Ambulatory Visit: Payer: Self-pay | Admitting: Internal Medicine

## 2018-10-28 DIAGNOSIS — E538 Deficiency of other specified B group vitamins: Secondary | ICD-10-CM

## 2018-10-29 ENCOUNTER — Ambulatory Visit

## 2018-10-31 ENCOUNTER — Other Ambulatory Visit: Payer: Self-pay

## 2018-10-31 ENCOUNTER — Encounter: Payer: Self-pay | Admitting: Family Medicine

## 2018-10-31 ENCOUNTER — Ambulatory Visit (INDEPENDENT_AMBULATORY_CARE_PROVIDER_SITE_OTHER): Payer: Medicare Other | Admitting: Family Medicine

## 2018-10-31 DIAGNOSIS — M5030 Other cervical disc degeneration, unspecified cervical region: Secondary | ICD-10-CM

## 2018-10-31 MED ORDER — GABAPENTIN 300 MG PO CAPS: ORAL_CAPSULE | ORAL | 3 refills | Status: DC

## 2018-10-31 MED ORDER — TIZANIDINE HCL 4 MG PO TABS: 2.0000 mg | ORAL_TABLET | Freq: Three times a day (TID) | ORAL | 0 refills | Status: DC | PRN

## 2018-10-31 NOTE — Patient Instructions (Addendum)
Gabapentin 300mg  at night  Zanaflex up to 3 times a day  Exercises 3 times a week.  Keep hands within peripheral vision  See me again in 6-8 weeks

## 2018-10-31 NOTE — Progress Notes (Signed)
Brandon [name], Brandon [name] Subjective:     CC: Neck pain follow-up  [id number]    09/18/18: Significant degenerative disc disease.  Likely contributing to some of the discomfort and pain.  Patient does have significant muscle imbalances.  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  We discussed changing certain things.  We discussed posture and pulling back the scapulas.  Gabapentin given for nighttime relief.  We discussed tennis balls in a tube sock and laying him at the occipital region.  Past medical history is significant for squamous cell carcinoma status post radiation and does have significant scar tissue formation on the left side of the neck.  X-rays ordered today to make sure there is no recurrence.  Discussed icing regimen.  Follow-up again in 4 weeks  10/31/18 Brandon [name] is a 68 y.o. male coming in with complaint of neck pain.  He was last seen on 09/18/18 and notes some pain in his B shoulders.  He reports the pain in his neck tends to be slightly lower than it was previously.  He reports having done a glass installation x 3 over the past week.  He states that he has not been doing any exercises.  He reports that he is taking the Gabapentin.      Past Medical History:  Diagnosis Date  . Allergy    mild  . Aortic aneurysm (HCC)    small and watching  . GERD (gastroesophageal reflux disease)   . Head and neck cancer    head and neck ca dx 03/2009  . Hyperlipidemia   . Hypertension   . oropharyngeal ca dx'd 03/2009   chemo/xrt comp 06/2009  . Thyroid disease    Past Surgical History:  Procedure Laterality Date  . HERNIA REPAIR     inguinal  . hip replacemnt left Left   . Left eye    . stmach surgery     repair peg tube site   Social History   Socioeconomic History  . Marital status: Married    Spouse name: Not on file  . Number of children: 2  .  Years of education: 33  . Highest education level: Not on file  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 History  . 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 Financial resource strain: Not on file  . Food insecurity    Worry: Not on file    Inability: Not on file  . Transportation needs    Medical: Not on file    Non-medical: Not on file  Tobacco Use  . Smoking status: Former Smoker    Packs/day: 1.50    Years: 35.00    Pack years: 52.50    Types: Cigarettes, Cigars    Start date: 05/16/1973    Quit date: 04/19/2009    Years since quitting: 9.5  . Smokeless tobacco: Never Used  . Tobacco comment: quit smoking 5 years sago  Substance and Sexual Activity  . Alcohol use: Yes    Alcohol/week: 30.0 standard drinks    Types: 30 Shots of liquor per week  . Drug use: No  . Sexual activity: Yes    Partners: Female  Lifestyle  . Physical activity    Days per week: Not on file    Minutes per session: Not on file  . Stress: Not on file  Relationships  . Social connections  Talks on phone: Not on file    Gets together: Not on file    Attends religious service: Not on file    Active member of club or organization: Not on file    Attends meetings of clubs or organizations: Not on file    Relationship status: Not on file  Other Topics Concern  . Not on file  Social History Narrative   HSG, [employer]-no degree. married - '76 - 5 years, divorced; remarried  '84. 1 son - '85; 1 daughter '89. 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    Allergies  Allergen Reactions  . Benicar [Olmesartan]     fatigue  . Amlodipine Swelling   Family History  Problem Relation Age of Onset  . Cancer Mother        lymphoma  . Colon cancer Mother   . Cancer Father        lung (smoker)  . Hyperlipidemia Father   . Hypertension Father   . Diabetes Father   . Esophageal cancer Neg Hx   . Rectal cancer Neg Hx   . Stomach cancer Neg Hx     Current Outpatient Medications  (Endocrine & Metabolic):  .  SYNTHROID 100 MCG tablet, TAKE 1 TABLET DAILY  Current Outpatient Medications (Cardiovascular):  .  candesartan (ATACAND) 16 MG tablet, Take 1 tablet (16 mg total) by mouth daily. .  carvedilol (COREG) 6.25 MG tablet, TAKE 1 TABLET TWICE DAILY  WITH MEALS .  rosuvastatin (CRESTOR) 20 MG tablet, Take 1 tablet (20 mg total) by mouth daily.   Current Outpatient Medications (Analgesics):  .  aspirin 81 MG tablet, Take 81 mg by mouth daily.   Current Outpatient Medications (Other):  Marland Kitchen  Magnesium Oxide 250 MG TABS, TAKE 1 TABLET BY MOUTH TWICE A DAY .  omeprazole (PRILOSEC) 40 MG capsule, Take 1 capsule (40 mg total) by mouth daily. .  Thiamine HCl (VITAMIN B1) 100 MG TABS, TAKE 1/2 TABLET DAILY .  zolpidem (AMBIEN) 10 MG tablet, TAKE 1 TABLET AT BEDTIME ASNEEDED .  gabapentin (NEURONTIN) 300 MG capsule, nightly .  tiZANidine (ZANAFLEX) 4 MG tablet, Take 0.5-1 tablets (2-4 mg total) by mouth every 8 (eight) hours as needed for muscle spasms.    Past medical history, social, surgical and family history all reviewed in electronic medical record.  No pertanent information unless stated regarding to the chief complaint.   Review of Systems:  No headache, visual changes, nausea, vomiting, diarrhea, constipation, dizziness, abdominal pain, skin rash, fevers, chills, night sweats, weight loss, swollen lymph nodes, body aches, joint swelling, , chest pain, shortness of breath, mood changes.  Positive muscle aches  Objective  Blood pressure 110/72, pulse 67, height 5\' 10"  (1.778 m), weight 182 lb (82.6 kg), SpO2 98 %.    General: No apparent distress alert and oriented x3 mood and affect normal, dressed appropriately.  HEENT: Pupils equal, extraocular movements intact  Respiratory: Patient's speak in full sentences and does not appear short of breath  Cardiovascular: No lower extremity edema, non tender, no erythema  Skin: Warm dry intact with no signs of infection  or rash on extremities or on axial skeleton.  Abdomen: Soft nontender  Neuro: Cranial nerves II through XII are intact, neurovascularly intact in all extremities with 2+ DTRs and 2+ pulses.  Lymph: No lymphadenopathy of posterior or anterior cervical chain or axillae bilaterally.  Gait normal with good balance and coordination.  MSK:  tender with full range of motion and good  stability and symmetric strength and tone of shoulders, elbows, wrist, hip, knee and ankles bilaterally.  Mild arthritic changes of multiple joints  Patient's neck exam shows the patient does have anterior movement of the neck but has significant decreased extension of the neck.  Severely tender to palpation in the paraspinal musculature.  Patient does have some tightness noted.  Negative Spurling's today.  Multiple trigger points in the right shoulder region.  Patient does have 4+ out of 5 strength of the shoulder girdle bilaterally.    Impression and Recommendations:      The above documentation has been reviewed and is accurate and complete Lyndal Pulley, DO       Note: This dictation was prepared with Dragon dictation along with smaller phrase technology. Any transcriptional errors that result from this process are unintentional.

## 2018-10-31 NOTE — Assessment & Plan Note (Signed)
Severe on imaging.  Patient having difficulty.  Increase gabapentin 300 mg, and Zanaflex for breakthrough pain during the day but warned of potential side effects.  Discussed with patient about the possibility of trigger points and consider that is future considerations.  Patient will follow-up with me again in 4 to 8 weeks

## 2018-11-01 ENCOUNTER — Ambulatory Visit: Admit: 2018-11-01 | Payer: Medicare PPO

## 2018-11-01 ENCOUNTER — Ambulatory Visit: Admitting: Ophthalmology

## 2018-11-01 ENCOUNTER — Ambulatory Visit (HOSPITAL_BASED_OUTPATIENT_CLINIC_OR_DEPARTMENT_OTHER)

## 2018-11-01 DIAGNOSIS — H02834 Dermatochalasis of left upper eyelid: Secondary | ICD-10-CM

## 2018-11-01 DIAGNOSIS — H2511 Age-related nuclear cataract, right eye: Secondary | ICD-10-CM

## 2018-11-01 DIAGNOSIS — H02831 Dermatochalasis of right upper eyelid: Secondary | ICD-10-CM

## 2018-11-01 DIAGNOSIS — H01002 Unspecified blepharitis right lower eyelid: Secondary | ICD-10-CM

## 2018-11-01 DIAGNOSIS — H01005 Unspecified blepharitis left lower eyelid: Secondary | ICD-10-CM

## 2018-11-01 DIAGNOSIS — H01004 Unspecified blepharitis left upper eyelid: Secondary | ICD-10-CM

## 2018-11-01 DIAGNOSIS — H52213 Irregular astigmatism, bilateral: Secondary | ICD-10-CM

## 2018-11-01 DIAGNOSIS — H01001 Unspecified blepharitis right upper eyelid: Secondary | ICD-10-CM

## 2018-11-01 DIAGNOSIS — H2512 Age-related nuclear cataract, left eye: Principal | ICD-10-CM

## 2018-11-06 ENCOUNTER — Ambulatory Visit

## 2018-11-07 ENCOUNTER — Other Ambulatory Visit: Payer: Self-pay | Admitting: Internal Medicine

## 2018-11-07 DIAGNOSIS — I712 Thoracic aortic aneurysm, without rupture, unspecified: Secondary | ICD-10-CM

## 2018-11-07 DIAGNOSIS — E785 Hyperlipidemia, unspecified: Secondary | ICD-10-CM

## 2018-11-19 ENCOUNTER — Ambulatory Visit: Admitting: Neurology

## 2018-11-19 NOTE — Progress Notes (Signed)
* * *      [name] [name], [name] [name] **DOB:** 01-14-1951 [phone] yo M) **Acc No.** [id number] **DOS:**  11/19/2018    ---       **[name] [name], [name] [name]**    ------    6 Y old Male, DOB: 05-03-1950    [address] [place], [place] [address]    Home: [phone]    Provider: [name]        * * *    Telephone Encounter    ---    Answered by  [name] Date: 11/19/2018       Time: 08:22 AM    Reason  Please obtain results/dilantin from last week patient emailing wants  to know    ------            Action Taken                     [name] [name]  11/19/2018 8:23:00 AM > pls find out what lab results are from last week- had this drawn thx JO      [name] [name],[name] [name]  11/19/2018 9:00:44 AM > lab results from 11/15/2018 are in patient docs is that what you are looking for?      thanks Dez      [name] [name]  11/19/2018 12:59:24 PM > Dilantin 11.6 JO      [name] [name]  11/19/2018 1:05:42 PM > can you pls offer a TL visit- do we have any coming up?      [name] [name],[name] [name]  11/19/2018 3:35:32 PM > Pt called back in regards to his telehealth app, please contact pt thank you      [name] [name],[name] [name]  11/21/2018 11:24:27 AM > appt booked. thanks Dez                    * * *                ---          * * *         Provider: Kendall Arnell 11/19/2018    ---    Note generated by eClinicalWorks EMR/PM Software ([email or web address])

## 2018-11-22 ENCOUNTER — Other Ambulatory Visit: Payer: Self-pay | Admitting: Family Medicine

## 2018-11-25 ENCOUNTER — Ambulatory Visit: Admit: 2018-11-25 | Payer: Medicare PPO

## 2018-11-25 ENCOUNTER — Ambulatory Visit: Admitting: Neurology

## 2018-11-25 DIAGNOSIS — G40909 Epilepsy, unspecified, not intractable, without status epilepticus: Principal | ICD-10-CM

## 2018-11-25 NOTE — Progress Notes (Signed)
.    Progress Notes  .  Patient: [name] [name], [name] [name]  Provider: [name]    .  DOB: [birth year] Age: 68 Y Sex: Male  .  PCP: Alessandra Grout MD  Date: 11/25/2018  .  --------------------------------------------------------------------------------  .  REASON FOR APPOINTMENT  .  1. PHONE CALL  .  HISTORY OF PRESENT ILLNESS  .  GENERAL:  called X 4 LmTL Visit due to Covid 23  [employer] pandemic/ [name] at office, patient remote at  home, audio only. Dear Referring mds- many thanks for sending [name]  [name] to [facility]- he is a 68 year old with idiopathic  seizures/see notes- the patient has had unfortuantely labile  dilantin levels- and is getting levels q 2-3 weeks or so- he  would like to try to continue the course, although there are  other AEDs, the patient would like to continue, taking  seizure/safety precautions, all questions answered 15 minutes no  exam.  .  CURRENT MEDICATIONS  .  Taking Aspirin 81 MG Tablet Chewable 1 tablet Orally Once a day  Taking Atorvastatin Calcium 10 MG Tablet 1 tablet Orally Once a  day  Taking CoQ10  Taking Dilantin 100 mg Capsule TAKE 5 CAPSULES BY MOUTH EVERY  MORNING WITH THE 30MG  Orally- NAME BRAND DAW MEDICALLY NECESSARY  Taking Dilantin 30 mg Capsule as directed Orally sig up to 3  tablets daily may undergo titration  Taking Omeprazole 20 MG Capsule Delayed Release 1 capsule Orally  Once a day  Taking Potassium Chloride 20 MEQ Packet 1 packet with food Orally  Once a day  Medication List reviewed and reconciled with the patient  .  PAST MEDICAL HISTORY  .  See notes- seizures/cardiovascular disease  Seizures  .  ALLERGIES  .  N.K.D.A.  .  SURGICAL HISTORY  .  No Surgical History documented.  Marland Kitchen  FAMILY HISTORY  .  No FH seizures.  .  SOCIAL HISTORY  .  .  Tobacco  history:Never smoked  .  no alcohol or illicits.  .  HOSPITALIZATION/MAJOR DIAGNOSTIC PROCEDURE  .  No Hospitalization History.  Marland Kitchen  PHYSICAL EXAMINATION  .  NO EXAM.  .  ASSESSMENTS  .  Seizure disorder -  G40.909 (Primary)  .  68 year old with the above- PLAN : Seiuzre/safety precautions,  continue with dilantin for now but reviewed other options, levels  have been erratic may be impractical to continue with Pht/ all  questions answered, agrees to follow with pht X 3 months then  consider options, [name] Bryant MD.  .  PROCEDURE CODES  .  [id number] PHONE E/M BY PHYS 11-20 MIN  .  FOLLOW UP  .  6 Months  .  Electronically signed by [name] , MD on  11/25/2018 at 09:59 PM EDT  .  Document electronically signed by [name]    .

## 2018-11-25 NOTE — Progress Notes (Signed)
 * * *    [name] [name], [name] [name] **DOB:** [birth year] (68 yo M) **Acc No.** [id number] **DOS:**  11/25/2018    ---       **[name], [name] [name]**    ------    70 Y old Male, DOB: 09/26/1950, External MRN: [id number]    Account Number: [id number]    [address], [place], [place]    Home: [phone]    Insurance: N78 [employer] GR[employer] ADV PPO    PCP: Alessandra Grout, [name] Referring: Alessandra Grout, [name]    Appointment Facility: Neurology        * * *    11/25/2018 Progress Notes: [name] **CHN#:** [id number]    ------    ---       **Reason for Appointment**    ---      1\. PHONE CALL    ---      **History of Present Illness**    ---     _GENERAL_ :    called X 4 Lm    TL Visit due to Covid 47 [employer] pandemic/ [name] at office, patient  remote at home, audio only. Dear Referring mds- many thanks for sending MR  Scheuermann to [facility]- he is a 68 year old with idiopathic seizures/see  notes- the patient has had unfortuantely labile dilantin levels- and is  getting levels q 2-3 weeks or so- he would like to try to continue the course,  although there are other AEDs, the patient would like to continue, taking  seizure/safety precautions, all questions answered 15 minutes no exam.      **Current Medications**    ---    Taking    * Aspirin 81 MG Tablet Chewable 1 tablet Orally Once a day    ---    * Atorvastatin Calcium 10 MG Tablet 1 tablet Orally Once a day    ---    * CoQ10     ---    * Dilantin 100 mg Capsule TAKE 5 CAPSULES BY MOUTH EVERY MORNING WITH THE 30MG  Orally- NAME BRAND DAW MEDICALLY NECESSARY     ---    * Dilantin 30 mg Capsule as directed Orally sig up to 3 tablets daily may undergo titration    ---    * Omeprazole 20 MG Capsule Delayed Release 1 capsule Orally Once a day    ---    * Potassium Chloride 20 MEQ Packet 1 packet with food Orally Once a day    ---    * Medication List reviewed and reconciled with the patient    ---      **Past Medical History**    ---      See notes-  seizures/cardiovascular disease.        ---    Seizures.        ---      **Surgical History**    ---      No Surgical History documented.    ---      **Family History**    ---      No FH seizures.    ---      **Social History**    ---    Tobacco    history: _Never smoked_  no alcohol or illicits.    ---      **Allergies**    ---      [name].K.D.A.    ---      **Hospitalization/Major Diagnostic Procedure**    ---  No Hospitalization History.    ---      **Physical Examination**    ---    NO EXAM.      **Assessments**    ---    1\. Seizure disorder - G40.909 (Primary)    ---     68 year old with the above- PLAN : Seiuzre/safety precautions, continue with  dilantin for now but reviewed other options, levels have been erratic may be  impractical to continue with Pht/ all questions answered, agrees to follow  with pht X 3 months then consider options, [name] [name].    ---      **Procedure Codes**    ---      [phone] PHONE E/M BY PHYS 11-20 MIN    ---      **Follow Up**    ---    6 Months    Electronically signed by [name] , [name] on 11/25/2018 at 09:59 PM EDT    Sign off status: Completed        * * *        Neurology    [address]    [place], [address]    [place], [place] [address]    Tel: [phone]    Fax: [phone]              * * *          Progress Note: [name] 11/25/2018    ---    Note generated by eClinicalWorks EMR/PM Software ([email or web address])

## 2018-11-26 ENCOUNTER — Other Ambulatory Visit: Payer: Self-pay | Admitting: Family Medicine

## 2018-12-10 MED ORDER — ketorolac 0.5 % eye drops: Freq: Four times a day (QID) | 3 refills | 30 days | Status: AC

## 2018-12-10 MED ORDER — prednisolone acetate 1 % eye drops,suspension: Freq: Four times a day (QID) | 3 refills | 30 days | Status: AC

## 2018-12-10 MED ORDER — ofloxacin 0.3 % eye drops: Freq: Four times a day (QID) | 3 refills | 30 days | Status: AC

## 2018-12-11 ENCOUNTER — Ambulatory Visit

## 2018-12-11 ENCOUNTER — Ambulatory Visit: Admitting: Neurology

## 2018-12-11 NOTE — Progress Notes (Signed)
* * *      [name] [name], [name] [name] **DOB:** 1950-09-20 [phone] yo M) **Acc No.** [id number] **DOS:**  12/11/2018    ---       **[name] [name], [name] [name]**    ------    62 Y old Male, DOB: February 18, 1951    [address] [place], [place] [address]    Home: [phone]    Provider: [name]        * * *    Telephone Encounter    ---    Answered by  [name] Date: 12/11/2018       Time: 11:20 AM    Action Taken                     [name] [name]  12/11/2018 11:21:00 AM > he is sending me email he had his dilantin level this week wants results many thx /can you pls let me know many thx JO      [name] [name],[name] [name]  12/11/2018 11:29:47 AM > in pt docs. thanks Dez      [name] [name]  12/11/2018 1:24:08 PM > ok thx [name] [name] = dilantin level JO         ------                * * *                ---          * * *         Provider: [name], [name] [name] 12/11/2018    ---    Note generated by eClinicalWorks EMR/PM Software ([email or web address])

## 2018-12-20 ENCOUNTER — Ambulatory Visit: Admit: 2018-12-20 | Payer: Medicare PPO

## 2018-12-20 ENCOUNTER — Ambulatory Visit: Admitting: Ophthalmology

## 2018-12-20 ENCOUNTER — Ambulatory Visit (HOSPITAL_BASED_OUTPATIENT_CLINIC_OR_DEPARTMENT_OTHER)

## 2018-12-20 DIAGNOSIS — Z9842 Cataract extraction status, left eye: Principal | ICD-10-CM

## 2018-12-23 ENCOUNTER — Encounter: Payer: Self-pay | Admitting: Internal Medicine

## 2018-12-23 ENCOUNTER — Other Ambulatory Visit: Payer: Self-pay | Admitting: Family

## 2018-12-23 ENCOUNTER — Other Ambulatory Visit: Payer: Self-pay | Admitting: Internal Medicine

## 2018-12-23 DIAGNOSIS — I712 Thoracic aortic aneurysm, without rupture, unspecified: Secondary | ICD-10-CM

## 2018-12-23 DIAGNOSIS — I10 Essential (primary) hypertension: Secondary | ICD-10-CM

## 2018-12-23 DIAGNOSIS — E039 Hypothyroidism, unspecified: Secondary | ICD-10-CM

## 2018-12-23 DIAGNOSIS — K219 Gastro-esophageal reflux disease without esophagitis: Secondary | ICD-10-CM

## 2018-12-23 DIAGNOSIS — F5101 Primary insomnia: Secondary | ICD-10-CM

## 2018-12-24 ENCOUNTER — Other Ambulatory Visit: Payer: Self-pay | Admitting: Internal Medicine

## 2018-12-24 ENCOUNTER — Encounter: Payer: Self-pay | Admitting: Internal Medicine

## 2018-12-24 DIAGNOSIS — I10 Essential (primary) hypertension: Secondary | ICD-10-CM

## 2018-12-24 DIAGNOSIS — K219 Gastro-esophageal reflux disease without esophagitis: Secondary | ICD-10-CM

## 2018-12-24 DIAGNOSIS — F5101 Primary insomnia: Secondary | ICD-10-CM

## 2018-12-24 MED ORDER — ZOLPIDEM TARTRATE 10 MG PO TABS: ORAL_TABLET | ORAL | 0 refills | Status: DC

## 2018-12-24 MED ORDER — OMEPRAZOLE 40 MG PO CPDR: 40.0000 mg | DELAYED_RELEASE_CAPSULE | Freq: Every day | ORAL | 0 refills | Status: DC

## 2018-12-24 MED ORDER — CANDESARTAN CILEXETIL 16 MG PO TABS: 16.0000 mg | ORAL_TABLET | Freq: Every day | ORAL | 0 refills | Status: DC

## 2018-12-24 NOTE — Telephone Encounter (Signed)
Pt has scheduled for 01/06/2019. Spoke to [employer] (pt spouse). Can enough be sent in for the candesartan, omeprazole and zolpidem?

## 2018-12-25 ENCOUNTER — Ambulatory Visit: Admit: 2018-12-25 | Payer: Medicare PPO

## 2018-12-25 ENCOUNTER — Ambulatory Visit: Admitting: Ophthalmology

## 2018-12-25 ENCOUNTER — Ambulatory Visit (HOSPITAL_BASED_OUTPATIENT_CLINIC_OR_DEPARTMENT_OTHER)

## 2018-12-25 ENCOUNTER — Encounter: Payer: Self-pay | Admitting: Family Medicine

## 2018-12-25 ENCOUNTER — Other Ambulatory Visit: Payer: Self-pay | Admitting: Family Medicine

## 2018-12-25 ENCOUNTER — Other Ambulatory Visit: Payer: Self-pay

## 2018-12-25 ENCOUNTER — Ambulatory Visit (INDEPENDENT_AMBULATORY_CARE_PROVIDER_SITE_OTHER): Payer: Medicare Other | Admitting: Family Medicine

## 2018-12-25 DIAGNOSIS — M5030 Other cervical disc degeneration, unspecified cervical region: Secondary | ICD-10-CM

## 2018-12-25 DIAGNOSIS — Z9842 Cataract extraction status, left eye: Principal | ICD-10-CM

## 2018-12-25 NOTE — Assessment & Plan Note (Signed)
Discussed with patient in great length, discussed home exercises, icing regimen, with activities of doing which wants to avoid.  Patient is to increase activity slowly over the course the next several weeks.  Patient will continue on gabapentin movement when to go up anymore.  And ibuprofen for breakthrough as well as muscle relaxers which he has not used.  Patient will follow-up again in 3 to 4 months

## 2018-12-25 NOTE — Patient Instructions (Signed)
Happy you're doing well See you in 3 months

## 2018-12-25 NOTE — Progress Notes (Signed)
[name], [place] [address] Phone: [phone] Subjective:   I [name] am serving as a Education administrator for Dr. Hulan Saas.  I'm seeing this patient by the request  of:    CC: Neck pain follow-up  [id number]  Brandon [name] is a 68 y.o. male coming in with complaint of neck pain. States that he is doing well today.  Neck pain.  Patient has severe degenerative disc disease of the cervical spine.  Patient was to increase the gabapentin to 300 mg.  Doing significantly better at this time.  Minimal discomfort.  States new activities cause some problems.  Patient states that he may need some ice presented.  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    Past Medical History:  Diagnosis Date  . Allergy    mild  . Aortic aneurysm (HCC)    small and watching  . GERD (gastroesophageal reflux disease)   . Head and neck cancer    head and neck ca dx 03/2009  . Hyperlipidemia   . Hypertension   . oropharyngeal ca dx'd 03/2009   chemo/xrt comp 06/2009  . Thyroid disease    Past Surgical History:  Procedure Laterality Date  . HERNIA REPAIR     inguinal  . hip replacemnt left Left   . Left eye    . stmach surgery     repair peg tube site   Social History   Socioeconomic History  . Marital status: Married    Spouse name: Not on file  . Number of children: 2  . Years of education: 39  . Highest education level: Not on file  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 History  . 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 Financial resource strain: Not on file  . Food insecurity    Worry: Not on file    Inability: Not on file  . Transportation needs    Medical: Not on file    Non-medical: Not on file  Tobacco Use  . Smoking status: Former Smoker    Packs/day: 1.50    Years: 35.00    Pack years: 52.50    Types: Cigarettes, Cigars    Start date: 05/16/1973    Quit date:  04/19/2009    Years since quitting: 9.6  . Smokeless tobacco: Never Used  . Tobacco comment: quit smoking 5 years sago  Substance and Sexual Activity  . Alcohol use: Yes    Alcohol/week: 30.0 standard drinks    Types: 30 Shots of liquor per week  . Drug use: No  . Sexual activity: Yes    Partners: Female  Lifestyle  . Physical activity    Days per week: Not on file    Minutes per session: Not on file  . Stress: Not on file  Relationships  . Social Herbalist on phone: Not on file    Gets together: Not on file    Attends religious service: Not on file    Active member of club or organization: Not on file    Attends meetings of clubs or organizations: Not on file    Relationship status: Not on file  Other Topics Concern  . Not on file  Social History Narrative   [employer], [employer]-no degree. married - '76 - 5 years, divorced; remarried  '84. 1 son - '85; 1 daughter '89.  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    Allergies  Allergen Reactions  . Benicar [Olmesartan]     fatigue  . Amlodipine Swelling   Family History  Problem Relation Age of Onset  . Cancer Mother        lymphoma  . Colon cancer Mother   . Cancer Father        lung (smoker)  . Hyperlipidemia Father   . Hypertension Father   . Diabetes Father   . Esophageal cancer Neg Hx   . Rectal cancer Neg Hx   . Stomach cancer Neg Hx     Current Outpatient Medications (Endocrine & Metabolic):  .  SYNTHROID 100 MCG tablet, TAKE 1 TABLET DAILY  Current Outpatient Medications (Cardiovascular):  .  candesartan (ATACAND) 16 MG tablet, Take 1 tablet (16 mg total) by mouth daily. .  carvedilol (COREG) 6.25 MG tablet, TAKE 1 TABLET TWICE DAILY  WITH MEALS .  rosuvastatin (CRESTOR) 20 MG tablet, TAKE 1 TABLET BY MOUTH EVERY DAY   Current Outpatient Medications (Analgesics):  .  aspirin 81 MG tablet, Take 81 mg by mouth daily. .  DUEXIS 800-26.6 MG TABS, TAKE ONE TABLET BY MOUTH THREE TIMES  DAILY   Current Outpatient Medications (Other):  .  gabapentin (NEURONTIN) 300 MG capsule, nightly .  Magnesium Oxide 250 MG TABS, TAKE 1 TABLET BY MOUTH TWICE A DAY .  omeprazole (PRILOSEC) 40 MG capsule, Take 1 capsule (40 mg total) by mouth daily. .  Thiamine HCl (VITAMIN B1) 100 MG TABS, TAKE 1/2 TABLET DAILY .  tiZANidine (ZANAFLEX) 4 MG tablet, TAKE 0.5-1 TABLETS (2-4 MG TOTAL) BY MOUTH EVERY 8 (EIGHT) HOURS AS NEEDED FOR MUSCLE SPASMS. Marland Kitchen  zolpidem (AMBIEN) 10 MG tablet, TAKE 1 TABLET AT BEDTIME ASNEEDED    Past medical history, social, surgical and family history all reviewed in electronic medical record.  No pertanent information unless stated regarding to the chief complaint.   Review of Systems:  No headache, visual changes, nausea, vomiting, diarrhea, constipation, dizziness, abdominal pain, skin rash, fevers, chills, night sweats, weight loss, swollen lymph nodes, body aches, joint swelling, muscle aches, chest pain, shortness of breath, mood changes.   Objective  Blood pressure 110/76, pulse (!) 56, height 5\' 10"  (1.778 m), weight 183 lb (83 kg), SpO2 96 %.   General: No apparent distress alert and oriented x3 mood and affect normal, dressed appropriately.  HEENT: Pupils equal, extraocular movements intact  Respiratory: Patient's speak in full sentences and does not appear short of breath  Cardiovascular: No lower extremity edema, non tender, no erythema  Skin: Warm dry intact with no signs of infection or rash on extremities or on axial skeleton.  Abdomen: Soft nontender  Neuro: Cranial nerves II through XII are intact, neurovascularly intact in all extremities with 2+ DTRs and 2+ pulses.  Lymph: No lymphadenopathy of posterior or anterior cervical chain or axillae bilaterally.  Gait normal with good balance and coordination.  MSK:  tender with full range of motion and good stability and symmetric strength and tone of shoulders, elbows, wrist, hip, knee and ankles  bilaterally.  Neck exam does have some anterior displacement noted.  Lacks only 10 degrees of extension.  Crepitus noted in all range of motion.  Negative Spurling's today.  Full grip strength.  Full range of motion of the shoulders noted today.  Mild tightness of the trapezius.    Impression and Recommendations:      The above documentation has been reviewed  and is accurate and complete Lyndal Pulley, DO       Note: This dictation was prepared with Dragon dictation along with smaller phrase technology. Any transcriptional errors that result from this process are unintentional.

## 2018-12-31 ENCOUNTER — Ambulatory Visit

## 2019-01-06 ENCOUNTER — Other Ambulatory Visit: Payer: Self-pay

## 2019-01-06 ENCOUNTER — Ambulatory Visit (INDEPENDENT_AMBULATORY_CARE_PROVIDER_SITE_OTHER): Payer: Medicare Other | Admitting: Internal Medicine

## 2019-01-06 ENCOUNTER — Encounter: Payer: Self-pay | Admitting: Internal Medicine

## 2019-01-06 ENCOUNTER — Other Ambulatory Visit (INDEPENDENT_AMBULATORY_CARE_PROVIDER_SITE_OTHER): Payer: Medicare Other

## 2019-01-06 VITALS — BP 166/100 | HR 58 | Temp 98.3°F | Resp 16 | Ht 70.0 in | Wt 184.2 lb

## 2019-01-06 DIAGNOSIS — E519 Thiamine deficiency, unspecified: Secondary | ICD-10-CM

## 2019-01-06 DIAGNOSIS — Z77011 Contact with and (suspected) exposure to lead: Secondary | ICD-10-CM

## 2019-01-06 DIAGNOSIS — I10 Essential (primary) hypertension: Secondary | ICD-10-CM | POA: Diagnosis not present

## 2019-01-06 DIAGNOSIS — E039 Hypothyroidism, unspecified: Secondary | ICD-10-CM

## 2019-01-06 LAB — HEPATIC FUNCTION PANEL
ALT: 28 U/L (ref 0–53)
AST: 37 U/L (ref 0–37)
Albumin: 4.2 g/dL (ref 3.5–5.2)
Alkaline Phosphatase: 68 U/L (ref 39–117)
Bilirubin, Direct: 0.1 mg/dL (ref 0.0–0.3)
Total Bilirubin: 0.6 mg/dL (ref 0.2–1.2)
Total Protein: 7.3 g/dL (ref 6.0–8.3)

## 2019-01-06 LAB — CBC WITH DIFFERENTIAL/PLATELET
Basophils Absolute: 0.1 10*3/uL (ref 0.0–0.1)
Basophils Relative: 1.3 % (ref 0.0–3.0)
Eosinophils Absolute: 0.2 10*3/uL (ref 0.0–0.7)
Eosinophils Relative: 3.3 % (ref 0.0–5.0)
HCT: 43.3 % (ref 39.0–52.0)
Hemoglobin: 14.9 g/dL (ref 13.0–17.0)
Lymphocytes Relative: 19.9 % (ref 12.0–46.0)
Lymphs Abs: 1 10*3/uL (ref 0.7–4.0)
MCHC: 34.4 g/dL (ref 30.0–36.0)
MCV: 93.8 fl (ref 78.0–100.0)
Monocytes Absolute: 0.6 10*3/uL (ref 0.1–1.0)
Monocytes Relative: 11.8 % (ref 3.0–12.0)
Neutro Abs: 3.2 10*3/uL (ref 1.4–7.7)
Neutrophils Relative %: 63.7 % (ref 43.0–77.0)
Platelets: 311 10*3/uL (ref 150.0–400.0)
RBC: 4.61 Mil/uL (ref 4.22–5.81)
RDW: 14.2 % (ref 11.5–15.5)
WBC: 5 10*3/uL (ref 4.0–10.5)

## 2019-01-06 LAB — BASIC METABOLIC PANEL
BUN: 24 mg/dL — ABNORMAL HIGH (ref 6–23)
CO2: 29 mEq/L (ref 19–32)
Calcium: 9.7 mg/dL (ref 8.4–10.5)
Chloride: 102 mEq/L (ref 96–112)
Creatinine, Ser: 1.05 mg/dL (ref 0.40–1.50)
GFR: 70.51 mL/min (ref >60.00)
Glucose, Bld: 99 mg/dL (ref 70–99)
Potassium: 4.2 mEq/L (ref 3.5–5.1)
Sodium: 140 mEq/L (ref 135–145)

## 2019-01-06 LAB — VITAMIN D 25 HYDROXY (VIT D DEFICIENCY, FRACTURES): VITD: 50.77 ng/mL (ref 30.00–100.00)

## 2019-01-06 LAB — MAGNESIUM: Magnesium: 1.5 mg/dL (ref 1.5–2.5)

## 2019-01-06 LAB — TSH: TSH: 3.05 u[IU]/mL (ref 0.35–4.50)

## 2019-01-06 NOTE — Progress Notes (Signed)
Subjective:  Patient ID: Brandon [name], male    DOB: 07-20-1950  Age: 68 y.o. MRN: [id number]  CC: Hypertension   HPI Brandon [name] presents for a BP check - He has not been monitoring his blood pressure but he tells me he has been compliant with the candesartan and carvedilol.  Once I last saw him he is started taking high-dose ibuprofen.  He is not very active and has not been working on his lifestyle modifications.  When he is active he denies any recent episodes of CP or DOE.  Outpatient Medications Prior to Visit  Medication Sig Dispense Refill   candesartan (ATACAND) 16 MG tablet Take 1 tablet (16 mg total) by mouth daily. 90 tablet 0   carvedilol (COREG) 6.25 MG tablet TAKE 1 TABLET TWICE DAILY  WITH MEALS 180 tablet 0   DUEXIS 800-26.6 MG TABS TAKE ONE TABLET BY MOUTH THREE TIMES DAILY 90 tablet 2   gabapentin (NEURONTIN) 300 MG capsule nightly 90 capsule 3   Magnesium Oxide 250 MG TABS TAKE 1 TABLET BY MOUTH TWICE A DAY 180 tablet 1   omeprazole (PRILOSEC) 40 MG capsule Take 1 capsule (40 mg total) by mouth daily. 90 capsule 0   rosuvastatin (CRESTOR) 20 MG tablet TAKE 1 TABLET BY MOUTH EVERY DAY 90 tablet 0   SYNTHROID 100 MCG tablet TAKE 1 TABLET DAILY 90 tablet 0   Thiamine HCl (VITAMIN B1) 100 MG TABS TAKE 1/2 TABLET DAILY 45 tablet 1   tiZANidine (ZANAFLEX) 4 MG tablet TAKE 0.5-1 TABLETS (2-4 MG TOTAL) BY MOUTH EVERY 8 (EIGHT) HOURS AS NEEDED FOR MUSCLE SPASMS. 90 tablet 0   zolpidem (AMBIEN) 10 MG tablet TAKE 1 TABLET AT BEDTIME ASNEEDED 90 tablet 0   aspirin 81 MG tablet Take 81 mg by mouth daily.     No facility-administered medications prior to visit.     ROS Review of Systems  Constitutional: Negative for chills, diaphoresis, fatigue and unexpected weight change.  HENT: Negative.   Eyes: Negative for visual disturbance.  Respiratory: Negative for cough, chest tightness, shortness of breath and wheezing.   Cardiovascular: Negative for chest pain,  palpitations and leg swelling.  Gastrointestinal: Negative for abdominal pain, constipation, diarrhea, nausea and vomiting.  Endocrine: Negative.   Genitourinary: Negative.  Negative for difficulty urinating, dysuria, hematuria and urgency.  Musculoskeletal: Negative.  Negative for arthralgias and myalgias.  Skin: Negative for color change and pallor.  Neurological: Negative.  Negative for dizziness, weakness, light-headedness and headaches.  Hematological: Negative for adenopathy. Does not bruise/bleed easily.  Psychiatric/Behavioral: Negative.     Objective:  BP (!) 166/100 (BP Location: Left Arm, Patient Position: Sitting, Cuff Size: Normal)    Pulse (!) 58    Temp 98.3 F (36.8 C) (Oral)    Resp 16    Ht 5\' 10"  (1.778 m)    Wt 184 lb 4 oz (83.6 kg)    SpO2 98%    BMI 26.44 kg/m   BP Readings from Last 3 Encounters:  01/06/19 (!) 166/100  12/25/18 110/76  10/31/18 110/72    Wt Readings from Last 3 Encounters:  01/06/19 184 lb 4 oz (83.6 kg)  12/25/18 183 lb (83 kg)  10/31/18 182 lb (82.6 kg)    Physical Exam Vitals signs reviewed.  Constitutional:      Appearance: Normal appearance.  HENT:     Nose: Nose normal.     Mouth/Throat:     Mouth: Mucous membranes are moist.  Eyes:  General: No scleral icterus.    Conjunctiva/sclera: Conjunctivae normal.  Neck:     Musculoskeletal: Normal range of motion and neck supple. No muscular tenderness.  Cardiovascular:     Rate and Rhythm: Normal rate and regular rhythm.     Heart sounds: No murmur. No gallop.      Comments: EKG ---  Sinus  Bradycardia  -First degree A-V block  PRi = 220 Poor SNR (< 1) in leads  AVF  BORDERLINE RHYTHM  Pulmonary:     Effort: Pulmonary effort is normal.     Breath sounds: No stridor. No wheezing, rhonchi or rales.  Abdominal:     General: Abdomen is flat. Bowel sounds are normal. There is no distension.     Palpations: Abdomen is soft. There is no hepatomegaly or splenomegaly.      Tenderness: There is no abdominal tenderness.  Musculoskeletal: Normal range of motion.     Right lower leg: No edema.     Left lower leg: No edema.  Lymphadenopathy:     Cervical: No cervical adenopathy.  Skin:    General: Skin is warm and dry.     Coloration: Skin is not pale.  Neurological:     General: No focal deficit present.     Mental Status: He is alert and oriented to person, place, and time.     Lab Results  Component Value Date   WBC 5.0 01/06/2019   HGB 14.9 01/06/2019   HCT 43.3 01/06/2019   PLT 311.0 01/06/2019   GLUCOSE 99 01/06/2019   CHOL 180 04/26/2018   TRIG 61.0 04/26/2018   HDL 89.70 04/26/2018   LDLDIRECT 147.7 12/03/2007   LDLCALC 78 04/26/2018   ALT 28 01/06/2019   AST 37 01/06/2019   NA 140 01/06/2019   K 4.2 01/06/2019   CL 102 01/06/2019   CREATININE 1.05 01/06/2019   BUN 24 (H) 01/06/2019   CO2 29 01/06/2019   TSH 3.05 01/06/2019   PSA 0.80 04/24/2018   INR 1.1 (H) 04/24/2018   HGBA1C 5.5 02/20/2017    Dg Cervical Spine Complete  Result Date: 09/18/2018 CLINICAL DATA:  Chronic cervicalgia EXAM: CERVICAL SPINE - COMPLETE 4+ VIEW COMPARISON:  CT neck with bony reformats May 22, 2011 FINDINGS: Frontal, lateral, open-mouth odontoid, and bilateral oblique views were obtained. There is no fracture. There is 3 mm of anterolisthesis of C3 on C4. No other spondylolisthesis is evident. Prevertebral soft tissues and predental space regions are normal. There is partial ankylosis at C4-5. There is severe disc space narrowing at C5-6 and C6-7. There is facet hypertrophy with exit foraminal narrowing at all levels with the exception of C2-3 bilaterally. No erosive changes evident. Lung apices are clear. There is reversal of lordotic curvature. Lung apices are clear. IMPRESSION: Extensive arthropathy, most marked at C4-5. Mild spondylolisthesis at C3-4 is felt to be due to underlying spondylosis. No other spondylolisthesis evident. No fracture. Reversal of  lordotic curvature is likely due to chronic muscle spasm. Electronically Signed   By: Lowella Grip III M.D.   On: 09/18/2018 14:19    Assessment & Plan:   Jasan was seen today for hypertension.  Diagnoses and all orders for this visit:   Thiamine deficiency- I will monitor his thiamine level. -     CBC with Differential/Platelet; Future -     Vitamin B1; Future  Acquired hypothyroidism- His TSH is in the normal range.  He will remain on the current dose of levothyroxine. -  Hepatic function panel; Future -     TSH; Future  Essential hypertension, benign- His blood pressure is not adequately well controlled.  The elevation is most likely due to the addition of NSAIDs as well as not working on his lifestyle modifications.  He is not willing to add another agent at this time.  His labs are negative for secondary causes or endorgan damage.  I have asked him to try to stop taking ibuprofen and to improve his lifestyle modifications.  He will stay on the current 2 agents and will recheck his blood pressure in 2 to 3 months. -     Basic metabolic panel; Future -     Hepatic function panel; Future -     Drug Abuse 10-50+Ethanol, U; Future -     Urinalysis, Routine w reflex microscopic; Future -     VITAMIN D 25 Hydroxy (Vit-D Deficiency, Fractures); Future -     EKG 12-Lead  Hypomagnesemia -     Basic metabolic panel; Future -     Magnesium; Future  Lead exposure- His lead level continues to be mildly elevated.  I have asked him to avoid light exposure. -     Lead, blood; Future   I am having Brandon [name]. Brandon "Skip" maintain his aspirin, Magnesium Oxide, Synthroid, carvedilol, Vitamin B1, gabapentin, rosuvastatin, tiZANidine, Duexis, zolpidem, omeprazole, and candesartan.  No orders of the defined types were placed in this encounter.    Follow-up: Return in about 3 months (around 04/07/2019).  Scarlette Calico, MD

## 2019-01-06 NOTE — Patient Instructions (Signed)

## 2019-01-08 ENCOUNTER — Other Ambulatory Visit (INDEPENDENT_AMBULATORY_CARE_PROVIDER_SITE_OTHER): Payer: Medicare Other

## 2019-01-08 DIAGNOSIS — I10 Essential (primary) hypertension: Secondary | ICD-10-CM | POA: Diagnosis not present

## 2019-01-08 LAB — URINALYSIS, ROUTINE W REFLEX MICROSCOPIC
Bilirubin Urine: NEGATIVE
Hgb urine dipstick: NEGATIVE
Leukocytes,Ua: NEGATIVE
Nitrite: NEGATIVE
RBC / HPF: NONE SEEN (ref 0–?)
Specific Gravity, Urine: >=1.03 — AB (ref 1.000–1.030)
Total Protein, Urine: NEGATIVE
Urine Glucose: NEGATIVE
Urobilinogen, UA: 0.2 (ref 0.0–1.0)
pH: 5.5 (ref 5.0–8.0)

## 2019-01-09 LAB — DRUG ABUSE 10-50+ETHANOL, U
ALCOHOL, ETHYL (U): NEGATIVE
AMPHETAMINES (1000 ng/mL SCRN): NEGATIVE
BARBITURATES: NEGATIVE
BENZODIAZEPINES: NEGATIVE
COCAINE METABOLITES: NEGATIVE
MARIJUANA MET (50 ng/mL SCRN): NEGATIVE
METHADONE: NEGATIVE
METHAQUALONE: NEGATIVE
OPIATES: NEGATIVE
PHENCYCLIDINE: NEGATIVE
PROPOXYPHENE: NEGATIVE

## 2019-01-13 ENCOUNTER — Other Ambulatory Visit: Payer: Self-pay | Admitting: Internal Medicine

## 2019-01-13 ENCOUNTER — Encounter: Payer: Self-pay | Admitting: Internal Medicine

## 2019-01-13 DIAGNOSIS — E538 Deficiency of other specified B group vitamins: Secondary | ICD-10-CM

## 2019-01-13 DIAGNOSIS — F04 Amnestic disorder due to known physiological condition: Secondary | ICD-10-CM

## 2019-01-13 LAB — LEAD, BLOOD (ADULT >= 16 YRS): Lead: 20 ug/dL — ABNORMAL HIGH (ref <5)

## 2019-01-13 LAB — VITAMIN B1: Vitamin B1 (Thiamine): 87 nmol/L — ABNORMAL HIGH (ref 8–30)

## 2019-01-13 MED ORDER — VITAMIN B1 100 MG PO TABS: 0.5000 | ORAL_TABLET | ORAL | 1 refills | Status: DC

## 2019-01-15 ENCOUNTER — Ambulatory Visit: Admit: 2019-01-15 | Payer: Medicare PPO

## 2019-01-15 ENCOUNTER — Ambulatory Visit: Admitting: Ophthalmology

## 2019-01-15 ENCOUNTER — Ambulatory Visit (HOSPITAL_BASED_OUTPATIENT_CLINIC_OR_DEPARTMENT_OTHER)

## 2019-01-15 DIAGNOSIS — H2511 Age-related nuclear cataract, right eye: Principal | ICD-10-CM

## 2019-01-16 ENCOUNTER — Other Ambulatory Visit: Payer: Self-pay | Admitting: Family

## 2019-01-16 DIAGNOSIS — I712 Thoracic aortic aneurysm, without rupture, unspecified: Secondary | ICD-10-CM

## 2019-01-16 DIAGNOSIS — I10 Essential (primary) hypertension: Secondary | ICD-10-CM

## 2019-01-16 DIAGNOSIS — E039 Hypothyroidism, unspecified: Secondary | ICD-10-CM

## 2019-01-17 MED ORDER — LEVOTHYROXINE SODIUM 100 MCG PO TABS: 100.0000 ug | ORAL_TABLET | Freq: Every day | ORAL | 1 refills | Status: DC

## 2019-01-17 MED ORDER — CARVEDILOL 6.25 MG PO TABS: 6.2500 mg | ORAL_TABLET | Freq: Two times a day (BID) | ORAL | 1 refills | Status: DC

## 2019-02-03 ENCOUNTER — Ambulatory Visit

## 2019-02-04 ENCOUNTER — Other Ambulatory Visit: Payer: Self-pay | Admitting: Internal Medicine

## 2019-02-04 DIAGNOSIS — E785 Hyperlipidemia, unspecified: Secondary | ICD-10-CM

## 2019-02-04 DIAGNOSIS — I712 Thoracic aortic aneurysm, without rupture, unspecified: Secondary | ICD-10-CM

## 2019-02-11 MED ORDER — prednisolone acetate 1 % eye drops,suspension: Freq: Four times a day (QID) | 3 refills | 30 days | Status: AC

## 2019-02-11 MED ORDER — ketorolac 0.5 % eye drops: Freq: Four times a day (QID) | 3 refills | 30 days | Status: AC

## 2019-02-11 MED ORDER — ofloxacin 0.3 % eye drops: Freq: Four times a day (QID) | 3 refills | 30 days | Status: AC

## 2019-02-14 ENCOUNTER — Ambulatory Visit: Admitting: Neurology

## 2019-02-14 ENCOUNTER — Ambulatory Visit

## 2019-02-14 NOTE — Progress Notes (Signed)
* * *      [name] [name], [name] [name] **DOB:** 06/04/1950 [phone] yo M) **Acc No.** [id number] **DOS:**  02/14/2019    ---       **[name] [name], [name] [name]**    ------    72 Y old Male, DOB: 10/04/1950    [address], [place], [place] [address]    Home: [phone]    Provider: [name]        * * *    Telephone Encounter    ---    Answered by  [name] Date: 02/14/2019       Time: 11:35 AM    Message                     Document attached from fax inbox.        ------                * * *                ---          * * *         Provider: [name], Aldred Mase 02/14/2019    ---    Note generated by eClinicalWorks EMR/PM Software ([email or web address])

## 2019-02-14 NOTE — Progress Notes (Signed)
* * *      [name], [name] [name] **DOB:** [birth year] [phone] yo M) **Acc No.** [id number] **DOS:**  02/14/2019    ---       **[name] [name], [name] [name]**    ------    83 Y old Male, DOB: [birth year]    [address], [place], [place] [address]    Home: [phone]    Provider: Lewis [name]        * * *    Telephone Encounter    ---    Answered by  [name] [name] Date: 02/14/2019       Time: 11:37 AM    Message                     Document attached from fax inbox.        ------            Action Taken                     [name] [name]  02/14/2019 12:04:57 PM > communicated with patient- recheck level by end of year or just after 1/1 JO                     * * *                ---          * * *         Provider: [name], [name] [name] 02/14/2019    ---    Note generated by eClinicalWorks EMR/PM Software ([email or web address])

## 2019-02-21 ENCOUNTER — Ambulatory Visit: Admit: 2019-02-21 | Payer: Medicare PPO

## 2019-02-21 ENCOUNTER — Ambulatory Visit (HOSPITAL_BASED_OUTPATIENT_CLINIC_OR_DEPARTMENT_OTHER)

## 2019-02-21 ENCOUNTER — Ambulatory Visit

## 2019-02-21 ENCOUNTER — Ambulatory Visit: Admitting: Ophthalmology

## 2019-02-21 DIAGNOSIS — Z9841 Cataract extraction status, right eye: Secondary | ICD-10-CM

## 2019-02-21 DIAGNOSIS — Z9842 Cataract extraction status, left eye: Principal | ICD-10-CM

## 2019-02-24 ENCOUNTER — Ambulatory Visit

## 2019-03-19 ENCOUNTER — Ambulatory Visit: Admit: 2019-03-19 | Payer: Medicare PPO

## 2019-03-19 ENCOUNTER — Ambulatory Visit (HOSPITAL_BASED_OUTPATIENT_CLINIC_OR_DEPARTMENT_OTHER)

## 2019-03-19 ENCOUNTER — Ambulatory Visit: Admitting: Ophthalmology

## 2019-03-19 DIAGNOSIS — Z9841 Cataract extraction status, right eye: Principal | ICD-10-CM

## 2019-03-24 ENCOUNTER — Other Ambulatory Visit: Payer: Self-pay | Admitting: Internal Medicine

## 2019-03-24 ENCOUNTER — Encounter: Payer: Self-pay | Admitting: *Deleted

## 2019-03-24 DIAGNOSIS — I10 Essential (primary) hypertension: Secondary | ICD-10-CM

## 2019-03-24 DIAGNOSIS — K219 Gastro-esophageal reflux disease without esophagitis: Secondary | ICD-10-CM

## 2019-03-24 DIAGNOSIS — F5101 Primary insomnia: Secondary | ICD-10-CM

## 2019-03-26 ENCOUNTER — Other Ambulatory Visit: Payer: Self-pay

## 2019-03-26 ENCOUNTER — Encounter: Payer: Self-pay | Admitting: Internal Medicine

## 2019-03-26 ENCOUNTER — Ambulatory Visit (INDEPENDENT_AMBULATORY_CARE_PROVIDER_SITE_OTHER): Payer: Medicare Other | Admitting: Family Medicine

## 2019-03-26 ENCOUNTER — Other Ambulatory Visit (INDEPENDENT_AMBULATORY_CARE_PROVIDER_SITE_OTHER): Payer: Medicare Other

## 2019-03-26 ENCOUNTER — Encounter: Payer: Self-pay | Admitting: Family Medicine

## 2019-03-26 ENCOUNTER — Ambulatory Visit (INDEPENDENT_AMBULATORY_CARE_PROVIDER_SITE_OTHER): Payer: Medicare Other | Admitting: Internal Medicine

## 2019-03-26 VITALS — BP 132/84 | HR 64 | Temp 97.8°F | Resp 16 | Ht 70.0 in | Wt 184.2 lb

## 2019-03-26 DIAGNOSIS — F5101 Primary insomnia: Secondary | ICD-10-CM

## 2019-03-26 DIAGNOSIS — E039 Hypothyroidism, unspecified: Secondary | ICD-10-CM

## 2019-03-26 DIAGNOSIS — E519 Thiamine deficiency, unspecified: Secondary | ICD-10-CM

## 2019-03-26 DIAGNOSIS — E785 Hyperlipidemia, unspecified: Secondary | ICD-10-CM

## 2019-03-26 DIAGNOSIS — M5030 Other cervical disc degeneration, unspecified cervical region: Secondary | ICD-10-CM

## 2019-03-26 DIAGNOSIS — I10 Essential (primary) hypertension: Secondary | ICD-10-CM

## 2019-03-26 DIAGNOSIS — K219 Gastro-esophageal reflux disease without esophagitis: Secondary | ICD-10-CM

## 2019-03-26 DIAGNOSIS — Z77011 Contact with and (suspected) exposure to lead: Secondary | ICD-10-CM

## 2019-03-26 LAB — BASIC METABOLIC PANEL
BUN: 25 mg/dL — ABNORMAL HIGH (ref 6–23)
CO2: 27 mEq/L (ref 19–32)
Calcium: 9.4 mg/dL (ref 8.4–10.5)
Chloride: 102 mEq/L (ref 96–112)
Creatinine, Ser: 1 mg/dL (ref 0.40–1.50)
GFR: 74.54 mL/min (ref >60.00)
Glucose, Bld: 101 mg/dL — ABNORMAL HIGH (ref 70–99)
Potassium: 3.8 mEq/L (ref 3.5–5.1)
Sodium: 137 mEq/L (ref 135–145)

## 2019-03-26 LAB — LIPID PANEL
Cholesterol: 159 mg/dL (ref 0–200)
HDL: 72.8 mg/dL (ref >39.00)
LDL Cholesterol: 72 mg/dL (ref 0–99)
NonHDL: 85.77
Total CHOL/HDL Ratio: 2
Triglycerides: 67 mg/dL (ref 0.0–149.0)
VLDL: 13.4 mg/dL (ref 0.0–40.0)

## 2019-03-26 LAB — CBC WITH DIFFERENTIAL/PLATELET
Basophils Absolute: 0.1 10*3/uL (ref 0.0–0.1)
Basophils Relative: 1 % (ref 0.0–3.0)
Eosinophils Absolute: 0.1 10*3/uL (ref 0.0–0.7)
Eosinophils Relative: 1.9 % (ref 0.0–5.0)
HCT: 42.7 % (ref 39.0–52.0)
Hemoglobin: 14.5 g/dL (ref 13.0–17.0)
Lymphocytes Relative: 14.4 % (ref 12.0–46.0)
Lymphs Abs: 1.1 10*3/uL (ref 0.7–4.0)
MCHC: 33.9 g/dL (ref 30.0–36.0)
MCV: 91.8 fl (ref 78.0–100.0)
Monocytes Absolute: 0.7 10*3/uL (ref 0.1–1.0)
Monocytes Relative: 9.7 % (ref 3.0–12.0)
Neutro Abs: 5.5 10*3/uL (ref 1.4–7.7)
Neutrophils Relative %: 73 % (ref 43.0–77.0)
Platelets: 362 10*3/uL (ref 150.0–400.0)
RBC: 4.65 Mil/uL (ref 4.22–5.81)
RDW: 13.9 % (ref 11.5–15.5)
WBC: 7.5 10*3/uL (ref 4.0–10.5)

## 2019-03-26 LAB — MAGNESIUM: Magnesium: 1.5 mg/dL (ref 1.5–2.5)

## 2019-03-26 LAB — TSH: TSH: 1.97 u[IU]/mL (ref 0.35–4.50)

## 2019-03-26 MED ORDER — OMEPRAZOLE 40 MG PO CPDR: 40.0000 mg | DELAYED_RELEASE_CAPSULE | Freq: Every day | ORAL | 1 refills | Status: DC

## 2019-03-26 MED ORDER — ZOLPIDEM TARTRATE 10 MG PO TABS: ORAL_TABLET | ORAL | 0 refills | Status: DC

## 2019-03-26 MED ORDER — GABAPENTIN 300 MG PO CAPS: 300.0000 mg | ORAL_CAPSULE | Freq: Every day | ORAL | 3 refills | Status: DC

## 2019-03-26 MED ORDER — CANDESARTAN CILEXETIL 16 MG PO TABS: 16.0000 mg | ORAL_TABLET | Freq: Every day | ORAL | 1 refills | Status: DC

## 2019-03-26 NOTE — Progress Notes (Signed)
Subjective:  Patient ID: Brandon [name], male    DOB: [birth year]  Age: 68 y.o. MRN: [id number]  CC: Hypertension, Hypothyroidism, and Gastroesophageal Reflux  This visit occurred during the SARS-CoV-2 public health emergency.  Safety protocols were in place, including screening questions prior to the visit, additional usage of staff PPE, and extensive cleaning of exam room while observing appropriate contact time as indicated for disinfecting solutions.   HPI Brandon [name] presents for f/up - He tells me his blood pressure has been well controlled.  He is very active and denies even any recent episodes of chest pain, shortness of breath, palpitations, edema, or fatigue.  Outpatient Medications Prior to Visit  Medication Sig Dispense Refill  . aspirin 81 MG tablet Take 81 mg by mouth daily.    . carvedilol (COREG) 6.25 MG tablet Take 1 tablet (6.25 mg total) by mouth 2 (two) times daily with a meal. 180 tablet 1  . gabapentin (NEURONTIN) 300 MG capsule Take 1 capsule (300 mg total) by mouth at bedtime. 90 capsule 3  . levothyroxine (SYNTHROID) 100 MCG tablet Take 1 tablet (100 mcg total) by mouth daily. 90 tablet 1  . Magnesium Oxide 250 MG TABS TAKE 1 TABLET BY MOUTH TWICE A DAY 180 tablet 1  . rosuvastatin (CRESTOR) 20 MG tablet TAKE 1 TABLET BY MOUTH EVERY DAY 90 tablet 1  . Thiamine HCl (VITAMIN B1) 100 MG TABS Take 0.5 tablets (50 mg total) by mouth every other day. 45 tablet 1  . candesartan (ATACAND) 16 MG tablet Take 1 tablet (16 mg total) by mouth daily. 90 tablet 0  . DUEXIS 800-26.6 MG TABS TAKE ONE TABLET BY MOUTH THREE TIMES DAILY 90 tablet 2  . gabapentin (NEURONTIN) 300 MG capsule nightly 90 capsule 3  . omeprazole (PRILOSEC) 40 MG capsule Take 1 capsule (40 mg total) by mouth daily. 90 capsule 0  . tiZANidine (ZANAFLEX) 4 MG tablet TAKE 0.5-1 TABLETS (2-4 MG TOTAL) BY MOUTH EVERY 8 (EIGHT) HOURS AS NEEDED FOR MUSCLE SPASMS. 90 tablet 0  . zolpidem (AMBIEN) 10 MG tablet TAKE  1 TABLET AT BEDTIME ASNEEDED 90 tablet 0   No facility-administered medications prior to visit.    ROS Review of Systems  Constitutional: Negative.  Negative for diaphoresis, fatigue and unexpected weight change.  HENT: Negative.  Negative for trouble swallowing.   Respiratory: Negative for cough, chest tightness, shortness of breath and wheezing.   Cardiovascular: Negative for chest pain, palpitations and leg swelling.  Gastrointestinal: Negative for abdominal pain, constipation, diarrhea, nausea and vomiting.  Endocrine: Negative for cold intolerance and heat intolerance.  Genitourinary: Negative.  Negative for difficulty urinating.  Musculoskeletal: Negative.  Negative for myalgias.  Skin: Negative.   Neurological: Negative.  Negative for dizziness, weakness and light-headedness.  Hematological: Negative for adenopathy. Does not bruise/bleed easily.  Psychiatric/Behavioral: Positive for sleep disturbance. Negative for decreased concentration and dysphoric mood. The patient is not nervous/anxious.     Objective:  BP 132/84 (BP Location: Left Arm, Patient Position: Sitting, Cuff Size: Normal)   Pulse 64   Temp 97.8 F (36.6 C) (Oral)   Resp 16   Ht 5\' 10"  (1.778 m)   Wt 184 lb 4 oz (83.6 kg)   SpO2 98%   BMI 26.44 kg/m   BP Readings from Last 3 Encounters:  03/26/19 132/84  03/26/19 106/68  01/06/19 (!) 166/100    Wt Readings from Last 3 Encounters:  03/26/19 184 lb 4 oz (83.6 kg)  01/06/19  184 lb 4 oz (83.6 kg)  12/25/18 183 lb (83 kg)    Physical Exam Vitals reviewed.  Constitutional:      Appearance: Normal appearance.  HENT:     Nose: Nose normal.     Mouth/Throat:     Mouth: Mucous membranes are moist.  Eyes:     General: No scleral icterus.    Conjunctiva/sclera: Conjunctivae normal.  Neck:     Thyroid: No thyroid mass, thyromegaly or thyroid tenderness.   Cardiovascular:     Rate and Rhythm: Normal rate and regular rhythm.     Heart sounds: No  murmur.  Pulmonary:     Effort: Pulmonary effort is normal.     Breath sounds: No stridor. No wheezing, rhonchi or rales.  Abdominal:     General: Abdomen is flat. There is no distension.     Palpations: There is no hepatomegaly, splenomegaly or mass.     Tenderness: There is no abdominal tenderness.  Musculoskeletal:        General: Normal range of motion.     Cervical back: Normal range of motion and neck supple. No edema or tenderness.     Right lower leg: No edema.     Left lower leg: No edema.  Lymphadenopathy:     Cervical: No cervical adenopathy.  Skin:    General: Skin is warm.     Findings: No rash.  Neurological:     General: No focal deficit present.     Mental Status: He is alert.  Psychiatric:        Mood and Affect: Mood normal.        Behavior: Behavior normal.     Lab Results  Component Value Date   WBC 7.5 03/26/2019   HGB 14.5 03/26/2019   HCT 42.7 03/26/2019   PLT 362.0 03/26/2019   GLUCOSE 101 (H) 03/26/2019   CHOL 159 03/26/2019   TRIG 67.0 03/26/2019   HDL 72.80 03/26/2019   LDLDIRECT 147.7 12/03/2007   LDLCALC 72 03/26/2019   ALT 28 01/06/2019   AST 37 01/06/2019   NA 137 03/26/2019   K 3.8 03/26/2019   CL 102 03/26/2019   CREATININE 1.00 03/26/2019   BUN 25 (H) 03/26/2019   CO2 27 03/26/2019   TSH 1.97 03/26/2019   PSA 0.80 04/24/2018   INR 1.1 (H) 04/24/2018   HGBA1C 5.5 02/20/2017    DG Cervical Spine Complete  Result Date: 09/18/2018 CLINICAL DATA:  Chronic cervicalgia EXAM: CERVICAL SPINE - COMPLETE 4+ VIEW COMPARISON:  CT neck with bony reformats May 22, 2011 FINDINGS: Frontal, lateral, open-mouth odontoid, and bilateral oblique views were obtained. There is no fracture. There is 3 mm of anterolisthesis of C3 on C4. No other spondylolisthesis is evident. Prevertebral soft tissues and predental space regions are normal. There is partial ankylosis at C4-5. There is severe disc space narrowing at C5-6 and C6-7. There is facet  hypertrophy with exit foraminal narrowing at all levels with the exception of C2-3 bilaterally. No erosive changes evident. Lung apices are clear. There is reversal of lordotic curvature. Lung apices are clear. IMPRESSION: Extensive arthropathy, most marked at C4-5. Mild spondylolisthesis at C3-4 is felt to be due to underlying spondylosis. No other spondylolisthesis evident. No fracture. Reversal of lordotic curvature is likely due to chronic muscle spasm. Electronically Signed   By: Lowella Grip III M.D.   On: 09/18/2018 14:19    Assessment & Plan:   Hartzell was seen today for hypertension, hypothyroidism and gastroesophageal  reflux.  Diagnoses and all orders for this visit:  Essential hypertension, benign- His blood pressure is well controlled.  Electrolytes and renal function are normal.  Will continue the current dose of candesartan. -     Basic metabolic panel; Future -     CBC with Differential; Future -     candesartan (ATACAND) 16 MG tablet; Take 1 tablet (16 mg total) by mouth daily.  Acquired hypothyroidism- His TSH is in the normal range.  He will remain on the current dose of levothyroxine. -     TSH; Future  Hyperlipidemia with target LDL less than 130 - He has achieved his LDL goal and is doing well on the statin. -     Lipid panel; Future  Hypomagnesemia- His magnesium level is normal now. -     Magnesium; Future  Lead exposure  Thiamine deficiency- I will monitor his thiamine level. -     Vitamin B1; Future -     CBC with Differential; Future  Primary insomnia -     zolpidem (AMBIEN) 10 MG tablet; TAKE 1 TABLET AT BEDTIME ASNEEDED  Gastroesophageal reflux disease without esophagitis- His sx's are well controlled on the PPI. -     omeprazole (PRILOSEC) 40 MG capsule; Take 1 capsule (40 mg total) by mouth daily.   I have discontinued Gildardo Griffes. Taitano "Skip"'s tiZANidine and Duexis. I am also having him maintain his aspirin, Magnesium Oxide, Vitamin B1,  levothyroxine, carvedilol, rosuvastatin, gabapentin, candesartan, zolpidem, and omeprazole.  Meds ordered this encounter  Medications  . candesartan (ATACAND) 16 MG tablet    Sig: Take 1 tablet (16 mg total) by mouth daily.    Dispense:  90 tablet    Refill:  1  . zolpidem (AMBIEN) 10 MG tablet    Sig: TAKE 1 TABLET AT BEDTIME ASNEEDED    Dispense:  90 tablet    Refill:  0  . omeprazole (PRILOSEC) 40 MG capsule    Sig: Take 1 capsule (40 mg total) by mouth daily.    Dispense:  90 capsule    Refill:  1     Follow-up: Return in about 6 months (around 09/24/2019).  Scarlette Calico, MD

## 2019-03-26 NOTE — Progress Notes (Signed)
[name] [name], [place] [address] Phone: [phone] Subjective:   [name], am serving as a scribe for Dr. Hulan Saas. This visit occurred during the SARS-CoV-2 public health emergency.  Safety protocols were in place, including screening questions prior to the visit, additional usage of staff PPE, and extensive cleaning of exam room while observing appropriate contact time as indicated for disinfecting solutions.  I'm seeing this patient by the request  of:    CC: Neck pain follow-up  [id number]   12/25/2018 Discussed with patient in great length, discussed home exercises, icing regimen, with activities of doing which wants to avoid.  Patient is to increase activity slowly over the course the next several weeks.  Patient will continue on gabapentin movement when to go up anymore.  And ibuprofen for breakthrough as well as muscle relaxers which he has not used.  Patient will follow-up again in 3 to 4 months  Update 03/26/2019 [name] is a 68 y.o. male coming in with complaint of neck pain. Patient feels that he is doing better since last visit. Did a lot of lifting over the weekend. Has not had to use any NSAIDs for pain relief since last visit.  Patient would state that he is feeling significantly better overall.  Never stops him from any activity and is resting comfortably at night.     Past Medical History:  Diagnosis Date  . Allergy    mild  . Aortic aneurysm (HCC)    small and watching  . GERD (gastroesophageal reflux disease)   . Head and neck cancer    head and neck ca dx 03/2009  . Hyperlipidemia   . Hypertension   . oropharyngeal ca dx'd 03/2009   chemo/xrt comp 06/2009  . Thyroid disease    Past Surgical History:  Procedure Laterality Date  . HERNIA REPAIR     inguinal  . hip replacemnt left Left   . Left eye    . stmach surgery     repair peg tube site   Social History   Socioeconomic History   . Marital status: Married    Spouse name: Not on file  . Number of children: 2  . Years of education: 39  . Highest education level: Not on file  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 History  . 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 Smoking status: Former Smoker    Packs/day: 1.50    Years: 35.00    Pack years: 52.50    Types: Cigarettes, Cigars    Start date: 05/16/1973    Quit date: 04/19/2009    Years since quitting: 9.9  . Smokeless tobacco: Never Used  . Tobacco comment: quit smoking 5 years sago  Substance and Sexual Activity  . Alcohol use: Yes    Alcohol/week: 30.0 standard drinks    Types: 30 Shots of liquor per week  . Drug use: No  . Sexual activity: Yes    Partners: Female  Other Topics Concern  . Not on file  Social History Narrative   HSG, [employer]-no degree. married - '76 - 5 years, divorced; remarried  '84. 1 son - '85; 1 daughter '89. 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    Social Determinants of Health   Financial Resource Strain:   . Difficulty of Paying Living Expenses: Not on file  Food Insecurity:   . Worried About Charity fundraiser in the Last Year:  Not on file  . Ran Out of Food in the Last Year: Not on file  Transportation Needs:   . Lack of Transportation (Medical): Not on file  . Lack of Transportation (Non-Medical): Not on file  Physical Activity:   . Days of Exercise per Week: Not on file  . Minutes of Exercise per Session: Not on file  Stress:   . Feeling of Stress : Not on file  Social Connections:   . Frequency of Communication with Friends and Family: Not on file  . Frequency of Social Gatherings with Friends and Family: Not on file  . Attends Religious Services: Not on file  . Active Member of Clubs or Organizations: Not on file  . Attends Archivist Meetings: Not on file  . Marital Status: Not on file   Allergies  Allergen Reactions  . Benicar [Olmesartan]     fatigue  . Amlodipine  Swelling   Family History  Problem Relation Age of Onset  . Cancer Mother        lymphoma  . Colon cancer Mother   . Cancer Father        lung (smoker)  . Hyperlipidemia Father   . Hypertension Father   . Diabetes Father   . Esophageal cancer Neg Hx   . Rectal cancer Neg Hx   . Stomach cancer Neg Hx     Current Outpatient Medications (Endocrine & Metabolic):  .  levothyroxine (SYNTHROID) 100 MCG tablet, Take 1 tablet (100 mcg total) by mouth daily.  Current Outpatient Medications (Cardiovascular):  .  candesartan (ATACAND) 16 MG tablet, Take 1 tablet (16 mg total) by mouth daily. .  carvedilol (COREG) 6.25 MG tablet, Take 1 tablet (6.25 mg total) by mouth 2 (two) times daily with a meal. .  rosuvastatin (CRESTOR) 20 MG tablet, TAKE 1 TABLET BY MOUTH EVERY DAY   Current Outpatient Medications (Analgesics):  .  aspirin 81 MG tablet, Take 81 mg by mouth daily. .  DUEXIS 800-26.6 MG TABS, TAKE ONE TABLET BY MOUTH THREE TIMES DAILY   Current Outpatient Medications (Other):  .  gabapentin (NEURONTIN) 300 MG capsule, nightly .  Magnesium Oxide 250 MG TABS, TAKE 1 TABLET BY MOUTH TWICE A DAY .  omeprazole (PRILOSEC) 40 MG capsule, Take 1 capsule (40 mg total) by mouth daily. .  Thiamine HCl (VITAMIN B1) 100 MG TABS, Take 0.5 tablets (50 mg total) by mouth every other day. Marland Kitchen  tiZANidine (ZANAFLEX) 4 MG tablet, TAKE 0.5-1 TABLETS (2-4 MG TOTAL) BY MOUTH EVERY 8 (EIGHT) HOURS AS NEEDED FOR MUSCLE SPASMS. Marland Kitchen  zolpidem (AMBIEN) 10 MG tablet, TAKE 1 TABLET AT BEDTIME ASNEEDED    Past medical history, social, surgical and family history all reviewed in electronic medical record.  No pertanent information unless stated regarding to the chief complaint.   Review of Systems:  No headache, visual changes, nausea, vomiting, diarrhea, constipation, dizziness, abdominal pain, skin rash, fevers, chills, night sweats, weight loss, swollen lymph nodes, body aches, joint swelling, , chest pain,  shortness of breath, mood changes.  Mild positive muscle aches  Objective  There were no vitals taken for this visit. Systems examined below as of    General: No apparent distress alert and oriented x3 mood and affect normal, dressed appropriately.  HEENT: Pupils equal, extraocular movements intact  Respiratory: Patient's speak in full sentences and does not appear short of breath  Cardiovascular: No lower extremity edema, non tender, no erythema  Skin: Warm dry intact with  no signs of infection or rash on extremities or on axial skeleton.  Abdomen: Soft nontender  Neuro: Cranial nerves II through XII are intact, neurovascularly intact in all extremities with 2+ DTRs and 2+ pulses.  Lymph: No lymphadenopathy of posterior or anterior cervical chain or axillae bilaterally.  Gait normal with good balance and coordination.  MSK:   Neck exam does show significant loss of lordosis.  Patient has only 10 degrees of extension.  Crepitus noted with range of motion.  Negative Spurling's today.  Good grip strength in the hands bilaterally and deep tendon reflexes intact    Impression and Recommendations:     . The above documentation has been reviewed and is accurate and complete Lyndal Pulley, DO       Note: This dictation was prepared with Dragon dictation along with smaller phrase technology. Any transcriptional errors that result from this process are unintentional.

## 2019-03-26 NOTE — Patient Instructions (Addendum)
  [address] [place],  [address] Phone [phone]  Happy Holidays! Gabapentin at night See me when you need me

## 2019-03-26 NOTE — Assessment & Plan Note (Signed)
Severe arthritic changes but doing very well with conservative therapy.  Continue the gabapentin at the same dose of 300 mg at night, has anti-inflammatories for any breakthrough pain as well as muscle relaxer.  As long as patient is well can follow-up with me more on an as-needed basis

## 2019-03-26 NOTE — Patient Instructions (Signed)

## 2019-03-30 LAB — VITAMIN B1: Vitamin B1 (Thiamine): 21 nmol/L (ref 8–30)

## 2019-03-31 ENCOUNTER — Encounter: Payer: Self-pay | Admitting: Internal Medicine

## 2019-04-09 ENCOUNTER — Ambulatory Visit

## 2019-04-15 ENCOUNTER — Ambulatory Visit: Admit: 2019-04-15 | Payer: Medicare PPO | Attending: Specialist | Admitting: Specialist

## 2019-04-15 ENCOUNTER — Ambulatory Visit (HOSPITAL_BASED_OUTPATIENT_CLINIC_OR_DEPARTMENT_OTHER)

## 2019-04-15 ENCOUNTER — Ambulatory Visit: Admitting: Specialist

## 2019-04-15 DIAGNOSIS — H35342 Macular cyst, hole, or pseudohole, left eye: Principal | ICD-10-CM

## 2019-04-23 ENCOUNTER — Ambulatory Visit: Admit: 2019-04-23 | Payer: Medicare PPO

## 2019-04-23 ENCOUNTER — Ambulatory Visit: Admitting: Ophthalmology

## 2019-04-23 ENCOUNTER — Ambulatory Visit (HOSPITAL_BASED_OUTPATIENT_CLINIC_OR_DEPARTMENT_OTHER): Admitting: Ophthalmology

## 2019-04-23 ENCOUNTER — Other Ambulatory Visit: Payer: Self-pay | Admitting: Internal Medicine

## 2019-04-23 DIAGNOSIS — E538 Deficiency of other specified B group vitamins: Secondary | ICD-10-CM

## 2019-04-23 DIAGNOSIS — H02403 Unspecified ptosis of bilateral eyelids: Principal | ICD-10-CM

## 2019-05-06 LAB — LDL (EXT): LDL Cholesterol (EXT): 106 mg/dL (ref <=130)

## 2019-05-06 LAB — PSA DIAGNOSTIC (EXT): PSA (EXT): 3.1 ng/mL (ref 0.0–5.0)

## 2019-05-07 LAB — HEMOGLOBIN A1C: HEMOGLOBIN A1C % (INT/EXT): 5.3 % (ref 4.6–5.6)

## 2019-05-09 ENCOUNTER — Ambulatory Visit

## 2019-06-10 ENCOUNTER — Ambulatory Visit

## 2019-06-23 DIAGNOSIS — R69 Illness, unspecified: Secondary | ICD-10-CM | POA: Diagnosis not present

## 2019-06-24 ENCOUNTER — Other Ambulatory Visit: Payer: Self-pay | Admitting: Internal Medicine

## 2019-06-24 DIAGNOSIS — F5101 Primary insomnia: Secondary | ICD-10-CM

## 2019-06-24 MED ORDER — ZOLPIDEM TARTRATE 10 MG PO TABS: ORAL_TABLET | ORAL | 0 refills | Status: DC

## 2019-06-25 ENCOUNTER — Ambulatory Visit: Admitting: Neurology

## 2019-06-25 NOTE — Progress Notes (Signed)
* * *      Parlier, [name] [name] **DOB:** 03-19-1951 [phone] yo M) **Acc No.** [id number] **DOS:**  06/25/2019    ---       **[name], [name] Brunt**    ------    59 Y old Male, DOB: [birth year]    [address], [place], [place] [address]    Home: [phone]    Provider: Lewis [name]        * * *    Telephone Encounter    ---    Answered by  [name] [name] Date: 06/25/2019       Time: 04:12 PM    Reason  Dilantin level 20    ------            Action Taken                     [name] [name]  06/25/2019 4:12:51 PM > sort of hard to get dilantin 15-20- up/down- repeat blood draw- we have been emailing but if you can pls just check in with him that would be good, if ok, repeat 1 month for now/ not ataxic and so forth thx [name] [name]  06/27/2019 10:37:09 AM > called patient at the number above.  Patient reports he is doing well, is feeling fine, no dizziness. He will get another lab in a month or sooner if he is not feeling well and he will let us know.                    * * *                ---          * * *         Provider: [name], [name] [name] 06/25/2019    ---    Note generated by eClinicalWorks EMR/PM Software ([email or web address])

## 2019-07-22 ENCOUNTER — Ambulatory Visit: Payer: Medicare Other

## 2019-07-23 ENCOUNTER — Ambulatory Visit

## 2019-07-23 ENCOUNTER — Ambulatory Visit: Admitting: Neurology

## 2019-07-23 ENCOUNTER — Other Ambulatory Visit: Payer: Self-pay | Admitting: Internal Medicine

## 2019-07-23 DIAGNOSIS — E039 Hypothyroidism, unspecified: Secondary | ICD-10-CM

## 2019-07-23 DIAGNOSIS — I712 Thoracic aortic aneurysm, without rupture, unspecified: Secondary | ICD-10-CM

## 2019-07-23 DIAGNOSIS — I10 Essential (primary) hypertension: Secondary | ICD-10-CM

## 2019-07-23 NOTE — Progress Notes (Signed)
* * *      [name], [name] N **DOB:** 1950-07-14 [phone] yo M) **Acc No.** [id number] **DOS:**  07/23/2019    ---       **Tourangeau, [name]**    ------    54 Y old Male, DOB: [birth year]    [address] [place], [place] [address]    Home: [phone]    Provider: [name]        * * *    Telephone Encounter    ---    Answered by  [name] Date: 07/23/2019       Time: 01:04 PM    Reason  dilantin 18.8 from today    ------            Action Taken                     [name]  07/23/2019 1:04:49 PM > repeat 4-6 weeks or so c an you pls touch base with him thx [name]  07/23/2019 2:04:45 PM > called patient. Informed patient of results, he will repeat in 4-6 weeks.                    * * *                ---          * * *         Provider: [name], [name] 07/23/2019    ---    Note generated by eClinicalWorks EMR/PM Software ([email or web address])

## 2019-07-25 ENCOUNTER — Ambulatory Visit (INDEPENDENT_AMBULATORY_CARE_PROVIDER_SITE_OTHER): Payer: Medicare HMO

## 2019-07-25 ENCOUNTER — Other Ambulatory Visit: Payer: Self-pay

## 2019-07-25 VITALS — BP 140/80 | HR 60 | Temp 98.3°F | Resp 16 | Ht 70.0 in | Wt 186.0 lb

## 2019-07-25 DIAGNOSIS — Z0000 Encounter for general adult medical examination without abnormal findings: Secondary | ICD-10-CM | POA: Diagnosis not present

## 2019-07-25 NOTE — Progress Notes (Signed)
Subjective:   Brandon [name] is a 69 y.o. male who presents for Medicare Annual/Subsequent preventive examination.  Review of Systems:  No ROS Medicare Wellness Visit Cardiac Risk Factors include: advanced age (>76men, >21 women);hypertension;dyslipidemia     Objective:    Vitals: BP 140/80 (BP Location: Left Arm, Patient Position: Sitting, Cuff Size: Normal)   Pulse 60   Temp 98.3 F (36.8 C)   Resp 16   Ht 5\' 10"  (1.778 m)   Wt 186 lb (84.4 kg)   SpO2 98%   BMI 26.69 kg/m   Body mass index is 26.69 kg/m.  Advanced Directives 07/25/2019 11/20/2017 12/30/2014 10/28/2014 06/12/2014 02/13/2014  Does Patient Have a Medical Advance Directive? No No No No No No  Would patient like information on creating a medical advance directive? No - Patient declined No - Patient declined No - patient declined information - No - patient declined information -    Tobacco Social History   Tobacco Use  Smoking Status Former Smoker  . Packs/day: 1.50  . Years: 35.00  . Pack years: 52.50  . Types: Cigarettes, Cigars  . Start date: 05/16/1973  . Quit date: 04/19/2009  . 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 Lowell Guitar, LPN  Past Medical History:  Diagnosis Date  . Allergy    mild  . Aortic aneurysm (HCC)    small and watching  . GERD (gastroesophageal reflux  disease)   . Head and neck cancer    head and neck ca dx 03/2009  . Hyperlipidemia   . Hypertension   . oropharyngeal ca dx'd 03/2009   chemo/xrt comp 06/2009  . Thyroid disease    Past Surgical History:  Procedure Laterality Date  . HERNIA REPAIR     inguinal  . hip replacemnt left Left   . Left eye    . stmach surgery     repair peg tube site   Family History  Problem Relation Age of Onset  . Cancer Mother        lymphoma  . Colon cancer Mother   . Cancer Father        lung (smoker)  . Hyperlipidemia Father   . Hypertension Father   . Diabetes Father   . Esophageal cancer Neg Hx   . Rectal cancer Neg Hx   . Stomach cancer Neg Hx    Social History   Socioeconomic History  . Marital status: Married    Spouse name: Not on file  . Number of children: 2  . Years of education: 35  . Highest education level: Not on file  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 History  . 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 Smoking status: Former Smoker    Packs/day: 1.50    Years: 35.00    Pack years: 52.50  Types: Cigarettes, Cigars    Start date: 05/16/1973    Quit date: 04/19/2009    Years since quitting: 10.2  . Smokeless tobacco: Never Used  . Tobacco comment: quit smoking 5 years sago  Substance and Sexual Activity  . Alcohol use: Yes    Alcohol/week: 30.0 standard drinks    Types: 30 Shots of liquor per week  . Drug use: No  . Sexual activity: Yes    Partners: Female  Other Topics Concern  . Not on file  Social History Narrative   HSG, [employer]-no degree. married - '76 - 5 years, divorced; remarried  '84. 1 son - '85; 1 daughter '89. 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    Social Determinants of Health   Financial Resource Strain:   . Difficulty of Paying Living Expenses:   Food Insecurity:   . Worried About Charity fundraiser in the Last Year:   . Arboriculturist in the Last Year:   Transportation Needs:   . Film/video editor  (Medical):   Marland Kitchen Lack of Transportation (Non-Medical):   Physical Activity:   . Days of Exercise per Week:   . Minutes of Exercise per Session:   Stress:   . Feeling of Stress :   Social Connections:   . Frequency of Communication with Friends and Family:   . Frequency of Social Gatherings with Friends and Family:   . Attends Religious Services:   . Active Member of Clubs or Organizations:   . Attends Archivist Meetings:   Marland Kitchen Marital Status:     Outpatient Encounter Medications as of 07/25/2019  Medication Sig  . aspirin 81 MG tablet Take 81 mg by mouth daily.  . candesartan (ATACAND) 16 MG tablet Take 1 tablet (16 mg total) by mouth daily.  . carvedilol (COREG) 6.25 MG tablet TAKE 1 TABLET (6.25 MG TOTAL) BY MOUTH 2 (TWO) TIMES DAILY WITH A MEAL.  Marland Kitchen gabapentin (NEURONTIN) 300 MG capsule Take 1 capsule (300 mg total) by mouth at bedtime.  Marland Kitchen levothyroxine (SYNTHROID) 100 MCG tablet TAKE 1 TABLET BY MOUTH EVERY DAY  . Magnesium Oxide 250 MG TABS TAKE 1 TABLET BY MOUTH TWICE A DAY  . omeprazole (PRILOSEC) 40 MG capsule Take 1 capsule (40 mg total) by mouth daily.  . rosuvastatin (CRESTOR) 20 MG tablet TAKE 1 TABLET BY MOUTH EVERY DAY  . thiamine 100 MG tablet TAKE 1/2 TABLET DAILY  . zolpidem (AMBIEN) 10 MG tablet TAKE 1 TABLET AT BEDTIME ASNEEDED  . [DISCONTINUED] carvedilol (COREG) 6.25 MG tablet Take 1 tablet (6.25 mg total) by mouth 2 (two) times daily with a meal.  . [DISCONTINUED] levothyroxine (SYNTHROID) 100 MCG tablet Take 1 tablet (100 mcg total) by mouth daily.   No facility-administered encounter medications on file as of 07/25/2019.    Activities of Daily Living In your present state of health, do you have any difficulty performing the following activities: 07/25/2019  Hearing? N  Vision? N  Difficulty concentrating or making decisions? N  Walking or climbing stairs? N  Dressing or bathing? N  Doing errands, shopping? N  Preparing Food and eating ? N  Using  the Toilet? N  In the past six months, have you accidently leaked urine? N  Do you have problems with loss of bowel control? N  Managing your Medications? N  Managing your Finances? N  Housekeeping or managing your Housekeeping? N  Some recent data might be hidden  Patient Care Team: [name], MD as PCP - General (Internal Medicine)   Assessment:   This is a routine wellness examination for [facility].  Exercise Activities and Dietary recommendations Current Exercise Habits: The patient has a physically strenuous job, but has no regular exercise apart from work., Exercise limited by: None identified  Goals    . Client understands the importance of follow-up with providers by attending scheduled visits    . Patient Stated     Continue to stay healthy.       Fall Risk Fall Risk  07/25/2019 04/26/2018 04/24/2018 04/25/2017 04/25/2017  Falls in the past year? 0 0 0 No No  Number falls in past yr: 0 - 0 - -  Injury with Fall? 0 - 0 - -  Risk for fall due to : No Fall Risks - - - -  Follow up Falls evaluation completed;Education provided;Falls prevention discussed - Falls evaluation completed - -   Is the patient's home free of loose throw rugs in walkways, pet beds, electrical cords, etc?   yes      Grab bars in the bathroom? no      Handrails on the stairs?   yes      Adequate lighting?   yes   Depression Screen PHQ 2/9 Scores 07/25/2019 04/26/2018 04/24/2018 04/25/2017  PHQ - 2 Score 0 0 0 0    Cognitive Function     6CIT Screen 07/25/2019  What Year? 0 points  What month? 0 points  What time? 0 points  Count back from 20 0 points  Months in reverse 0 points  Repeat phrase 0 points  Total Score 0    Immunization History  Administered Date(s) Administered  . Influenza Whole 04/13/2009, 01/09/2010, 02/16/2012  . Influenza, High Dose Seasonal PF 01/04/2019  . Influenza,inj,Quad PF,6+ Mos 12/30/2014  . Influenza-Unspecified 01/27/2014, 01/18/2018  . Pneumococcal  Conjugate-13 01/19/2016  . Pneumococcal Polysaccharide-23 07/30/2013  . Td 04/11/1990, 04/29/2010  . Tdap 03/27/2015     Screening Tests Health Maintenance  Topic Date Due  . PNA vac Low Risk Adult (2 of 2 - PPSV23) 01/06/2020 (Originally 07/31/2018)  . INFLUENZA VACCINE  11/09/2019  . COLONOSCOPY  11/10/2024  . TETANUS/TDAP  03/26/2025  . Hepatitis C Screening  Completed   Cancer Screenings: Lung: Low Dose CT Chest recommended if Age 25-80 years, 30 pack-year currently smoking OR have quit w/in 15years. Patient does not qualify. Colorectal: Yes      Plan:     Reviewed health maintenance screenings with patient today and relevant education, vaccines, and/or referrals were provided.    Continue doing brain stimulating activities (puzzles, reading, adult coloring books, staying active) to keep memory sharp.    Continue to eat heart healthy diet (full of fruits, vegetables, whole grains, lean protein, water--limit salt, fat, and sugar intake) and increase physical activity as tolerated.  I have personally reviewed and noted the following in the patient's chart:   . Medical and social history . Use of alcohol, tobacco or illicit drugs  . Current medications and supplements . Functional ability and status . Nutritional status . Physical activity . Advanced directives . List of other physicians . Hospitalizations, surgeries, and ER visits in previous 12 months . Vitals . Screenings to include cognitive, depression, and falls . Referrals and appointments  In addition, I have reviewed and discussed with patient certain preventive protocols, quality metrics, and best practice recommendations. A written personalized care plan for preventive services as well as  general preventive health recommendations were provided to patient.     Sheral Flow, LPN  [id number]  Nurse Health Advisor

## 2019-07-25 NOTE — Patient Instructions (Addendum)
Brandon [name] , Thank you for taking time to come for your Medicare Wellness Visit. I appreciate your ongoing commitment to your health goals. Please review the following plan we discussed and let me know if I can assist you in the future.   Screening recommendations/referrals: Colorectal Screening: 11/11/2014  Vision and Dental Exams: Recommended annual ophthalmology exams for early detection of glaucoma and other disorders of the eye Recommended annual dental exams for proper oral hygiene  Vaccinations: Influenza vaccine: 01/04/2019 Pneumococcal vaccine: completed; Pneumovax 07/30/2013, Prevnar 01/19/2016 Tdap vaccine: 03/27/2015; due every 10 years Shingles vaccine: Please call your insurance company to determine your out of pocket expense for the Shingrix vaccine. You may receive this vaccine at your local pharmacy.  Advanced directives: Advance directives discussed with you today. Please bring a copy of your POA (Power of [place]) and/or Living Will to your next appointment.  Goals:  Recommend to drink at least 6-8 8oz glasses of water per day.  Recommend to exercise for at least 150 minutes per week.  Recommend to remove any items from the home that may cause slips or trips.  Recommend to decrease portion sizes by eating 3 small healthy meals and at least 2 healthy snacks per day.  Recommend to begin DASH diet as directed below.  Recommend to continue efforts to reduce smoking habits until no longer smoking. Smoking Cessation literature is attached below.  Next appointment: Please schedule your Annual Wellness Visit with your Nurse Health Advisor in one year.  Preventive Care 37 Years and Older, Male Preventive care refers to lifestyle choices and visits with your health care provider that can promote health and wellness. What does preventive care include?  A yearly physical exam. This is also called an annual well check.  Dental exams once or twice a year.  Routine eye exams.  Ask your health care provider how often you should have your eyes checked.  Personal lifestyle choices, including:  Daily care of your teeth and gums.  Regular physical activity.  Eating a healthy diet.  Avoiding tobacco and drug use.  Limiting alcohol use.  Practicing safe sex.  Taking low doses of aspirin every day if recommended by your health care provider..  Taking vitamin and mineral supplements as recommended by your health care provider. What happens during an annual well check? The services and screenings done by your health care provider during your annual well check will depend on your age, overall health, lifestyle risk factors, and family history of disease. Counseling  Your health care provider may ask you questions about your:  Alcohol use.  Tobacco use.  Drug use.  Emotional well-being.  Home and relationship well-being.  Sexual activity.  Eating habits.  History of falls.  Memory and ability to understand (cognition).  Work and work Statistician. Screening  You may have the following tests or measurements:  Height, weight, and BMI.  Blood pressure.  Lipid and cholesterol levels. These may be checked every 5 years, or more frequently if you are over 79 years old.  Skin check.  Lung cancer screening. You may have this screening every year starting at age 72 if you have a 30-pack-year history of smoking and currently smoke or have quit within the past 15 years.  Fecal occult blood test (FOBT) of the stool. You may have this test every year starting at age 49.  Flexible sigmoidoscopy or colonoscopy. You may have a sigmoidoscopy every 5 years or a colonoscopy every 10 years starting at age 82.  Prostate cancer screening. Recommendations will vary depending on your family history and other risks.  Hepatitis C blood test.  Hepatitis B blood test.  Sexually transmitted disease (STD) testing.  Diabetes screening. This is done by checking your  blood sugar (glucose) after you have not eaten for a while (fasting). You may have this done every 1-3 years.  Abdominal aortic aneurysm (AAA) screening. You may need this if you are a current or former smoker.  Osteoporosis. You may be screened starting at age 9 if you are at high risk. Talk with your health care provider about your test results, treatment options, and if necessary, the need for more tests. Vaccines  Your health care provider may recommend certain vaccines, such as:  Influenza vaccine. This is recommended every year.  Tetanus, diphtheria, and acellular pertussis (Tdap, Td) vaccine. You may need a Td booster every 10 years.  Zoster vaccine. You may need this after age 63.  Pneumococcal 13-valent conjugate (PCV13) vaccine. One dose is recommended after age 56.  Pneumococcal polysaccharide (PPSV23) vaccine. One dose is recommended after age 59. Talk to your health care provider about which screenings and vaccines you need and how often you need them. This information is not intended to replace advice given to you by your health care provider. Make sure you discuss any questions you have with your health care provider. Document Released: 04/23/2015 Document Revised: 12/15/2015 Document Reviewed: 01/26/2015 Elsevier Interactive Patient Education  2017 [place] Prevention in the Home Falls can cause injuries. They can happen to people of all ages. There are many things you can do to make your home safe and to help prevent falls. What can I do on the outside of my home?  Regularly fix the edges of walkways and driveways and fix any cracks.  Remove anything that might make you trip as you walk through a door, such as a raised step or threshold.  Trim any bushes or trees on the path to your home.  Use bright outdoor lighting.  Clear any walking paths of anything that might make someone trip, such as rocks or tools.  Regularly check to see if handrails are  loose or broken. Make sure that both sides of any steps have handrails.  Any raised decks and porches should have guardrails on the edges.  Have any leaves, snow, or ice cleared regularly.  Use sand or salt on walking paths during winter.  Clean up any spills in your garage right away. This includes oil or grease spills. What can I do in the bathroom?  Use night lights.  Install grab bars by the toilet and in the tub and shower. Do not use towel bars as grab bars.  Use non-skid mats or decals in the tub or shower.  If you need to sit down in the shower, use a plastic, non-slip stool.  Keep the floor dry. Clean up any water that spills on the floor as soon as it happens.  Remove soap buildup in the tub or shower regularly.  Attach bath mats securely with double-sided non-slip rug tape.  Do not have throw rugs and other things on the floor that can make you trip. What can I do in the bedroom?  Use night lights.  Make sure that you have a light by your bed that is easy to reach.  Do not use any sheets or blankets that are too big for your bed. They should not hang down onto the floor.  Have  a firm chair that has side arms. You can use this for support while you get dressed.  Do not have throw rugs and other things on the floor that can make you trip. What can I do in the kitchen?  Clean up any spills right away.  Avoid walking on wet floors.  Keep items that you use a lot in easy-to-reach places.  If you need to reach something above you, use a strong step stool that has a grab bar.  Keep electrical cords out of the way.  Do not use floor polish or wax that makes floors slippery. If you must use wax, use non-skid floor wax.  Do not have throw rugs and other things on the floor that can make you trip. What can I do with my stairs?  Do not leave any items on the stairs.  Make sure that there are handrails on both sides of the stairs and use them. Fix handrails that  are broken or loose. Make sure that handrails are as long as the stairways.  Check any carpeting to make sure that it is firmly attached to the stairs. Fix any carpet that is loose or worn.  Avoid having throw rugs at the top or bottom of the stairs. If you do have throw rugs, attach them to the floor with carpet tape.  Make sure that you have a light switch at the top of the stairs and the bottom of the stairs. If you do not have them, ask someone to add them for you. What else can I do to help prevent falls?  Wear shoes that:  Do not have high heels.  Have rubber bottoms.  Are comfortable and fit you well.  Are closed at the toe. Do not wear sandals.  If you use a stepladder:  Make sure that it is fully opened. Do not climb a closed stepladder.  Make sure that both sides of the stepladder are locked into place.  Ask someone to hold it for you, if possible.  Clearly mark and make sure that you can see:  Any grab bars or handrails.  First and last steps.  Where the edge of each step is.  Use tools that help you move around (mobility aids) if they are needed. These include:  Canes.  Walkers.  Scooters.  Crutches.  Turn on the lights when you go into a dark area. Replace any light bulbs as soon as they burn out.  Set up your furniture so you have a clear path. Avoid moving your furniture around.  If any of your floors are uneven, fix them.  If there are any pets around you, be aware of where they are.  Review your medicines with your doctor. Some medicines can make you feel dizzy. This can increase your chance of falling. Ask your doctor what other things that you can do to help prevent falls. This information is not intended to replace advice given to you by your health care provider. Make sure you discuss any questions you have with your health care provider. Document Released: 01/21/2009 Document Revised: 09/02/2015 Document Reviewed: 05/01/2014 Elsevier  Interactive Patient Education  2017 [employer].

## 2019-08-08 ENCOUNTER — Other Ambulatory Visit: Payer: Self-pay | Admitting: Family Medicine

## 2019-09-13 ENCOUNTER — Ambulatory Visit: Admitting: Neurology

## 2019-09-13 MED ORDER — Dilantin: 100 | 450 | 3 refills | 0 days | Status: AC

## 2019-09-14 DIAGNOSIS — Z20822 Contact with and (suspected) exposure to covid-19: Secondary | ICD-10-CM | POA: Diagnosis not present

## 2019-09-16 ENCOUNTER — Encounter: Payer: Self-pay | Admitting: Internal Medicine

## 2019-09-17 ENCOUNTER — Other Ambulatory Visit: Payer: Self-pay | Admitting: Internal Medicine

## 2019-09-17 ENCOUNTER — Encounter: Payer: Self-pay | Admitting: Internal Medicine

## 2019-09-17 DIAGNOSIS — F5101 Primary insomnia: Secondary | ICD-10-CM

## 2019-09-17 DIAGNOSIS — I10 Essential (primary) hypertension: Secondary | ICD-10-CM

## 2019-09-17 MED ORDER — ZOLPIDEM TARTRATE 10 MG PO TABS: ORAL_TABLET | ORAL | 0 refills | Status: DC

## 2019-09-17 MED ORDER — CANDESARTAN CILEXETIL 16 MG PO TABS: 16.0000 mg | ORAL_TABLET | Freq: Every day | ORAL | 0 refills | Status: DC

## 2019-09-18 ENCOUNTER — Other Ambulatory Visit: Payer: Self-pay | Admitting: Internal Medicine

## 2019-09-18 DIAGNOSIS — K219 Gastro-esophageal reflux disease without esophagitis: Secondary | ICD-10-CM

## 2019-10-03 DIAGNOSIS — Z008 Encounter for other general examination: Secondary | ICD-10-CM | POA: Diagnosis not present

## 2019-10-03 DIAGNOSIS — E785 Hyperlipidemia, unspecified: Secondary | ICD-10-CM | POA: Diagnosis not present

## 2019-10-03 DIAGNOSIS — K219 Gastro-esophageal reflux disease without esophagitis: Secondary | ICD-10-CM | POA: Diagnosis not present

## 2019-10-03 DIAGNOSIS — M1990 Unspecified osteoarthritis, unspecified site: Secondary | ICD-10-CM | POA: Diagnosis not present

## 2019-10-03 DIAGNOSIS — Z6827 Body mass index (BMI) 27.0-27.9, adult: Secondary | ICD-10-CM | POA: Diagnosis not present

## 2019-10-03 DIAGNOSIS — I10 Essential (primary) hypertension: Secondary | ICD-10-CM | POA: Diagnosis not present

## 2019-10-03 DIAGNOSIS — G4700 Insomnia, unspecified: Secondary | ICD-10-CM | POA: Diagnosis not present

## 2019-10-03 DIAGNOSIS — E039 Hypothyroidism, unspecified: Secondary | ICD-10-CM | POA: Diagnosis not present

## 2019-10-03 DIAGNOSIS — G8929 Other chronic pain: Secondary | ICD-10-CM | POA: Diagnosis not present

## 2019-10-03 DIAGNOSIS — E663 Overweight: Secondary | ICD-10-CM | POA: Diagnosis not present

## 2019-10-03 DIAGNOSIS — R609 Edema, unspecified: Secondary | ICD-10-CM | POA: Diagnosis not present

## 2019-10-09 ENCOUNTER — Other Ambulatory Visit: Payer: Self-pay | Admitting: Internal Medicine

## 2019-10-09 DIAGNOSIS — I10 Essential (primary) hypertension: Secondary | ICD-10-CM

## 2019-10-15 ENCOUNTER — Other Ambulatory Visit: Payer: Self-pay | Admitting: Internal Medicine

## 2019-10-15 DIAGNOSIS — I712 Thoracic aortic aneurysm, without rupture, unspecified: Secondary | ICD-10-CM

## 2019-10-15 DIAGNOSIS — I10 Essential (primary) hypertension: Secondary | ICD-10-CM

## 2019-10-16 ENCOUNTER — Other Ambulatory Visit: Payer: Self-pay | Admitting: Internal Medicine

## 2019-10-16 DIAGNOSIS — E039 Hypothyroidism, unspecified: Secondary | ICD-10-CM

## 2019-10-19 ENCOUNTER — Other Ambulatory Visit: Payer: Self-pay | Admitting: Internal Medicine

## 2019-10-19 DIAGNOSIS — E785 Hyperlipidemia, unspecified: Secondary | ICD-10-CM

## 2019-10-19 DIAGNOSIS — I712 Thoracic aortic aneurysm, without rupture, unspecified: Secondary | ICD-10-CM

## 2019-10-20 ENCOUNTER — Ambulatory Visit

## 2019-10-20 ENCOUNTER — Ambulatory Visit: Payer: Medicare HMO | Admitting: Internal Medicine

## 2019-10-21 ENCOUNTER — Ambulatory Visit: Admitting: Neurology

## 2019-10-21 NOTE — Progress Notes (Signed)
* * *      [name] [name], [name] [name] **DOB:** [birth year] [phone] yo M) **Acc No.** [id number] **DOS:**  10/21/2019    ---       **[name] [name], [name] [name]**    ------    20 Y old Male, DOB: March 16, 1951    [address], [place], [place] [address]    Home: [phone]    Provider: [name]        * * *    Telephone Encounter    ---    Answered by  [name] Date: 10/21/2019       Time: 09:49 AM    Reason  dilantin 23.1    ------            Action Taken                     [name]  10/21/2019 9:49:44 AM > pls let him know from yesterday ? repeat in 3 weeks vs decrease by 30- levels are so labile/ question has dilantin become unmanageable/will we need to switch is what am wondering no easy soluotion as would need to switch drugs/risk seizures- feel free to triage/discuss with him [name]  10/22/2019 10:56:41 AM > last seen in August 2020 was due for follow up in Feb 2021. Reviewed the above message with patient. Patient is doing well. No seizures, no symptoms of dilantin toxicity at this time.  Patient would prefer to stay where he is at for dilantin dose, get another blood draw in 3 weeks.  Will have [name] call patient to schedule follow up.        [facility]  10/22/2019 11:03:25 AM > forwarded to Dr. [name].      [name]  10/22/2019 11:39:13 AM > pls book bloodwork 3 weeks thx J O/ Dilantin level - might have standing order already thx [name]  10/22/2019 12:52:08 PM > pt states he does not need anything sent to [facility] in [place], he just walks in and they draw his blood so it seems he does have a standing order. He said he will get it done in 3 weeks. Scheduled his f/u for 10/6 at 1300, thanks!                    * * *                ---          * * *         Provider: [name], Ethie Curless 10/21/2019    ---    Note generated by eClinicalWorks EMR/PM Software ([email or web address])

## 2019-10-22 ENCOUNTER — Ambulatory Visit: Admitting: Neurology

## 2019-10-22 ENCOUNTER — Other Ambulatory Visit: Payer: Self-pay

## 2019-10-22 ENCOUNTER — Encounter: Payer: Self-pay | Admitting: Internal Medicine

## 2019-10-22 ENCOUNTER — Ambulatory Visit (INDEPENDENT_AMBULATORY_CARE_PROVIDER_SITE_OTHER): Payer: Medicare HMO | Admitting: Internal Medicine

## 2019-10-22 VITALS — BP 168/94 | HR 61 | Temp 98.0°F | Resp 16 | Ht 70.0 in | Wt 182.2 lb

## 2019-10-22 DIAGNOSIS — Z0000 Encounter for general adult medical examination without abnormal findings: Secondary | ICD-10-CM

## 2019-10-22 DIAGNOSIS — Z77011 Contact with and (suspected) exposure to lead: Secondary | ICD-10-CM

## 2019-10-22 DIAGNOSIS — Z23 Encounter for immunization: Secondary | ICD-10-CM

## 2019-10-22 DIAGNOSIS — K921 Melena: Secondary | ICD-10-CM

## 2019-10-22 DIAGNOSIS — N400 Enlarged prostate without lower urinary tract symptoms: Secondary | ICD-10-CM | POA: Diagnosis not present

## 2019-10-22 DIAGNOSIS — I10 Essential (primary) hypertension: Secondary | ICD-10-CM | POA: Diagnosis not present

## 2019-10-22 DIAGNOSIS — I712 Thoracic aortic aneurysm, without rupture, unspecified: Secondary | ICD-10-CM

## 2019-10-22 DIAGNOSIS — E519 Thiamine deficiency, unspecified: Secondary | ICD-10-CM | POA: Diagnosis not present

## 2019-10-22 DIAGNOSIS — E039 Hypothyroidism, unspecified: Secondary | ICD-10-CM | POA: Diagnosis not present

## 2019-10-22 DIAGNOSIS — E785 Hyperlipidemia, unspecified: Secondary | ICD-10-CM

## 2019-10-22 NOTE — Patient Instructions (Signed)

## 2019-10-22 NOTE — Progress Notes (Signed)
Subjective:  Patient ID: Brandon [name], male    DOB: 05/15/1950  Age: 69 y.o. MRN: [id number]  CC: Annual Exam and Hypertension  This visit occurred during the SARS-CoV-2 public health emergency.  Safety protocols were in place, including screening questions prior to the visit, additional usage of staff PPE, and extensive cleaning of exam room while observing appropriate contact time as indicated for disinfecting solutions.    HPI Brandon [name] presents for a CPX.  He reports that his blood pressure at home is usually around 140/90.  He is active and denies any recent episodes of chest pain, shortness of breath, palpitations, edema, or fatigue.  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  Outpatient Medications Prior to Visit  Medication Sig Dispense Refill  . aspirin 81 MG tablet Take 81 mg by mouth daily.    . carvedilol (COREG) 6.25 MG tablet TAKE 1 TABLET BY MOUTH 2 TIMES DAILY WITH A MEAL 180 tablet 0  . gabapentin (NEURONTIN) 300 MG capsule Take 1 capsule (300 mg total) by mouth at bedtime. 90 capsule 3  . Magnesium Oxide 250 MG TABS TAKE 1 TABLET BY MOUTH TWICE A DAY 180 tablet 1  . omeprazole (PRILOSEC) 40 MG capsule TAKE 1 CAPSULE BY MOUTH EVERY DAY 90 capsule 1  . thiamine 100 MG tablet TAKE 1/2 TABLET DAILY 45 tablet 1  . zolpidem (AMBIEN) 10 MG tablet TAKE 1 TABLET AT BEDTIME ASNEEDED 30 tablet 0  . candesartan (ATACAND) 16 MG tablet TAKE 1 TABLET BY MOUTH EVERY DAY 30 tablet 0  . levothyroxine (SYNTHROID) 100 MCG tablet TAKE 1 TABLET BY MOUTH EVERY DAY 30 tablet 0  . rosuvastatin (CRESTOR) 20 MG tablet TAKE 1 TABLET BY MOUTH EVERY DAY 30 tablet 0  . DUEXIS 800-26.6 MG TABS TAKE ONE TABLET BY MOUTH THREE TIMES DAILY 90 tablet 0   No facility-administered medications prior to visit.    ROS Review of Systems  Constitutional: Negative for appetite change, diaphoresis, fatigue and unexpected weight change.  HENT:  Negative.   Eyes: Negative for visual disturbance.  Respiratory: Negative for cough, chest tightness, shortness of breath and wheezing.   Cardiovascular: Negative for chest pain, palpitations and leg swelling.  Gastrointestinal: Positive for blood in stool. Negative for abdominal pain, anal bleeding, constipation, diarrhea, nausea and vomiting.  Endocrine: Negative.  Negative for cold intolerance and heat intolerance.  Genitourinary: Negative.  Negative for difficulty urinating, discharge, dysuria, scrotal swelling and testicular pain.  Musculoskeletal: Negative.  Negative for arthralgias and myalgias.  Skin: Negative.  Negative for color change.  Neurological: Negative.  Negative for dizziness, weakness, light-headedness and numbness.  Hematological: Negative for adenopathy. Does not bruise/bleed easily.  Psychiatric/Behavioral: Negative.  Negative for agitation, hallucinations and suicidal ideas. The patient is not nervous/anxious.     Objective:  BP (!) 168/94 (BP Location: Left Arm, Patient Position: Sitting, Cuff Size: Normal)   Pulse 61   Temp 98 F (36.7 C) (Oral)   Resp 16   Ht 5\' 10"  (1.778 m)   Wt 182 lb 3 oz (82.6 kg)   SpO2 99%   BMI 26.14 kg/m   BP Readings from Last 3 Encounters:  10/22/19 (!) 168/94  07/25/19 140/80  03/26/19 132/84    Wt Readings from Last 3 Encounters:  10/22/19 182 lb 3 oz (82.6 kg)  07/25/19 186 lb (84.4 kg)  03/26/19 184 lb 4 oz (83.6 kg)    Physical  Exam Vitals reviewed. Exam conducted with a chaperone present Everette Rank).  Constitutional:      Appearance: Normal appearance.  HENT:     Nose: Nose normal.     Mouth/Throat:     Mouth: Mucous membranes are moist.  Eyes:     General: No scleral icterus.    Conjunctiva/sclera: Conjunctivae normal.  Cardiovascular:     Rate and Rhythm: Regular rhythm. Bradycardia present.     Chest Wall: PMI is not displaced.     Heart sounds: No murmur heard.  No gallop.      Comments:  EKG- Sinus bradycardia with first-degree AV block, 56 bpm. Otherwise normal EKG. Pulmonary:     Effort: Pulmonary effort is normal.     Breath sounds: No stridor. No wheezing, rhonchi or rales.  Abdominal:     General: Abdomen is flat. Bowel sounds are normal. There is no distension.     Palpations: Abdomen is soft. There is no hepatomegaly, splenomegaly or mass.     Tenderness: There is no abdominal tenderness.     Hernia: No hernia is present. There is no hernia in the left inguinal area or right inguinal area.  Genitourinary:    Pubic Area: No rash.      Penis: Normal and uncircumcised. No phimosis, paraphimosis, hypospadias, erythema, tenderness, discharge, swelling or lesions.      Testes: Normal.        Right: Mass, tenderness or swelling not present.        Left: Mass, tenderness or swelling not present.     Epididymis:     Right: Normal. Not inflamed or enlarged. No mass.     Left: Normal. Not inflamed or enlarged. No mass.     Prostate: Enlarged (1+ smooth symm BPH). Not tender and no nodules present.     Rectum: Guaiac result positive. Internal hemorrhoid present. No tenderness, anal fissure or external hemorrhoid. Normal anal tone.  Musculoskeletal:        General: No tenderness. Normal range of motion.     Cervical back: Neck supple.     Right lower leg: No edema.     Left lower leg: No edema.  Lymphadenopathy:     Cervical: No cervical adenopathy.     Lower Body: No right inguinal adenopathy. No left inguinal adenopathy.  Skin:    General: Skin is warm and dry.     Coloration: Skin is not pale.  Neurological:     General: No focal deficit present.     Mental Status: He is alert and oriented to person, place, and time. Mental status is at baseline.  Psychiatric:        Behavior: Behavior normal.        Thought Content: Thought content normal.        Judgment: Judgment normal.     Lab Results  Component Value Date   WBC 6.6 10/22/2019   HGB 14.6 10/22/2019    HCT 44.1 10/22/2019   PLT 331 10/22/2019   GLUCOSE 101 (H) 10/22/2019   CHOL 162 10/22/2019   TRIG 95 10/22/2019   HDL 74 10/22/2019   LDLDIRECT 147.7 12/03/2007   LDLCALC 70 10/22/2019   ALT 20 10/22/2019   AST 25 10/22/2019   NA 140 10/22/2019   K 4.3 10/22/2019   CL 106 10/22/2019   CREATININE 1.24 10/22/2019   BUN 36 (H) 10/22/2019   CO2 23 10/22/2019   TSH 1.87 10/22/2019   PSA 1.2 10/22/2019   INR 1.1 (  H) 04/24/2018   HGBA1C 5.5 02/20/2017    DG Cervical Spine Complete  Result Date: 09/18/2018 CLINICAL DATA:  Chronic cervicalgia EXAM: CERVICAL SPINE - COMPLETE 4+ VIEW COMPARISON:  CT neck with bony reformats May 22, 2011 FINDINGS: Frontal, lateral, open-mouth odontoid, and bilateral oblique views were obtained. There is no fracture. There is 3 mm of anterolisthesis of C3 on C4. No other spondylolisthesis is evident. Prevertebral soft tissues and predental space regions are normal. There is partial ankylosis at C4-5. There is severe disc space narrowing at C5-6 and C6-7. There is facet hypertrophy with exit foraminal narrowing at all levels with the exception of C2-3 bilaterally. No erosive changes evident. Lung apices are clear. There is reversal of lordotic curvature. Lung apices are clear. IMPRESSION: Extensive arthropathy, most marked at C4-5. Mild spondylolisthesis at C3-4 is felt to be due to underlying spondylosis. No other spondylolisthesis evident. No fracture. Reversal of lordotic curvature is likely due to chronic muscle spasm. Electronically Signed   By: Lowella Grip III M.D.   On: 09/18/2018 14:19    Assessment & Plan:   Brandon [name] was seen today for annual exam and hypertension.  Diagnoses and all orders for this visit:  Essential hypertension, benign- His blood pressure is not adequately well controlled.  His labs are negative for secondary causes or endorgan damage.  I recommended that he increase the dose of the ARB. -     CBC with Differential/Platelet;  Future -     Basic metabolic panel; Future -     Urinalysis, Routine w reflex microscopic; Future -     Pneumococcal polysaccharide vaccine 23-valent greater than or equal to 2yo subcutaneous/IM -     Urinalysis, Routine w reflex microscopic -     Basic metabolic panel -     CBC with Differential/Platelet -     candesartan (ATACAND) 32 MG tablet; Take 1 tablet (32 mg total) by mouth daily.  Benign prostatic hyperplasia without lower urinary tract symptoms- His PSA level is normal which is a reassuring sign that he does not have prostate cancer.  He has no symptoms that need to be treated. -     PSA; Future -     PSA  Hypomagnesemia- His magnesium level is normal now. -     Magnesium; Future -     Magnesium  Lead exposure- His lead level is down to 15.  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 -     Lead, blood (adult age 57 yrs or greater); Future -     Lead, blood (adult age 59 yrs or greater)  Routine general medical examination at a health care facility- Exam completed, labs reviewed, vaccines reviewed and updated, patient education was given.  Acquired hypothyroidism- His TSH is in the normal range.  He will remain on the current dose of levothyroxine. -     TSH; Future -     TSH -     levothyroxine (SYNTHROID) 100 MCG tablet; Take 1 tablet (100 mcg total) by mouth daily.  Hyperlipidemia with target LDL less than 130- He has achieved his LDL goal and is doing well on the well on the statin. -     Lipid panel; Future -     Hepatic function panel; Future -     Hepatic function panel -     Lipid panel -     rosuvastatin (CRESTOR) 20 MG tablet; Take 1 tablet (20 mg total) by mouth daily.  Thiamine deficiency-  His thiamine level is normal now.  Will continue the current thiamine supplement. -     CBC with Differential/Platelet; Future -     Vitamin B1; Future -     Vitamin B1 -     CBC with Differential/Platelet  Thoracic aortic aneurysm without rupture [facility])- He will  be due for follow-up on this in December of this year. -     rosuvastatin (CRESTOR) 20 MG tablet; Take 1 tablet (20 mg total) by mouth daily.  Symptom of blood in stool -     Ambulatory referral to Gastroenterology   I have discontinued Brandon Griffes. Rials "Skip"'s Duexis and candesartan. I have also changed his levothyroxine and rosuvastatin. Additionally, I am having him start on candesartan. Lastly, I am having him maintain his aspirin, Magnesium Oxide, gabapentin, thiamine, zolpidem, omeprazole, and carvedilol.  Meds ordered this encounter  Medications  . levothyroxine (SYNTHROID) 100 MCG tablet    Sig: Take 1 tablet (100 mcg total) by mouth daily.    Dispense:  90 tablet    Refill:  1  . candesartan (ATACAND) 32 MG tablet    Sig: Take 1 tablet (32 mg total) by mouth daily.    Dispense:  90 tablet    Refill:  1  . rosuvastatin (CRESTOR) 20 MG tablet    Sig: Take 1 tablet (20 mg total) by mouth daily.    Dispense:  90 tablet    Refill:  1   In addition to time spent on CPE, I spent 60 minutes in preparing to see the patient by review of recent labs, imaging and procedures, obtaining and reviewing separately obtained history, communicating with the patient and family or caregiver, ordering medications, tests or procedures, and documenting clinical information in the EHR including the differential Dx, treatment, and any further evaluation and other management of 1. Essential hypertension, benign 2. Benign prostatic hyperplasia without lower urinary tract symptoms 3. Hypomagnesemia 4. Lead exposure 5. Acquired hypothyroidism 6. Hyperlipidemia with target LDL less than 130 7. Thiamine deficiency 8. Thoracic aortic aneurysm without rupture ([place]) 9. Symptom of blood in stool     Follow-up: Return in about 6 months (around 04/23/2020).  Scarlette Calico, MD

## 2019-10-22 NOTE — Progress Notes (Signed)
* * *      Bushong, TAMEL ABEL **DOB:** [birth year] [phone] yo M) **Acc No.** [id number] **DOS:**  10/22/2019    ---       **Dehaas, [name]**    ------    56 Y old Male, DOB: 02-Oct-1950    [address], [place], [place] [address]    Home: [phone]    Provider: [name]        * * *    Telephone Encounter    ---    Answered by  Charyl Dancer Date: 10/22/2019       Time: 11:02 AM    Reason  follow up appt    ------            Message                     please call patient to schedule follow up tx.                 Action Taken                     Curran,[name]  10/23/2019 1:41:31 PM > f/u already scheduled for 10/6 at 1300, thanks!                    * * *                ---          * * *         Provider: [name], Betul Brisky 10/22/2019    ---    Note generated by eClinicalWorks EMR/PM Software ([email or web address])

## 2019-10-27 LAB — CBC WITH DIFFERENTIAL/PLATELET
Absolute Monocytes: 620 cells/uL (ref 200–950)
Basophils Absolute: 40 cells/uL (ref 0–200)
Basophils Relative: 0.6 %
Eosinophils Absolute: 112 cells/uL (ref 15–500)
Eosinophils Relative: 1.7 %
HCT: 44.1 % (ref 38.5–50.0)
Hemoglobin: 14.6 g/dL (ref 13.2–17.1)
Lymphs Abs: 1168 cells/uL (ref 850–3900)
MCH: 30.4 pg (ref 27.0–33.0)
MCHC: 33.1 g/dL (ref 32.0–36.0)
MCV: 91.7 fL (ref 80.0–100.0)
MPV: 11 fL (ref 7.5–12.5)
Monocytes Relative: 9.4 %
Neutro Abs: 4660 cells/uL (ref 1500–7800)
Neutrophils Relative %: 70.6 %
Platelets: 331 10*3/uL (ref 140–400)
RBC: 4.81 10*6/uL (ref 4.20–5.80)
RDW: 14.5 % (ref 11.0–15.0)
Total Lymphocyte: 17.7 %
WBC: 6.6 10*3/uL (ref 3.8–10.8)

## 2019-10-27 LAB — URINALYSIS, ROUTINE W REFLEX MICROSCOPIC
Bilirubin Urine: NEGATIVE
Glucose, UA: NEGATIVE
Hgb urine dipstick: NEGATIVE
Leukocytes,Ua: NEGATIVE
Nitrite: NEGATIVE
Protein, ur: NEGATIVE
Specific Gravity, Urine: 1.027 (ref 1.001–1.03)
pH: <=5 (ref 5.0–8.0)

## 2019-10-27 LAB — HEPATIC FUNCTION PANEL
AG Ratio: 1.7 (calc) (ref 1.0–2.5)
ALT: 20 U/L (ref 9–46)
AST: 25 U/L (ref 10–35)
Albumin: 4.2 g/dL (ref 3.6–5.1)
Alkaline phosphatase (APISO): 75 U/L (ref 35–144)
Bilirubin, Direct: 0.1 mg/dL (ref 0.0–0.2)
Globulin: 2.5 g/dL (calc) (ref 1.9–3.7)
Indirect Bilirubin: 0.4 mg/dL (calc) (ref 0.2–1.2)
Total Bilirubin: 0.5 mg/dL (ref 0.2–1.2)
Total Protein: 6.7 g/dL (ref 6.1–8.1)

## 2019-10-27 LAB — MAGNESIUM: Magnesium: 1.5 mg/dL (ref 1.5–2.5)

## 2019-10-27 LAB — BASIC METABOLIC PANEL
BUN/Creatinine Ratio: 29 (calc) — ABNORMAL HIGH (ref 6–22)
BUN: 36 mg/dL — ABNORMAL HIGH (ref 7–25)
CO2: 23 mmol/L (ref 20–32)
Calcium: 9.2 mg/dL (ref 8.6–10.3)
Chloride: 106 mmol/L (ref 98–110)
Creat: 1.24 mg/dL (ref 0.70–1.25)
Glucose, Bld: 101 mg/dL — ABNORMAL HIGH (ref 65–99)
Potassium: 4.3 mmol/L (ref 3.5–5.3)
Sodium: 140 mmol/L (ref 135–146)

## 2019-10-27 LAB — PSA: PSA: 1.2 ng/mL (ref <=4.0)

## 2019-10-27 LAB — LIPID PANEL
Cholesterol: 162 mg/dL (ref <200)
HDL: 74 mg/dL (ref >=40)
LDL Cholesterol (Calc): 70 mg/dL (calc)
Non-HDL Cholesterol (Calc): 88 mg/dL (calc) (ref <130)
Total CHOL/HDL Ratio: 2.2 (calc) (ref <5.0)
Triglycerides: 95 mg/dL (ref <150)

## 2019-10-27 LAB — TSH: TSH: 1.87 mIU/L (ref 0.40–4.50)

## 2019-10-27 LAB — LEAD, BLOOD (ADULT >= 16 YRS): Lead: 15 ug/dL — ABNORMAL HIGH (ref <5)

## 2019-10-27 LAB — VITAMIN B1: Vitamin B1 (Thiamine): 26 nmol/L (ref 8–30)

## 2019-10-27 MED ORDER — LEVOTHYROXINE SODIUM 100 MCG PO TABS: 100.0000 ug | ORAL_TABLET | Freq: Every day | ORAL | 1 refills | Status: DC

## 2019-10-27 MED ORDER — ROSUVASTATIN CALCIUM 20 MG PO TABS: 20.0000 mg | ORAL_TABLET | Freq: Every day | ORAL | 1 refills | Status: DC

## 2019-10-27 MED ORDER — CANDESARTAN CILEXETIL 32 MG PO TABS: 32.0000 mg | ORAL_TABLET | Freq: Every day | ORAL | 1 refills | Status: DC

## 2019-10-28 ENCOUNTER — Other Ambulatory Visit: Payer: Self-pay | Admitting: Internal Medicine

## 2019-10-28 ENCOUNTER — Encounter: Payer: Self-pay | Admitting: Internal Medicine

## 2019-10-28 DIAGNOSIS — F5101 Primary insomnia: Secondary | ICD-10-CM

## 2019-10-28 MED ORDER — ZOLPIDEM TARTRATE 10 MG PO TABS: ORAL_TABLET | ORAL | 1 refills | Status: DC

## 2019-11-05 ENCOUNTER — Telehealth: Payer: Self-pay

## 2019-11-06 ENCOUNTER — Other Ambulatory Visit: Payer: Self-pay | Admitting: Internal Medicine

## 2019-11-06 DIAGNOSIS — I10 Essential (primary) hypertension: Secondary | ICD-10-CM

## 2019-11-06 NOTE — Telephone Encounter (Signed)
Record updated

## 2019-11-27 ENCOUNTER — Ambulatory Visit: Admitting: Neurology

## 2019-11-27 NOTE — Progress Notes (Signed)
* * *      Garlick, Taiquan N **DOB:** Sep 03, 1950 [phone] yo M) **Acc No.** [id number] **DOS:**  11/27/2019    ---       **Banghart, [name]**    ------    36 Y old Male, DOB: February 07, 1951    [address], [place], [place] [address]    Home: [phone]    Provider: [name]        * * *    Telephone Encounter    ---    Answered by  Jari Sportsman Date: 11/27/2019       Time: 03:13 PM    Caller  patient    ------            Reason  requesting new standing lab order            Message                     Hi,            This pt called because he states he gets his Dilantin levels checked every 4-5wks at [facility] in [place] and the last time he went they told him the order he has is over a yr old and he needs a new standing order. His cb # is [phone], thanks!                Action Taken                     Curran,[name]  11/27/2019 3:22:32 PM >      TAN,SANDY  11/27/2019 3:53:51 PM > [name], I added under virtual visit. Please send back to Dr. [name] after coordinating. Thank you!      Curran,[name]  11/28/2019 9:38:44 AM > thanks! Faxed to [facility] in [place] and mailed to confirmed address on file, sent JB to Dr. [name].                    * * *              * * *        ---      Reason for Appointment    ---     1\. Need new standing order    ---     Assessments    ---    1\. Seizure - R56.9 (Primary)    ---     Treatment    ---      **1\. Seizure**    _LAB: Phenytoin (Dilantin)_   standing order; please check dilantin levels  every 4 to 5 weeks, expiration date August 2022.    ------          * * *         Provider: [name] 11/27/2019    ---    Note generated by eClinicalWorks EMR/PM Software ([email or web address])

## 2019-12-01 ENCOUNTER — Ambulatory Visit

## 2019-12-04 ENCOUNTER — Ambulatory Visit: Admitting: Neurology

## 2019-12-04 NOTE — Progress Notes (Signed)
* * *      [name] [name], [name] [name] **DOB:** [birth year] [phone] M) **Acc No.** [id number] **DOS:**  12/04/2019    ---       **[name], [name]**    ------    32 Y old Male, DOB: [birth year]    [address] [place], [place] [address]    Home: [phone]    Provider: [name]        * * *    Telephone Encounter    ---    Answered by  [name] Date: 12/04/2019       Time: 09:44 AM    Reason  labs ok    ------            Action Taken                     [name] [name]  12/04/2019 9:44:54 AM > pls let him know- dilantin level = 20, let's repeat 3 weeks thx JO      Curran,[name]  12/04/2019 11:52:31 AM > left VM for pt to cb and inform of lab results above and confirm they will repeat in 3 wks, thanks!      McIntosh,[name]  12/04/2019 11:57:22 AM > Patient is returning [name] call. Thanks.      Curran,[name]  12/04/2019 12:17:25 PM > informed pt the above and he confirmed he will repeat in 3 wks, thanks!                    * * *                ---          * * *         Provider: [name], [name] 12/04/2019    ---    Note generated by eClinicalWorks EMR/PM Software ([email or web address])

## 2020-01-08 ENCOUNTER — Other Ambulatory Visit: Payer: Self-pay | Admitting: Internal Medicine

## 2020-01-08 DIAGNOSIS — I10 Essential (primary) hypertension: Secondary | ICD-10-CM

## 2020-01-08 DIAGNOSIS — I712 Thoracic aortic aneurysm, without rupture, unspecified: Secondary | ICD-10-CM

## 2020-01-08 MED ORDER — CARVEDILOL 6.25 MG PO TABS: ORAL_TABLET | ORAL | 1 refills | Status: DC

## 2020-01-14 ENCOUNTER — Ambulatory Visit: Admit: 2020-01-14 | Payer: Medicare PPO

## 2020-01-14 ENCOUNTER — Ambulatory Visit: Admitting: Neurology

## 2020-01-14 DIAGNOSIS — G40909 Epilepsy, unspecified, not intractable, without status epilepticus: Principal | ICD-10-CM

## 2020-01-14 NOTE — Progress Notes (Signed)
* * *      Roseland, Konner N **DOB:** 06/22/1950 [phone] M) **Acc No.** [id number] **DOS:**  01/14/2020    ---       **Barron, [name]**    ------    73 Y old Male, DOB: [birth year]    [address] [place], [place] [address]    Home: [phone]    Provider: [name]        * * *    Telephone Encounter    ---    Answered by  [name] Date: 01/14/2020       Time: 01:26 PM    Reason  labs    ------            Action Taken                     Emmalena Canny  01/14/2020 1:26:42 PM > can you pls track down his blood [facility] thx Sharyn Blitz  01/15/2020 8:32:51 AM > Hi Dr. [name], they just faxed over his lab results from 10/4 and they are in his pt docs, thanks!      Lamarcus Spira  01/15/2020 10:59:45 AM > ok pls let him know dilantin = 18.1 let's repeat in 2 months before end of 2021- I think he has standing order there- many thx JO      Curran,[name]  01/15/2020 11:21:34 AM > left VM for pt to cb for above, thanks!      Victorino,Heidi  01/16/2020 11:39:24 AM > Patient called back I rely the message above, thank you.      Curran,[name]  01/16/2020 4:12:08 PM > thanks, confirmed w/pt he does have a standing order.                     * * *                ---          * * *         Provider: [name], Tyana Butzer 01/14/2020    ---    Note generated by eClinicalWorks EMR/PM Software ([email or web address])

## 2020-01-14 NOTE — Progress Notes (Signed)
 .  Progress Notes  .  Patient: [name] [name], [name] [name]  Provider: [name]    .  DOB: 05-Oct-1950 Age: 69 Y Sex: Male  .  PCP: Unknown, Unknown    Date: 01/14/2020  .  --------------------------------------------------------------------------------  .  REASON FOR APPOINTMENT  .  1. F/U  .  HISTORY OF PRESENT ILLNESS  .  GENERAL:   Dear Dr Marin Shutter : Many thanks for referring Mr [name] [name] to [facility]. He is a 69 year old patient with  seizures. His histoy has been extensively outlined- he has  presumptive complex partial seizures/ has been stable for quite  some time although from time to time he has breakthrough events-  but ad interim states there are no issues, complaints, has  refills, wants lab work after discussion, takes seizure/safety  precautions previously extensively reviewed. Dilantin levels have  been somewhat erratic/ so has had frequent lab draws over the  recent short term. No other issues.  .  CURRENT MEDICATIONS  .  Taking Aspirin 81 MG Tablet Chewable 1 tablet Orally Once a day  Taking Atorvastatin Calcium 10 MG Tablet 1 tablet Orally Once a  day, Notes: 20 mg  Taking CoQ10  Taking Dilantin 30 mg Capsule as directed Orally sig up to 3  tablets daily may undergo titration  Taking Dilantin 100 mg Capsule TAKE 5 CAPSULES BY MOUTH EVERY  MORNING WITH THE 30MG  Orally- NAME BRAND DAW MEDICALLY NECESSARY  , Notes: 4 capsules  Taking Lipitor 20 MG Tablet 1 tablet Orally Once a day  Taking Omeprazole 20 MG Capsule Delayed Release 1 capsule Orally  Once a day  Taking Potassium Chloride 20 MEQ Packet 1 packet with food Orally  Once a day  Medication List reviewed and reconciled with the patient  .  PAST MEDICAL HISTORY  .  See notes- seizures/cardiovascular disease  Seizures  .  ALLERGIES  .  yes[Allergies Verified]  .  SURGICAL HISTORY  .  No Surgical History documented.  Marland Kitchen  FAMILY HISTORY  .  No FH seizures.  .  SOCIAL HISTORY  .  .  Tobacco  history:Never smoked  .  no alcohol or  illicits.  .  HOSPITALIZATION/MAJOR DIAGNOSTIC PROCEDURE  .  No Hospitalization History.  Marland Kitchen  REVIEW OF SYSTEMS  .  GENERAL:  .  Outpatient questionnaire reviewed. Constitutional    No concerns  . Hematologic/Lymphatic    No lumps in the neck, lumps in the  groin, gingival bleeding, epistaxis . Negative for:    negative   . Positive for:    negative  . Eyes    negative  . ENT     negative  . Cardiovascular    No concerns . Respiratory    No  concerns . Gastrointestinal    No concerns .  Marland Kitchen  VITAL SIGNS  .  Pain scale 0, Ht-in 5 ft 10 in, Wt-lbs 181, BMI 25.97, BP 124/76,  HR 65, BSA 2.01, Ht-cm 177.8, Wt-kg 82.1, Wt Change -16 lb.  .  PHYSICAL EXAMINATION  .  GENERAL:  General Appearance:  unremarkable, Looks Healthy, well-developed,  well-hydrated, well-nourished, well-groomed, No dysmorphic  features, no acute distress, alert and oriented, pleasant.  Mood/Affect:  pleasant.  HEENT  Normocephalic, atraumatic. Sclera clear, conjunctiva pink.  Neck  No carotid Bruits, supple.  Skin  Normal.  Cardiovascular  no carotid bruits, no carotid bruits, heart  regular rate and rhythm, extremities warm without swelling.  Lungs  Clear to  auscultation bilaterally, No Wheezes, ronchi,  rales.  Extremities  There was no clubbing, cyanosis or edema noted.  MSE : A/OCN EOMI PERRLAMotor 5/5 ambulatory.  .  ASSESSMENTS  .  Seizure disorder - G40.909 (Primary)  .  69 year old with the above- PLAN : Seiuzre/safety precautions,  continue with dilantin for now but reviewed other options, levels  have been erratic may be impractical to continue after a certain  point with Pht/ all questions answered, agrees to follow with pht  X 6-12 months then consider options, [name] [name].  .  FOLLOW UP  .  1 Year  .  Electronically signed by [name] , [name] on  01/15/2020 at 09:41 PM EDT  .  Document electronically signed by [name]    .

## 2020-01-14 NOTE — Progress Notes (Signed)
 * * *    [name], [name] **DOB:** 1950/11/17 (69 yo M) **Acc No.** [id number] **DOS:**  01/14/2020    ---       **Colgate, [name]**    ------    69 Y old Male, DOB: [birth year], External MRN: [id number]    Account Number: [id number]    [address] [place], [id number]    Home: [phone]    Insurance: N78 [employer] ADV PPO    PCP: Unknown Unknown Referring: Unknown Unknown    Appointment Facility: Neurology        * * *    01/14/2020 Progress Notes: [name] MoccasinCHN#:** [id number]    ------    ---       **Reason for Appointment**    ---      1\. F/U    ---      **History of Present Illness**    ---     _GENERAL_ :    Dear Dr Marin Shutter : Many thanks for referring Mr [name] [name] to [facility]  Neurology. He is a 69 year old patient with seizures. His histoy has been  extensively outlined- he has presumptive complex partial seizures/ has been  stable for quite some time although from time to time he has breakthrough  events- but ad interim states there are no issues, complaints, has refills,  wants lab work after discussion, takes seizure/safety precautions previously  extensively reviewed. Dilantin levels have been somewhat erratic/ so has had  frequent lab draws over the recent short term. No other issues.      **Current Medications**    ---    Taking    * Aspirin 81 MG Tablet Chewable 1 tablet Orally Once a day    ---    * Atorvastatin Calcium 10 MG Tablet 1 tablet Orally Once a day, Notes: 20 mg    ---    * CoQ10     ---    * Dilantin 30 mg Capsule as directed Orally sig up to 3 tablets daily may undergo titration    ---    * Dilantin 100 mg Capsule TAKE 5 CAPSULES BY MOUTH EVERY MORNING WITH THE 30MG  Orally- NAME BRAND DAW MEDICALLY NECESSARY , Notes: 4 capsules    ---    * Lipitor 20 MG Tablet 1 tablet Orally Once a day    ---    * Omeprazole 20 MG Capsule Delayed Release 1 capsule Orally Once a day    ---    * Potassium Chloride 20 MEQ Packet 1 packet with food Orally Once a day    ---     Medication List reviewed and reconciled with the patient    ---      **Past Medical History**    ---      See notes- seizures/cardiovascular disease.        ---    Seizures.        ---      **Surgical History**    ---      No Surgical History documented.    ---      **Family History**    ---      No FH seizures.    ---      **Social History**    ---    Tobacco    history: _Never smoked_  no alcohol or illicits.    ---      **Hospitalization/Major Diagnostic Procedure**    ---  No Hospitalization History.    ---      **Review of Systems**    ---     _GENERAL_ :    Outpatient questionnaire reviewed. Constitutional No concerns.  Hematologic/Lymphatic No lumps in the neck, lumps in the groin, gingival  bleeding, epistaxis. Negative for: negative . Positive for: negative . Eyes  negative . ENT negative . Cardiovascular No concerns. Respiratory No concerns.  Gastrointestinal No concerns.         **Vital Signs**    ---    Pain scale **0** , Ht-in 5 ft 10 in, Wt-lbs **181** , BMI **25.97** , BP  **124/76** , HR **65** , BSA **2.01** , Ht-cm 177.8, Wt-kg **82.1** , Wt  Change -16 lb.      **Physical Examination**    ---     _GENERAL_ :    General Appearance: unremarkable, Looks Healthy, well-developed, well-  hydrated, well-nourished, well-groomed, No dysmorphic features, no acute  distress, alert and oriented, pleasant.    Mood/Affect: pleasant.    HEENT Normocephalic, atraumatic. Sclera clear, conjunctiva pink.    Neck No carotid Bruits, supple.    Skin Normal.    Cardiovascular no carotid bruits, no carotid bruits, heart regular rate and  rhythm, extremities warm without swelling.    Lungs Clear to auscultation bilaterally, No Wheezes, ronchi, rales.    Extremities There was no clubbing, cyanosis or edema noted.    MSE : A/O    CN EOMI PERRLA    Motor 5/5 ambulatory.      **Assessments**    ---    1\. Seizure disorder - G40.909 (Primary)    ---     69 year old with the above- PLAN : Seiuzre/safety  precautions, continue with  dilantin for now but reviewed other options, levels have been erratic may be  impractical to continue after a certain point with Pht/ all questions  answered, agrees to follow with pht X 6-12 months then consider options, [name].    ---      **Follow Up**    ---    1 Year    Electronically signed by [name] [name] , MD on 01/15/2020 at 09:41 PM EDT    Sign off status: Completed        * * *        Neurology    [address]    [place], [address]    [place], [place] [address]    Tel: [phone]    Fax: [phone]              * * *          Progress Note: [name] [name] 01/14/2020    ---    Note generated by eClinicalWorks EMR/PM Software ([email or web address])

## 2020-01-15 ENCOUNTER — Ambulatory Visit (HOSPITAL_BASED_OUTPATIENT_CLINIC_OR_DEPARTMENT_OTHER): Admitting: Psychiatry

## 2020-01-15 ENCOUNTER — Ambulatory Visit

## 2020-01-16 DIAGNOSIS — R69 Illness, unspecified: Secondary | ICD-10-CM | POA: Diagnosis not present

## 2020-01-22 ENCOUNTER — Other Ambulatory Visit: Payer: Self-pay | Admitting: Internal Medicine

## 2020-01-22 DIAGNOSIS — E538 Deficiency of other specified B group vitamins: Secondary | ICD-10-CM

## 2020-01-22 DIAGNOSIS — F04 Amnestic disorder due to known physiological condition: Secondary | ICD-10-CM

## 2020-02-10 ENCOUNTER — Other Ambulatory Visit: Payer: Medicare HMO

## 2020-02-10 DIAGNOSIS — Z20822 Contact with and (suspected) exposure to covid-19: Secondary | ICD-10-CM | POA: Diagnosis not present

## 2020-02-11 LAB — SARS-COV-2, NAA 2 DAY TAT

## 2020-02-11 LAB — NOVEL CORONAVIRUS, NAA: SARS-CoV-2, NAA: NOT DETECTED

## 2020-03-12 ENCOUNTER — Ambulatory Visit

## 2020-03-12 ENCOUNTER — Ambulatory Visit: Admitting: Neurology

## 2020-03-12 NOTE — Progress Notes (Signed)
* * *      [name], [name] N **DOB:** 27-Oct-1950 [phone] yo M) **Acc No.** [id number] **DOS:**  03/12/2020    ---        **[name], [name] Brunt**    ------    58 Y old Male, DOB: May 24, 1950    [address], [place], [place] [address]    Home: [phone]    Provider: [name]        * * *    Telephone Encounter    ---    Answered by    [name]    Date: 03/12/2020        Time: 04:40 PM    Reason    labs    ------            Action Taken                      [name] [name]  03/12/2020 4:40:12 PM > dilantin in range pls let him know, labs - 2 months thx [name] [name]  03/15/2020 12:07:06 PM > spoke to [name] [name].  Informed him of results.  He will get repeat in 2 months.                      * * *                ---          * * *          Provider: [name], [name] [name] 03/12/2020    ---    Note generated by eClinicalWorks EMR/PM Software ([email or web address])

## 2020-03-24 ENCOUNTER — Other Ambulatory Visit: Payer: Self-pay | Admitting: Internal Medicine

## 2020-03-24 DIAGNOSIS — K219 Gastro-esophageal reflux disease without esophagitis: Secondary | ICD-10-CM

## 2020-03-29 ENCOUNTER — Other Ambulatory Visit: Payer: Self-pay | Admitting: Internal Medicine

## 2020-03-29 DIAGNOSIS — I712 Thoracic aortic aneurysm, without rupture, unspecified: Secondary | ICD-10-CM

## 2020-04-12 ENCOUNTER — Other Ambulatory Visit: Payer: Self-pay | Admitting: Internal Medicine

## 2020-04-12 DIAGNOSIS — I10 Essential (primary) hypertension: Secondary | ICD-10-CM

## 2020-04-13 ENCOUNTER — Other Ambulatory Visit (INDEPENDENT_AMBULATORY_CARE_PROVIDER_SITE_OTHER): Payer: Medicare HMO

## 2020-04-13 DIAGNOSIS — I10 Essential (primary) hypertension: Secondary | ICD-10-CM | POA: Diagnosis not present

## 2020-04-13 LAB — BASIC METABOLIC PANEL
BUN: 27 mg/dL — ABNORMAL HIGH (ref 6–23)
CO2: 26 mEq/L (ref 19–32)
Calcium: 9.1 mg/dL (ref 8.4–10.5)
Chloride: 106 mEq/L (ref 96–112)
Creatinine, Ser: 0.95 mg/dL (ref 0.40–1.50)
GFR: 82.53 mL/min (ref >60.00)
Glucose, Bld: 88 mg/dL (ref 70–99)
Potassium: 3.8 mEq/L (ref 3.5–5.1)
Sodium: 142 mEq/L (ref 135–145)

## 2020-04-15 ENCOUNTER — Ambulatory Visit (INDEPENDENT_AMBULATORY_CARE_PROVIDER_SITE_OTHER)
Admission: RE | Admit: 2020-04-15 | Discharge: 2020-04-15 | Disposition: A | Discharge status: Home / Self Care | Payer: Medicare HMO | Source: Ambulatory Visit | Attending: Internal Medicine | Admitting: Internal Medicine

## 2020-04-15 ENCOUNTER — Other Ambulatory Visit: Payer: Self-pay

## 2020-04-15 DIAGNOSIS — I712 Thoracic aortic aneurysm, without rupture, unspecified: Secondary | ICD-10-CM

## 2020-04-15 MED ORDER — IOHEXOL 350 MG/ML SOLN
100.0000 mL | Freq: Once | INTRAVENOUS | Status: AC | PRN
  Administered 2020-04-15: 100 mL via INTRAVENOUS

## 2020-04-22 ENCOUNTER — Other Ambulatory Visit: Payer: Self-pay

## 2020-04-22 ENCOUNTER — Encounter: Payer: Self-pay | Admitting: Internal Medicine

## 2020-04-22 ENCOUNTER — Ambulatory Visit (INDEPENDENT_AMBULATORY_CARE_PROVIDER_SITE_OTHER): Payer: Medicare HMO | Admitting: Internal Medicine

## 2020-04-22 VITALS — BP 176/104 | HR 69 | Temp 98.1°F | Ht 70.0 in | Wt 189.0 lb

## 2020-04-22 DIAGNOSIS — E519 Thiamine deficiency, unspecified: Secondary | ICD-10-CM

## 2020-04-22 DIAGNOSIS — R9431 Abnormal electrocardiogram [ECG] [EKG]: Secondary | ICD-10-CM

## 2020-04-22 DIAGNOSIS — I10 Essential (primary) hypertension: Secondary | ICD-10-CM

## 2020-04-22 DIAGNOSIS — E039 Hypothyroidism, unspecified: Secondary | ICD-10-CM | POA: Diagnosis not present

## 2020-04-22 LAB — URINALYSIS, ROUTINE W REFLEX MICROSCOPIC
Bilirubin Urine: NEGATIVE
Hgb urine dipstick: NEGATIVE
Ketones, ur: NEGATIVE
Leukocytes,Ua: NEGATIVE
Nitrite: NEGATIVE
RBC / HPF: NONE SEEN (ref 0–?)
Specific Gravity, Urine: >=1.03 — AB (ref 1.000–1.030)
Total Protein, Urine: NEGATIVE
Urine Glucose: NEGATIVE
Urobilinogen, UA: 0.2 (ref 0.0–1.0)
pH: 5.5 (ref 5.0–8.0)

## 2020-04-22 LAB — BASIC METABOLIC PANEL
BUN: 32 mg/dL — ABNORMAL HIGH (ref 6–23)
CO2: 28 mEq/L (ref 19–32)
Calcium: 9.4 mg/dL (ref 8.4–10.5)
Chloride: 102 mEq/L (ref 96–112)
Creatinine, Ser: 1.16 mg/dL (ref 0.40–1.50)
GFR: 64.93 mL/min (ref >60.00)
Glucose, Bld: 97 mg/dL (ref 70–99)
Potassium: 4 mEq/L (ref 3.5–5.1)
Sodium: 139 mEq/L (ref 135–145)

## 2020-04-22 LAB — CBC WITH DIFFERENTIAL/PLATELET
Basophils Absolute: 0.1 10*3/uL (ref 0.0–0.1)
Basophils Relative: 0.8 % (ref 0.0–3.0)
Eosinophils Absolute: 0.1 10*3/uL (ref 0.0–0.7)
Eosinophils Relative: 2 % (ref 0.0–5.0)
HCT: 44.4 % (ref 39.0–52.0)
Hemoglobin: 15.2 g/dL (ref 13.0–17.0)
Lymphocytes Relative: 18.5 % (ref 12.0–46.0)
Lymphs Abs: 1.2 10*3/uL (ref 0.7–4.0)
MCHC: 34.2 g/dL (ref 30.0–36.0)
MCV: 92.6 fl (ref 78.0–100.0)
Monocytes Absolute: 0.6 10*3/uL (ref 0.1–1.0)
Monocytes Relative: 8.5 % (ref 3.0–12.0)
Neutro Abs: 4.7 10*3/uL (ref 1.4–7.7)
Neutrophils Relative %: 70.2 % (ref 43.0–77.0)
Platelets: 304 10*3/uL (ref 150.0–400.0)
RBC: 4.79 Mil/uL (ref 4.22–5.81)
RDW: 14.1 % (ref 11.5–15.5)
WBC: 6.7 10*3/uL (ref 4.0–10.5)

## 2020-04-22 LAB — TSH: TSH: 2.97 u[IU]/mL (ref 0.35–4.50)

## 2020-04-22 LAB — MAGNESIUM: Magnesium: 1.3 mg/dL — ABNORMAL LOW (ref 1.5–2.5)

## 2020-04-22 LAB — BRAIN NATRIURETIC PEPTIDE: Pro B Natriuretic peptide (BNP): 38 pg/mL (ref 0.0–100.0)

## 2020-04-22 LAB — TROPONIN I (HIGH SENSITIVITY): High Sens Troponin I: 10 ng/L (ref 2–17)

## 2020-04-22 MED ORDER — INDAPAMIDE 1.25 MG PO TABS: 1.2500 mg | ORAL_TABLET | Freq: Every day | ORAL | 0 refills | Status: DC

## 2020-04-22 MED ORDER — MAGNESIUM OXIDE 250 MG PO TABS
1.0000 | ORAL_TABLET | Freq: Two times a day (BID) | ORAL | 1 refills | Status: DC
Start: 2020-04-22 — End: 2020-09-14

## 2020-04-22 NOTE — Patient Instructions (Signed)

## 2020-04-22 NOTE — Progress Notes (Unsigned)
Subjective:  Patient ID: Brandon [name], male    DOB: Aug 29, 1950  Age: 70 y.o. MRN: [id number]  CC: Hypertension  This visit occurred during the SARS-CoV-2 public health emergency.  Safety protocols were in place, including screening questions prior to the visit, additional usage of staff PPE, and extensive cleaning of exam room while observing appropriate contact time as indicated for disinfecting solutions.    HPI Brandon [name] presents for f/up - He is concerned that his blood pressure has not been well controlled.  He has checked it a few times recently and the systolic was 151 and 761.  He has taken a few doses of ibuprofen but does not take decongestants.  He is very active and denies any recent episodes of chest pain, shortness of breath, diaphoresis, dizziness, or lightheadedness.  Outpatient Medications Prior to Visit  Medication Sig Dispense Refill  . aspirin 81 MG tablet Take 81 mg by mouth daily.    . candesartan (ATACAND) 32 MG tablet Take 1 tablet (32 mg total) by mouth daily. 90 tablet 1  . carvedilol (COREG) 6.25 MG tablet TAKE 1 TABLET BY MOUTH 2 TIMES DAILY WITH A MEAL 180 tablet 1  . gabapentin (NEURONTIN) 300 MG capsule Take 1 capsule (300 mg total) by mouth at bedtime. 90 capsule 3  . levothyroxine (SYNTHROID) 100 MCG tablet Take 1 tablet (100 mcg total) by mouth daily. 90 tablet 1  . omeprazole (PRILOSEC) 40 MG capsule TAKE 1 CAPSULE BY MOUTH EVERY DAY 90 capsule 1  . rosuvastatin (CRESTOR) 20 MG tablet Take 1 tablet (20 mg total) by mouth daily. 90 tablet 1  . thiamine 100 MG tablet TAKE 1/2 TABLET DAILY 45 tablet 1  . zolpidem (AMBIEN) 10 MG tablet TAKE 1 TABLET AT BEDTIME ASNEEDED 90 tablet 1  . Magnesium Oxide 250 MG TABS TAKE 1 TABLET BY MOUTH TWICE A DAY 180 tablet 1   No facility-administered medications prior to visit.    ROS Review of Systems  Constitutional: Negative for appetite change, diaphoresis, fatigue and unexpected weight change.  HENT:  Negative.   Eyes: Negative for visual disturbance.  Respiratory: Negative for cough, chest tightness, shortness of breath and wheezing.   Cardiovascular: Negative for chest pain, palpitations and leg swelling.  Gastrointestinal: Negative for abdominal pain, constipation, diarrhea, nausea and vomiting.  Endocrine: Negative.  Negative for cold intolerance and heat intolerance.  Genitourinary: Negative.  Negative for difficulty urinating.  Musculoskeletal: Positive for arthralgias. Negative for myalgias.  Skin: Negative.  Negative for color change.  Neurological: Negative.  Negative for dizziness, weakness, light-headedness and headaches.  Hematological: Negative for adenopathy. Does not bruise/bleed easily.  Psychiatric/Behavioral: Negative.     Objective:  BP (!) 176/104   Pulse 69   Temp 98.1 F (36.7 C) (Oral)   Ht 5\' 10"  (1.778 m)   Wt 189 lb (85.7 kg)   SpO2 98%   BMI 27.12 kg/m   BP Readings from Last 3 Encounters:  04/22/20 (!) 176/104  10/22/19 (!) 168/94  07/25/19 140/80    Wt Readings from Last 3 Encounters:  04/22/20 189 lb (85.7 kg)  10/22/19 182 lb 3 oz (82.6 kg)  07/25/19 186 lb (84.4 kg)    Physical Exam Vitals reviewed.  Constitutional:      Appearance: Normal appearance.  HENT:     Nose: Nose normal.     Mouth/Throat:     Mouth: Mucous membranes are moist.  Eyes:     General: No scleral icterus.  Conjunctiva/sclera: Conjunctivae normal.  Cardiovascular:     Rate and Rhythm: Normal rate and regular rhythm.     Heart sounds: No murmur heard.     Comments: SR with 1st degree AV block No LVH Normal EKG Pulmonary:     Effort: Pulmonary effort is normal.     Breath sounds: No stridor. No wheezing, rhonchi or rales.  Abdominal:     General: Abdomen is flat. Bowel sounds are normal. There is no distension.     Palpations: Abdomen is soft. There is no hepatomegaly, splenomegaly or mass.     Tenderness: There is no abdominal tenderness.   Musculoskeletal:     Cervical back: Neck supple.     Right lower leg: No edema.     Left lower leg: No edema.  Lymphadenopathy:     Cervical: No cervical adenopathy.  Skin:    General: Skin is warm and dry.  Neurological:     General: No focal deficit present.     Mental Status: He is alert.  Psychiatric:        Mood and Affect: Mood normal.        Behavior: Behavior normal.     Lab Results  Component Value Date   WBC 6.7 04/22/2020   HGB 15.2 04/22/2020   HCT 44.4 04/22/2020   PLT 304.0 04/22/2020   GLUCOSE 97 04/22/2020   CHOL 162 10/22/2019   TRIG 95 10/22/2019   HDL 74 10/22/2019   LDLDIRECT 147.7 12/03/2007   LDLCALC 70 10/22/2019   ALT 20 10/22/2019   AST 25 10/22/2019   NA 139 04/22/2020   K 4.0 04/22/2020   CL 102 04/22/2020   CREATININE 1.16 04/22/2020   BUN 32 (H) 04/22/2020   CO2 28 04/22/2020   TSH 2.97 04/22/2020   PSA 1.2 10/22/2019   INR 1.1 (H) 04/24/2018   HGBA1C 5.5 02/20/2017    CT ANGIO CHEST AORTA W/CM & OR WO/CM  Result Date: 04/15/2020 CLINICAL DATA:  Thoracic aortic aneurysm EXAM: CT ANGIOGRAPHY CHEST WITH CONTRAST TECHNIQUE: Multidetector CT imaging of the chest was performed using the standard protocol during bolus administration of intravenous contrast. Multiplanar CT image reconstructions and MIPs were obtained to evaluate the vascular anatomy. CONTRAST:  120mL OMNIPAQUE IOHEXOL 350 MG/ML SOLN COMPARISON:  CTA chest 04/01/2018 FINDINGS: Cardiovascular: Fusiform aneurysmal dilation of the tubular portion of the ascending thoracic aorta is insignificantly changed compared to prior imaging measuring 4.2 cm compared to 4.1 cm. Conventional 3 vessel arch anatomy. Trace scattered atherosclerotic calcifications. Elongation of the aortic isthmus and descending thoracic aorta results in tortuosity and a type 3 arch. The aortic root is normal in caliber. No effacement of the sino-tubular junction. Normal caliber arch and descending thoracic aorta. The  visualized cardiac structures are normal in size. No pericardial effusion. Mediastinum/Nodes: Unremarkable CT appearance of the thyroid gland. Stable mediastinal lymph nodes which are prominent in number but not enlarged by imaging criteria. No interval growth dating back to December of 2019. No soft tissue mediastinal mass. The thoracic esophagus is unremarkable. Lungs/Pleura: Stable biapical pleuroparenchymal scarring. The lungs are otherwise clear. Upper Abdomen: Stable giant cyst exophytic from the upper pole of the left kidney. Musculoskeletal: No acute fracture or aggressive appearing lytic or blastic osseous lesion. Review of the MIP images confirms the above findings. IMPRESSION: 1. Stable mild aneurysmal dilation of the ascending thoracic aorta with a maximal diameter of 4.2 cm. Recommend annual imaging followup by CTA or MRA. This recommendation follows 2010 ACCF/AHA/AATS/ACR/ASA/SCA/SCAI/SIR/STS/SVM Guidelines  for the Diagnosis and Management of Patients with Thoracic Aortic Disease. Circulation. 2010; 121ML:4928372. Aortic aneurysm NOS (ICD10-I71.9) 2. Stable mediastinal lymph nodes which are prominent in number but not enlarged by imaging criteria. 3. Additional ancillary findings as above without significant interval change. Signed, Criselda Peaches, MD, [facility]iology Electronically Signed   By: Jacqulynn Cadet M.D.   On: 04/15/2020 11:14    Assessment & Plan:   Tharun was seen today for hypertension.  Diagnoses and all orders for this visit:  Essential hypertension, benign- His blood pressure is not adequately well controlled.  I recommended that he stop taking ibuprofen.  Will add indapamide to his current antihypertensive regimen. -     CBC with Differential/Platelet; Future -     Basic metabolic panel; Future -     Urinalysis, Routine w reflex microscopic; Future -     EKG 12-Lead -     indapamide (LOZOL) 1.25 MG  tablet; Take 1 tablet (1.25 mg total) by mouth daily. -     Urinalysis, Routine w reflex microscopic -     Basic metabolic panel -     CBC with Differential/Platelet  Acquired hypothyroidism- His TSH is in the normal range.  He will stay on the current dose of levothyroxine. -     TSH; Future -     TSH  Hypomagnesemia -     Magnesium; Future -     Magnesium -     Magnesium Oxide 250 MG TABS; Take 1 tablet (250 mg total) by mouth 2 (two) times daily.  Thiamine deficiency -     CBC with Differential/Platelet; Future -     Vitamin B1; Future -     Vitamin B1 -     CBC with Differential/Platelet  Abnormal electrocardiogram (ECG) (EKG)- His EKG is improved compared to the prior EKG.  Troponin and BNP are normal.  This is reassuring. -     Troponin I (High Sensitivity); Future -     Brain natriuretic peptide; Future -     Brain natriuretic peptide -     Troponin I (High Sensitivity)   I have changed Gildardo Griffes. Edgell "Skip"'s Magnesium Oxide. I am also having him start on indapamide. Additionally, I am having him maintain his aspirin, gabapentin, levothyroxine, candesartan, rosuvastatin, zolpidem, carvedilol, thiamine, and omeprazole.  Meds ordered this encounter  Medications  . indapamide (LOZOL) 1.25 MG tablet    Sig: Take 1 tablet (1.25 mg total) by mouth daily.    Dispense:  90 tablet    Refill:  0  . Magnesium Oxide 250 MG TABS    Sig: Take 1 tablet (250 mg total) by mouth 2 (two) times daily.    Dispense:  180 tablet    Refill:  1    DX Code Needed  .     Follow-up: Return in about 6 weeks (around 06/03/2020).  Scarlette Calico, MD

## 2020-04-24 ENCOUNTER — Other Ambulatory Visit: Payer: Self-pay | Admitting: Internal Medicine

## 2020-04-24 DIAGNOSIS — I10 Essential (primary) hypertension: Secondary | ICD-10-CM

## 2020-04-26 ENCOUNTER — Encounter: Payer: Self-pay | Admitting: Internal Medicine

## 2020-04-26 ENCOUNTER — Other Ambulatory Visit: Payer: Self-pay | Admitting: Internal Medicine

## 2020-04-26 DIAGNOSIS — M5030 Other cervical disc degeneration, unspecified cervical region: Secondary | ICD-10-CM

## 2020-04-26 DIAGNOSIS — T451X5A Adverse effect of antineoplastic and immunosuppressive drugs, initial encounter: Secondary | ICD-10-CM

## 2020-04-26 DIAGNOSIS — G620 Drug-induced polyneuropathy: Secondary | ICD-10-CM

## 2020-04-26 MED ORDER — GABAPENTIN 300 MG PO CAPS: 300.0000 mg | ORAL_CAPSULE | Freq: Every day | ORAL | 1 refills | Status: DC

## 2020-04-27 LAB — VITAMIN B1: Vitamin B1 (Thiamine): 101 nmol/L — ABNORMAL HIGH (ref 8–30)

## 2020-04-29 ENCOUNTER — Other Ambulatory Visit: Payer: Self-pay | Admitting: Internal Medicine

## 2020-04-29 DIAGNOSIS — F5101 Primary insomnia: Secondary | ICD-10-CM

## 2020-04-30 ENCOUNTER — Telehealth: Payer: Self-pay

## 2020-04-30 NOTE — Telephone Encounter (Signed)
Key: [id number]  PA has been submitted for zolpidem 10 mg tablets.

## 2020-05-04 ENCOUNTER — Ambulatory Visit: Admit: 2020-05-04 | Payer: Medicare PPO | Attending: Specialist | Admitting: Specialist

## 2020-05-04 ENCOUNTER — Ambulatory Visit: Admitting: Specialist

## 2020-05-04 ENCOUNTER — Encounter (HOSPITAL_BASED_OUTPATIENT_CLINIC_OR_DEPARTMENT_OTHER)

## 2020-05-04 ENCOUNTER — Ambulatory Visit (HOSPITAL_BASED_OUTPATIENT_CLINIC_OR_DEPARTMENT_OTHER)

## 2020-05-04 NOTE — Progress Notes (Signed)
 05-04-2020  SHORT- BO  Duker, Jay S    MACULAR HOLE OR PSEUDOHOLE; Left Eye (H35.342) (OS)  Referred by Dr. Renette Butters for [facility]. Patient has had decreased vision OS since Oct 2019.  S/p PPV gas, ILM peel 05-07-18. Pre-op VA = 20/400  s/p CE/IOL in November '20  ______________________________________    Va = 20/40, stable, no new symptoms. Still with stable distortion.     OCT MACULA OS 05-04-2020: closure of hole. No SRF/IRF. No PVD or tx OD.    Imp - [facility] OS s/p PPV 2 years ago. Excellent result.     Plan - here in one year or Dr. Renette Butters, sooner if new symptoms  F/U: 1 Year OCT OU Gevena Cotton    04-23-2019  LONG- BO  Kapadia, Mitesh     Ptosis; Both Eyes (H02.403) (OU)  Ptosis/dermatochalasis - not bothered enough by symptoms to want surgery at this time. Can schedule in future if decides to proceed. Would start with upper bleph alone. r/b/a discussed. Would need to stop asa x 2 weeks prior with permission of pcp.

## 2020-05-11 ENCOUNTER — Ambulatory Visit: Admitting: Neurology

## 2020-05-11 MED ORDER — Dilantin: 30 | Capsule | 4 refills | 0 days | Status: DC

## 2020-05-16 ENCOUNTER — Other Ambulatory Visit: Payer: Self-pay | Admitting: Internal Medicine

## 2020-05-16 DIAGNOSIS — E039 Hypothyroidism, unspecified: Secondary | ICD-10-CM

## 2020-06-17 NOTE — Progress Notes (Signed)
Brandon [name] [address] [place] Phone: [phone] Subjective:   I Brandon [name] am serving as a Education administrator for Dr. Hulan [name].  This visit occurred during the SARS-CoV-2 public health emergency.  Safety protocols were in place, including screening questions prior to the visit, additional usage of staff PPE, and extensive cleaning of exam room while observing appropriate contact time as indicated for disinfecting solutions.   I'm seeing this patient by the request  of:  Brandon Lima, MD  CC: Neck pain follow-up  [id number]   03/26/2019 Severe arthritic changes but doing very well with conservative therapy.  Continue the gabapentin at the same dose of 300 mg at night, has anti-inflammatories for any breakthrough pain as well as muscle relaxer.  As long as patient is well can follow-up with me more on an as-needed basis  Update 06/22/2020 Brandon [name] is a 70 y.o. male coming in with complaint of neck pain. Last seen 03/26/2019. States his neck is not too bad as right now but on most days it is severely painful. Would like an MRI. States that his neck can hurt bad for about a day or 2 where he can't do anything. Massage every other week and get cupping done. States with lifting something heavy he has increased pain. States he has severe arthritis in the neck. Has been using topical CBD. Patient lifts a lot of glass that weights about 30-60 lbs that he caries a short distance. Tries to switch what he does every 2 hours.   Xray cervical 09/18/2018 IMPRESSION: Extensive arthropathy, most marked at C4-5. Mild spondylolisthesis at C3-4 is felt to be due to underlying spondylosis. No other spondylolisthesis evident. No fracture. Reversal of lordotic curvature is likely due to chronic muscle spasm.     Past Medical History:  Diagnosis Date  . Allergy    mild  . Aortic aneurysm (HCC)    small and watching  . GERD (gastroesophageal  reflux disease)   . Head and neck cancer    head and neck ca dx 03/2009  . Hyperlipidemia   . Hypertension   . oropharyngeal ca dx'd 03/2009   chemo/xrt comp 06/2009  . Thyroid disease    Past Surgical History:  Procedure Laterality Date  . HERNIA REPAIR     inguinal  . hip replacemnt left Left   . Left eye    . stmach surgery     repair peg tube site   Social History   Socioeconomic History  . Marital status: Married    Spouse name: Not on file  . Number of children: 2  . Years of education: 33  . Highest education level: Not on file  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 History  . 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 Smoking status: Former Smoker    Packs/day: 1.50    Years: 35.00    Pack years: 52.50    Types: Cigarettes, Cigars    Start date: 05/16/1973    Quit date: 04/19/2009    Years since quitting: 11.1  . Smokeless tobacco: Never Used  . Tobacco comment: quit smoking 5 years sago  Substance and Sexual Activity  . Alcohol use: Yes    Alcohol/week: 30.0 standard drinks    Types: 30 Shots of liquor per week  . Drug use: No  . Sexual activity: Yes    Partners: Female  Other Topics Concern  . Not on file  Social History Narrative  [employer], [employer]-no degree. married - '76 - 5 years, divorced; remarried  '84. 1 son - '85; 1 daughter '89. 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    Social Determinants of Health   Financial Resource Strain: Not on file  Food Insecurity: Not on file  Transportation Needs: Not on file  Physical Activity: Not on file  Stress: Not on file  Social Connections: Not on file   Allergies  Allergen Reactions  . Benicar [Olmesartan]     fatigue  . Amlodipine Swelling   Family History  Problem Relation Age of Onset  . Cancer Mother        lymphoma  . Colon cancer Mother   . Cancer Father        lung (smoker)  . Hyperlipidemia Father   . Hypertension Father   . Diabetes Father   . Esophageal cancer  Neg Hx   . Rectal cancer Neg Hx   . Stomach cancer Neg Hx     Current Outpatient Medications (Endocrine & Metabolic):  .  levothyroxine (SYNTHROID) 100 MCG tablet, TAKE 1 TABLET BY MOUTH EVERY DAY  Current Outpatient Medications (Cardiovascular):  .  candesartan (ATACAND) 32 MG tablet, TAKE 1 TABLET (32 MG TOTAL) BY MOUTH DAILY. .  carvedilol (COREG) 6.25 MG tablet, TAKE 1 TABLET BY MOUTH 2 TIMES DAILY WITH A MEAL .  indapamide (LOZOL) 1.25 MG tablet, Take 1 tablet (1.25 mg total) by mouth daily. .  rosuvastatin (CRESTOR) 20 MG tablet, Take 1 tablet (20 mg total) by mouth daily.   Current Outpatient Medications (Analgesics):  .  aspirin 81 MG tablet, Take 81 mg by mouth daily.   Current Outpatient Medications (Other):  .  gabapentin (NEURONTIN) 300 MG capsule, Take 1 capsule (300 mg total) by mouth at bedtime. .  Magnesium Oxide 250 MG TABS, Take 1 tablet (250 mg total) by mouth 2 (two) times daily. Marland Kitchen  omeprazole (PRILOSEC) 40 MG capsule, TAKE 1 CAPSULE BY MOUTH EVERY DAY .  thiamine 100 MG tablet, TAKE 1/2 TABLET DAILY .  zolpidem (AMBIEN) 10 MG tablet, TAKE 1 TABLET BY MOUTH EVERY DAY AT BEDTIME AS NEEDED   Reviewed prior external information including notes and imaging from  primary care provider As well as notes that were available from care everywhere and other healthcare systems.  Past medical history, social, surgical and family history all reviewed in electronic medical record.  No pertanent information unless stated regarding to the chief complaint.   Review of Systems:  No headache, visual changes, nausea, vomiting, diarrhea, constipation, dizziness, abdominal pain, skin rash, fevers, chills, night sweats, weight loss, swollen lymph nodes, body aches, joint swelling, chest pain, shortness of breath, mood changes. POSITIVE muscle aches  Objective  Blood pressure 130/80, pulse (!) 52, height 5\' 10"  (1.778 m), weight 184 lb (83.5 kg), SpO2 100 %.   General: No apparent  distress alert and oriented x3 mood and affect normal, dressed appropriately.  HEENT: Pupils equal, extraocular movements intact  Respiratory: Patient's speak in full sentences and does not appear short of breath  Cardiovascular: No lower extremity edema, non tender, no erythema  Gait normal with good balance and coordination.  MSK:   Neck exam does show some loss of lordosis.  Patient does have pain with extension of the neck greater than 10 degrees.  Patient does have very mild weakness with grip strength but very minimal.  Deep tendon reflexes do appear to be intact.  Limited side bending of the neck bilaterally.  Impression and Recommendations:     The above documentation has been reviewed and is accurate and complete Brandon Pulley, DO

## 2020-06-21 DIAGNOSIS — H26492 Other secondary cataract, left eye: Secondary | ICD-10-CM | POA: Diagnosis not present

## 2020-06-21 DIAGNOSIS — H40053 Ocular hypertension, bilateral: Secondary | ICD-10-CM | POA: Diagnosis not present

## 2020-06-21 DIAGNOSIS — H25811 Combined forms of age-related cataract, right eye: Secondary | ICD-10-CM | POA: Diagnosis not present

## 2020-06-21 DIAGNOSIS — Z961 Presence of intraocular lens: Secondary | ICD-10-CM | POA: Diagnosis not present

## 2020-06-21 DIAGNOSIS — H43811 Vitreous degeneration, right eye: Secondary | ICD-10-CM | POA: Diagnosis not present

## 2020-06-22 ENCOUNTER — Encounter: Payer: Self-pay | Admitting: Family Medicine

## 2020-06-22 ENCOUNTER — Ambulatory Visit (INDEPENDENT_AMBULATORY_CARE_PROVIDER_SITE_OTHER): Payer: Medicare HMO

## 2020-06-22 ENCOUNTER — Ambulatory Visit: Payer: Medicare HMO | Admitting: Family Medicine

## 2020-06-22 ENCOUNTER — Other Ambulatory Visit: Payer: Self-pay

## 2020-06-22 VITALS — BP 130/80 | HR 52 | Ht 70.0 in | Wt 184.0 lb

## 2020-06-22 DIAGNOSIS — M542 Cervicalgia: Secondary | ICD-10-CM

## 2020-06-22 DIAGNOSIS — M5030 Other cervical disc degeneration, unspecified cervical region: Secondary | ICD-10-CM

## 2020-06-22 NOTE — Patient Instructions (Addendum)
Good to see you  Ice 20 minutes 2 times daily. Usually after activity and before bed. Keep using the cream  Stay active and do the exercises Keep hands within peripheral vison  We will get new xray today  Also will order MRi of the neck to further evaluate and then have the possibility of epidurals if needed   See me again in 2-3 months but we will be talking before then

## 2020-06-22 NOTE — Assessment & Plan Note (Addendum)
Patient has severe arthritic changes of the neck that likely is secondary to the radiation from patient's squamous cell.    Patient has been doing gabapentin 300 mg and seem to be doing relatively well.  We did discuss the possibility of a muscle relaxer.  Patient has not as well as anti-inflammatories.  We can refill if necessary.  We also discussed that with patient having some mild difficulty with his sometimes picking up a glass disease secondary to more weakness.  Due to patient's also history of the squamous cell carcinoma we will get MRI of the neck to further evaluate due to the severity of the arthritis.  Hopefully patient will continue to do well though follow-up with me again 2 to 3 months but if patient does have an MRI showing nerve impingement patient could be a candidate for epidurals.  Spent greater than 30 minutes discussing with patient, looking at previous imaging, and discussing different medications.

## 2020-07-06 ENCOUNTER — Ambulatory Visit (INDEPENDENT_AMBULATORY_CARE_PROVIDER_SITE_OTHER): Payer: Medicare HMO | Admitting: Internal Medicine

## 2020-07-06 ENCOUNTER — Encounter: Payer: Self-pay | Admitting: Internal Medicine

## 2020-07-06 ENCOUNTER — Ambulatory Visit
Admission: RE | Admit: 2020-07-06 | Discharge: 2020-07-06 | Disposition: A | Discharge status: Home / Self Care | Payer: Medicare HMO | Source: Ambulatory Visit | Attending: Family Medicine | Admitting: Family Medicine

## 2020-07-06 ENCOUNTER — Other Ambulatory Visit: Payer: Self-pay

## 2020-07-06 VITALS — BP 132/78 | HR 67 | Temp 98.0°F | Resp 16 | Ht 70.0 in | Wt 184.0 lb

## 2020-07-06 DIAGNOSIS — M542 Cervicalgia: Secondary | ICD-10-CM

## 2020-07-06 DIAGNOSIS — M5030 Other cervical disc degeneration, unspecified cervical region: Secondary | ICD-10-CM

## 2020-07-06 DIAGNOSIS — I10 Essential (primary) hypertension: Secondary | ICD-10-CM | POA: Diagnosis not present

## 2020-07-06 DIAGNOSIS — E039 Hypothyroidism, unspecified: Secondary | ICD-10-CM | POA: Diagnosis not present

## 2020-07-06 DIAGNOSIS — I712 Thoracic aortic aneurysm, without rupture, unspecified: Secondary | ICD-10-CM

## 2020-07-06 DIAGNOSIS — E785 Hyperlipidemia, unspecified: Secondary | ICD-10-CM | POA: Diagnosis not present

## 2020-07-06 DIAGNOSIS — M4802 Spinal stenosis, cervical region: Secondary | ICD-10-CM | POA: Diagnosis not present

## 2020-07-06 MED ORDER — ROSUVASTATIN CALCIUM 20 MG PO TABS: 20.0000 mg | ORAL_TABLET | Freq: Every day | ORAL | 1 refills | Status: DC

## 2020-07-06 NOTE — Progress Notes (Signed)
* * *        **  [name] [name], [name] [name]**    --- ---    48 Y old Male, DOB: Oct 11, 1950    [address] [place], [place] [address]    Home: [phone]    Provider: [name]        * * *    Telephone Encounter    ---    Answered by   Pryor Curia  Date: 11/19/2017         Time: 11:39 AM    Caller   pt    --- ---            Reason   Needs call back from MD            Message                      pt requesting a call back regarding blood work                 Action Taken                      [name],[name]  11/19/2017 11:40:59 AM > can you pls see if results are in thx JO       [name],[name]  11/20/2017 9:32:39 AM > lab results are in patient docs      [name]  11/20/2017 9:36:27 AM > lab= 32-       [name],[name]  11/20/2017 10:19:12 AM > Pt is awaiting call back in regards to resutls.       [name]  11/20/2017 10:35:19 AM > was on 530/day now at 500- go to 400 mg/day JO      [name]  11/20/2017 10:36:31 AM > [name] please book repeat dilantin level Friday G40.909 may need to print this / I can sign then fax thanks JO       [name],[name]  11/22/2017 9:40:19 AM > left in your door to be signed.      fax [phone]      [name]  11/22/2017 11:51:07 AM > signed [name]                    * * *                ---          * * *          Patient: [name] [name], [name] [name] DOB: 11/15/1950 Provider: [name] 11/19/2017    ---    Note generated by eClinicalWorks EMR/PM Software ([email or web address])
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
## 2020-07-06 NOTE — Progress Notes (Signed)
* * *        **[name] [name], [name] [name]**    --- ---    70 Y old Male, DOB: [birth year], External MRN: [id number]    Account Number: [id number]    [address], [place], [place]    Home: [phone]    Insurance: [id number]    PCP: Alessandra Grout, MD Referring: Alessandra Grout, MD    Appointment Facility: Neurology        * * *    03/21/2017  Progress Notes: Lewis MoccasinCHN#:** [id number]    --- ---    ---        Reason for Appointment    ---      1\. F/U    ---      History of Present Illness    ---     _GENERAL_ :    Dear Dr Marin Shutter : Many thanks for referring [name] [name] to [facility]. His histoy has been extensively outlined- he has presumptive  complex partial seizures/ has been stable for quite some time although a few  weeks ago mentioned he found a picture moved in his home, soreness in his  anterior stomach/ wondering if he had a seizure or not/moved that picture and  so forth, but otherwise stable, no issues, complaints, has refills, wants lab  work, takes seizure/safety precautions.      Current Medications    ---    Taking     * Aspirin 81 MG Tablet Chewable 1 tablet Orally Once a day    ---    * Atorvastatin Calcium 10 MG Tablet 1 tablet Orally Once a day    ---    * CoQ10     ---    * Dilantin 100 mg Capsule TAKE 4 CAPSULES BY MOUTH EVERY MORNING WITH THE 30MG  Orally- NAME BRAND DAW MEDICALLY NECESSARY , Notes: 460 mg/day    ---    * Omeprazole 20 MG Capsule Delayed Release 1 capsule Orally Once a day    ---    * Potassium Chloride 20 MEQ Packet 1 packet with food Orally Once a day    ---    * Medication List reviewed and reconciled with the patient    ---      Past Medical History    ---       See notes- seizures/cardiovascular disease.        ---    Seizures.        ---      Surgical History    ---      No Surgical History documented.    ---      Family History    ---      No FH seizures.    ---      Social History    ---    Tobacco    history: _Never  smoked_   no alcohol or illicits.    ---      Allergies    ---      N.K.D.A.    ---      Hospitalization/Major Diagnostic Procedure    ---      No Hospitalization History.    ---      Review of Systems    ---     _GENERAL_ :    Outpatient questionnaire reviewed. Constitutional No concerns.  Hematologic/Lymphatic No lumps in the neck, lumps in the groin, gingival  bleeding,  epistaxis. Negative for: negative . Positive for: negative . Eyes No  concerns. ENT negative . Cardiovascular No concerns. Respiratory No concerns.  Gastrointestinal No concerns.          Vital Signs    ---    Pain scale 0, Ht-in 5 ft 10 in.      Physical Examination    ---    General appearance is that of a well-appearing, well-groomed, Caucasian male  in no physical distress. Skin is without rash. HEENT is normal. The neck is  supple. Chest is clear to auscultation and abdomen is benign. Limbs are  without edema. He is awake and attentive. He uses language normally and there  is no visuospatial neglect. Conversational skills are normal. Memory is intact  to word recall, recent events, and past medical history. Affect is euthymic.  Cranial nerves are normal. Visual fields and eye movements are full. Pupils  are equal and reactive. Face is symmetric and tongue midline. Muscle bulk,  power, and tone are normal. Reflexesnormal and symmetric bilaterally, no ankle  clonus, plantar responses downgoing bilaterally. CoordinationFinger to nose  intact bilaterally,    Romberg Negative good postural reflexes    Gait:baseline- has an antalgic gait of long standing.      Assessments    ---    1\. Seizure disorder - G40.909 (Primary)    ---      Impression is that of a very pleasant 70 year old male with complex partial  seizures. While remaining on brand name Dilantin 100 mg (4) tablets daily with  which he reports being 100% adherent and with no reported side effects, there  have no further spells concerning for seizure except as noted above which  sounds  unclear. No changes are made to his Dilantin today. Labs and levels  will be checked. Otherwise as long as he remains stable 6 month followup was  recommended or sooner if his condition was to change or any problems were to  arise.    Thank you for the opportunity help in the care of this patient.    ---      Treatment    ---       **1\. Seizure disorder**    _LAB: Phenytoin (Dilantin)_    _LAB: COMP METABOLIC PANEL W/ADJ CALCIUM, PLASMA_    _LAB: CBC (H/H, RBC, INDICES, WBC, PLT)_    _LAB: Liver Function Tests (LFT)_    Notes: seizure/safety/driving precautions reviewed.    ---      Follow Up    ---    6 Months    Electronically signed by [name] MD on 03/21/2017 at 12:37 PM EST    Sign off status: Completed        * * *        Neurology    [address]    [place], [place] [address]    Tel: [phone]    Fax: [phone]              * * *          Patient: [name] [name], [name] [name] DOB: 1950/09/16 Progress Note: [name] [name] 03/21/2017    ---    Note generated by eClinicalWorks EMR/PM Software ([email or web address])
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
## 2020-07-06 NOTE — Patient Instructions (Signed)

## 2020-07-06 NOTE — Progress Notes (Signed)
Subjective:  Patient ID: Brandon [name], male    DOB: [birth year]  Age: 70 y.o. MRN: [id number]  CC: Hypertension and Hypothyroidism  This visit occurred during the SARS-CoV-2 public health emergency.  Safety protocols were in place, including screening questions prior to the visit, additional usage of staff PPE, and extensive cleaning of exam room while observing appropriate contact time as indicated for disinfecting solutions.    HPI Brandon [name] presents for f/up -  He stopped taking indapamide because it caused dizziness.  He monitors his blood pressure daily and the numbers are always less than 130/80.  The dizziness has resolved.  He is active and denies any recent episodes of chest pain, shortness of breath, palpitations, edema, fatigue, or diaphoresis.  Outpatient Medications Prior to Visit  Medication Sig Dispense Refill  . aspirin 81 MG tablet Take 81 mg by mouth daily.    . candesartan (ATACAND) 32 MG tablet TAKE 1 TABLET (32 MG TOTAL) BY MOUTH DAILY. 90 tablet 1  . carvedilol (COREG) 6.25 MG tablet TAKE 1 TABLET BY MOUTH 2 TIMES DAILY WITH A MEAL 180 tablet 1  . gabapentin (NEURONTIN) 300 MG capsule Take 1 capsule (300 mg total) by mouth at bedtime. 90 capsule 1  . levothyroxine (SYNTHROID) 100 MCG tablet TAKE 1 TABLET BY MOUTH EVERY DAY 90 tablet 1  . Magnesium Oxide 250 MG TABS Take 1 tablet (250 mg total) by mouth 2 (two) times daily. 180 tablet 1  . omeprazole (PRILOSEC) 40 MG capsule TAKE 1 CAPSULE BY MOUTH EVERY DAY 90 capsule 1  . thiamine 100 MG tablet TAKE 1/2 TABLET DAILY 45 tablet 1  . zolpidem (AMBIEN) 10 MG tablet TAKE 1 TABLET BY MOUTH EVERY DAY AT BEDTIME AS NEEDED 90 tablet 1  . indapamide (LOZOL) 1.25 MG tablet Take 1 tablet (1.25 mg total) by mouth daily. 90 tablet 0  . rosuvastatin (CRESTOR) 20 MG tablet Take 1 tablet (20 mg total) by mouth daily. 90 tablet 1   No facility-administered medications prior to visit.    ROS Review of Systems   Constitutional: Negative for chills, diaphoresis, fatigue and fever.  HENT: Negative.   Respiratory: Negative.  Negative for cough, chest tightness and shortness of breath.   Gastrointestinal: Negative for abdominal pain, constipation, diarrhea, nausea and vomiting.  Endocrine: Negative for cold intolerance and heat intolerance.  Genitourinary: Negative.   Musculoskeletal: Positive for neck pain.  Skin: Negative for color change and pallor.  Neurological: Negative.  Negative for dizziness and weakness.  Hematological: Negative for adenopathy. Does not bruise/bleed easily.  Psychiatric/Behavioral: Negative.     Objective:  BP 132/78 (BP Location: Right Arm, Patient Position: Sitting, Cuff Size: Large)   Pulse 67   Temp 98 F (36.7 C) (Oral)   Resp 16   Ht 5\' 10"  (1.778 m)   Wt 184 lb (83.5 kg)   SpO2 98%   BMI 26.40 kg/m   BP Readings from Last 3 Encounters:  07/06/20 132/78  06/22/20 130/80  04/22/20 (!) 176/104    Wt Readings from Last 3 Encounters:  07/06/20 184 lb (83.5 kg)  06/22/20 184 lb (83.5 kg)  04/22/20 189 lb (85.7 kg)    Physical Exam Vitals reviewed.  HENT:     Nose: Nose normal.     Mouth/Throat:     Mouth: Mucous membranes are moist.  Eyes:     General: No scleral icterus.    Conjunctiva/sclera: Conjunctivae normal.  Cardiovascular:     Rate and  Rhythm: Normal rate and regular rhythm.     Heart sounds: No murmur heard.   Pulmonary:     Effort: Pulmonary effort is normal.     Breath sounds: No stridor. No wheezing, rhonchi or rales.  Abdominal:     General: Abdomen is flat.     Palpations: There is no mass.     Tenderness: There is no abdominal tenderness.  Musculoskeletal:        General: Normal range of motion.     Cervical back: Neck supple.     Right lower leg: No edema.     Left lower leg: No edema.  Lymphadenopathy:     Cervical: No cervical adenopathy.  Skin:    General: Skin is warm and dry.  Neurological:     General: No  focal deficit present.     Mental Status: He is alert.     Lab Results  Component Value Date   WBC 6.7 04/22/2020   HGB 15.2 04/22/2020   HCT 44.4 04/22/2020   PLT 304.0 04/22/2020   GLUCOSE 97 04/22/2020   CHOL 162 10/22/2019   TRIG 95 10/22/2019   HDL 74 10/22/2019   LDLDIRECT 147.7 12/03/2007   LDLCALC 70 10/22/2019   ALT 20 10/22/2019   AST 25 10/22/2019   NA 139 04/22/2020   K 4.0 04/22/2020   CL 102 04/22/2020   CREATININE 1.16 04/22/2020   BUN 32 (H) 04/22/2020   CO2 28 04/22/2020   TSH 2.97 04/22/2020   PSA 1.2 10/22/2019   INR 1.1 (H) 04/24/2018   HGBA1C 5.5 02/20/2017    No results found.  Assessment & Plan:   Brandon [name] was seen today for hypertension and hypothyroidism.  Diagnoses and all orders for this visit:  Essential hypertension, benign- His blood pressure is adequately well controlled.  Will discontinue indapamide since it caused side effects.  Will continue the current doses of the ARB and beta-blocker.  Hyperlipidemia with target LDL less than 130- He has achieved his LDL goal is doing well on the statin. -     rosuvastatin (CRESTOR) 20 MG tablet; Take 1 tablet (20 mg total) by mouth daily.  Thoracic aortic aneurysm without rupture (HCC) -     rosuvastatin (CRESTOR) 20 MG tablet; Take 1 tablet (20 mg total) by mouth daily.  Acquired hypothyroidism- His TSH is in the normal range and clinically he is euthyroid.   I have discontinued Brandon Griffes. Tidwell "Skip"'s indapamide. I am also having him maintain his aspirin, carvedilol, thiamine, omeprazole, Magnesium Oxide, candesartan, gabapentin, zolpidem, levothyroxine, and rosuvastatin.  Meds ordered this encounter  Medications  . rosuvastatin (CRESTOR) 20 MG tablet    Sig: Take 1 tablet (20 mg total) by mouth daily.    Dispense:  90 tablet    Refill:  1     Follow-up: Return in about 4 months (around 11/05/2020).  Scarlette Calico, MD

## 2020-07-06 NOTE — Progress Notes (Signed)
* * *        **[name] [name], [name] [name]**    --- ---    47 Y old Male, DOB: [birth year]    [address], [place], [place] [address]    Home: [phone]    Provider: [name]        * * *    Telephone Encounter    ---    Answered by   [name]  Date: 11/23/2017         Time: 04:02 PM    Reason   high dilantin level    --- ---            Action Taken                      Somaya Grassi  11/23/2017 4:08:03 PM > Dilantin level = 28.6 - on 400 mg/day- will go to 330 mg/day - recheck level on monday- spoke with him thx [name]  11/28/2017 10:06:21 AM > no results in [facility].  Called patient at the number above.  Left a message asking for a call back.        Frimpong,Eric  11/28/2017 10:45:18 AM > Hi, patient is returning your call please call him again at [phone].       McIntosh,[name]  11/28/2017 11:35:10 AM > PT called again, the pt requested a text or email message. Tele: [phone] and email: [email or web address] .Renford Dills  11/28/2017 11:54:38 AM > patient had blood work done on Monday at [facility] in [place] at 10:30am  took his Dilantin 330mg  at 7:45am that morning.  called [facility]  Dilantin level      reported as 23.6. report being faxed again. My understanding from Dr. [name]. the on call md will manage as he is on vacation.        Darinda [place]  11/29/2017 9:26:26 AM > spoke with him feeling ok - rechecking level tomorrow will touch base tomorrow afternoon or later Junious Silk  11/30/2017 4:59:14 PM > spoke with him- had level drawn, but feels good, wants to stay at 330 mg /day diilantin, reassess next week, will get another blood draw Gretta Arab  12/03/2017 3:38:14 PM > can you pls let patient know- still no Dilantin level received, was checking in- if can be located pls attach / can send me back bean will check again this week many thanks JO      [name],[name]  12/04/2017 11:28:14 AM > called and he said that he will wait for your call back & he will make sure labs get  sent. thanks      Aedin Jeansonne  12/04/2017 6:56:50 PM > ok still no labs received- will cb when received thx JO      Sayaka Hoeppner  12/06/2017 7:42:18 AM >       Whit Bruni  12/06/2017 7:42:19 AM > Corrie Dandy- can you pls track this Dilantin level down/we can review plan if needed- if level ok he should simply stay where he is repeat in 2 weeks ok = (10-20 level) thx [name]  12/07/2017 9:10:16 AM > Please contact lab where patient gets drawn to have results sent to [place] and then send bean back to me   tx.       [name],[name]  12/07/2017 9:46:11 AM >       Charyl Dancer  12/07/2017 10:16:20 AM > level is 15  informed patient.  He will get another Dilantin level in 2 weeks.                      * * *                ---          * * *          Patient: [name] [name], [name] [name] DOB: 1950/08/08 Provider: [name] 11/23/2017    ---    Note generated by eClinicalWorks EMR/PM Software ([email or web address])

## 2020-07-06 NOTE — Progress Notes (Signed)
* * *        **  [name], [name] [name]**    --- ---    74 Y old Male, DOB: 30-Dec-1950    [address], [place], [place] [address]    Home: [phone]    Provider: [name]        * * *    Telephone Encounter    ---    Answered by   Magdalene Patricia  Date: 11/12/2017         Time: 04:09 PM    Caller   [name] [name]    --- ---            Reason   Not feeling well/Dilantin            Message                      Hi,             [name] [name], PT is requesting a CB from Dr. [name]. He stated that he's feeling a bit off and would like to know if he needs to see Dr. [name] sooner than his upcoming APPT on 03/18/18. He's requesting to speak with someone before his APPT. He stated that he might need to adjust his dilantin level , but want to speak with Dr. [name] before making that decision.             Thank you,                 Action Taken                      [facility]  11/12/2017 4:13:09 PM >       Oyindamola Key  11/13/2017 8:06:42 AM > Corrie Dandy- we have been in this cycle with this patient many times with erratic dilantin levels- perhaps we might discuss changing from dilantin at some point, but ok to get levels/triage/and so forth thanks [name]  11/14/2017 2:03:16 PM > called the patient at the number above.  reports he has been  feeling foggy and unsteady.  was taking 530mg  Dilantin daily until this morning when he decreased his dose to 500mg   He can go to [facility] this afternoon for blood work.  Order prepped, being faxed.                      * * *              * * *        ---        Reason for Appointment    ---      1\. Not feeling well/Dilantin    ---      Assessments    ---    1\. Seizure disorder - G40.909    ---      Treatment    ---       **1\. Seizure disorder**    _LAB: Phenytoin (Dilantin)_     Dilantin level PRN as indicated please fax  results to [phone]    --- ---          * * *          Patient: [name], [name] [name] DOB: October 20, 1950 Provider: [name] 11/12/2017    ---    Note  generated by eClinicalWorks EMR/PM Software ([email or web address])

## 2020-07-06 NOTE — Progress Notes (Signed)
* * *        **  [name] [name], [name] [name]**    --- ---    13 Y old Male, DOB: [birth year]    [address], [place], [place] [address]    Home: [phone]    Provider: [name]        * * *    Telephone Encounter    ---    Answered by   Charyl Dancer  Date: 07/06/2017         Time: 12:52 PM    Caller   [name]    --- ---            Reason   Meds / Appointment            Message                      Hi,       Patient says he needs to see Dr. [name] as soon as possible because his medication needs adjustment, he also mentioned that his Dilantin is very low. Please call him back at [phone] to schedule his appointment.      Thank you                 Action Taken                      Frimpong,Eric  07/06/2017 12:55:29 PM >       [name],[name]  07/06/2017 1:07:09 PM > nothing open will fwd to dr to assist. thanks Dez.      Georgia Baria  07/07/2017 6:57:56 AM > Corrie Dandy- can you pls triage and we can discuss- wht exactly is his dilantin level- this happens numerouns times- did he forget some meds?Ardith Dark- not sure an appointment will help but let's try to triage and then after we speak he can have a follow up and then we can delegate to [facility] to pls book with a resident on a Tuesday or a Wedneday with a resident or next  avail if truly emergent but we have cycled through this very issue with him numerous times over the course of his care from [facility] to [facility] under my care Junious Silk  07/09/2017 8:58:42 AM > spoke with him level - >10- let's add in those days I emailed some  days- advised no driving/ JO       [name],[name]  07/09/2017 9:40:36 AM >                     * * *                ---          * * *          Patient: [name] [name], [name] [name] DOB: Jun 14, 1950 Provider: [name] 07/06/2017    ---    Note generated by eClinicalWorks EMR/PM Software ([email or web address])

## 2020-07-06 NOTE — Progress Notes (Signed)
* * *        **  Rosal, Nafis**    --- ---    87 Y old Male, DOB: Aug 16, 1950    [address] [place], [place] [address]    Home: [phone]    Provider: Floydene Flock        * * *    Telephone Encounter    ---    Answered by   Bernie Covey  Date: 01/22/2017         Time: 11:12 AM    Caller   Patient    --- ---            Reason   RX Refill            Message                      Good morning,            Patient is requesting that his medication: Dilantin 100 MG Capsule, be refilled and be sent to the verified pharmacy listed above.  Patients would like to be emailed [email or web address])or contacted at number: [phone] once the refill is ready.            Thank you.                Action Taken   Rose,Tanna 01/22/2017 11:14:15 AM > Wood,Mary 01/24/2017  11:10:32 AM > this was sent to pharmacy 10/17                * * *                ---          * * *          Patient: Crossin, Isaiyah DOB: 09-Jan-1951 Provider: Floydene Flock 01/22/2017    ---    Note generated by eClinicalWorks EMR/PM Software ([email or web address])

## 2020-07-06 NOTE — Progress Notes (Signed)
* * *        **  Inlow, Amare**    --- ---    60 Y old Male, DOB: 08/31/1950    [address], [place], [place] [address]    Home: [phone]    Provider: [name]        * * *    Telephone Encounter    ---    Answered by   Diannia Ruder  Date: 03/13/2018         Time: 02:37 PM    Reason   appointment    --- ---            Message                      Patient called and stated he was scheduled for 12/09 and was told the doctor would not be here in clinic so he is very confused and would like clarification on this matter.                Action Taken                      Villegas,Alexa  03/13/2018 2:37:14 PM >      Leighton Roach  03/14/2018 9:00:59 AM > pt is calling again with the same question.Please call him today      [name],[name]  03/14/2018 12:54:07 PM > called and spoke with patient was getting reminders for appt that had already been cx. all set now. thanks Dez                    * * *                ---          * * *          Patient: Haefele, Erland DOB: Sep 18, 1950 Provider: [name] 03/13/2018    ---    Note generated by eClinicalWorks EMR/PM Software ([email or web address])

## 2020-07-06 NOTE — Progress Notes (Signed)
* * *        **  [name], [name]**    --- ---    30 Y old Male, DOB: March 20, 1951    [address], [place], [place] [address]    Home: [phone]    Provider: [name]        * * *    Telephone Encounter    ---    Answered by   [name]  Date: 11/22/2017         Time: 02:11 PM    Caller   patient    --- ---            Reason   Dilantin level high was at 32            Action Taken                      [name]  11/22/2017 2:12:02 PM > was to get another level end of week- can you pls follow up with him [name]  11/22/2017 3:46:09 PM > Patient called again to see why the order was not faxed. He stated other docuents were fax but not the order to get his Dilantin levels tested. Please fax order to [facility] [place]. He didn't have the fax number in hand while on the phone but he does have it, so if for some reason the office doesn't have it anymore call the patient. He asked to please call him once order has been fax, please do it before 5pm today so he doesn't go to tomorrows appointment for no reason. Thank you !       [name],[name]  11/23/2017 7:29:31 AM > sent this over 3 times now. called lab to get correct fax number. called patient and let him know we sent again. thanks Dez                    * * *                ---          * * *          Patient: [name] [name], [name] [name] DOB: [birth year] Provider: [name] 11/22/2017    ---    Note generated by eClinicalWorks EMR/PM Software ([email or web address])

## 2020-07-06 NOTE — Progress Notes (Signed)
* * *        **Campoli, Leah**    --- ---    69 Y old Male, DOB: 22-Jun-1950    [address], [place], [place] [address]    Home: [phone]    Provider: [name]        * * *    Telephone Encounter    ---    Answered by   Charyl Dancer  Date: 07/27/2017         Time: 01:43 PM    Caller   [name]    --- ---            Reason   Medication            Message                      Hi,      Patient says his  medication was supposed to be updated to 500mg   a day (Dilantin 100 mg Capsule) he just picked  it up from the pharmacy and it has not been updated. Please have Dr. [name] make the changes and call patient back at [phone].      Thank you                 Action Taken                      Frimpong,Eric  07/27/2017 1:48:04 PM > Rekha Hobbins      Dorlene Footman  07/27/2017 5:54:40 PM > I increased to 400 mg /day for now since level was excellent- 400 + the 30's I put through should give him enough to increase if follow up levels suggest that needs to be the case/ pls let him know many thanks J O      Solomon,Justin S 07/30/2017 3:10:35 PM > Spoke to Dr. [name] - patient's Dilantin level was fine so he wants the patient to continue on the current dose. I will reach out to the patient to let him know.      Solomon,Justin S 07/30/2017 3:17:10 PM > I called the patient - he says that he had a seizure on 07/02/17 and was taken to a hospital where they increased his dose to 560 mg of Dilantin. His level at that time in the hospital was 10.9. His level here at [facility] on 07/17/17 was 16.2. It is possible this level of 16.2 was a result of his increase in dose to 560 mg because he says he has been taking 560 mg since 07/02/17. I told him I would relay the message to Dr. [name] to see if he would like to send a new prescription for 500 mg a day and get back to him. I put in a script for 500 mg Dilantin a day, please send to pharmacy above if you are OK with this (please select [facility]). Send message back to me if you would like me to get  back in touch with him.                Refills  Continue Dilantin Capsule, 100 mg, Orally- NAME BRAND DAW MEDICALLY  NECESSARY, 450, TAKE 5 CAPSULES BY MOUTH EVERY MORNING WITH THE 30MG , 3  months, Refills=3    --- ---          * * *                ---          * * *  Patient: [name] [name], MICHALEC DOB: [birth year] Provider: [name] 07/27/2017    ---    Note generated by eClinicalWorks EMR/PM Software ([email or web address])

## 2020-07-06 NOTE — Progress Notes (Signed)
* * *        **  [name] [name], [name] [name]**    --- ---    27 Y old Male, DOB: 01/18/1951    [address] [place], [place] [address]    Home: [phone]    Provider: [name]        * * *    Telephone Encounter    ---    Answered by   [name]  Date: 09/12/2017         Time: 02:45 PM    Action Taken                      Madilyne Tadlock  09/12/2017 2:46:01 PM > psl let him know- dilantin level in range 14.1- should continue as he is doing/ see him 6 months many thanks JO       [name],[name]  09/13/2017 8:27:03 AM > called and let him know thanks         --- ---                * * *                ---          * * *          Patient: [name] [name], [name] [name] DOB: 06-Dec-1950 Provider: [name] 09/12/2017    ---    Note generated by eClinicalWorks EMR/PM Software ([email or web address])

## 2020-07-06 NOTE — Progress Notes (Signed)
* * *        **  [name], [name]**    --- ---    80 Y old Male, DOB: [birth year]    [address] [place], [place] [address]    Home: [phone]    Provider: [name]        * * *    Telephone Encounter    ---    Answered by   [name]  Date: 07/31/2017         Time: 08:37 AM    Refills  Refill Dilantin Capsule, 100 mg, Orally- NAME BRAND DAW MEDICALLY  NECESSARY, 450, TAKE 5 CAPSULES BY MOUTH EVERY MORNING WITH THE 30MG , 90 days,  Refills=3    --- ---          * * *                ---          * * *          Patient: [name], [name] DOB: 11/24/1950 Provider: [name] 07/31/2017    ---    Note generated by eClinicalWorks EMR/PM Software ([email or web address])

## 2020-07-06 NOTE — Progress Notes (Signed)
* * *        **  [name] [name], [name] [name]**    --- ---    37 Y old Male, DOB: [birth year]    [address], [place], [place] [address]    Home: [phone]    Provider: [name], MD        * * *    Telephone Encounter    ---    Answered by   [name]  Date: 01/24/2017         Time: 09:22 AM    Refills  Refill Dilantin Capsule, 100 mg, Orally- NAME BRAND DAW MEDICALLY  NECESSARY, 360 Capsule, TAKE 4 CAPSULES BY MOUTH EVERY MORNING WITH THE 30MG ,  90 days, Refills=3    --- ---          * * *                ---          * * *          Patient: [name] [name], [name] [name] DOB: 1951/01/02 Provider: [name], MD 01/24/2017    ---    Note generated by eClinicalWorks EMR/PM Software ([email or web address])

## 2020-07-06 NOTE — Progress Notes (Signed)
* * *        **  [name], [name]**    --- ---    46 Y old Male, DOB: [birth year]    [address], [place], [place] [address]    Home: [phone]    Provider: [name]        * * *    Telephone Encounter    ---    Answered by   [name]  Date: 07/17/2017         Time: 04:06 PM    Action Taken                      Raushanah Osmundson  07/17/2017 4:06:38 PM > patient came to office but learned he was not covered for the visit- he recently had a low Dilantin level/ see phone note, wanted followup so level booked, will que scripts/ refills/ all questions answered, will determine how to proceed about coverage for further appointments/ he will cb PRN [name]         --- ---            Refills  Refill Dilantin Capsule, 100 mg, Orally- NAME BRAND DAW MEDICALLY  NECESSARY, 360 Capsule, TAKE 4 CAPSULES BY MOUTH EVERY MORNING WITH THE 30MG ,  90 days, Refills=3    --- ---      Start Dilantin Capsule, 30 mg, Orally, 90, as directed, sig up to 3 tablets  daily may undergo titration, 1 month, Refills=6          * * *                ---          * * *          Patient: [name], [name] DOB: [birth year] Provider: [name] 07/17/2017    ---    Note generated by eClinicalWorks EMR/PM Software ([email or web address])

## 2020-07-06 NOTE — Progress Notes (Signed)
* * *        **  [name], [name]**    --- ---    39 Y old Male, DOB: [birth year]    [address] [place], [place] [address]    Home: [phone]    Provider: [name]        * * *    Telephone Encounter    ---    Answered by   [name]  Date: 11/23/2017         Time: 08:16 AM    Reason   lab order    --- ---            Message                      Hello      lab will not accept order we sent over. they state it needs to be a order cannot have other notes on it. please add labs under LAB/DI tab in this note and i can print form there. patient plans to go in a hour or so.      thanks Hinton Lovely      [phone]                Action Taken                      [name],[name]  11/23/2017 8:30:14 AM > taken care of. thanks                    * * *                ---          * * *          Patient: [name] [name], [name] [name] DOB: 21-Dec-1950 Provider: [name] 11/23/2017    ---    Note generated by eClinicalWorks EMR/PM Software ([email or web address])

## 2020-07-06 NOTE — Progress Notes (Signed)
* * *        **[name] [name], [name] [name]**    --- ---    70 Y old Male, DOB: 1950-12-18, External MRN: [id number]    Account Number: [id number]    [address] [place], [place]    Home: [phone]    Insurance: [id number] PPO    PCP: Alessandra Grout, MD Referring: Alessandra Grout, MD    Appointment Facility: Neurology        * * *    09/12/2017  Progress Notes: [name] **CHN#:** [id number]    --- ---    ---         **Reason for Appointment**    ---       1\. RV    ---       **History of Present Illness**    ---     _GENERAL_ :    Dear Dr Marin Shutter : Many thanks for referring [name] [name] to [facility]  Neurology. He is a 70 year old patient with seizures. His histoy has been  extensively outlined- he has presumptive complex partial seizures/ has been  stable for quite some time although from time to time he has breakthrough  events- but ad interim states there are no issues, complaints, has refills,  wants lab work after discussion, takes seizure/safety precautions previously  extensively reviewed.       **Current Medications**    ---    Taking     * Aspirin 81 MG Tablet Chewable 1 tablet Orally Once a day    ---    * Atorvastatin Calcium 10 MG Tablet 1 tablet Orally Once a day    ---    * CoQ10     ---    * Dilantin 30 mg Capsule as directed Orally sig up to 3 tablets daily may undergo titration    ---    * Dilantin 100 mg Capsule TAKE 5 CAPSULES BY MOUTH EVERY MORNING WITH THE 30MG  Orally- NAME BRAND DAW MEDICALLY NECESSARY , Notes: 560 mg/day    ---    * Omeprazole 20 MG Capsule Delayed Release 1 capsule Orally Once a day    ---    * Potassium Chloride 20 MEQ Packet 1 packet with food Orally Once a day    ---    * Medication List reviewed and reconciled with the patient    ---       **Past Medical History**    ---       See notes- seizures/cardiovascular disease.        ---    Seizures.        ---       **Surgical History**    ---       No Surgical History documented.    ---        **Family History**    ---       No FH seizures.    ---       **Social History**    ---    Tobacco    history: _Never smoked_   no alcohol or illicits.    ---       **Allergies**    ---       N.K.D.A.    ---       **Hospitalization/Major Diagnostic Procedure**    ---       No Hospitalization History.    ---       **Review of Systems**    ---  _GENERAL_ :    Outpatient questionnaire reviewed.          **Vital Signs**    ---    Pain scale 0, Ht-in 5 ft 10 in.       **Physical Examination**    ---     _GENERAL_ :    General Appearance: unremarkable, Looks Healthy, well-developed, well-  hydrated, well-nourished, well-groomed, No dysmorphic features, no acute  distress, alert and oriented, pleasant.    Mood/Affect: pleasant.    HEENT Normocephalic, atraumatic. Sclera clear, conjunctiva pink.    Neck No carotid Bruits, supple.    Skin Normal.    Cardiovascular no carotid bruits, no carotid bruits, heart regular rate and  rhythm, extremities warm without swelling.    Lungs Clear to auscultation bilaterally, No Wheezes, ronchi, rales.    Extremities There was no clubbing, cyanosis or edema noted.    MSE : A/O    CN EOMI PERRLA    Motor 5/5 ambulatory.       **Assessments**    ---    1\. Seizure disorder - G40.909 (Primary)    ---      70 year old with extensively evaluated seizure disorder- recently medication  adjustment/ see chart/ previosuly extensive risk management discussions, all  questions answered, Seizure/safey/ precautions needs to follow RMV guidelines,  Dilantin level 6 month follow up [name].    ---       **Treatment**    ---       **1\. Seizure disorder**    _LAB: Phenytoin (Dilantin)_    ---       **Follow Up**    ---    6 Months    Electronically signed by [name] , MD on 09/12/2017 at 01:46 PM EDT    Sign off status: Completed        * * *        Neurology    [address]    [place], [address]    [place], [place] [address]    Tel: [phone]    Fax: [phone]              * * *           Patient: [name] [name], [name] [name] DOB: Jul 21, 1950 Progress Note: [name] [name] 09/12/2017    ---    Note generated by eClinicalWorks EMR/PM Software ([email or web address])

## 2020-07-06 NOTE — Progress Notes (Signed)
* * *        **  [name], [name]**    --- ---    65 Y old Male, DOB: 1950-08-15    [address], [place], [place] [address]    Home: [phone]    Provider: [name], MD        * * *    Telephone Encounter    ---    Answered by   [name]  Date: 03/21/2017         Time: 02:59 PM    Action Taken   [name] , MD 03/21/2017 2:59:47 PM > pls let him know-  bloodwork - excellent dilantin in 18 range, has some non specifically elevated  liver functions/ not serious/ would like to send/ please send all his labs to  the PCP Dr Marin Shutter / happy to re-evaluate him in 6 months and or if/as needed  JO [name],[name] 03/22/2017 10:46:13 AM > all set let patient know/ thanks  Dez    --- ---                * * *                ---          * * *          Patient: [name], [name] DOB: Jun 16, 1950 Provider: [name], MD 03/21/2017    ---    Note generated by eClinicalWorks EMR/PM Software ([email or web address])

## 2020-07-06 NOTE — Progress Notes (Signed)
* * *        **  [name], [name]**    --- ---    34 Y old Male, DOB: May 28, 1950    [address], [place], [place] [address]    Home: [phone]    Provider: [name]        * * *    Telephone Encounter    ---    Answered by   [name]  Date: 07/18/2017         Time: 06:58 AM    Action Taken                      [name]  07/18/2017 6:58:41 AM > pls let him know- dilantin level- excellent at 16- let's repeat in 1 month-2 month range many thanks JO       [name],[name]  07/18/2017 8:20:24 AM > wants to know if you are going to be uping his Dilantin medication.       thanks Page Spiro  07/18/2017 8:31:46 AM > he should take what he is taking- level is ok/ did he miss some doses accounting for low level previously ? JO      [name],[name]  07/18/2017 9:34:18 AM > he say that yoy guys disscused increasing his dose. but i will let him know you want to keep the same dose. thanks Page Spiro  07/18/2017 9:43:40 AM > I wrote script so he can take higher 30 mg tablets on Dilantin- but am concerned level = perfect after we focussed on all this- so am wondering if he missed doses or not/ we can increase + 30 mg if he wants/ wrote scripts so he CAN do that if he wishes thx [name]  07/18/2017 9:45:25 AM > okay will let patient know. thanks Dez        --- ---                * * *                ---          * * *          Patient: Lona, Johnatha DOB: 05-13-1950 Provider: [name] 07/18/2017    ---    Note generated by eClinicalWorks EMR/PM Software ([email or web address])

## 2020-07-06 NOTE — Progress Notes (Signed)
* * *        **  [name] [name], [name] [name]**    --- ---    80 Y old Male, DOB: 1950/07/10    [address], [place], [place] [address]    Home: [phone]    Provider: [name]        * * *    Telephone Encounter    ---    Answered by   Loel Lofty  Date: 12/21/2017         Time: 08:18 AM    Caller   patient    --- ---            Reason   call back            Message                      Good Morning,      Patient received a call from Charyl Dancer and he is returning her call. Patient can be reached at [phone], thank you.                 Action Taken                      [facility]  12/21/2017 8:19:53 AM >       Charyl Dancer  12/21/2017 4:54:18 PM > I did not call the patient.  He was due to get another dilantin level today. Do no have results in chart at this time.  Will follow up on Monday.        [facility]  12/24/2017 9:25:45 AM > called patient at the number provided.  He was calling to see what his dilantin level was that was drawn at [facility] on 9/9  no results in chart.  Called [facility]  Dilantin level 9.1  patient reports he is feeling great, currently taking 330mg  a day.  Will discuss with Dr. [name] and get back to patient.  Per patient ok to leave a VM for him.        Meylin Stenzel  12/25/2017 8:04:09 AM > Issue is we have gone with so many changes back and forth over time- can go to 360 and recheck level next week [name]  12/26/2017 9:49:07 AM > informed patient of instructions.  He will increase dose and will get another level next week.                      * * *                ---          * * *          Patient: [name] [name], [name] [name] DOB: [birth year] Provider: [name] 12/21/2017    ---    Note generated by eClinicalWorks EMR/PM Software ([email or web address])

## 2020-07-06 NOTE — Progress Notes (Signed)
* * *        **Carreiro, Aquarius**    --- ---    70 Y old Male, DOB: June 25, 1950, External MRN: [id number]    Account Number: [id number]    [address], [place], [place]    Home: [phone]    Insurance: [employer]    PCP: Alessandra Grout, MD Referring: Alessandra Grout, MD    Appointment Facility: Neurology        * * *    03/16/2016  Progress Notes: [name] **CHN#:** [id number]    --- ---    ---        Reason for Appointment    ---      1\. OK BY DR [name]    ---      History of Present Illness    ---     _GENERAL_ :    Dear Dr Marin Shutter- This patient is a 70 year old patient- well known to me- who  is due to present today for filling out of RMV forms/ otherwise has no acute  issues/see prior notes for full delineation, added in for brief appointment  today.     _Ambulatory Falls and Injury Prevention_ :    Ambulatory Falls and Injury Prevention    Have you experienced a fall in the past year? _Two or more falls without  injury in the past year_    Is the patient using assistive devices such as a cane or walker? _No_    Do you need assistance with ambulation while at our facility? _No_       Current Medications    ---    Taking     * Aspirin 81 MG Tablet Chewable 1 tablet Orally Once a day    ---    * Atorvastatin Calcium 10 MG Tablet 1 tablet Orally Once a day    ---    * CoQ10     ---    * Dilantin 100 mg Capsule 1 capsule Orally- DAW- DISPENSE AS WRITTEN MEDICALLY NECESSARY 4 pills in the am with 30 mg tablet, Notes: 400 mg/day    ---    * Omeprazole 20 MG Capsule Delayed Release 1 capsule Orally Once a day    ---    * Medication List reviewed and reconciled with the patient    ---      Past Medical History    ---       See notes- seizures/cardiovascular disease.        ---    Seizures.        ---      Surgical History    ---      No Surgical History documented.    ---      Family History    ---      No FH seizures.    ---      Social History    ---    Tobacco    history: _Never  smoked_   no alcohol or illicits.    ---      Allergies    ---      N.K.D.A.    ---      Hospitalization/Major Diagnostic Procedure    ---      No Hospitalization History.    ---      Vital Signs    ---    Pain scale 0, Ht-in 5 ft 10 in, Wt-lbs 197, BMI 28.26, BP 136/89, HR  83, RR  18, Temp 35.9, BSA 2.10, O2 100%, Ht-cm 177.8, Wt-kg 89.36, Wt Change 3 lb.      Physical Examination    ---    full exam deferred but awake, oriented, conversant, ambulatory, seems grossly  baseline.      Assessments    ---    1\. Seizure disorder - G40.909 (Primary)    ---      Patient with seizure disorder here for RMV form filling out/processing, face  to face visit/see above    PLAN : Seizure/safety precautions, no driving unless cleared by RMV/ all  questions answered, forms filled out will be scanned to chart, RV [name].    ---      Follow Up    ---    2 Months    Electronically signed by [name] MD on 03/16/2016 at 03:54 PM EST    Sign off status: Completed        * * *        Neurology    [address]    [place], [place] [address]    Tel: [phone]    Fax: [phone]              * * *          Patient: VOSHON, PETRO DOB: 1950/07/17 Progress Note: Chrishawn Boley 03/16/2016    ---    Note generated by eClinicalWorks EMR/PM Software ([email or web address])

## 2020-07-06 NOTE — Progress Notes (Signed)
* * *        **  [name], [name]**    --- ---    58 Y old Male, DOB: 1951-02-08    [address] [place], [place] [address]    Home: [phone]    Provider: [name]        * * *    Telephone Encounter    ---    Answered by   Charyl Dancer  Date: 11/14/2017         Time: 03:47 PM    Caller   [name]    --- ---            Reason   Blood work            Message                      Hi,      Patient is calling to inform the office that he just got his blood work done at the [facility] in [place] and the results should be in soon. Please call him back at [phone] when the results become available.      Thank you                 Action Taken                      Frimpong,Eric  11/14/2017 3:49:55 PM >       Arlie Riker  11/15/2017 3:35:08 PM > anyway you can try to call to find out what Dilantin is- have not heard from them yet/ or can get lab # from patient thx [name]  11/16/2017 2:12:32 PM > called and let him know he is going to have them resend it to [place] VIA fax i will let you know once we get them. thanks Dez                    * * *                ---          * * *          Patient: [name], [name] DOB: 12-06-1950 Provider: [name] 11/14/2017    ---    Note generated by eClinicalWorks EMR/PM Software ([email or web address])

## 2020-07-07 ENCOUNTER — Other Ambulatory Visit: Payer: Self-pay

## 2020-07-07 ENCOUNTER — Encounter: Payer: Self-pay | Admitting: Family Medicine

## 2020-07-07 DIAGNOSIS — M5030 Other cervical disc degeneration, unspecified cervical region: Secondary | ICD-10-CM

## 2020-07-09 ENCOUNTER — Ambulatory Visit: Admitting: Neurology

## 2020-07-13 DIAGNOSIS — M4722 Other spondylosis with radiculopathy, cervical region: Secondary | ICD-10-CM | POA: Diagnosis not present

## 2020-07-30 ENCOUNTER — Ambulatory Visit: Payer: Medicare HMO

## 2020-08-02 ENCOUNTER — Telehealth: Payer: Self-pay | Admitting: Internal Medicine

## 2020-08-02 DIAGNOSIS — M47812 Spondylosis without myelopathy or radiculopathy, cervical region: Secondary | ICD-10-CM | POA: Diagnosis not present

## 2020-08-02 NOTE — Progress Notes (Signed)
  Chronic Care Management   Outreach Note  08/02/2020 Name: ZAKARI BATHE MRN: [id number] DOB: Jan 27, 1951  Referred by: [name], MD Reason for referral : No chief complaint on file.   An unsuccessful telephone outreach was attempted today. The patient was referred to the pharmacist for assistance with care management and care coordination.   Follow Up Plan:   Carley Perdue [employer] Scheduler

## 2020-08-11 ENCOUNTER — Telehealth: Payer: Self-pay | Admitting: Internal Medicine

## 2020-08-11 NOTE — Progress Notes (Signed)
  Chronic Care Management   Outreach Note  08/11/2020 Name: Brandon [name] MRN: [id number] DOB: 16-Apr-1950  Referred by: [name], MD Reason for referral : No chief complaint on file.   Third unsuccessful telephone outreach was attempted today. The patient was referred to the pharmacist for assistance with care management and care coordination.   Follow Up Plan:   Carley Perdue [employer] Scheduler

## 2020-08-17 DIAGNOSIS — M47812 Spondylosis without myelopathy or radiculopathy, cervical region: Secondary | ICD-10-CM | POA: Diagnosis not present

## 2020-08-18 ENCOUNTER — Telehealth: Payer: Self-pay | Admitting: Internal Medicine

## 2020-08-18 DIAGNOSIS — G4700 Insomnia, unspecified: Secondary | ICD-10-CM | POA: Diagnosis not present

## 2020-08-18 DIAGNOSIS — K219 Gastro-esophageal reflux disease without esophagitis: Secondary | ICD-10-CM | POA: Diagnosis not present

## 2020-08-18 DIAGNOSIS — E785 Hyperlipidemia, unspecified: Secondary | ICD-10-CM | POA: Diagnosis not present

## 2020-08-18 DIAGNOSIS — C8590 Non-Hodgkin lymphoma, unspecified, unspecified site: Secondary | ICD-10-CM | POA: Diagnosis not present

## 2020-08-18 DIAGNOSIS — G8929 Other chronic pain: Secondary | ICD-10-CM | POA: Diagnosis not present

## 2020-08-18 DIAGNOSIS — E663 Overweight: Secondary | ICD-10-CM | POA: Diagnosis not present

## 2020-08-18 DIAGNOSIS — E039 Hypothyroidism, unspecified: Secondary | ICD-10-CM | POA: Diagnosis not present

## 2020-08-18 DIAGNOSIS — I951 Orthostatic hypotension: Secondary | ICD-10-CM | POA: Diagnosis not present

## 2020-08-18 DIAGNOSIS — I2510 Atherosclerotic heart disease of native coronary artery without angina pectoris: Secondary | ICD-10-CM | POA: Diagnosis not present

## 2020-08-18 DIAGNOSIS — I10 Essential (primary) hypertension: Secondary | ICD-10-CM | POA: Diagnosis not present

## 2020-08-18 NOTE — Progress Notes (Signed)
  Chronic Care Management   Outreach Note  08/18/2020 Name: Brandon [name] MRN: [id number] DOB: 01-20-1951  Referred by: [name], MD Reason for referral : No chief complaint on file.   Third unsuccessful telephone outreach was attempted today. The patient was referred to the pharmacist for assistance with care management and care coordination.   Follow Up Plan:   Carley Perdue [employer] Scheduler

## 2020-08-27 ENCOUNTER — Other Ambulatory Visit: Payer: Self-pay | Admitting: Internal Medicine

## 2020-08-27 DIAGNOSIS — K219 Gastro-esophageal reflux disease without esophagitis: Secondary | ICD-10-CM

## 2020-09-13 ENCOUNTER — Other Ambulatory Visit: Payer: Self-pay

## 2020-09-14 ENCOUNTER — Encounter: Payer: Self-pay | Admitting: Internal Medicine

## 2020-09-14 ENCOUNTER — Ambulatory Visit (INDEPENDENT_AMBULATORY_CARE_PROVIDER_SITE_OTHER): Payer: Medicare HMO | Admitting: Internal Medicine

## 2020-09-14 VITALS — BP 142/88 | HR 71 | Temp 97.6°F | Resp 16 | Ht 70.0 in | Wt 179.0 lb

## 2020-09-14 DIAGNOSIS — E519 Thiamine deficiency, unspecified: Secondary | ICD-10-CM

## 2020-09-14 DIAGNOSIS — I10 Essential (primary) hypertension: Secondary | ICD-10-CM

## 2020-09-14 DIAGNOSIS — E039 Hypothyroidism, unspecified: Secondary | ICD-10-CM

## 2020-09-14 LAB — CBC WITH DIFFERENTIAL/PLATELET
Basophils Absolute: 0.1 10*3/uL (ref 0.0–0.1)
Basophils Relative: 1 % (ref 0.0–3.0)
Eosinophils Absolute: 0.2 10*3/uL (ref 0.0–0.7)
Eosinophils Relative: 2.8 % (ref 0.0–5.0)
HCT: 41.7 % (ref 39.0–52.0)
Hemoglobin: 14.2 g/dL (ref 13.0–17.0)
Lymphocytes Relative: 21.6 % (ref 12.0–46.0)
Lymphs Abs: 1.2 10*3/uL (ref 0.7–4.0)
MCHC: 34 g/dL (ref 30.0–36.0)
MCV: 93.7 fl (ref 78.0–100.0)
Monocytes Absolute: 0.7 10*3/uL (ref 0.1–1.0)
Monocytes Relative: 12.1 % — ABNORMAL HIGH (ref 3.0–12.0)
Neutro Abs: 3.5 10*3/uL (ref 1.4–7.7)
Neutrophils Relative %: 62.5 % (ref 43.0–77.0)
Platelets: 304 10*3/uL (ref 150.0–400.0)
RBC: 4.45 Mil/uL (ref 4.22–5.81)
RDW: 15.9 % — ABNORMAL HIGH (ref 11.5–15.5)
WBC: 5.5 10*3/uL (ref 4.0–10.5)

## 2020-09-14 LAB — BASIC METABOLIC PANEL
BUN: 27 mg/dL — ABNORMAL HIGH (ref 6–23)
CO2: 22 mEq/L (ref 19–32)
Calcium: 8.9 mg/dL (ref 8.4–10.5)
Chloride: 107 mEq/L (ref 96–112)
Creatinine, Ser: 1.06 mg/dL (ref 0.40–1.50)
GFR: 72.15 mL/min (ref >60.00)
Glucose, Bld: 93 mg/dL (ref 70–99)
Potassium: 3.7 mEq/L (ref 3.5–5.1)
Sodium: 142 mEq/L (ref 135–145)

## 2020-09-14 LAB — MAGNESIUM: Magnesium: 1.2 mg/dL — ABNORMAL LOW (ref 1.5–2.5)

## 2020-09-14 LAB — TSH: TSH: 1.42 u[IU]/mL (ref 0.35–4.50)

## 2020-09-14 MED ORDER — MAGNESIUM OXIDE 400 MG PO CAPS: 1.0000 | ORAL_CAPSULE | Freq: Two times a day (BID) | ORAL | 0 refills | Status: DC

## 2020-09-14 NOTE — Progress Notes (Signed)
Subjective:  Patient ID: Brandon [name], male    DOB: Jan 12, 1951  Age: 70 y.o. MRN: [id number]  CC: Hypertension and Hypothyroidism  This visit occurred during the SARS-CoV-2 public health emergency.  Safety protocols were in place, including screening questions prior to the visit, additional usage of staff PPE, and extensive cleaning of exam room while observing appropriate contact time as indicated for disinfecting solutions.    HPI CRAY MONNIN presents for f/up -  He tells me his blood pressure at home is well controlled.  He is recently had systolic blood pressures of 117, 135, and 124.  He is active and denies any recent episodes of chest pain, shortness of breath, diaphoresis, dizziness, or lightheadedness.  Outpatient Medications Prior to Visit  Medication Sig Dispense Refill   aspirin 81 MG tablet Take 81 mg by mouth daily.     candesartan (ATACAND) 32 MG tablet TAKE 1 TABLET (32 MG TOTAL) BY MOUTH DAILY. 90 tablet 1   carvedilol (COREG) 6.25 MG tablet TAKE 1 TABLET BY MOUTH 2 TIMES DAILY WITH A MEAL 180 tablet 1   gabapentin (NEURONTIN) 300 MG capsule Take 1 capsule (300 mg total) by mouth at bedtime. 90 capsule 1   levothyroxine (SYNTHROID) 100 MCG tablet TAKE 1 TABLET BY MOUTH EVERY DAY 90 tablet 1   omeprazole (PRILOSEC) 40 MG capsule TAKE 1 CAPSULE BY MOUTH EVERY DAY 90 capsule 1   rosuvastatin (CRESTOR) 20 MG tablet Take 1 tablet (20 mg total) by mouth daily. 90 tablet 1   thiamine 100 MG tablet TAKE 1/2 TABLET DAILY 45 tablet 1   zolpidem (AMBIEN) 10 MG tablet TAKE 1 TABLET BY MOUTH EVERY DAY AT BEDTIME AS NEEDED 90 tablet 1   Magnesium Oxide 250 MG TABS Take 1 tablet (250 mg total) by mouth 2 (two) times daily. 180 tablet 1   No facility-administered medications prior to visit.    ROS Review of Systems  Constitutional:  Negative for diaphoresis and fatigue.  HENT: Negative.    Eyes: Negative.   Respiratory:  Negative for cough, chest tightness, shortness of  breath and wheezing.   Cardiovascular:  Negative for chest pain, palpitations and leg swelling.  Gastrointestinal:  Negative for abdominal pain, blood in stool, constipation, diarrhea, nausea and vomiting.  Endocrine: Negative.   Genitourinary: Negative.  Negative for difficulty urinating and dysuria.  Musculoskeletal: Negative.  Negative for arthralgias and myalgias.  Skin: Negative.  Negative for color change.  Neurological: Negative.  Negative for dizziness, weakness and light-headedness.  Hematological:  Negative for adenopathy. Does not bruise/bleed easily.  Psychiatric/Behavioral: Negative.     Objective:  BP (!) 142/88 (BP Location: Left Arm, Patient Position: Sitting, Cuff Size: Large)   Pulse 71   Temp 97.6 F (36.4 C) (Oral)   Resp 16   Ht 5\' 10"  (1.778 m)   Wt 179 lb (81.2 kg)   SpO2 98%   BMI 25.68 kg/m   BP Readings from Last 3 Encounters:  09/14/20 (!) 142/88  07/06/20 132/78  06/22/20 130/80    Wt Readings from Last 3 Encounters:  09/14/20 179 lb (81.2 kg)  07/06/20 184 lb (83.5 kg)  06/22/20 184 lb (83.5 kg)    Physical Exam Vitals reviewed.  HENT:     Nose: Nose normal.     Mouth/Throat:     Mouth: Mucous membranes are moist.  Eyes:     General: No scleral icterus.    Conjunctiva/sclera: Conjunctivae normal.  Cardiovascular:  Rate and Rhythm: Normal rate and regular rhythm.     Heart sounds: No murmur heard. Pulmonary:     Effort: Pulmonary effort is normal.     Breath sounds: No stridor. No wheezing, rhonchi or rales.  Abdominal:     General: Abdomen is flat.     Palpations: There is no mass.     Tenderness: There is no abdominal tenderness. There is no guarding.  Musculoskeletal:        General: Normal range of motion.     Cervical back: Neck supple.     Right lower leg: No edema.     Left lower leg: No edema.  Lymphadenopathy:     Cervical: No cervical adenopathy.  Skin:    General: Skin is warm and dry.  Neurological:      General: No focal deficit present.     Mental Status: He is alert.  Psychiatric:        Mood and Affect: Mood normal.        Behavior: Behavior normal.    Lab Results  Component Value Date   WBC 5.5 09/14/2020   HGB 14.2 09/14/2020   HCT 41.7 09/14/2020   PLT 304.0 09/14/2020   GLUCOSE 93 09/14/2020   CHOL 162 10/22/2019   TRIG 95 10/22/2019   HDL 74 10/22/2019   LDLDIRECT 147.7 12/03/2007   LDLCALC 70 10/22/2019   ALT 20 10/22/2019   AST 25 10/22/2019   NA 142 09/14/2020   K 3.7 09/14/2020   CL 107 09/14/2020   CREATININE 1.06 09/14/2020   BUN 27 (H) 09/14/2020   CO2 22 09/14/2020   TSH 1.42 09/14/2020   PSA 1.2 10/22/2019   INR 1.1 (H) 04/24/2018   HGBA1C 5.5 02/20/2017    MR CERVICAL SPINE WO CONTRAST  Result Date: 07/06/2020 CLINICAL DATA:  Posterior neck pain EXAM: MRI CERVICAL SPINE WITHOUT CONTRAST TECHNIQUE: Multiplanar, multisequence MR imaging of the cervical spine was performed. No intravenous contrast was administered. COMPARISON:  None. FINDINGS: Alignment: There is slight reversal of the normal cervical lordosis. A minimal anterolisthesis of C4 on C5 is seen measuring 3 mm. The Vertebrae: The vertebral body heights are well maintained. No fracture, marrow edema,or pathologic marrow infiltration. Disc height loss with endplate reactive changes are seen from C5 through C7 Cord: Normal signal and morphology. Posterior Fossa, vertebral arteries, paraspinal tissues: The visualized portion of the posterior fossa is unremarkable. Normal flow voids seen within the vertebral arteries. The paraspinal soft tissues are unremarkable. Disc levels: C1-C2: Atlanto-axial junction is normal, without canal narrowing C2-C3: There is a disc osteophyte complex and uncovertebral osteophytes which causes severe left and mild right neural foraminal narrowing. C3-C4: There is a disc osteophyte complex and uncovertebral osteophytes which causes severe bilateral neural foraminal narrowing and  mild effacement anterior thecal sac. C4-C5: There is a disc osteophyte complex and a left lateral recess disc protrusion which contacts and impinges the left C5 nerve root. There is severe left and mild right neural foraminal narrowing and mild central canal stenosis. C5-C6: There is a disc osteophyte complex with a right lateral recess disc protrusion which contacts the right C6 nerve root without impingement. There is moderate bilateral neural foraminal narrowing central canal stenosis. C6-C7: There is a disc osteophyte complex and uncovertebral osteophytes which causes severe left and moderate right neural foraminal narrowing and mild canal. C7-T1: No significant spinal canal or neural foraminal narrowing IMPRESSION: Slight reversal of the normal cervical lordosis with a minimal anterolisthesis C4 on  C5. Cervical spine spondylosis most notable at C4-C5 with a left lateral recess disc protrusion contacting and impinging the left C5 nerve root. There is severe left neural foraminal narrowing and mild central canal stenosis. Electronically Signed   By: Prudencio Pair M.D.   On: 07/06/2020 11:33    Assessment & Plan:   Victorio was seen today for hypertension and hypothyroidism.  Diagnoses and all orders for this visit:  Essential hypertension, benign- Based on his home readings his blood pressure is well controlled.  I think there is some component of whitecoat hypertension. -     CBC with Differential/Platelet; Future -     Basic metabolic panel; Future -     Magnesium; Future -     TSH; Future -     TSH -     Magnesium -     Basic metabolic panel -     CBC with Differential/Platelet  Acquired hypothyroidism- His TSH is in the normal range.  Will continue the current dose of levothyroxine. -     TSH; Future -     TSH  Thiamine deficiency- Will continue the thiamine supplement. -     CBC with Differential/Platelet; Future -     CBC with Differential/Platelet  Hypomagnesemia -     Magnesium;  Future -     Magnesium -     Magnesium Oxide 400 MG CAPS; Take 1 capsule (400 mg total) by mouth in the morning and at bedtime.  I have discontinued Gildardo Griffes. Citro "Skip"'s Magnesium Oxide. I am also having him start on Magnesium Oxide. Additionally, I am having him maintain his aspirin, carvedilol, thiamine, candesartan, gabapentin, zolpidem, levothyroxine, rosuvastatin, and omeprazole.  Meds ordered this encounter  Medications   Magnesium Oxide 400 MG CAPS    Sig: Take 1 capsule (400 mg total) by mouth in the morning and at bedtime.    Dispense:  180 capsule    Refill:  0     Follow-up: No follow-ups on file.  Scarlette Calico, MD

## 2020-10-06 ENCOUNTER — Ambulatory Visit (INDEPENDENT_AMBULATORY_CARE_PROVIDER_SITE_OTHER): Payer: Medicare HMO | Admitting: Internal Medicine

## 2020-10-06 ENCOUNTER — Other Ambulatory Visit: Payer: Self-pay

## 2020-10-06 ENCOUNTER — Encounter: Payer: Self-pay | Admitting: Internal Medicine

## 2020-10-06 VITALS — BP 126/86 | HR 86 | Temp 98.0°F | Ht 70.0 in | Wt 179.0 lb

## 2020-10-06 DIAGNOSIS — I10 Essential (primary) hypertension: Secondary | ICD-10-CM | POA: Diagnosis not present

## 2020-10-06 DIAGNOSIS — K439 Ventral hernia without obstruction or gangrene: Secondary | ICD-10-CM | POA: Insufficient documentation

## 2020-10-06 NOTE — Patient Instructions (Signed)

## 2020-10-06 NOTE — Progress Notes (Signed)
Subjective:  Patient ID: Brandon [name], male    DOB: 10/31/1950  Age: 70 y.o. MRN: [id number]  CC: Hypertension  This visit occurred during the SARS-CoV-2 public health emergency.  Safety protocols were in place, including screening questions prior to the visit, additional usage of staff PPE, and extensive cleaning of exam room while observing appropriate contact time as indicated for disinfecting solutions.    HPI Brandon [name] presents for f/up -   His daughter recently took a picture of them floating in a swimming pool.  They noticed that there was a bulge in his abdomen on the photo.  It does not bother him.  Outpatient Medications Prior to Visit  Medication Sig Dispense Refill   aspirin 81 MG tablet Take 81 mg by mouth daily.     candesartan (ATACAND) 32 MG tablet TAKE 1 TABLET (32 MG TOTAL) BY MOUTH DAILY. 90 tablet 1   carvedilol (COREG) 6.25 MG tablet TAKE 1 TABLET BY MOUTH 2 TIMES DAILY WITH A MEAL 180 tablet 1   gabapentin (NEURONTIN) 300 MG capsule Take 1 capsule (300 mg total) by mouth at bedtime. 90 capsule 1   levothyroxine (SYNTHROID) 100 MCG tablet TAKE 1 TABLET BY MOUTH EVERY DAY 90 tablet 1   Magnesium Oxide 400 MG CAPS Take 1 capsule (400 mg total) by mouth in the morning and at bedtime. 180 capsule 0   omeprazole (PRILOSEC) 40 MG capsule TAKE 1 CAPSULE BY MOUTH EVERY DAY 90 capsule 1   rosuvastatin (CRESTOR) 20 MG tablet Take 1 tablet (20 mg total) by mouth daily. 90 tablet 1   thiamine 100 MG tablet TAKE 1/2 TABLET DAILY 45 tablet 1   zolpidem (AMBIEN) 10 MG tablet TAKE 1 TABLET BY MOUTH EVERY DAY AT BEDTIME AS NEEDED 90 tablet 1   No facility-administered medications prior to visit.    ROS Review of Systems  Constitutional:  Negative for diaphoresis and fatigue.  HENT: Negative.    Eyes: Negative.   Respiratory:  Negative for cough, chest tightness, shortness of breath and wheezing.   Cardiovascular:  Negative for chest pain, palpitations and leg swelling.   Gastrointestinal:  Negative for abdominal distention, abdominal pain and constipation.  Endocrine: Negative.   Genitourinary:  Negative for difficulty urinating.  Musculoskeletal: Negative.   Skin: Negative.   Neurological: Negative.  Negative for dizziness, weakness and light-headedness.  Hematological:  Negative for adenopathy. Does not bruise/bleed easily.   Objective:  BP 126/86   Pulse 86   Temp 98 F (36.7 C) (Oral)   Ht 5\' 10"  (1.778 m)   Wt 179 lb (81.2 kg)   SpO2 96%   BMI 25.68 kg/m   BP Readings from Last 3 Encounters:  10/06/20 126/86  09/14/20 (!) 142/88  07/06/20 132/78    Wt Readings from Last 3 Encounters:  10/06/20 179 lb (81.2 kg)  09/14/20 179 lb (81.2 kg)  07/06/20 184 lb (83.5 kg)    Physical Exam Vitals reviewed.  Cardiovascular:     Rate and Rhythm: Normal rate and regular rhythm.     Heart sounds: No murmur heard. Pulmonary:     Effort: Pulmonary effort is normal.     Breath sounds: No stridor. No wheezing, rhonchi or rales.  Abdominal:     General: Abdomen is protuberant. There is no distension.     Palpations: There is no hepatomegaly, splenomegaly, mass or pulsatile mass.     Tenderness: There is no abdominal tenderness.     Hernia: A  hernia is present. Hernia is present in the ventral area. There is no hernia in the umbilical area, left inguinal area, right femoral area, left femoral area or right inguinal area.    Genitourinary:    Pubic Area: No rash.      Penis: Normal and uncircumcised.      Testes: Normal.        Right: Mass not present.        Left: Mass not present.     Epididymis:     Right: Normal.     Left: Normal.  Musculoskeletal:        General: Normal range of motion.     Right lower leg: No edema.     Left lower leg: No edema.  Lymphadenopathy:     Lower Body: No right inguinal adenopathy. No left inguinal adenopathy.  Skin:    General: Skin is warm and dry.  Neurological:     General: No focal deficit  present.    Lab Results  Component Value Date   WBC 5.5 09/14/2020   HGB 14.2 09/14/2020   HCT 41.7 09/14/2020   PLT 304.0 09/14/2020   GLUCOSE 93 09/14/2020   CHOL 162 10/22/2019   TRIG 95 10/22/2019   HDL 74 10/22/2019   LDLDIRECT 147.7 12/03/2007   LDLCALC 70 10/22/2019   ALT 20 10/22/2019   AST 25 10/22/2019   NA 142 09/14/2020   K 3.7 09/14/2020   CL 107 09/14/2020   CREATININE 1.06 09/14/2020   BUN 27 (H) 09/14/2020   CO2 22 09/14/2020   TSH 1.42 09/14/2020   PSA 1.2 10/22/2019   INR 1.1 (H) 04/24/2018   HGBA1C 5.5 02/20/2017    MR CERVICAL SPINE WO CONTRAST  Result Date: 07/06/2020 CLINICAL DATA:  Posterior neck pain EXAM: MRI CERVICAL SPINE WITHOUT CONTRAST TECHNIQUE: Multiplanar, multisequence MR imaging of the cervical spine was performed. No intravenous contrast was administered. COMPARISON:  None. FINDINGS: Alignment: There is slight reversal of the normal cervical lordosis. A minimal anterolisthesis of C4 on C5 is seen measuring 3 mm. The Vertebrae: The vertebral body heights are well maintained. No fracture, marrow edema,or pathologic marrow infiltration. Disc height loss with endplate reactive changes are seen from C5 through C7 Cord: Normal signal and morphology. Posterior Fossa, vertebral arteries, paraspinal tissues: The visualized portion of the posterior fossa is unremarkable. Normal flow voids seen within the vertebral arteries. The paraspinal soft tissues are unremarkable. Disc levels: C1-C2: Atlanto-axial junction is normal, without canal narrowing C2-C3: There is a disc osteophyte complex and uncovertebral osteophytes which causes severe left and mild right neural foraminal narrowing. C3-C4: There is a disc osteophyte complex and uncovertebral osteophytes which causes severe bilateral neural foraminal narrowing and mild effacement anterior thecal sac. C4-C5: There is a disc osteophyte complex and a left lateral recess disc protrusion which contacts and impinges  the left C5 nerve root. There is severe left and mild right neural foraminal narrowing and mild central canal stenosis. C5-C6: There is a disc osteophyte complex with a right lateral recess disc protrusion which contacts the right C6 nerve root without impingement. There is moderate bilateral neural foraminal narrowing central canal stenosis. C6-C7: There is a disc osteophyte complex and uncovertebral osteophytes which causes severe left and moderate right neural foraminal narrowing and mild canal. C7-T1: No significant spinal canal or neural foraminal narrowing IMPRESSION: Slight reversal of the normal cervical lordosis with a minimal anterolisthesis C4 on C5. Cervical spine spondylosis most notable at C4-C5 with a left  lateral recess disc protrusion contacting and impinging the left C5 nerve root. There is severe left neural foraminal narrowing and mild central canal stenosis. Electronically Signed   By: Prudencio Pair M.D.   On: 07/06/2020 11:33    Assessment & Plan:   Brandon [name] was seen today for hypertension.  Diagnoses and all orders for this visit:  Ventral hernia without obstruction or gangrene- He has a small, uncomplicated, asymptomatic ventral abdominal hernia.  I discussed with him the options of seeing a Psychologist, sport and exercise.  He is not bothered by this at this time and prefers not to see a Psychologist, sport and exercise.  I gave him information about this.  He will let me know if he changes his mind or if it becomes symptomatic.  Essential hypertension, benign- His blood pressure is adequately well controlled.  I am having Brandon [name]. Brandon "Skip" maintain his aspirin, carvedilol, thiamine, candesartan, gabapentin, zolpidem, levothyroxine, rosuvastatin, omeprazole, and Magnesium Oxide.  No orders of the defined types were placed in this encounter.    Follow-up: Return in about 3 months (around 01/06/2021).  Scarlette Calico, MD

## 2020-10-18 ENCOUNTER — Other Ambulatory Visit: Payer: Self-pay | Admitting: Internal Medicine

## 2020-10-18 DIAGNOSIS — I10 Essential (primary) hypertension: Secondary | ICD-10-CM

## 2020-10-19 ENCOUNTER — Encounter: Payer: Self-pay | Admitting: Internal Medicine

## 2020-10-19 ENCOUNTER — Other Ambulatory Visit: Payer: Self-pay | Admitting: Internal Medicine

## 2020-10-19 DIAGNOSIS — I712 Thoracic aortic aneurysm, without rupture, unspecified: Secondary | ICD-10-CM

## 2020-10-19 DIAGNOSIS — I10 Essential (primary) hypertension: Secondary | ICD-10-CM

## 2020-10-19 DIAGNOSIS — T451X5A Adverse effect of antineoplastic and immunosuppressive drugs, initial encounter: Secondary | ICD-10-CM

## 2020-10-19 DIAGNOSIS — G620 Drug-induced polyneuropathy: Secondary | ICD-10-CM

## 2020-10-19 DIAGNOSIS — F5101 Primary insomnia: Secondary | ICD-10-CM

## 2020-10-19 MED ORDER — ZOLPIDEM TARTRATE 10 MG PO TABS
ORAL_TABLET | ORAL | 1 refills | Status: DC
Start: 2020-10-19 — End: 2021-05-01

## 2020-10-19 MED ORDER — CARVEDILOL 6.25 MG PO TABS: ORAL_TABLET | ORAL | 0 refills | Status: DC

## 2020-10-19 MED ORDER — GABAPENTIN 300 MG PO CAPS: 300.0000 mg | ORAL_CAPSULE | Freq: Every day | ORAL | 1 refills | Status: DC

## 2020-11-01 DIAGNOSIS — M47812 Spondylosis without myelopathy or radiculopathy, cervical region: Secondary | ICD-10-CM | POA: Diagnosis not present

## 2020-11-04 ENCOUNTER — Ambulatory Visit: Payer: Medicare HMO | Attending: Critical Care Medicine

## 2020-11-04 DIAGNOSIS — Z20822 Contact with and (suspected) exposure to covid-19: Secondary | ICD-10-CM

## 2020-11-05 LAB — NOVEL CORONAVIRUS, NAA: SARS-CoV-2, NAA: NOT DETECTED

## 2020-11-05 LAB — SARS-COV-2, NAA 2 DAY TAT

## 2020-11-15 DIAGNOSIS — Z6825 Body mass index (BMI) 25.0-25.9, adult: Secondary | ICD-10-CM | POA: Diagnosis not present

## 2020-11-15 DIAGNOSIS — R030 Elevated blood-pressure reading, without diagnosis of hypertension: Secondary | ICD-10-CM | POA: Diagnosis not present

## 2020-11-15 DIAGNOSIS — M4722 Other spondylosis with radiculopathy, cervical region: Secondary | ICD-10-CM | POA: Diagnosis not present

## 2020-11-15 DIAGNOSIS — M47812 Spondylosis without myelopathy or radiculopathy, cervical region: Secondary | ICD-10-CM | POA: Diagnosis not present

## 2020-12-05 ENCOUNTER — Other Ambulatory Visit (HOSPITAL_BASED_OUTPATIENT_CLINIC_OR_DEPARTMENT_OTHER): Admitting: Neurology

## 2020-12-06 ENCOUNTER — Encounter

## 2020-12-09 ENCOUNTER — Other Ambulatory Visit: Payer: Self-pay | Admitting: Internal Medicine

## 2020-12-09 DIAGNOSIS — E039 Hypothyroidism, unspecified: Secondary | ICD-10-CM

## 2020-12-31 NOTE — Progress Notes (Signed)
[place] Phone: [phone] Subjective:   [name], am serving as a scribe for Dr. Hulan Saas.  This visit occurred during the SARS-CoV-2 public health emergency.  Safety protocols were in place, including screening questions prior to the visit, additional usage of staff PPE, and extensive cleaning of exam room while observing appropriate contact time as indicated for disinfecting solutions.   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    Patient has been doing gabapentin 300 mg and seem to be doing relatively well.  We did discuss the possibility of a muscle relaxer.  Patient has not as well as anti-inflammatories.  We can refill if necessary.  We also discussed that with patient having some mild difficulty with his sometimes picking up a glass disease secondary to more weakness.  Due to patient's also history of the squamous cell carcinoma we will get MRI of the neck to further evaluate due to the severity of the arthritis.  Hopefully patient will continue to do well though follow-up with me again 2 to 3 months but if patient does have an MRI showing nerve impingement patient could be a candidate for epidurals.  Spent greater than 30 minutes discussing with patient, looking at previous imaging, and discussing different medications.  Update 01/03/2021 FUAD FORGET is a 70 y.o. male coming in with complaint of neck pain. Had injections in neck a few weeks ago. States that injection did not help this time.   States that his L arm is weak. Pain in bicep and unable to hold items without pain for past month.  Does not remember any true injury but does work with his arms a lot Also having lower back and L hip  pain for past month. Had hip replacement at age 69. Pain into groin and L thigh.  Patient states some increasing instability recently.  Sometimes feels like his leg is weak  MRI cervical March 2022 IMPRESSION: Slight reversal of the normal cervical lordosis with a minimal anterolisthesis C4 on C5.   Cervical spine spondylosis most notable at C4-C5 with a left lateral recess disc protrusion contacting and impinging the left C5 nerve root. There is severe left neural foraminal narrowing and mild central canal stenosis     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   chemo/xrt comp 06/2009   Thyroid disease    Past Surgical History:  Procedure Laterality Date   HERNIA REPAIR     inguinal   hip replacemnt left Left    Left eye     stmach surgery     repair peg tube site   Social History   Socioeconomic History   Marital status: Married    Spouse name: Not on file   Number of children: 2   Years of education: 15   Highest education level: Not on file  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 History   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 married - '76 - 5 years, divorced; remarried  '84. 1 son - '85; 1 daughter  '89. 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    Social Determinants of Health   Financial Resource Strain: Not on file  Food Insecurity: Not on file  Transportation Needs: Not on file  Physical Activity: Not on file  Stress: Not on file  Social Connections: Not on file   Allergies  Allergen Reactions   Benicar [Olmesartan]     fatigue   Amlodipine Swelling   Family History  Problem Relation Age of Onset   Cancer Mother        lymphoma   Colon cancer Mother    Cancer Father        lung (smoker)   Hyperlipidemia Father    Hypertension Father    Diabetes Father    Esophageal cancer Neg Hx    Rectal cancer Neg Hx    Stomach cancer Neg Hx     Current Outpatient Medications (Endocrine & Metabolic):    levothyroxine (SYNTHROID) 100 MCG tablet, TAKE 1 TABLET BY MOUTH EVERY DAY  Current Outpatient Medications (Cardiovascular):    candesartan (ATACAND) 32 MG tablet, TAKE 1 TABLET BY MOUTH DAILY.   carvedilol (COREG) 6.25 MG tablet, TAKE 1 TABLET BY MOUTH 2 TIMES DAILY WITH A MEAL   rosuvastatin (CRESTOR) 20 MG tablet, Take 1 tablet (20 mg total) by mouth daily.   Current Outpatient Medications (Analgesics):    aspirin 81 MG tablet, Take 81 mg by mouth daily.   Current Outpatient Medications (Other):    gabapentin (NEURONTIN) 300 MG capsule, Take 1 capsule (300 mg total) by mouth at bedtime.   Magnesium Oxide 400 MG CAPS, Take 1 capsule (400 mg total) by mouth in the morning and at bedtime.   omeprazole (PRILOSEC) 40 MG capsule, TAKE 1 CAPSULE BY MOUTH EVERY DAY   thiamine 100 MG tablet, TAKE 1/2 TABLET DAILY   zolpidem (AMBIEN) 10 MG tablet, TAKE 1 TABLET BY MOUTH EVERY DAY AT BEDTIME AS NEEDED   Reviewed prior external information including notes and imaging from  primary care provider As well as notes that were available from care everywhere and other healthcare systems.  Past medical history, social, surgical and family history all reviewed in  electronic medical record.  No pertanent information unless stated regarding to the chief complaint.   Review of Systems:  No headache, visual changes, nausea, vomiting, diarrhea, constipation, dizziness, abdominal pain, skin rash, fevers, chills, night sweats, weight loss, swollen lymph nodes, body aches, joint swelling, chest pain, shortness of breath, mood changes. POSITIVE muscle aches  Objective  Blood pressure (!) 138/94, pulse (!) 58, height 5\' 10"  (1.778 m), weight 176 lb (79.8 kg), SpO2 97 %.   General:  No apparent distress alert and oriented x3 mood and affect normal, dressed appropriately.  HEENT: Pupils equal, extraocular movements intact  Respiratory: Patient's speak in full sentences and does not appear short of breath  Cardiovascular: No lower extremity edema, non tender, no erythema  Gait normal with good balance and coordination.  MSK:   Neck exam does have loss of lordosis.  Patient does have a 25 degrees of extension.  Patient does have mild radicular symptoms in the C6 distribution on the left side.  Patient does have though deep tendon reflexes intact.  Very mild weakness in the C6 distribution on the left compared to the right. Left shoulder exam shows patient does have positive speeds noted.  Mild positive impingement noted as well.  Rotator cuff strength is 4 out of 5 but symmetric to the contralateral side. Left hip exam patient does have a degree of internal and external range of motion but tightness noted with external range of motion.  Patient does have pain in the groin area with movement.  No grinding noted.  Neurovascular intact distally.  Patient does have some mild atrophy of the thighs bilaterally but seems to be symmetric  Limited muscular skeletal ultrasound was performed and interpreted by Hulan Saas, M  Limited ultrasound of patient's shoulder shows the patient does have significant hypoechoic changes noted in the bicep tendon sheath.  No true tearing  appreciated.  Patient has a lot of mild abnormality noted of the anterior labrum.  Patient's rotator cuff does have some degenerative changes but no acute tearing noted. Impression: Bicep tendinitis with other left shoulder pathology    Impression and Recommendations:     The above documentation has been reviewed and is accurate and complete Lyndal Pulley, DO

## 2021-01-03 ENCOUNTER — Other Ambulatory Visit: Payer: Self-pay | Admitting: Family Medicine

## 2021-01-03 ENCOUNTER — Other Ambulatory Visit: Payer: Self-pay

## 2021-01-03 ENCOUNTER — Ambulatory Visit (INDEPENDENT_AMBULATORY_CARE_PROVIDER_SITE_OTHER): Payer: Medicare HMO

## 2021-01-03 ENCOUNTER — Encounter: Payer: Self-pay | Admitting: Family Medicine

## 2021-01-03 ENCOUNTER — Ambulatory Visit: Payer: Medicare HMO | Admitting: Family Medicine

## 2021-01-03 VITALS — BP 138/94 | HR 58 | Ht 70.0 in | Wt 176.0 lb

## 2021-01-03 DIAGNOSIS — M25511 Pain in right shoulder: Secondary | ICD-10-CM

## 2021-01-03 DIAGNOSIS — Z96642 Presence of left artificial hip joint: Secondary | ICD-10-CM

## 2021-01-03 DIAGNOSIS — M5030 Other cervical disc degeneration, unspecified cervical region: Secondary | ICD-10-CM | POA: Diagnosis not present

## 2021-01-03 DIAGNOSIS — M25512 Pain in left shoulder: Secondary | ICD-10-CM

## 2021-01-03 DIAGNOSIS — M25552 Pain in left hip: Secondary | ICD-10-CM | POA: Diagnosis not present

## 2021-01-03 DIAGNOSIS — G8929 Other chronic pain: Secondary | ICD-10-CM | POA: Insufficient documentation

## 2021-01-03 DIAGNOSIS — M5412 Radiculopathy, cervical region: Secondary | ICD-10-CM

## 2021-01-03 DIAGNOSIS — M19012 Primary osteoarthritis, left shoulder: Secondary | ICD-10-CM | POA: Diagnosis not present

## 2021-01-03 NOTE — Assessment & Plan Note (Signed)
Known degenerative disc disease.  Not responding as well to the left appears to be facet injection by another provider at this time.  We discussed the possibility of an epidural at the C7-T1 area which patient is in agreement with.  We will order this to see how patient responds.  Discussed icing regimen and home exercises.  We discussed posture ergonomics.  Follow-up with me again in 6 to 8 weeks for further evaluation and treatment.

## 2021-01-03 NOTE — Assessment & Plan Note (Signed)
Patient did have a replacement 19 years ago.  Possible instability noted.  We will get x-rays to further evaluate and may need the potential for a bone scan.  Discussed potential compression sleeve to the thigh to help him with some of the discomfort throughout the day.  We will hold on any pain medication at the moment and patient is on gabapentin.  Can consider it if necessary.  Follow-up with me again in 4 to 6 weeks

## 2021-01-03 NOTE — Assessment & Plan Note (Signed)
Patient does have some chronic left shoulder pain.  Seems to be worsening recently.  On ultrasound does have bicep tendinitis with hypoechoic changes noted.  Discussed compression and home exercises.  Concerned that some of it could be secondary to cervical radiculopathy and we will see how the epidural responds.  Home exercises given.  Discussed continuing the gabapentin.  Follow-up we will ultrasound abdomen and depending on findings we will consider the possibility of injections, possible need for advanced imaging.  Patient does use arms a significant amount follow-up appointment with her stated in 4 to 6 weeks

## 2021-01-03 NOTE — Patient Instructions (Addendum)
Xray today Epidural [id number] Bicep tendonitis-exercises Compression arm and hip Ice at end of day See me again in 4 weeks

## 2021-01-04 ENCOUNTER — Other Ambulatory Visit: Payer: Self-pay

## 2021-01-04 DIAGNOSIS — M25552 Pain in left hip: Secondary | ICD-10-CM

## 2021-01-07 ENCOUNTER — Ambulatory Visit
Admission: RE | Admit: 2021-01-07 | Discharge: 2021-01-07 | Disposition: A | Discharge status: Home / Self Care | Payer: Medicare HMO | Source: Ambulatory Visit | Attending: Family Medicine | Admitting: Family Medicine

## 2021-01-07 ENCOUNTER — Other Ambulatory Visit: Payer: Self-pay

## 2021-01-07 DIAGNOSIS — M5412 Radiculopathy, cervical region: Secondary | ICD-10-CM

## 2021-01-07 DIAGNOSIS — M50121 Cervical disc disorder at C4-C5 level with radiculopathy: Secondary | ICD-10-CM | POA: Diagnosis not present

## 2021-01-07 MED ORDER — IOPAMIDOL (ISOVUE-M 300) INJECTION 61%
1.0000 mL | Freq: Once | INTRAMUSCULAR | Status: AC
  Administered 2021-01-07: 1 mL via EPIDURAL

## 2021-01-07 MED ORDER — TRIAMCINOLONE ACETONIDE 40 MG/ML IJ SUSP (RADIOLOGY)
60.0000 mg | Freq: Once | INTRAMUSCULAR | Status: AC
  Administered 2021-01-07: 60 mg via EPIDURAL

## 2021-01-07 NOTE — Discharge Instructions (Signed)

## 2021-01-14 ENCOUNTER — Other Ambulatory Visit: Payer: Self-pay | Admitting: Internal Medicine

## 2021-01-14 DIAGNOSIS — I10 Essential (primary) hypertension: Secondary | ICD-10-CM

## 2021-01-14 DIAGNOSIS — I712 Thoracic aortic aneurysm, without rupture, unspecified: Secondary | ICD-10-CM

## 2021-01-20 ENCOUNTER — Encounter: Payer: Self-pay | Admitting: Internal Medicine

## 2021-01-24 ENCOUNTER — Other Ambulatory Visit: Payer: Self-pay

## 2021-01-24 ENCOUNTER — Ambulatory Visit (HOSPITAL_COMMUNITY)
Admission: RE | Admit: 2021-01-24 | Discharge: 2021-01-24 | Disposition: A | Discharge status: Home / Self Care | Payer: Medicare HMO | Source: Ambulatory Visit | Attending: Family Medicine | Admitting: Family Medicine

## 2021-01-24 ENCOUNTER — Encounter (HOSPITAL_COMMUNITY)
Admission: RE | Admit: 2021-01-24 | Discharge: 2021-01-24 | Disposition: A | Discharge status: Home / Self Care | Payer: Medicare HMO | Source: Ambulatory Visit | Attending: Family Medicine | Admitting: Family Medicine

## 2021-01-24 DIAGNOSIS — M25552 Pain in left hip: Secondary | ICD-10-CM | POA: Diagnosis not present

## 2021-01-24 DIAGNOSIS — Z96642 Presence of left artificial hip joint: Secondary | ICD-10-CM | POA: Diagnosis not present

## 2021-01-24 MED ORDER — TECHNETIUM TC 99M MEDRONATE IV KIT
20.0000 | PACK | Freq: Once | INTRAVENOUS | Status: AC | PRN
  Administered 2021-01-24: 20 via INTRAVENOUS

## 2021-01-31 ENCOUNTER — Ambulatory Visit: Payer: Medicare HMO | Admitting: Family Medicine

## 2021-01-31 ENCOUNTER — Other Ambulatory Visit: Payer: Self-pay

## 2021-01-31 ENCOUNTER — Ambulatory Visit: Payer: Self-pay

## 2021-01-31 ENCOUNTER — Encounter: Payer: Self-pay | Admitting: Family Medicine

## 2021-01-31 VITALS — BP 112/82 | HR 76 | Ht 70.0 in | Wt 177.0 lb

## 2021-01-31 DIAGNOSIS — M25552 Pain in left hip: Secondary | ICD-10-CM

## 2021-01-31 DIAGNOSIS — G8929 Other chronic pain: Secondary | ICD-10-CM | POA: Diagnosis not present

## 2021-01-31 DIAGNOSIS — M545 Low back pain, unspecified: Secondary | ICD-10-CM

## 2021-01-31 DIAGNOSIS — Z96642 Presence of left artificial hip joint: Secondary | ICD-10-CM | POA: Diagnosis not present

## 2021-01-31 DIAGNOSIS — M5030 Other cervical disc degeneration, unspecified cervical region: Secondary | ICD-10-CM | POA: Diagnosis not present

## 2021-01-31 MED ORDER — PREDNISONE 20 MG PO TABS: 40.0000 mg | ORAL_TABLET | Freq: Every day | ORAL | 0 refills | Status: DC

## 2021-01-31 NOTE — Assessment & Plan Note (Signed)
Significant improvement since the epidural at this time.  Discussed with patient about posture and ergonomics.  Discussed which activities to do which wants to avoid.  Follow-up with me again in 6 to 8 weeks

## 2021-01-31 NOTE — Assessment & Plan Note (Signed)
Patient given injection and tolerated the procedure well, discussed icing regimen.  Discussed taking potential prednisone.  With patient's now having the bone scan normal concerned that there is a possibility for lumbar radiculopathy as well.  Will order an MRI of the lumbar spine for further evaluation.  This is affecting patient's sleep as well as daily activity and patient's job performance.  Depending on findings patient could be a candidate also for the possibility of epidurals which patient has responded well to in the cervical region previously.  Follow-up with me again in 6 weeks otherwise

## 2021-01-31 NOTE — Patient Instructions (Signed)
Prednisone 40mg  for 5 days MRI lumbar [phone] See me in 6 weeks

## 2021-01-31 NOTE — Progress Notes (Signed)
[place] Phone: [phone] Subjective:   Brandon Brandon [name], am serving as a scribe for Dr. Hulan Saas. This visit occurred during the SARS-CoV-2 public health emergency.  Safety protocols were in place, including screening questions prior to the visit, additional usage of staff PPE, and extensive cleaning of exam room while observing appropriate contact time as indicated for disinfecting solutions.    I'm seeing this patient by the request  of:  Brandon Lima, MD  CC: Hip pain follow-up, neck pain  [id number]  Brandon Brandon [name] is a 70 y.o. male coming in with complaint of neck and hip pain  Patient is having degenerative disc disease with cervical radiculopathy.  Patient did have an epidural of the neck done that did not make significant improvement.  MRI did show the patient did have a left lateral recess disc protrusion causing a left LC 5 patient did have a repeat epidural of the cervical spine done on September 30.  Patient was also having pain in the left hip.  Patient did have a replacement of the left hip in 2011.  X-rays were taken at last exam of the left hip showing the patient did have degenerative disc disease that was fairly severe the lumbar spine and patient does have avascular necrosis of the right femoral head but stable. Patient did have a bone scan that was also independently visualized by me.  There was Brandon [name] significant loosening or infection noted.  Patient was also found to have significant bicep tendinitis as well as likely an anterior labral pathology and degenerative changes of the rotator cuff.  Shoulder and neck pain have subsided. Epidural 01/07/2021. Pain in L groin continues. Having hard time sleeping at night due to pain. Antalgic gait due to pain. Pain is sharp.   Bone scan 01/08/2021 IMPRESSION: Negative three-phase bone scan of the LEFT hip prosthesis.    Past Medical History:   Diagnosis Date   Allergy    mild   Aortic aneurysm (HCC)    small and watching   GERD (gastroesophageal reflux disease)    Head and neck cancer    head and neck ca dx 03/2009   Hyperlipidemia    Hypertension    oropharyngeal ca dx'd 03/2009   chemo/xrt comp 06/2009   Thyroid disease    Past Surgical History:  Procedure Laterality Date   HERNIA REPAIR     inguinal   hip replacemnt left Left    Left eye     stmach surgery     repair peg tube site   Social History   Socioeconomic History   Marital status: Married    Spouse name: Not on file   Number of children: 2   Years of education: 15   Highest education level: Not on file  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 History   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  Tobacco Use   Smoking status: Former    Packs/day: 1.50    Years: 35.00    Pack years: 52.50    Types: Cigarettes, Cigars    Start date: 05/16/1973    Quit date: 04/19/2009    Years since quitting: 11.7   Smokeless tobacco: Never   Tobacco comments:    quit smoking 5 years sago  Substance and Sexual Activity   Alcohol use: Yes    Alcohol/week: 30.0 standard drinks    Types: 30 Shots of liquor per week   Drug use: Brandon [name]  Sexual activity: Yes    Partners: Female  Other Topics Concern   Not on file  Social History Narrative   HSG, [employer]Brandon [name] degree. married - '76 - 5 years, divorced; remarried  '84. 1 son - '85; 1 daughter '89. 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    Social Determinants of Health   Financial Resource Strain: Not on file  Food Insecurity: Not on file  Transportation Needs: Not on file  Physical Activity: Not on file  Stress: Not on file  Social Connections: Not on file   Allergies  Allergen Reactions   Benicar [Olmesartan]     fatigue   Amlodipine Swelling   Family History  Problem Relation Age of Onset   Cancer Mother        lymphoma   Colon cancer Mother    Cancer Father        lung (smoker)   Hyperlipidemia  Father    Hypertension Father    Diabetes Father    Esophageal cancer Neg Hx    Rectal cancer Neg Hx    Stomach cancer Neg Hx     Current Outpatient Medications (Endocrine & Metabolic):    levothyroxine (SYNTHROID) 100 MCG tablet, TAKE 1 TABLET BY MOUTH EVERY DAY   predniSONE (DELTASONE) 20 MG tablet, Take 2 tablets (40 mg total) by mouth daily with breakfast.  Current Outpatient Medications (Cardiovascular):    candesartan (ATACAND) 32 MG tablet, TAKE 1 TABLET BY MOUTH DAILY.   carvedilol (COREG) 6.25 MG tablet, TAKE 1 TABLET BY MOUTH 2 TIMES DAILY WITH A MEAL   rosuvastatin (CRESTOR) 20 MG tablet, Take 1 tablet (20 mg total) by mouth daily.   Current Outpatient Medications (Analgesics):    aspirin 81 MG tablet, Take 81 mg by mouth daily.   Current Outpatient Medications (Other):    gabapentin (NEURONTIN) 300 MG capsule, Take 1 capsule (300 mg total) by mouth at bedtime.   Magnesium Oxide 400 MG CAPS, Take 1 capsule (400 mg total) by mouth in the morning and at bedtime.   omeprazole (PRILOSEC) 40 MG capsule, TAKE 1 CAPSULE BY MOUTH EVERY DAY   thiamine 100 MG tablet, TAKE 1/2 TABLET DAILY   zolpidem (AMBIEN) 10 MG tablet, TAKE 1 TABLET BY MOUTH EVERY DAY AT BEDTIME AS NEEDED   Reviewed prior external information including notes and imaging from  primary care provider As well as notes that were available from care everywhere and other healthcare systems.  Past medical history, social, surgical and family history all reviewed in electronic medical record.  Brandon [name] pertanent information unless stated regarding to the chief complaint.   Review of Systems:  Brandon [name] headache, visual changes, nausea, vomiting, diarrhea, constipation, dizziness, abdominal pain, skin rash, fevers, chills, night sweats, weight loss, swollen lymph nodes, joint swelling, chest pain, shortness of breath, mood changes.  POSITIVE muscle aches, body aches  Objective  Blood pressure 112/82, pulse 76, height 5\' 10"   (1.778 m), weight 177 lb (80.3 kg), SpO2 96 %.   General: Brandon [name] apparent distress alert and oriented x3 mood and affect normal, dressed appropriately.  HEENT: Pupils equal, extraocular movements intact  Respiratory: Patient's speak in full sentences and does not appear short of breath  Cardiovascular: Brandon [name] lower extremity edema, non tender, Brandon [name] erythema  Gait antalgic MSK: Left hip exam does have some limited range of motion in all planes.  Severely tender to palpation over the greater trochanteric area negative straight leg test.  Back exam does show some tenderness over the  paraspinal musculature of the lumbar spine with mild positive straight leg test noted.  Patient does have 4-5 strength on the left side compared to the right.  Neck exam still has significant lack of range of motion but Brandon [name] radicular symptoms with Spurling's today.  Patient does have improvement in strength of the upper extremity.  Procedure: Real-time Ultrasound Guided Injection of left  greater trochanteric bursitis  Device: GE Logiq Q7  Ultrasound guided injection is preferred based studies that show increased duration, increased effect, greater accuracy, decreased procedural pain, increased response rate, and decreased cost with ultrasound guided versus blind injection.  Verbal informed consent obtained.  Time-out conducted.  Noted Brandon [name] overlying erythema, induration, or other signs of local infection.  Skin prepped in a sterile fashion.  Local anesthesia: Topical Ethyl chloride.  With sterile technique and under real time ultrasound guidance:  Greater trochanteric area was visualized and patient's bursa was noted. A 22-gauge 3 inch needle was inserted and 4 cc of 0.5% Marcaine and 1 cc of Kenalog 40 mg/dL was injected.  Stayed superficial from the replacement. Completed without difficulty  Pain immediately resolved suggesting accurate placement of the medication.  Advised to call if fevers/chills, erythema, induration,  drainage, or persistent bleeding.   Impression: Technically successful ultrasound guided injection.    Impression and Recommendations:     The above documentation has been reviewed and is accurate and complete Lyndal Pulley, DO

## 2021-02-23 ENCOUNTER — Ambulatory Visit
Admission: RE | Admit: 2021-02-23 | Discharge: 2021-02-23 | Disposition: A | Discharge status: Home / Self Care | Payer: Medicare HMO | Source: Ambulatory Visit | Attending: Family Medicine | Admitting: Family Medicine

## 2021-02-23 ENCOUNTER — Other Ambulatory Visit: Payer: Self-pay

## 2021-02-23 ENCOUNTER — Encounter: Payer: Self-pay | Admitting: Family Medicine

## 2021-02-23 DIAGNOSIS — M545 Low back pain, unspecified: Secondary | ICD-10-CM

## 2021-02-23 DIAGNOSIS — M47816 Spondylosis without myelopathy or radiculopathy, lumbar region: Secondary | ICD-10-CM | POA: Diagnosis not present

## 2021-02-23 DIAGNOSIS — M4807 Spinal stenosis, lumbosacral region: Secondary | ICD-10-CM | POA: Diagnosis not present

## 2021-02-23 DIAGNOSIS — M2578 Osteophyte, vertebrae: Secondary | ICD-10-CM | POA: Diagnosis not present

## 2021-02-23 DIAGNOSIS — M48061 Spinal stenosis, lumbar region without neurogenic claudication: Secondary | ICD-10-CM | POA: Diagnosis not present

## 2021-02-24 ENCOUNTER — Other Ambulatory Visit: Payer: Self-pay

## 2021-02-24 DIAGNOSIS — M5416 Radiculopathy, lumbar region: Secondary | ICD-10-CM

## 2021-02-25 ENCOUNTER — Other Ambulatory Visit: Payer: Self-pay | Admitting: Internal Medicine

## 2021-02-25 DIAGNOSIS — K219 Gastro-esophageal reflux disease without esophagitis: Secondary | ICD-10-CM

## 2021-03-01 ENCOUNTER — Other Ambulatory Visit: Payer: Self-pay | Admitting: Family Medicine

## 2021-03-01 ENCOUNTER — Other Ambulatory Visit: Payer: Self-pay

## 2021-03-01 ENCOUNTER — Ambulatory Visit
Admission: RE | Admit: 2021-03-01 | Discharge: 2021-03-01 | Disposition: A | Discharge status: Home / Self Care | Payer: Medicare HMO | Source: Ambulatory Visit | Attending: Family Medicine | Admitting: Family Medicine

## 2021-03-01 DIAGNOSIS — M5416 Radiculopathy, lumbar region: Secondary | ICD-10-CM

## 2021-03-01 MED ORDER — IOPAMIDOL (ISOVUE-M 200) INJECTION 41%
1.0000 mL | Freq: Once | INTRAMUSCULAR | Status: AC
  Administered 2021-03-01: 1 mL via EPIDURAL

## 2021-03-01 MED ORDER — METHYLPREDNISOLONE ACETATE 40 MG/ML INJ SUSP (RADIOLOG
80.0000 mg | Freq: Once | INTRAMUSCULAR | Status: AC
  Administered 2021-03-01: 80 mg via EPIDURAL

## 2021-03-01 NOTE — Discharge Instructions (Signed)

## 2021-03-11 NOTE — Progress Notes (Signed)
Brandon [name] [place] [address] [place] Phone: [phone] Subjective:   IVilma [name], am serving as a scribe for Dr. Hulan Saas. This visit occurred during the SARS-CoV-2 public health emergency.  Safety protocols were in place, including screening questions prior to the visit, additional usage of staff PPE, and extensive cleaning of exam room while observing appropriate contact time as indicated for disinfecting solutions.   I'm seeing this patient by the request  of:  Brandon Lima, MD  CC: multiple joint pain   [name]  01/31/2021 Significant improvement since the epidural at this time.  Discussed with patient about posture and ergonomics.  Discussed which activities to do which wants to avoid.  Follow-up with me again in 6 to 8 weeks  Patient given injection and tolerated the procedure well, discussed icing regimen.  Discussed taking potential prednisone.  With patient's now having the bone scan normal concerned that there is a possibility for lumbar radiculopathy as well.  Will order an MRI of the lumbar spine for further evaluation.  This is affecting patient's sleep as well as daily activity and patient's job performance.  Depending on findings patient could be a candidate also for the possibility of epidurals which patient has responded well to in the cervical region previously.  Follow-up with me again in 6 weeks otherwise  Update 03/14/2021 Brandon [name] is a 70 y.o. male coming in with complaint of cervical, lumbar and L hip pain. History of L THR. Nerve Root injection 03/01/2021. Patient states pain has decreased in groin since injection. Still some pain in left hip and quad. Topical hemp has helped with the pain. Question about sexual life, less sensitive.  MRI Lumbar 02/23/2021 IMPRESSION: 1. Multilevel lumbar spondylosis, as above. 2. Moderate left-sided foraminal stenosis at L3-L4 and moderate bilateral foraminal  stenosis at L5-S1. 3. No canal stenosis at any level.  Bone scan left hip normal    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   chemo/xrt comp 06/2009   Thyroid disease    Past Surgical History:  Procedure Laterality Date   HERNIA REPAIR     inguinal   hip replacemnt left Left    Left eye     stmach surgery     repair peg tube site   Social History   Socioeconomic History   Marital status: Married    Spouse name: Not on file   Number of children: 2   Years of education: 15   Highest education level: Not on file  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 History   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 married - '76 - 5 years, divorced; remarried  '84. 1 son - '85; 1 daughter '89. 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    Social Determinants of Health   Financial Resource Strain: Not on file  Food Insecurity: Not on file  Transportation Needs: Not on file  Physical Activity: Not on file  Stress: Not on file  Social Connections: Not on file   Allergies  Allergen Reactions   Benicar [Olmesartan]     fatigue   Amlodipine Swelling   Family History  Problem Relation Age of Onset    Cancer Mother        lymphoma   Colon cancer Mother    Cancer Father        lung (smoker)   Hyperlipidemia Father    Hypertension Father    Diabetes Father    Esophageal cancer Neg Hx    Rectal cancer Neg Hx    Stomach cancer Neg Hx     Current Outpatient Medications (Endocrine & Metabolic):    levothyroxine (SYNTHROID) 100 MCG tablet, TAKE 1 TABLET BY MOUTH EVERY DAY   predniSONE (DELTASONE) 20 MG tablet, Take 2 tablets (40 mg total) by mouth daily with breakfast.  Current Outpatient Medications (Cardiovascular):    candesartan (ATACAND) 32 MG tablet, TAKE 1 TABLET BY MOUTH DAILY.   carvedilol (COREG) 6.25 MG tablet, TAKE 1 TABLET BY MOUTH 2 TIMES DAILY WITH A MEAL   rosuvastatin (CRESTOR) 20 MG tablet, Take 1 tablet (20 mg total) by mouth daily.   Current Outpatient Medications (Analgesics):    aspirin 81 MG tablet, Take 81 mg by mouth daily.   Current Outpatient Medications (Other):    gabapentin (NEURONTIN) 300 MG capsule, Take 1 capsule (300 mg total) by mouth at bedtime.   Magnesium Oxide 400 MG CAPS, Take 1 capsule (400 mg total) by mouth in the morning and at bedtime.   omeprazole (PRILOSEC) 40 MG capsule, TAKE 1 CAPSULE BY MOUTH EVERY DAY   thiamine 100 MG tablet, TAKE 1/2 TABLET DAILY   zolpidem (AMBIEN) 10 MG tablet, TAKE 1 TABLET BY MOUTH EVERY DAY AT BEDTIME AS NEEDED    Review of Systems:  No headache, visual changes, nausea, vomiting, diarrhea, constipation, dizziness, abdominal pain, skin rash, fevers, chills, night sweats, weight loss, swollen lymph nodes, body aches, joint swelling, chest pain, shortness of breath, mood changes. POSITIVE muscle aches  Objective  Blood pressure (!) 142/98, pulse 75, height 5\' 10"  (1.778 m), weight 175 lb (79.4 kg), SpO2 99 %.   General: No apparent distress alert and oriented x3 mood and affect normal, dressed appropriately.  HEENT: Pupils equal, extraocular movements intact  Respiratory: Patient's speak in full sentences  and does not appear short of breath  Cardiovascular: No lower extremity edema, non tender, no erythema  Gait normal with good balance and coordination.  MSK: Patient is getting out of a chair much faster than previously.  No significant positive straight leg test noted today which is improvement.  Still moderately tender in the paraspinal musculature of the lumbar spine on the left side and the greater trochanteric area.  Patient does have some atrophy noted of the gluteal area bilaterally. Neurovascularly intact distally.   Impression and Recommendations:     The above documentation has been reviewed and is accurate and complete Olevia Bowens  Tamala Julian, DO

## 2021-03-14 ENCOUNTER — Other Ambulatory Visit: Payer: Self-pay

## 2021-03-14 ENCOUNTER — Encounter: Payer: Self-pay | Admitting: Family Medicine

## 2021-03-14 ENCOUNTER — Ambulatory Visit: Payer: Medicare HMO | Admitting: Family Medicine

## 2021-03-14 DIAGNOSIS — M5136 Other intervertebral disc degeneration, lumbar region: Secondary | ICD-10-CM | POA: Diagnosis not present

## 2021-03-14 NOTE — Assessment & Plan Note (Signed)
Patient has responded extremely well to the nerve root injection at this time.  Discussed with patient about icing regimen and home exercises.  Bone scan of the hip looks relatively good.  Discussed that we continue to have difficulty I would like to consider laboratory work-up.  Patient also could have a repeat injection if necessary.  Follow-up with me again in 4 to 8 weeks

## 2021-03-14 NOTE — Patient Instructions (Signed)
Good to see you! Glad you're doing better I think everything will work itself out [employer] picture of ingredients of cream and oral you are taking See you again in when you need me but write me when you may need another injection

## 2021-03-17 ENCOUNTER — Encounter: Payer: Self-pay | Admitting: Family Medicine

## 2021-04-06 ENCOUNTER — Other Ambulatory Visit: Payer: Self-pay | Admitting: Internal Medicine

## 2021-04-06 DIAGNOSIS — E785 Hyperlipidemia, unspecified: Secondary | ICD-10-CM

## 2021-04-06 DIAGNOSIS — I712 Thoracic aortic aneurysm, without rupture, unspecified: Secondary | ICD-10-CM

## 2021-04-06 DIAGNOSIS — I10 Essential (primary) hypertension: Secondary | ICD-10-CM

## 2021-04-11 ENCOUNTER — Other Ambulatory Visit: Payer: Self-pay | Admitting: Internal Medicine

## 2021-04-11 DIAGNOSIS — I712 Thoracic aortic aneurysm, without rupture, unspecified: Secondary | ICD-10-CM

## 2021-04-11 DIAGNOSIS — I10 Essential (primary) hypertension: Secondary | ICD-10-CM

## 2021-04-13 ENCOUNTER — Encounter: Payer: Self-pay | Admitting: Internal Medicine

## 2021-04-13 ENCOUNTER — Ambulatory Visit (INDEPENDENT_AMBULATORY_CARE_PROVIDER_SITE_OTHER): Payer: Medicare HMO | Admitting: Internal Medicine

## 2021-04-13 ENCOUNTER — Other Ambulatory Visit: Payer: Self-pay

## 2021-04-13 VITALS — BP 138/82 | HR 76 | Temp 98.2°F | Ht 70.0 in | Wt 178.0 lb

## 2021-04-13 DIAGNOSIS — Z77011 Contact with and (suspected) exposure to lead: Secondary | ICD-10-CM

## 2021-04-13 DIAGNOSIS — E039 Hypothyroidism, unspecified: Secondary | ICD-10-CM

## 2021-04-13 DIAGNOSIS — I712 Thoracic aortic aneurysm, without rupture, unspecified: Secondary | ICD-10-CM

## 2021-04-13 DIAGNOSIS — E785 Hyperlipidemia, unspecified: Secondary | ICD-10-CM | POA: Diagnosis not present

## 2021-04-13 DIAGNOSIS — E519 Thiamine deficiency, unspecified: Secondary | ICD-10-CM | POA: Diagnosis not present

## 2021-04-13 DIAGNOSIS — I10 Essential (primary) hypertension: Secondary | ICD-10-CM | POA: Diagnosis not present

## 2021-04-13 DIAGNOSIS — N400 Enlarged prostate without lower urinary tract symptoms: Secondary | ICD-10-CM | POA: Diagnosis not present

## 2021-04-13 DIAGNOSIS — Z0001 Encounter for general adult medical examination with abnormal findings: Secondary | ICD-10-CM | POA: Insufficient documentation

## 2021-04-13 LAB — LIPID PANEL
Cholesterol: 148 mg/dL (ref 0–200)
HDL: 80.3 mg/dL (ref >39.00)
LDL Cholesterol: 52 mg/dL (ref 0–99)
NonHDL: 67.33
Total CHOL/HDL Ratio: 2
Triglycerides: 75 mg/dL (ref 0.0–149.0)
VLDL: 15 mg/dL (ref 0.0–40.0)

## 2021-04-13 LAB — CBC WITH DIFFERENTIAL/PLATELET
Basophils Absolute: 0 10*3/uL (ref 0.0–0.1)
Basophils Relative: 0.6 % (ref 0.0–3.0)
Eosinophils Absolute: 0.1 10*3/uL (ref 0.0–0.7)
Eosinophils Relative: 1.6 % (ref 0.0–5.0)
HCT: 40.7 % (ref 39.0–52.0)
Hemoglobin: 13.7 g/dL (ref 13.0–17.0)
Lymphocytes Relative: 22.9 % (ref 12.0–46.0)
Lymphs Abs: 1.4 10*3/uL (ref 0.7–4.0)
MCHC: 33.6 g/dL (ref 30.0–36.0)
MCV: 94.5 fl (ref 78.0–100.0)
Monocytes Absolute: 0.7 10*3/uL (ref 0.1–1.0)
Monocytes Relative: 10.9 % (ref 3.0–12.0)
Neutro Abs: 3.9 10*3/uL (ref 1.4–7.7)
Neutrophils Relative %: 64 % (ref 43.0–77.0)
Platelets: 329 10*3/uL (ref 150.0–400.0)
RBC: 4.3 Mil/uL (ref 4.22–5.81)
RDW: 15 % (ref 11.5–15.5)
WBC: 6.1 10*3/uL (ref 4.0–10.5)

## 2021-04-13 LAB — URINALYSIS, ROUTINE W REFLEX MICROSCOPIC
Bilirubin Urine: NEGATIVE
Hgb urine dipstick: NEGATIVE
Leukocytes,Ua: NEGATIVE
Nitrite: NEGATIVE
RBC / HPF: NONE SEEN (ref 0–?)
Specific Gravity, Urine: >=1.03 — AB (ref 1.000–1.030)
Total Protein, Urine: NEGATIVE
Urine Glucose: NEGATIVE
Urobilinogen, UA: 0.2 (ref 0.0–1.0)
pH: 5.5 (ref 5.0–8.0)

## 2021-04-13 LAB — HEPATIC FUNCTION PANEL
ALT: 20 U/L (ref 0–53)
AST: 25 U/L (ref 0–37)
Albumin: 3.9 g/dL (ref 3.5–5.2)
Alkaline Phosphatase: 58 U/L (ref 39–117)
Bilirubin, Direct: 0.1 mg/dL (ref 0.0–0.3)
Total Bilirubin: 0.4 mg/dL (ref 0.2–1.2)
Total Protein: 6.6 g/dL (ref 6.0–8.3)

## 2021-04-13 LAB — BASIC METABOLIC PANEL
BUN: 28 mg/dL — ABNORMAL HIGH (ref 6–23)
CO2: 27 mEq/L (ref 19–32)
Calcium: 8.9 mg/dL (ref 8.4–10.5)
Chloride: 105 mEq/L (ref 96–112)
Creatinine, Ser: 1.02 mg/dL (ref 0.40–1.50)
GFR: 75.25 mL/min (ref >60.00)
Glucose, Bld: 93 mg/dL (ref 70–99)
Potassium: 3.8 mEq/L (ref 3.5–5.1)
Sodium: 140 mEq/L (ref 135–145)

## 2021-04-13 LAB — TSH: TSH: 1.35 u[IU]/mL (ref 0.35–5.50)

## 2021-04-13 LAB — MAGNESIUM: Magnesium: 1.4 mg/dL — ABNORMAL LOW (ref 1.5–2.5)

## 2021-04-13 MED ORDER — CARVEDILOL 6.25 MG PO TABS: 6.2500 mg | ORAL_TABLET | Freq: Two times a day (BID) | ORAL | 1 refills | Status: DC

## 2021-04-13 MED ORDER — CANDESARTAN CILEXETIL 32 MG PO TABS: 32.0000 mg | ORAL_TABLET | Freq: Every day | ORAL | 1 refills | Status: DC

## 2021-04-13 MED ORDER — ROSUVASTATIN CALCIUM 20 MG PO TABS: 20.0000 mg | ORAL_TABLET | Freq: Every day | ORAL | 1 refills | Status: DC

## 2021-04-13 NOTE — Progress Notes (Signed)
Subjective:  Patient ID: Brandon [name], male    DOB: [birth year]  Age: 71 y.o. MRN: [id number]  CC: Annual Exam, Hypertension, and Hyperlipidemia  This visit occurred during the SARS-CoV-2 public health emergency.  Safety protocols were in place, including screening questions prior to the visit, additional usage of staff PPE, and extensive cleaning of exam room while observing appropriate contact time as indicated for disinfecting solutions.    HPI Brandon [name] presents for a CPX and f/up -   He is active and denies chest pain, shortness of breath, diaphoresis, dizziness, lightheadedness, edema, or palpitations.  Outpatient Medications Prior to Visit  Medication Sig Dispense Refill   aspirin 81 MG tablet Take 81 mg by mouth daily.     gabapentin (NEURONTIN) 300 MG capsule Take 1 capsule (300 mg total) by mouth at bedtime. 90 capsule 1   levothyroxine (SYNTHROID) 100 MCG tablet TAKE 1 TABLET BY MOUTH EVERY DAY 90 tablet 1   omeprazole (PRILOSEC) 40 MG capsule TAKE 1 CAPSULE BY MOUTH EVERY DAY 90 capsule 1   thiamine 100 MG tablet TAKE 1/2 TABLET DAILY 45 tablet 1   zolpidem (AMBIEN) 10 MG tablet TAKE 1 TABLET BY MOUTH EVERY DAY AT BEDTIME AS NEEDED 90 tablet 1   candesartan (ATACAND) 32 MG tablet TAKE 1 TABLET BY MOUTH DAILY. 90 tablet 1   carvedilol (COREG) 6.25 MG tablet TAKE 1 TABLET BY MOUTH 2 TIMES DAILY WITH A MEAL 180 tablet 0   Magnesium Oxide 400 MG CAPS Take 1 capsule (400 mg total) by mouth in the morning and at bedtime. 180 capsule 0   predniSONE (DELTASONE) 20 MG tablet Take 2 tablets (40 mg total) by mouth daily with breakfast. 10 tablet 0   rosuvastatin (CRESTOR) 20 MG tablet Take 1 tablet (20 mg total) by mouth daily. 90 tablet 1   No facility-administered medications prior to visit.    ROS Review of Systems  Constitutional:  Negative for chills, diaphoresis, fatigue and fever.  HENT: Negative.    Eyes: Negative.   Cardiovascular:  Negative for chest pain,  palpitations and leg swelling.  Gastrointestinal:  Negative for abdominal pain, constipation, diarrhea, nausea and vomiting.  Endocrine: Negative.   Genitourinary: Negative.  Negative for difficulty urinating.  Musculoskeletal:  Positive for arthralgias. Negative for myalgias.  Skin: Negative.   Neurological:  Negative for dizziness, weakness, light-headedness and numbness.  Hematological:  Negative for adenopathy. Does not bruise/bleed easily.  Psychiatric/Behavioral: Negative.     Objective:  BP 138/82 (BP Location: Right Arm, Patient Position: Sitting, Cuff Size: Large)    Pulse 76    Temp 98.2 F (36.8 C) (Oral)    Ht 5\' 10"  (1.778 m)    Wt 178 lb (80.7 kg)    SpO2 97%    BMI 25.54 kg/m   BP Readings from Last 3 Encounters:  04/13/21 138/82  03/14/21 (!) 142/98  03/01/21 116/70    Wt Readings from Last 3 Encounters:  04/13/21 178 lb (80.7 kg)  03/14/21 175 lb (79.4 kg)  01/31/21 177 lb (80.3 kg)    Physical Exam Vitals reviewed.  Constitutional:      Appearance: Normal appearance.  HENT:     Nose: Nose normal.     Mouth/Throat:     Mouth: Mucous membranes are moist.  Eyes:     General: No scleral icterus.    Conjunctiva/sclera: Conjunctivae normal.  Cardiovascular:     Rate and Rhythm: Normal rate and regular rhythm.  Heart sounds: Normal heart sounds, S1 normal and S2 normal.    No gallop.     Comments: EKG- NSR with 1st degree AV block, 62 bpm Unchanged No Q waves or ST/T wave changes Pulmonary:     Effort: Pulmonary effort is normal.     Breath sounds: No stridor. No wheezing, rhonchi or rales.  Abdominal:     General: Abdomen is flat.     Palpations: There is no mass.     Tenderness: There is no abdominal tenderness. There is no guarding or rebound.     Hernia: No hernia is present. There is no hernia in the left inguinal area or right inguinal area.  Genitourinary:    Pubic Area: No rash.      Penis: Normal and circumcised.      Testes: Normal.      Epididymis:     Right: Normal. Not inflamed or enlarged. No mass.     Left: Normal. Not inflamed or enlarged. No mass.     Prostate: Enlarged. Not tender and no nodules present.     Rectum: Normal. Guaiac result negative. No mass, tenderness, anal fissure, external hemorrhoid or internal hemorrhoid. Normal anal tone.  Musculoskeletal:     Cervical back: Neck supple.     Right lower leg: No edema.     Left lower leg: No edema.  Lymphadenopathy:     Cervical: No cervical adenopathy.     Lower Body: No right inguinal adenopathy. No left inguinal adenopathy.  Neurological:     Mental Status: He is alert.    Lab Results  Component Value Date   WBC 6.1 04/13/2021   HGB 13.7 04/13/2021   HCT 40.7 04/13/2021   PLT 329.0 04/13/2021   GLUCOSE 93 04/13/2021   CHOL 148 04/13/2021   TRIG 75.0 04/13/2021   HDL 80.30 04/13/2021   LDLDIRECT 147.7 12/03/2007   LDLCALC 52 04/13/2021   ALT 20 04/13/2021   AST 25 04/13/2021   NA 140 04/13/2021   K 3.8 04/13/2021   CL 105 04/13/2021   CREATININE 1.02 04/13/2021   BUN 28 (H) 04/13/2021   CO2 27 04/13/2021   TSH 1.35 04/13/2021   PSA 1.2 10/22/2019   INR 1.1 (H) 04/24/2018   HGBA1C 5.5 02/20/2017    DG Epidural/Nerve Root  Result Date: 03/01/2021 CLINICAL DATA:  Lumbar radiculopathy. Left hip pain. No significant relief following aspiration of the hip. No evidence for deep infection. FLUOROSCOPY TIME:  Radiation Exposure Index (as provided by the fluoroscopic device): 22.83 uGy*m2 PROCEDURE: SELECTIVE NERVE ROOT AND TRANSFORAMINAL EPIDURAL STEROID INJECTION: A transforaminal approach was performed on the left at the L3-4 foramen. The overlying skin was cleansed and anesthetized. A 22 gauge spinal needle was advanced into the foramen from a lateral oblique approach. Injection of 2cc of Isovue-M 200 outlined the nerve root and extended into the epidural space. No vascular or intrathecal spread is evident. I then injected 120 mg of Depo-Medrol  and 1.5 ml of 1% lidocaine. The patient tolerated the procedure without evidence for complication. The patient was observed for 20 minutes prior to discharge in stable neurologic condition. IMPRESSION: Technically successful selective nerve root block and transforaminal epidural steroid injection on theleft at the L3-4 foramen. Electronically Signed   By: San Morelle M.D.   On: 03/01/2021 08:10    Assessment & Plan:   Nimesh was seen today for annual exam, hypertension and hyperlipidemia.  Diagnoses and all orders for this visit:  Lead exposure- He  was informed that his lead level remains mildly elevated. -     Lead, blood (adult age 51 yrs or greater); Future -     CBC with Differential/Platelet; Future -     CBC with Differential/Platelet -     Lead, blood (adult age 53 yrs or greater)  Essential hypertension, benign- His blood pressure is adequately well controlled.  Will continue the current regimen. -     candesartan (ATACAND) 32 MG tablet; Take 1 tablet (32 mg total) by mouth daily. -     carvedilol (COREG) 6.25 MG tablet; Take 1 tablet (6.25 mg total) by mouth 2 (two) times daily with a meal. -     EKG 12-Lead -     Basic metabolic panel; Future -     Urinalysis, Routine w reflex microscopic; Future -     CBC with Differential/Platelet; Future -     CBC with Differential/Platelet -     Urinalysis, Routine w reflex microscopic -     Basic metabolic panel  Thoracic aortic aneurysm without rupture- Will recheck the CT on this in the next few weeks. -     carvedilol (COREG) 6.25 MG tablet; Take 1 tablet (6.25 mg total) by mouth 2 (two) times daily with a meal. -     rosuvastatin (CRESTOR) 20 MG tablet; Take 1 tablet (20 mg total) by mouth daily.  Hyperlipidemia with target LDL less than 130- LDL goal achieved. Doing well on the statin  -     rosuvastatin (CRESTOR) 20 MG tablet; Take 1 tablet (20 mg total) by mouth daily. -     Lipid panel; Future -     Hepatic function  panel; Future -     Hepatic function panel -     Lipid panel  Acquired hypothyroidism- His TSH is in the normal range.  He will stay on the current dose of levothyroxine. -     TSH; Future -     TSH  Thiamine deficiency- His B1 level is normal now. -     Vitamin B1; Future -     CBC with Differential/Platelet; Future -     CBC with Differential/Platelet -     Vitamin B1  Encounter for general adult medical examination with abnormal findings- Exam completed, labs reviewed, vaccines reviewed and updated, cancer screenings are up-to-date, patient education was given. -     PSA; Future  Hypomagnesemia- His magnesium level remains low.  Will restart the magnesium supplement. -     Magnesium; Future -     Magnesium -     Magnesium Oxide 400 MG CAPS; Take 1 capsule (400 mg total) by mouth in the morning and at bedtime.  Benign prostatic hyperplasia without lower urinary tract symptoms   I have discontinued Gildardo Griffes. [employer] "Skip"'s predniSONE. I have also changed his candesartan and carvedilol. Additionally, I am having him maintain his aspirin, thiamine, gabapentin, zolpidem, levothyroxine, omeprazole, rosuvastatin, and Magnesium Oxide.  Meds ordered this encounter  Medications   candesartan (ATACAND) 32 MG tablet    Sig: Take 1 tablet (32 mg total) by mouth daily.    Dispense:  90 tablet    Refill:  1   carvedilol (COREG) 6.25 MG tablet    Sig: Take 1 tablet (6.25 mg total) by mouth 2 (two) times daily with a meal.    Dispense:  180 tablet    Refill:  1   rosuvastatin (CRESTOR) 20 MG tablet    Sig: Take 1 tablet (  20 mg total) by mouth daily.    Dispense:  90 tablet    Refill:  1   Magnesium Oxide 400 MG CAPS    Sig: Take 1 capsule (400 mg total) by mouth in the morning and at bedtime.    Dispense:  90 capsule    Refill:  1     Follow-up: Return in about 6 months (around 10/11/2021).  Scarlette Calico, MD

## 2021-04-13 NOTE — Patient Instructions (Signed)

## 2021-04-15 MED ORDER — MAGNESIUM OXIDE 400 MG PO CAPS: 1.0000 | ORAL_CAPSULE | Freq: Two times a day (BID) | ORAL | 1 refills | Status: DC

## 2021-04-17 ENCOUNTER — Encounter: Payer: Self-pay | Admitting: Internal Medicine

## 2021-04-17 LAB — LEAD, BLOOD (ADULT >= 16 YRS): Lead: 11.8 ug/dL — ABNORMAL HIGH (ref <3.5)

## 2021-04-17 LAB — VITAMIN B1: Vitamin B1 (Thiamine): 14 nmol/L (ref 8–30)

## 2021-04-18 ENCOUNTER — Other Ambulatory Visit: Payer: Self-pay | Admitting: Internal Medicine

## 2021-04-18 DIAGNOSIS — I7121 Aneurysm of the ascending aorta, without rupture: Secondary | ICD-10-CM

## 2021-04-19 ENCOUNTER — Other Ambulatory Visit: Payer: Self-pay | Admitting: Internal Medicine

## 2021-04-19 DIAGNOSIS — I7121 Aneurysm of the ascending aorta, without rupture: Secondary | ICD-10-CM

## 2021-04-22 ENCOUNTER — Other Ambulatory Visit (HOSPITAL_COMMUNITY): Payer: Medicare HMO

## 2021-04-26 ENCOUNTER — Other Ambulatory Visit: Payer: Self-pay

## 2021-04-26 ENCOUNTER — Encounter: Payer: Self-pay | Admitting: Family Medicine

## 2021-04-26 ENCOUNTER — Ambulatory Visit: Payer: Medicare HMO | Admitting: Family Medicine

## 2021-04-26 VITALS — BP 140/98 | HR 82 | Ht 70.0 in | Wt 180.0 lb

## 2021-04-26 DIAGNOSIS — M5416 Radiculopathy, lumbar region: Secondary | ICD-10-CM

## 2021-04-26 DIAGNOSIS — M7062 Trochanteric bursitis, left hip: Secondary | ICD-10-CM

## 2021-04-26 DIAGNOSIS — M2550 Pain in unspecified joint: Secondary | ICD-10-CM

## 2021-04-26 DIAGNOSIS — M5136 Other intervertebral disc degeneration, lumbar region: Secondary | ICD-10-CM | POA: Diagnosis not present

## 2021-04-26 MED ORDER — KETOROLAC TROMETHAMINE 30 MG/ML IJ SOLN
30.0000 mg | Freq: Once | INTRAMUSCULAR | Status: AC
  Administered 2021-04-26: 30 mg via INTRAMUSCULAR

## 2021-04-26 NOTE — Progress Notes (Signed)
[place] Phone: [phone] Subjective:   Brandon Brandon [name], am serving as a scribe for Brandon Brandon [name]. This visit occurred during the SARS-CoV-2 public health emergency.  Safety protocols were in place, including screening questions prior to the visit, additional usage of staff PPE, and extensive cleaning of exam room while observing appropriate contact time as indicated for disinfecting solutions.  I'm seeing this patient by the request  of:  Brandon Lima, MD  CC: Low back pain follow-up  [id number]  03/14/2022 Patient has responded extremely well to the nerve root injection at this time.  Discussed with patient about icing regimen and home exercises.  Bone scan of the hip looks relatively good.  Discussed that we continue to have difficulty I would like to consider laboratory work-up.  Patient also could have a repeat injection if necessary.  Follow-up with me again in 4 to 8 weeks  Update 04/26/2021 Brandon Brandon [name] is a 71 y.o. male coming in with complaint of lumbar, DDD. Patient states that he is having an increase in his pain in L glute and L thigh. Symptom is mostly pain vs tingling. Epidural worked for groin pain in November 2022. Using IBU prn.       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   chemo/xrt comp 06/2009   Thyroid disease    Past Surgical History:  Procedure Laterality Date   HERNIA REPAIR     inguinal   hip replacemnt left Left    Left eye     stmach surgery     repair peg tube site   Social History   Socioeconomic History   Marital status: Married    Spouse name: Not on file   Number of children: 2   Years of education: 15   Highest education  level: Not on file  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 History   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  Tobacco Use   Smoking status: Former    Packs/day: 1.50    Years: 35.00    Pack years: 52.50    Types: Cigarettes, Cigars    Start date: 05/16/1973    Quit date: 04/19/2009    Years since quitting: 12.0   Smokeless tobacco: Never   Tobacco comments:    quit smoking 5 years sago  Substance and Sexual Activity   Alcohol use: Yes    Alcohol/week: 30.0 standard drinks    Types: 30 Shots of liquor per week   Drug use: Brandon [name]   Sexual activity: Yes    Partners: Female  Other Topics Concern   Not on file  Social History Narrative   HSG, [employer]-Brandon [name] degree. married - '76 - 5 years, divorced; remarried  '84. 1 son - '85; 1 daughter '89. 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    Social Determinants of Health   Financial Resource Strain: Not on file  Food Insecurity: Not on file  Transportation Needs: Not on file  Physical Activity: Not on file  Stress: Not on file  Social Connections: Not on file   Allergies  Allergen Reactions   Benicar [Olmesartan]  fatigue   Amlodipine Swelling   Family History  Problem Relation Age of Onset   Cancer Mother        lymphoma   Colon cancer Mother    Cancer Father        lung (smoker)   Hyperlipidemia Father    Hypertension Father    Diabetes Father    Esophageal cancer Neg Hx    Rectal cancer Neg Hx    Stomach cancer Neg Hx     Current Outpatient Medications (Endocrine & Metabolic):    levothyroxine (SYNTHROID) 100 MCG tablet, TAKE 1 TABLET BY MOUTH EVERY DAY  Current Outpatient Medications (Cardiovascular):    candesartan (ATACAND) 32 MG tablet, Take 1 tablet (32 mg total) by mouth daily.   carvedilol (COREG) 6.25 MG tablet, Take 1 tablet (6.25 mg total) by mouth 2 (two) times daily with a meal.   rosuvastatin (CRESTOR) 20 MG tablet, Take 1 tablet (20 mg total) by mouth daily.   Current Outpatient Medications  (Analgesics):    aspirin 81 MG tablet, Take 81 mg by mouth daily.   Current Outpatient Medications (Other):    gabapentin (NEURONTIN) 300 MG capsule, Take 1 capsule (300 mg total) by mouth at bedtime.   Magnesium Oxide 400 MG CAPS, Take 1 capsule (400 mg total) by mouth in the morning and at bedtime.   omeprazole (PRILOSEC) 40 MG capsule, TAKE 1 CAPSULE BY MOUTH EVERY DAY   thiamine 100 MG tablet, TAKE 1/2 TABLET DAILY   zolpidem (AMBIEN) 10 MG tablet, TAKE 1 TABLET BY MOUTH EVERY DAY AT BEDTIME AS NEEDED   Reviewed prior external information including notes and imaging from  primary care provider As well as notes that were available from care everywhere and other healthcare systems.  Past medical history, social, surgical and family history all reviewed in electronic medical record.  Brandon [name] pertanent information unless stated regarding to the chief complaint.   Review of Systems:  Brandon [name] headache, visual changes, nausea, vomiting, diarrhea, constipation, dizziness, abdominal pain, skin rash, fevers, chills, night sweats, weight loss, swollen lymph nodes,, joint swelling, chest pain, shortness of breath, mood changes. POSITIVE muscle aches, body aches  Objective  Blood pressure (!) 140/98, pulse 82, height 5\' 10"  (1.778 m), weight 180 lb (81.6 kg), SpO2 99 %.   General: Brandon [name] apparent distress alert and oriented x3 mood and affect normal, dressed appropriately.  HEENT: Pupils equal, extraocular movements intact  Respiratory: Patient's speak in full sentences and does not appear short of breath  Cardiovascular: Brandon [name] lower extremity edema, non tender, Brandon [name] erythema  Gait antalgic favoring the left hip MSK: Low back exam has some loss of lordosis.  Some worsening pain with straight leg test noted into the leg.  Patient has tenderness to palpation of the paraspinal musculature diffusely of the lower back.  Patient noted does also have severe tenderness over the greater trochanteric area.  Moderate  tenderness over the piriformis on the left side as well.  After verbal consent patient was prepped with alcohol swab and with a 21-gauge 2 inch needle stated very superficial to the greater trochanteric area and injected with 2 cc of 0.5% Marcaine and 1 cc of Kenalog 40 mg/mL.  Brandon [name] blood loss.  Band-Aid placed.  Postinjection instructions given.     Impression and Recommendations:     The above documentation has been reviewed and is accurate and complete Lyndal Pulley, DO

## 2021-04-26 NOTE — Assessment & Plan Note (Signed)
Known degenerative disc disease.  Concerned that patient is having more of a lumbar radiculopathy that could be more consistent with the L4-L5 distribution in the L3-L4 distribution.  Patient did initially respond well to the epidural 2 months ago.  We will see how patient responds to the greater trochanteric injection that was given today.  Discussed icing regimen and home exercises otherwise.  Patient still has signs and symptoms that is consistent with potentially instability of the hip that we will continue to monitor.  Follow-up again in 4 weeks.

## 2021-04-26 NOTE — Patient Instructions (Signed)
Epidural [phone] GT injection today Toradol injection in backside See me again in 4 weeks after epidural

## 2021-04-29 ENCOUNTER — Other Ambulatory Visit: Payer: Self-pay

## 2021-04-29 ENCOUNTER — Ambulatory Visit (HOSPITAL_COMMUNITY)
Admission: RE | Admit: 2021-04-29 | Discharge: 2021-04-29 | Disposition: A | Discharge status: Home / Self Care | Payer: Medicare HMO | Source: Ambulatory Visit | Attending: Internal Medicine | Admitting: Internal Medicine

## 2021-04-29 DIAGNOSIS — R911 Solitary pulmonary nodule: Secondary | ICD-10-CM | POA: Diagnosis not present

## 2021-04-29 DIAGNOSIS — I7121 Aneurysm of the ascending aorta, without rupture: Secondary | ICD-10-CM

## 2021-04-29 DIAGNOSIS — J439 Emphysema, unspecified: Secondary | ICD-10-CM | POA: Diagnosis not present

## 2021-04-29 MED ORDER — IOHEXOL 350 MG/ML SOLN
75.0000 mL | Freq: Once | INTRAVENOUS | Status: AC | PRN
  Administered 2021-04-29: 75 mL via INTRAVENOUS

## 2021-04-30 ENCOUNTER — Other Ambulatory Visit: Payer: Self-pay | Admitting: Internal Medicine

## 2021-04-30 DIAGNOSIS — G620 Drug-induced polyneuropathy: Secondary | ICD-10-CM

## 2021-04-30 DIAGNOSIS — F5101 Primary insomnia: Secondary | ICD-10-CM

## 2021-05-02 ENCOUNTER — Other Ambulatory Visit: Payer: Self-pay

## 2021-05-02 ENCOUNTER — Other Ambulatory Visit: Payer: Self-pay | Admitting: Family Medicine

## 2021-05-02 ENCOUNTER — Ambulatory Visit
Admission: RE | Admit: 2021-05-02 | Discharge: 2021-05-02 | Disposition: A | Discharge status: Home / Self Care | Payer: Medicare HMO | Source: Ambulatory Visit | Attending: Family Medicine | Admitting: Family Medicine

## 2021-05-02 DIAGNOSIS — M5416 Radiculopathy, lumbar region: Secondary | ICD-10-CM | POA: Diagnosis not present

## 2021-05-02 MED ORDER — IOPAMIDOL (ISOVUE-M 200) INJECTION 41%
1.0000 mL | Freq: Once | INTRAMUSCULAR | Status: AC
  Administered 2021-05-02: 1 mL via EPIDURAL

## 2021-05-02 MED ORDER — METHYLPREDNISOLONE ACETATE 40 MG/ML INJ SUSP (RADIOLOG
80.0000 mg | Freq: Once | INTRAMUSCULAR | Status: AC
  Administered 2021-05-02: 80 mg via EPIDURAL

## 2021-05-02 NOTE — Discharge Instructions (Signed)

## 2021-05-06 ENCOUNTER — Other Ambulatory Visit (HOSPITAL_BASED_OUTPATIENT_CLINIC_OR_DEPARTMENT_OTHER): Admitting: Neurology

## 2021-05-10 ENCOUNTER — Ambulatory Visit: Admit: 2021-05-10 | Payer: MEDICARE | Primary: Internal Medicine

## 2021-05-10 ENCOUNTER — Encounter (HOSPITAL_BASED_OUTPATIENT_CLINIC_OR_DEPARTMENT_OTHER): Admitting: Specialist

## 2021-05-10 ENCOUNTER — Other Ambulatory Visit

## 2021-05-10 ENCOUNTER — Ambulatory Visit: Admit: 2021-05-10 | Discharge: 2021-05-10 | Payer: MEDICARE | Attending: Specialist | Primary: Internal Medicine

## 2021-05-10 DIAGNOSIS — H35342 Macular cyst, hole, or pseudohole, left eye: Secondary | ICD-10-CM

## 2021-05-10 NOTE — Progress Notes (Signed)
Last visit 05/04/20    MACULAR HOLE OR PSEUDOHOLE; Left Eye (H35.342) (OS)  [facility]. Patient has had decreased vision OS since Oct 2019.  S/p PPV gas, ILM peel 05-07-18. Pre-op VA = 20/400  s/p CE/IOL in November '20  ______________________________________    Va = 20/50 sub stable, no new symptoms. Still with stable distortion, barely detectable per pt.     OCT MACULA OS 05-10-2021: hole remains closed OS, No SRF/IRF, stable. No PVD or tx OD.    Imp - [facility] OS s/p PPV 2 years ago. Excellent result.     Plan - here in one year or Dr. Renette ButtersWatanabe, sooner if new symptoms    Pseudophakia OU- doing well    PCO OS- Potential for mild VA improvement with yag cap. Unclear of exact potential given h/o mac hole OS.  Pt content with vision for now. Continue to monitor      F/U: 1 Year OCT OU [name] [name]

## 2021-05-12 ENCOUNTER — Ambulatory Visit: Payer: Medicare HMO | Admitting: Internal Medicine

## 2021-05-28 ENCOUNTER — Other Ambulatory Visit: Payer: Self-pay | Admitting: Internal Medicine

## 2021-05-28 DIAGNOSIS — E039 Hypothyroidism, unspecified: Secondary | ICD-10-CM

## 2021-06-03 NOTE — Progress Notes (Signed)
Brandon [name] [address] [place] Phone: [phone] Subjective:   IVilma [name], am serving as a scribe for Dr. Hulan Saas. This visit occurred during the SARS-CoV-2 public health emergency.  Safety protocols were in place, including screening questions prior to the visit, additional usage of staff PPE, and extensive cleaning of exam room while observing appropriate contact time as indicated for disinfecting solutions.   I'm seeing this patient by the request  of:  [name], MD  CC: Left leg and back pain follow-up  [id number]  04/26/2021 Known degenerative disc disease.  Concerned that patient is having more of a lumbar radiculopathy that could be more consistent with the L4-L5 distribution in the L3-L4 distribution.  Patient did initially respond well to the epidural 2 months ago.  We will see how patient responds to the greater trochanteric injection that was given today.  Discussed icing regimen and home exercises otherwise.  Patient still has signs and symptoms that is consistent with potentially instability of the hip that we will continue to monitor.  Follow-up again in 4 weeks.  Update 06/03/2021 Brandon [name] is a 71 y.o. male coming in with complaint of lumbar radiculopathy and L hip pain. Patient states epi helped a little. Usually still putting biofreeze on first thing in the morning. No new complaints.  Patient states overall is feeling better.  Patient states that today is a really good day.  Still has many days where he does need to put topical medicine on it and walks with a limp.  Patient does not know what else to do.  Has been able to work a little longer at this time.       Past Medical History:  Diagnosis Date   Allergy    mild   Aortic aneurysm (HCC)    small and watching   GERD (gastroesophageal reflux disease)    Head and neck cancer    head and neck ca dx 03/2009   Hyperlipidemia    Hypertension     oropharyngeal ca dx'd 03/2009   chemo/xrt comp 06/2009   Thyroid disease    Past Surgical History:  Procedure Laterality Date   HERNIA REPAIR     inguinal   hip replacemnt left Left    Left eye     stmach surgery     repair peg tube site   Social History   Socioeconomic History   Marital status: Married    Spouse name: Not on file   Number of children: 2   Years of education: 15   Highest education level: Not on file  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 History   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 married - '76 - 46 years, divorced; remarried  '  32. 1 son - '85; 1 daughter '89. 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    Social Determinants of Health   Financial Resource Strain: Not on file  Food Insecurity: Not on file  Transportation Needs: Not on file  Physical Activity: Not on file  Stress: Not on file  Social Connections: Not on file   Allergies  Allergen Reactions   Benicar [Olmesartan]     fatigue   Amlodipine Swelling   Family History  Problem Relation Age of Onset   Cancer Mother        lymphoma   Colon cancer Mother    Cancer Father        lung (smoker)   Hyperlipidemia Father    Hypertension Father    Diabetes Father    Esophageal cancer Neg Hx    Rectal cancer Neg Hx    Stomach cancer Neg Hx     Current Outpatient Medications (Endocrine & Metabolic):    levothyroxine  (SYNTHROID) 100 MCG tablet, TAKE 1 TABLET BY MOUTH EVERY DAY  Current Outpatient Medications (Cardiovascular):    candesartan (ATACAND) 32 MG tablet, Take 1 tablet (32 mg total) by mouth daily.   carvedilol (COREG) 6.25 MG tablet, Take 1 tablet (6.25 mg total) by mouth 2 (two) times daily with a meal.   rosuvastatin (CRESTOR) 20 MG tablet, Take 1 tablet (20 mg total) by mouth daily.   Current Outpatient Medications (Analgesics):    aspirin 81 MG tablet, Take 81 mg by mouth daily.   Current Outpatient Medications (Other):    gabapentin (NEURONTIN) 300 MG capsule, TAKE 1 CAPSULE BY MOUTH EVERYDAY AT BEDTIME   Magnesium Oxide 400 MG CAPS, Take 1 capsule (400 mg total) by mouth in the morning and at bedtime.   omeprazole (PRILOSEC) 40 MG capsule, TAKE 1 CAPSULE BY MOUTH EVERY DAY   thiamine 100 MG tablet, TAKE 1/2 TABLET DAILY   zolpidem (AMBIEN) 10 MG tablet, TAKE 1 TABLET BY MOUTH EVERY DAY AT BEDTIME AS NEEDED   Reviewed prior external information including notes and imaging from  primary care provider As well as notes that were available from care everywhere and other healthcare systems.  Past medical history, social, surgical and family history all reviewed in electronic medical record.  No pertanent information unless stated regarding to the chief complaint.   Review of Systems:  No headache, visual changes, nausea, vomiting, diarrhea, constipation, dizziness, abdominal pain, skin rash, fevers, chills, night sweats, weight loss, swollen lymph nodes, bjoint swelling, chest pain, shortness of breath, mood changes. POSITIVE muscle aches, body aches  Objective  Blood pressure 128/86, pulse 82, height 5\' 10"  (1.778 m), weight 177 lb (80.3 kg), SpO2 99 %.   General: No apparent distress alert and oriented x3 mood and affect normal, dressed appropriately.  HEENT: Pupils equal, extraocular movements intact  Respiratory: Patient's speak in full sentences and does not appear short of breath   Cardiovascular: No lower extremity edema, non tender, no erythema  Gait normal with good balance and coordination.  MSK: Left hip does have some mild atrophy compared to the contralateral side.  Patient back exam very minimal tenderness to palpation today.    Impression and Recommendations:     The above documentation has been reviewed and is accurate and complete Lyndal Pulley, DO

## 2021-06-06 ENCOUNTER — Ambulatory Visit: Payer: Medicare HMO | Admitting: Family Medicine

## 2021-06-06 ENCOUNTER — Other Ambulatory Visit: Payer: Self-pay

## 2021-06-06 DIAGNOSIS — M5136 Other intervertebral disc degeneration, lumbar region: Secondary | ICD-10-CM | POA: Diagnosis not present

## 2021-06-06 NOTE — Assessment & Plan Note (Signed)
Patient initially did not think it made significant improvement but today is having significant improvement.  Discussed icing regimen and home exercises, patient wants to not do anything but would consider the possibility of repeating the injections at a lower place.  Patient does have the left hip replacement and is still concerned about this as well.  Discussed increasing in activities and home exercises.  Follow-up again in 6 to 8 weeks

## 2021-06-21 ENCOUNTER — Encounter: Payer: Self-pay | Admitting: Family Medicine

## 2021-06-27 NOTE — Telephone Encounter (Signed)
Patient called back to follow up on the epidural order. ? ?

## 2021-06-28 ENCOUNTER — Other Ambulatory Visit: Payer: Self-pay

## 2021-06-28 DIAGNOSIS — M5416 Radiculopathy, lumbar region: Secondary | ICD-10-CM

## 2021-06-29 ENCOUNTER — Other Ambulatory Visit: Payer: Self-pay

## 2021-06-29 ENCOUNTER — Other Ambulatory Visit: Payer: Self-pay | Admitting: Family Medicine

## 2021-06-29 DIAGNOSIS — M5416 Radiculopathy, lumbar region: Secondary | ICD-10-CM

## 2021-07-11 ENCOUNTER — Ambulatory Visit
Admission: RE | Admit: 2021-07-11 | Discharge: 2021-07-11 | Disposition: A | Discharge status: Home / Self Care | Payer: Medicare HMO | Source: Ambulatory Visit | Attending: Family Medicine | Admitting: Family Medicine

## 2021-07-11 DIAGNOSIS — M5416 Radiculopathy, lumbar region: Secondary | ICD-10-CM

## 2021-07-11 DIAGNOSIS — M47816 Spondylosis without myelopathy or radiculopathy, lumbar region: Secondary | ICD-10-CM | POA: Diagnosis not present

## 2021-07-11 MED ORDER — IOPAMIDOL (ISOVUE-M 200) INJECTION 41%
1.0000 mL | Freq: Once | INTRAMUSCULAR | Status: AC
  Administered 2021-07-11: 1 mL via EPIDURAL

## 2021-07-11 MED ORDER — METHYLPREDNISOLONE ACETATE 40 MG/ML INJ SUSP (RADIOLOG
80.0000 mg | Freq: Once | INTRAMUSCULAR | Status: AC
  Administered 2021-07-11: 80 mg via EPIDURAL

## 2021-07-11 NOTE — Discharge Instructions (Signed)

## 2021-07-21 LAB — PSA SCREENING (EXT): PSA (EXT): 4.2 ng/mL (ref 0.0–5.0)

## 2021-07-21 LAB — HEMOGLOBIN A1C: HEMOGLOBIN A1C % (INT/EXT): 5.1 % (ref 4.6–5.6)

## 2021-07-28 ENCOUNTER — Other Ambulatory Visit (INDEPENDENT_AMBULATORY_CARE_PROVIDER_SITE_OTHER): Payer: Medicare HMO

## 2021-07-28 DIAGNOSIS — N400 Enlarged prostate without lower urinary tract symptoms: Secondary | ICD-10-CM | POA: Diagnosis not present

## 2021-07-28 LAB — PSA: PSA: 1.11 ng/mL (ref 0.10–4.00)

## 2021-07-29 ENCOUNTER — Encounter: Payer: Self-pay | Admitting: Internal Medicine

## 2021-07-29 ENCOUNTER — Other Ambulatory Visit: Payer: Self-pay | Admitting: Internal Medicine

## 2021-07-29 DIAGNOSIS — F5101 Primary insomnia: Secondary | ICD-10-CM

## 2021-07-29 NOTE — Telephone Encounter (Signed)
Check [place] registry last filled 05/01/2021.Marland KitchenJohny [name] ? ?

## 2021-08-08 DIAGNOSIS — X32XXXD Exposure to sunlight, subsequent encounter: Secondary | ICD-10-CM | POA: Diagnosis not present

## 2021-08-08 DIAGNOSIS — D225 Melanocytic nevi of trunk: Secondary | ICD-10-CM | POA: Diagnosis not present

## 2021-08-08 DIAGNOSIS — D485 Neoplasm of uncertain behavior of skin: Secondary | ICD-10-CM | POA: Diagnosis not present

## 2021-08-08 DIAGNOSIS — L570 Actinic keratosis: Secondary | ICD-10-CM | POA: Diagnosis not present

## 2021-08-19 DIAGNOSIS — H40053 Ocular hypertension, bilateral: Secondary | ICD-10-CM | POA: Diagnosis not present

## 2021-08-19 DIAGNOSIS — H25811 Combined forms of age-related cataract, right eye: Secondary | ICD-10-CM | POA: Diagnosis not present

## 2021-08-19 DIAGNOSIS — Z961 Presence of intraocular lens: Secondary | ICD-10-CM | POA: Diagnosis not present

## 2021-08-19 DIAGNOSIS — H43811 Vitreous degeneration, right eye: Secondary | ICD-10-CM | POA: Diagnosis not present

## 2021-08-19 DIAGNOSIS — H26492 Other secondary cataract, left eye: Secondary | ICD-10-CM | POA: Diagnosis not present

## 2021-08-25 ENCOUNTER — Other Ambulatory Visit: Payer: Self-pay | Admitting: Internal Medicine

## 2021-08-25 DIAGNOSIS — K219 Gastro-esophageal reflux disease without esophagitis: Secondary | ICD-10-CM

## 2021-08-25 NOTE — Progress Notes (Signed)
Brandon [name] [place] [address] [place] Phone: [phone] Subjective:   Brandon [name], am serving as a scribe for Dr. Hulan Saas. This visit occurred during the SARS-CoV-2 public health emergency.  Safety protocols were in place, including screening questions prior to the visit, additional usage of staff PPE, and extensive cleaning of exam room while observing appropriate contact time as indicated for disinfecting solutions.   I'm seeing this patient by the request  of:  [name], MD  CC: Back pain follow-up  PNT:[id number]  06/06/2021 Patient initially did not think it made significant improvement but today is having significant improvement.  Discussed icing regimen and home exercises, patient wants to not do anything but would consider the possibility of repeating the injections at a lower place.  Patient does have the left hip replacement and is still concerned about this as well.  Discussed increasing in activities and home exercises.  Follow-up again in 6 to 8 weeks  Update 08/26/2021 Brandon [name] is a 71 y.o. male coming in with complaint of lumbar, DDD. Patient states doing better. Injection didn't help right away but seem to be helping now. Intensity of pain has decreased as well.     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   chemo/xrt comp 06/2009   Thyroid disease    Past Surgical History:  Procedure Laterality Date   HERNIA REPAIR     inguinal   hip replacemnt left Left    Left eye     stmach surgery     repair peg tube site   Social History   Socioeconomic History   Marital status: Married    Spouse name: Not on file   Number of children: 2   Years of education: 15   Highest  education level: Not on file  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 History   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 married - '76 - 5 years, divorced; remarried  '84. 1 son - '85; 1 daughter '89. 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    Social Determinants of Health   Financial Resource Strain: Not on file  Food Insecurity: Not on file  Transportation Needs: Not on file  Physical Activity: Not on file  Stress: Not on file  Social Connections: Not on file   Allergies  Allergen Reactions   Benicar [Olmesartan]     fatigue   Amlodipine Swelling   Family  History  Problem Relation Age of Onset   Cancer Mother        lymphoma   Colon cancer Mother    Cancer Father        lung (smoker)   Hyperlipidemia Father    Hypertension Father    Diabetes Father    Esophageal cancer Neg Hx    Rectal cancer Neg Hx    Stomach cancer Neg Hx     Current Outpatient Medications (Endocrine & Metabolic):    levothyroxine (SYNTHROID) 100 MCG tablet, TAKE 1 TABLET BY MOUTH EVERY DAY  Current Outpatient Medications (Cardiovascular):    candesartan (ATACAND) 32 MG tablet, Take 1 tablet (32 mg total) by mouth daily.   carvedilol (COREG) 6.25 MG tablet, Take 1 tablet (6.25 mg total) by mouth 2 (two) times daily with a meal.   rosuvastatin (CRESTOR) 20 MG tablet, Take 1 tablet (20 mg total) by mouth daily.   Current Outpatient  Medications (Analgesics):    aspirin 81 MG tablet, Take 81 mg by mouth daily.   Current Outpatient Medications (Other):    gabapentin (NEURONTIN) 300 MG capsule, TAKE 1 CAPSULE BY MOUTH EVERYDAY AT BEDTIME   Magnesium Oxide 400 MG CAPS, Take 1 capsule (400 mg total) by mouth in the morning and at bedtime.   omeprazole (PRILOSEC) 40 MG capsule, TAKE 1 CAPSULE BY MOUTH EVERY DAY   thiamine 100 MG tablet, TAKE 1/2 TABLET DAILY   zolpidem (AMBIEN) 10 MG tablet, TAKE 1 TABLET BY MOUTH EVERY DAY AT BEDTIME AS NEEDED    Review of Systems:  No headache, visual changes, nausea, vomiting, diarrhea, constipation, dizziness, abdominal pain, skin rash, fevers, chills, night sweats, weight loss, swollen lymph nodes, body aches, joint swelling, chest pain, shortness of breath, mood changes. POSITIVE muscle aches, body aches  Objective  Blood pressure 140/88, pulse 60, height '5\' 10"'$  (1.778 m), weight 183 lb (83 kg), SpO2 99 %.   General: No apparent distress alert and oriented x3 mood and affect normal, dressed appropriately.  HEENT: Pupils equal, extraocular movements intact  Respiratory: Patient's speak in full sentences and does not appear short of breath  Cardiovascular: No lower extremity edema, non tender, no erythema  Gait antalgic MSK: Still tightness of the left hip compared to the contralateral side.  Neurovascular intact distally.    Impression and Recommendations:    The above documentation has been reviewed and is accurate and complete Lyndal Pulley, DO

## 2021-08-26 ENCOUNTER — Ambulatory Visit (INDEPENDENT_AMBULATORY_CARE_PROVIDER_SITE_OTHER): Payer: Medicare HMO | Admitting: Family Medicine

## 2021-08-26 DIAGNOSIS — M5136 Other intervertebral disc degeneration, lumbar region: Secondary | ICD-10-CM | POA: Diagnosis not present

## 2021-08-26 NOTE — Patient Instructions (Signed)
Good to see you See me when you need me 

## 2021-08-26 NOTE — Assessment & Plan Note (Signed)
Patient is responding extremely better to the history of injection.  Encourage patient to continue to be active.  This is the first time he has been able to start building the deck around his pool in over a year.  Hopefully patient will continue to do well.  We will discuss continuing the home exercises.  No medication changes.  Follow-up as needed.  Patient can write if we do need to repeat the injections.

## 2021-09-19 ENCOUNTER — Encounter: Payer: Self-pay | Admitting: Family Medicine

## 2021-09-20 ENCOUNTER — Other Ambulatory Visit: Payer: Self-pay

## 2021-09-20 DIAGNOSIS — M5412 Radiculopathy, cervical region: Secondary | ICD-10-CM

## 2021-10-03 ENCOUNTER — Ambulatory Visit
Admission: RE | Admit: 2021-10-03 | Discharge: 2021-10-03 | Disposition: A | Discharge status: Home / Self Care | Payer: Medicare HMO | Source: Ambulatory Visit | Attending: Family Medicine | Admitting: Family Medicine

## 2021-10-03 DIAGNOSIS — M5412 Radiculopathy, cervical region: Secondary | ICD-10-CM

## 2021-10-03 MED ORDER — TRIAMCINOLONE ACETONIDE 40 MG/ML IJ SUSP (RADIOLOGY)
60.0000 mg | Freq: Once | INTRAMUSCULAR | Status: AC
  Administered 2021-10-03: 60 mg via EPIDURAL

## 2021-10-03 MED ORDER — IOPAMIDOL (ISOVUE-M 300) INJECTION 61%
1.0000 mL | Freq: Once | INTRAMUSCULAR | Status: AC
  Administered 2021-10-03: 1 mL via EPIDURAL

## 2021-10-04 ENCOUNTER — Other Ambulatory Visit: Payer: Self-pay

## 2021-10-04 DIAGNOSIS — M25552 Pain in left hip: Secondary | ICD-10-CM

## 2021-10-04 MED ORDER — PREDNISONE 20 MG PO TABS: 40.0000 mg | ORAL_TABLET | Freq: Every day | ORAL | 0 refills | Status: DC

## 2021-10-05 ENCOUNTER — Other Ambulatory Visit: Payer: Self-pay | Admitting: Internal Medicine

## 2021-10-05 DIAGNOSIS — I712 Thoracic aortic aneurysm, without rupture, unspecified: Secondary | ICD-10-CM

## 2021-10-05 DIAGNOSIS — I10 Essential (primary) hypertension: Secondary | ICD-10-CM

## 2021-10-05 DIAGNOSIS — E785 Hyperlipidemia, unspecified: Secondary | ICD-10-CM

## 2021-10-08 ENCOUNTER — Other Ambulatory Visit: Payer: Self-pay | Admitting: Internal Medicine

## 2021-10-08 DIAGNOSIS — G620 Drug-induced polyneuropathy: Secondary | ICD-10-CM

## 2021-10-12 ENCOUNTER — Ambulatory Visit (INDEPENDENT_AMBULATORY_CARE_PROVIDER_SITE_OTHER): Payer: Medicare HMO | Admitting: Internal Medicine

## 2021-10-12 ENCOUNTER — Encounter: Payer: Self-pay | Admitting: Internal Medicine

## 2021-10-12 VITALS — BP 168/90 | HR 64 | Temp 97.9°F | Resp 16 | Ht 70.0 in | Wt 181.0 lb

## 2021-10-12 DIAGNOSIS — Z77011 Contact with and (suspected) exposure to lead: Secondary | ICD-10-CM | POA: Diagnosis not present

## 2021-10-12 DIAGNOSIS — N1831 Chronic kidney disease, stage 3a: Secondary | ICD-10-CM | POA: Diagnosis not present

## 2021-10-12 DIAGNOSIS — N3289 Other specified disorders of bladder: Secondary | ICD-10-CM

## 2021-10-12 DIAGNOSIS — Z23 Encounter for immunization: Secondary | ICD-10-CM | POA: Diagnosis not present

## 2021-10-12 DIAGNOSIS — E039 Hypothyroidism, unspecified: Secondary | ICD-10-CM

## 2021-10-12 DIAGNOSIS — I10 Essential (primary) hypertension: Secondary | ICD-10-CM | POA: Diagnosis not present

## 2021-10-12 LAB — BASIC METABOLIC PANEL
BUN: 55 mg/dL — ABNORMAL HIGH (ref 6–23)
CO2: 24 mEq/L (ref 19–32)
Calcium: 9.3 mg/dL (ref 8.4–10.5)
Chloride: 107 mEq/L (ref 96–112)
Creatinine, Ser: 1.33 mg/dL (ref 0.40–1.50)
GFR: 54.54 mL/min — ABNORMAL LOW (ref >60.00)
Glucose, Bld: 100 mg/dL — ABNORMAL HIGH (ref 70–99)
Potassium: 4.3 mEq/L (ref 3.5–5.1)
Sodium: 142 mEq/L (ref 135–145)

## 2021-10-12 LAB — CBC WITH DIFFERENTIAL/PLATELET
Basophils Absolute: 0.1 10*3/uL (ref 0.0–0.1)
Basophils Relative: 1.2 % (ref 0.0–3.0)
Eosinophils Absolute: 0.3 10*3/uL (ref 0.0–0.7)
Eosinophils Relative: 3.1 % (ref 0.0–5.0)
HCT: 39.6 % (ref 39.0–52.0)
Hemoglobin: 13.1 g/dL (ref 13.0–17.0)
Lymphocytes Relative: 13.8 % (ref 12.0–46.0)
Lymphs Abs: 1.4 10*3/uL (ref 0.7–4.0)
MCHC: 33 g/dL (ref 30.0–36.0)
MCV: 93.1 fl (ref 78.0–100.0)
Monocytes Absolute: 1 10*3/uL (ref 0.1–1.0)
Monocytes Relative: 9.7 % (ref 3.0–12.0)
Neutro Abs: 7.5 10*3/uL (ref 1.4–7.7)
Neutrophils Relative %: 72.2 % (ref 43.0–77.0)
Platelets: 314 10*3/uL (ref 150.0–400.0)
RBC: 4.26 Mil/uL (ref 4.22–5.81)
RDW: 15.6 % — ABNORMAL HIGH (ref 11.5–15.5)
WBC: 10.3 10*3/uL (ref 4.0–10.5)

## 2021-10-12 LAB — URINALYSIS, ROUTINE W REFLEX MICROSCOPIC
Bilirubin Urine: NEGATIVE
Hgb urine dipstick: NEGATIVE
Ketones, ur: NEGATIVE
Leukocytes,Ua: NEGATIVE
Nitrite: NEGATIVE
RBC / HPF: NONE SEEN (ref 0–?)
Specific Gravity, Urine: 1.025 (ref 1.000–1.030)
Total Protein, Urine: NEGATIVE
Urine Glucose: NEGATIVE
Urobilinogen, UA: 0.2 (ref 0.0–1.0)
pH: 5.5 (ref 5.0–8.0)

## 2021-10-12 LAB — MAGNESIUM: Magnesium: 1.3 mg/dL — ABNORMAL LOW (ref 1.5–2.5)

## 2021-10-12 LAB — TSH: TSH: 4.46 u[IU]/mL (ref 0.35–5.50)

## 2021-10-12 MED ORDER — SHINGRIX 50 MCG/0.5ML IM SUSR
0.5000 mL | Freq: Once | INTRAMUSCULAR | 1 refills | Status: AC
Start: 2021-10-12 — End: 2021-10-12

## 2021-10-12 MED ORDER — MAGNESIUM OXIDE 400 MG PO CAPS: 1.0000 | ORAL_CAPSULE | Freq: Two times a day (BID) | ORAL | 1 refills | Status: DC

## 2021-10-12 NOTE — Progress Notes (Signed)
Subjective:  Patient ID: Brandon [name], male    DOB: 1950-07-06  Age: 71 y.o. MRN: [id number]  CC: Hypertension and Hypothyroidism   HPI HASTON CASEBOLT presents for f/up -  He has recently been taking ibuprofen (duexis) for musculoskeletal pain.  He is active and denies chest pain, shortness of breath, diaphoresis, dizziness, or edema.  Outpatient Medications Prior to Visit  Medication Sig Dispense Refill   aspirin 81 MG tablet Take 81 mg by mouth daily.     candesartan (ATACAND) 32 MG tablet TAKE 1 TABLET BY MOUTH DAILY. 90 tablet 0   carvedilol (COREG) 6.25 MG tablet TAKE 1 TABLET BY MOUTH 2 TIMES DAILY WITH A MEAL. 180 tablet 0   gabapentin (NEURONTIN) 300 MG capsule TAKE 1 CAPSULE BY MOUTH EVERYDAY AT BEDTIME 90 capsule 1   levothyroxine (SYNTHROID) 100 MCG tablet TAKE 1 TABLET BY MOUTH EVERY DAY 90 tablet 1   omeprazole (PRILOSEC) 40 MG capsule TAKE 1 CAPSULE BY MOUTH EVERY DAY 90 capsule 1   rosuvastatin (CRESTOR) 20 MG tablet TAKE 1 TABLET BY MOUTH EVERY DAY 90 tablet 0   thiamine 100 MG tablet TAKE 1/2 TABLET DAILY 45 tablet 1   zolpidem (AMBIEN) 10 MG tablet TAKE 1 TABLET BY MOUTH EVERY DAY AT BEDTIME AS NEEDED 90 tablet 0   Magnesium Oxide 400 MG CAPS Take 1 capsule (400 mg total) by mouth in the morning and at bedtime. 90 capsule 1   predniSONE (DELTASONE) 20 MG tablet Take 2 tablets (40 mg total) by mouth daily with breakfast. 10 tablet 0   No facility-administered medications prior to visit.    ROS Review of Systems  Constitutional:  Negative for chills, diaphoresis, fatigue and fever.  HENT: Negative.    Eyes: Negative.   Respiratory: Negative.  Negative for cough, chest tightness, shortness of breath and wheezing.   Cardiovascular:  Negative for chest pain, palpitations and leg swelling.  Gastrointestinal:  Negative for abdominal pain, constipation, diarrhea, nausea and vomiting.  Endocrine: Negative.  Negative for cold intolerance and heat intolerance.   Genitourinary: Negative.  Negative for difficulty urinating.  Musculoskeletal:  Positive for back pain. Negative for myalgias.  Skin: Negative.     Objective:  BP (!) 168/90 (BP Location: Left Arm, Patient Position: Sitting)   Pulse 64   Temp 97.9 F (36.6 C) (Oral)   Resp 16   Ht '5\' 10"'$  (1.778 m)   Wt 181 lb (82.1 kg)   SpO2 99%   BMI 25.97 kg/m   BP Readings from Last 3 Encounters:  10/12/21 (!) 168/90  10/03/21 124/72  08/26/21 140/88    Wt Readings from Last 3 Encounters:  10/12/21 181 lb (82.1 kg)  08/26/21 183 lb (83 kg)  06/06/21 177 lb (80.3 kg)    Physical Exam Vitals reviewed.  HENT:     Nose: Nose normal.     Mouth/Throat:     Mouth: Mucous membranes are moist.  Eyes:     General: No scleral icterus.    Conjunctiva/sclera: Conjunctivae normal.  Cardiovascular:     Rate and Rhythm: Normal rate and regular rhythm.     Heart sounds: No murmur heard. Pulmonary:     Breath sounds: No stridor. No wheezing, rhonchi or rales.  Abdominal:     General: Abdomen is flat.     Palpations: There is no mass.     Tenderness: There is no abdominal tenderness. There is no guarding.     Hernia: No hernia is  present.  Musculoskeletal:        General: Normal range of motion.     Cervical back: Neck supple.     Right lower leg: No edema.     Left lower leg: No edema.  Lymphadenopathy:     Cervical: No cervical adenopathy.  Skin:    General: Skin is warm and dry.  Neurological:     General: No focal deficit present.     Mental Status: He is alert.  Psychiatric:        Mood and Affect: Mood normal.        Behavior: Behavior normal.     Lab Results  Component Value Date   WBC 10.3 10/12/2021   HGB 13.1 10/12/2021   HCT 39.6 10/12/2021   PLT 314.0 10/12/2021   GLUCOSE 100 (H) 10/12/2021   CHOL 148 04/13/2021   TRIG 75.0 04/13/2021   HDL 80.30 04/13/2021   LDLDIRECT 147.7 12/03/2007   LDLCALC 52 04/13/2021   ALT 20 04/13/2021   AST 25 04/13/2021   NA  142 10/12/2021   K 4.3 10/12/2021   CL 107 10/12/2021   CREATININE 1.33 10/12/2021   BUN 55 (H) 10/12/2021   CO2 24 10/12/2021   TSH 4.46 10/12/2021   PSA 1.11 07/28/2021   INR 1.1 (H) 04/24/2018   HGBA1C 5.5 02/20/2017    DG INJECT DIAG/THERA/INC NEEDLE/CATH/PLC EPI/CERV/THOR W/IMG  Result Date: 10/03/2021 CLINICAL DATA:  Cervical radiculopathy. FLUOROSCOPY: Radiation Exposure Index (as provided by the fluoroscopic device): Dose area product 15.72 uGy*m2 PROCEDURE: CERVICAL EPIDURAL INJECTION An interlaminar approach was performed on the left at C6-7. A 20 gauge epidural needle was advanced using loss-of-resistance technique. DIAGNOSTIC EPIDURAL INJECTION Injection of Isovue-M 300 shows a good epidural pattern with spread above and below the level of needle placement, primarily on the left. No vascular opacification is seen. THERAPEUTIC EPIDURAL INJECTION 1.5 ml of Kenalog 40 mixed with 1 ml of 1% Lidocaine and 2 ml of normal saline were then instilled. The procedure was well-tolerated, and the patient was discharged thirty minutes following the injection in good condition. IMPRESSION: Technically successful first epidural injection on the left at C6-7. Electronically Signed   By: San Morelle M.D.   On: 10/03/2021 13:55   US Renal  Result Date: 10/20/2021 CLINICAL DATA:  Renal disease. EXAM: RENAL / URINARY TRACT ULTRASOUND COMPLETE COMPARISON:  None Available. FINDINGS: Right Kidney: Renal measurements: 12.4 x 5.8 x 5.1 cm = volume: 190.9 mL. Echogenicity within normal limits. No mass or hydronephrosis visualized. Left Kidney: Renal measurements: 13.0 x 6.0 x 6 0 cm = volume: 245.2 mL. Echogenicity within normal limits. No mass or hydronephrosis visualized. Bladder: There is focal nodularity along the posterior urinary bladder wall, nonspecific. Other: Increased hepatic parenchymal echogenicity. IMPRESSION: 1. There is suggestion of focal nodularity involving the posterior wall of the  urinary bladder, nonspecific in etiology. While this may potentially represent debris, urinary bladder wall mass is not excluded. Consider further evaluation with either pre and post contrast-enhanced CT or with direct visualization. 2. No hydronephrosis. 3. Increased hepatic parenchymal echogenicity suggestive of steatosis. 4. These results will be called to the ordering clinician or representative by the Radiologist Assistant, and communication documented in the PACS or [employer]. Electronically Signed   By: Lovey Newcomer M.D.   On: 10/20/2021 08:41   DG INJECT DIAG/THERA/INC NEEDLE/CATH/PLC EPI/CERV/THOR W/IMG  Result Date: 10/03/2021 CLINICAL DATA:  Cervical radiculopathy. FLUOROSCOPY: Radiation Exposure Index (as provided by the fluoroscopic device): Dose area product  15.72 uGy*m2 PROCEDURE: CERVICAL EPIDURAL INJECTION An interlaminar approach was performed on the left at C6-7. A 20 gauge epidural needle was advanced using loss-of-resistance technique. DIAGNOSTIC EPIDURAL INJECTION Injection of Isovue-M 300 shows a good epidural pattern with spread above and below the level of needle placement, primarily on the left. No vascular opacification is seen. THERAPEUTIC EPIDURAL INJECTION 1.5 ml of Kenalog 40 mixed with 1 ml of 1% Lidocaine and 2 ml of normal saline were then instilled. The procedure was well-tolerated, and the patient was discharged thirty minutes following the injection in good condition. IMPRESSION: Technically successful first epidural injection on the left at C6-7. Electronically Signed   By: San Morelle M.D.   On: 10/03/2021 13:55     Assessment & Plan:   Aquiles was seen today for hypertension and hypothyroidism.  Diagnoses and all orders for this visit:  Essential hypertension, benign- His blood pressure is not adequately well controlled and his renal function is declining.  I recommended that he avoid all NSAIDs and I anticipate this will help him reach his blood  pressure goal of about 130/80. -     CBC with Differential/Platelet; Future -     Basic metabolic panel; Future -     Aldosterone + renin activity w/ ratio; Future -     Urinalysis, Routine w reflex microscopic; Future -     Urinalysis, Routine w reflex microscopic -     Aldosterone + renin activity w/ ratio -     Basic metabolic panel -     CBC with Differential/Platelet -     US Renal; Future  Acquired hypothyroidism- He is euthyroid. -     TSH; Future -     TSH  Hypomagnesemia- His magnesium level is normal. -     Magnesium; Future -     Magnesium -     Magnesium Oxide 400 MG CAPS; Take 1 capsule (400 mg total) by mouth in the morning and at bedtime.  Need for prophylactic vaccination and inoculation against varicella -     Zoster Vaccine Adjuvanted [facility]) injection; Inject 0.5 mLs into the muscle once for 1 dose.  Lead exposure- His lead level remains elevated. -     Lead, blood (adult age 42 yrs or greater); Future -     Lead, blood (adult age 7 yrs or greater)  Stage 3a chronic kidney disease ([place])- His renal function has declined.  The ultrasound is revealing only for bladder mass.  There is no evidence of renal artery stenosis or renal asymmetry.         -     US Renal; Future  Bladder mass -     Ambulatory referral to Urology   I have discontinued Gildardo Griffes. Friberg "Skip"'s predniSONE. I am also having him start on Shingrix. Additionally, I am having him maintain his aspirin, thiamine, levothyroxine, zolpidem, omeprazole, carvedilol, rosuvastatin, candesartan, gabapentin, and Magnesium Oxide.  Meds ordered this encounter  Medications   Zoster Vaccine Adjuvanted [facility]) injection    Sig: Inject 0.5 mLs into the muscle once for 1 dose.    Dispense:  0.5 mL    Refill:  1   Magnesium Oxide 400 MG CAPS    Sig: Take 1 capsule (400 mg total) by mouth in the morning and at bedtime.    Dispense:  90 capsule    Refill:  1   I spent 50 minutes in preparing to see  the patient by review of recent labs and imaging,  obtaining and reviewing separately obtained history, communicating with the patient, ordering an xray, and documenting clinical information in the EHR including the differential Dx, treatment, and any further evaluation and management of multiple complex medical issues.     Follow-up: Return in about 3 months (around 01/12/2022).  Scarlette Calico, MD

## 2021-10-12 NOTE — Patient Instructions (Signed)
Hypertension, Adult High blood pressure (hypertension) is when the force of blood pumping through the arteries is too strong. The arteries are the blood vessels that carry blood from the heart throughout the body. Hypertension forces the heart to work harder to pump blood and may cause arteries to become narrow or stiff. Untreated or uncontrolled hypertension can lead to a heart attack, heart failure, a stroke, kidney disease, and other problems. A blood pressure reading consists of a higher number over a lower number. Ideally, your blood pressure should be below 120/80. The first ("top") number is called the systolic pressure. It is a measure of the pressure in your arteries as your heart beats. The second ("bottom") number is called the diastolic pressure. It is a measure of the pressure in your arteries as the heart relaxes. What are the causes? The exact cause of this condition is not known. There are some conditions that result in high blood pressure. What increases the risk? Certain factors may make you more likely to develop high blood pressure. Some of these risk factors are under your control, including: Smoking. Not getting enough exercise or physical activity. Being overweight. Having too much fat, sugar, calories, or salt (sodium) in your diet. Drinking too much alcohol. Other risk factors include: Having a personal history of heart disease, diabetes, high cholesterol, or kidney disease. Stress. Having a family history of high blood pressure and high cholesterol. Having obstructive sleep apnea. Age. The risk increases with age. What are the signs or symptoms? High blood pressure may not cause symptoms. Very high blood pressure (hypertensive crisis) may cause: Headache. Fast or irregular heartbeats (palpitations). Shortness of breath. Nosebleed. Nausea and vomiting. Vision changes. Severe chest pain, dizziness, and seizures. How is this diagnosed? This condition is diagnosed by  measuring your blood pressure while you are seated, with your arm resting on a flat surface, your legs uncrossed, and your feet flat on the floor. The cuff of the blood pressure monitor will be placed directly against the skin of your upper arm at the level of your heart. Blood pressure should be measured at least twice using the same arm. Certain conditions can cause a difference in blood pressure between your right and left arms. If you have a high blood pressure reading during one visit or you have normal blood pressure with other risk factors, you may be asked to: Return on a different day to have your blood pressure checked again. Monitor your blood pressure at home for 1 week or longer. If you are diagnosed with hypertension, you may have other blood or imaging tests to help your health care provider understand your overall risk for other conditions. How is this treated? This condition is treated by making healthy lifestyle changes, such as eating healthy foods, exercising more, and reducing your alcohol intake. You may be referred for counseling on a healthy diet and physical activity. Your health care provider may prescribe medicine if lifestyle changes are not enough to get your blood pressure under control and if: Your systolic blood pressure is above 130. Your diastolic blood pressure is above 80. Your personal target blood pressure may vary depending on your medical conditions, your age, and other factors. Follow these instructions at home: Eating and drinking  Eat a diet that is high in fiber and potassium, and low in sodium, added sugar, and fat. An example of this eating plan is called the DASH diet. DASH stands for Dietary Approaches to Stop Hypertension. To eat this way: Eat   plenty of fresh fruits and vegetables. Try to fill one half of your plate at each meal with fruits and vegetables. Eat whole grains, such as whole-wheat pasta, brown rice, or whole-grain bread. Fill about one  fourth of your plate with whole grains. Eat or drink low-fat dairy products, such as skim milk or low-fat yogurt. Avoid fatty cuts of meat, processed or cured meats, and poultry with skin. Fill about one fourth of your plate with lean proteins, such as fish, chicken without skin, beans, eggs, or tofu. Avoid pre-made and processed foods. These tend to be higher in sodium, added sugar, and fat. Reduce your daily sodium intake. Many people with hypertension should eat less than 1,500 mg of sodium a day. Do not drink alcohol if: Your health care provider tells you not to drink. You are pregnant, may be pregnant, or are planning to become pregnant. If you drink alcohol: Limit how much you have to: 0-1 drink a day for women. 0-2 drinks a day for men. Know how much alcohol is in your drink. In the [place] one drink equals one 12 oz bottle of beer (355 mL), one 5 oz glass of wine (148 mL), or one 1 oz glass of hard liquor (44 mL). Lifestyle  Work with your health care provider to maintain a healthy body weight or to lose weight. Ask what an ideal weight is for you. Get at least 30 minutes of exercise that causes your heart to beat faster (aerobic exercise) most days of the week. Activities may include walking, swimming, or biking. Include exercise to strengthen your muscles (resistance exercise), such as Pilates or lifting weights, as part of your weekly exercise routine. Try to do these types of exercises for 30 minutes at least 3 days a week. Do not use any products that contain nicotine or tobacco. These products include cigarettes, chewing tobacco, and vaping devices, such as e-cigarettes. If you need help quitting, ask your health care provider. Monitor your blood pressure at home as told by your health care provider. Keep all follow-up visits. This is important. Medicines Take over-the-counter and prescription medicines only as told by your health care provider. Follow directions carefully. Blood  pressure medicines must be taken as prescribed. Do not skip doses of blood pressure medicine. Doing this puts you at risk for problems and can make the medicine less effective. Ask your health care provider about side effects or reactions to medicines that you should watch for. Contact a health care provider if you: Think you are having a reaction to a medicine you are taking. Have headaches that keep coming back (recurring). Feel dizzy. Have swelling in your ankles. Have trouble with your vision. Get help right away if you: Develop a severe headache or confusion. Have unusual weakness or numbness. Feel faint. Have severe pain in your chest or abdomen. Vomit repeatedly. Have trouble breathing. These symptoms may be an emergency. Get help right away. Call 911. Do not wait to see if the symptoms will go away. Do not drive yourself to the hospital. Summary Hypertension is when the force of blood pumping through your arteries is too strong. If this condition is not controlled, it may put you at risk for serious complications. Your personal target blood pressure may vary depending on your medical conditions, your age, and other factors. For most people, a normal blood pressure is less than 120/80. Hypertension is treated with lifestyle changes, medicines, or a combination of both. Lifestyle changes include losing weight, eating a healthy,   low-sodium diet, exercising more, and limiting alcohol. This information is not intended to replace advice given to you by your health care provider. Make sure you discuss any questions you have with your health care provider. Document Revised: 02/01/2021 Document Reviewed: 02/01/2021 Elsevier Patient Education  2023 Elsevier Inc.  

## 2021-10-19 ENCOUNTER — Ambulatory Visit
Admission: RE | Admit: 2021-10-19 | Discharge: 2021-10-19 | Disposition: A | Discharge status: Home / Self Care | Payer: Medicare HMO | Source: Ambulatory Visit | Attending: Internal Medicine | Admitting: Internal Medicine

## 2021-10-19 DIAGNOSIS — N1831 Chronic kidney disease, stage 3a: Secondary | ICD-10-CM

## 2021-10-19 DIAGNOSIS — I10 Essential (primary) hypertension: Secondary | ICD-10-CM

## 2021-10-19 DIAGNOSIS — N189 Chronic kidney disease, unspecified: Secondary | ICD-10-CM | POA: Diagnosis not present

## 2021-10-20 DIAGNOSIS — N3289 Other specified disorders of bladder: Secondary | ICD-10-CM | POA: Insufficient documentation

## 2021-10-22 ENCOUNTER — Other Ambulatory Visit: Payer: Self-pay | Admitting: Internal Medicine

## 2021-10-22 DIAGNOSIS — F5101 Primary insomnia: Secondary | ICD-10-CM

## 2021-10-24 ENCOUNTER — Other Ambulatory Visit: Payer: Self-pay | Admitting: Internal Medicine

## 2021-10-24 DIAGNOSIS — F5101 Primary insomnia: Secondary | ICD-10-CM

## 2021-10-24 LAB — LEAD, BLOOD (ADULT >= 16 YRS): Lead: 11.7 ug/dL — ABNORMAL HIGH (ref <3.5)

## 2021-10-24 LAB — ALDOSTERONE + RENIN ACTIVITY W/ RATIO
ALDO / PRA Ratio: 0.2 Ratio — ABNORMAL LOW (ref 0.9–28.9)
Aldosterone: 2 ng/dL
Renin Activity: 8.43 ng/mL/h — ABNORMAL HIGH (ref 0.25–5.82)

## 2021-10-24 MED ORDER — ZOLPIDEM TARTRATE 10 MG PO TABS: 10.0000 mg | ORAL_TABLET | Freq: Every evening | ORAL | 0 refills | Status: DC | PRN

## 2021-11-07 NOTE — Progress Notes (Unsigned)
[place] Phone: [phone] Subjective:   Brandon [name], am serving as a scribe for Dr. Hulan Saas.  I'm seeing this patient by the request  of:  [name], MD  CC: Low back pain, left hip pain  IRS:[id number]  08/26/2021 Patient is responding extremely better to the history of injection.  Encourage patient to continue to be active.  This is the first time he has been able to start building the deck around his pool in over a year.  Hopefully patient will continue to do well.  We will discuss continuing the home exercises.  [name] medication changes.  Follow-up as needed.  Patient can write if we do need to repeat the injections.  11/08/2021 Brandon [name] is a 71 y.o. male coming in with complaint of lumbar, DDD. Patient states that his L leg is painful over quad. Took prednisone and last day was Friday and pain started to come back this week.  Patient continues to have pain.  States that the last epidural only gave him 10 to 20% improvement and likely does not seem to worsen again.  Continues to have instability noted of the left hip.  Patient was to see another orthopedic surgeon.  Stated that they felt like it was more secondary to the lumbar spine and not the hip.    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   chemo/xrt comp 06/2009   Thyroid disease    Past Surgical History:  Procedure Laterality Date   HERNIA REPAIR     inguinal   hip replacemnt left Left    Left eye     stmach surgery     repair peg tube site   Social History   Socioeconomic History   Marital status: Married    Spouse name: Not on file   Number of children: 2   Years of education: 15   Highest  education level: Not on file  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 History   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  Tobacco Use   Smoking status: Former    Packs/day: 1.50    Years: 35.00    Total pack years: 52.50    Types: Cigarettes, Cigars    Start date: 05/16/1973    Quit date: 04/19/2009    Years since quitting: 12.5   Smokeless tobacco: Never   Tobacco comments:    quit smoking 5 years sago  Substance and Sexual Activity   Alcohol use: Yes    Alcohol/week: 30.0 standard drinks of alcohol    Types: 30 Shots of liquor per week   Drug use: [name]   Sexual activity: Yes    Partners: Female  Other Topics Concern   Not on file  Social History Narrative   HS[employer], [name] degree. married - '76 - 5 years, divorced; remarried  '84. 1 son - '85; 1 daughter '89. 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    Social Determinants of Health   Financial Resource Strain: Not on file  Food Insecurity: Not on file  Transportation Needs: Not on file  Physical Activity: Not on file  Stress: Not on  file  Social Connections: Not on file   Allergies  Allergen Reactions   Benicar [Olmesartan]     fatigue   Amlodipine Swelling   Family History  Problem Relation Age of Onset   Cancer Mother        lymphoma   Colon cancer Mother    Cancer Father        lung (smoker)   Hyperlipidemia Father    Hypertension Father    Diabetes Father    Esophageal cancer Neg Hx    Rectal cancer Neg Hx    Stomach cancer Neg Hx     Current Outpatient Medications (Endocrine & Metabolic):    levothyroxine (SYNTHROID) 100 MCG tablet, TAKE 1 TABLET BY MOUTH EVERY DAY  Current Outpatient Medications (Cardiovascular):    candesartan (ATACAND) 32 MG tablet, TAKE 1 TABLET BY MOUTH DAILY.   carvedilol (COREG) 6.25 MG tablet, TAKE 1 TABLET BY MOUTH 2 TIMES DAILY WITH A MEAL.   rosuvastatin (CRESTOR) 20 MG tablet, TAKE 1 TABLET BY MOUTH EVERY DAY   Current Outpatient Medications (Analgesics):    aspirin  81 MG tablet, Take 81 mg by mouth daily.   Current Outpatient Medications (Other):    gabapentin (NEURONTIN) 300 MG capsule, TAKE 1 CAPSULE BY MOUTH EVERYDAY AT BEDTIME   Magnesium Oxide 400 MG CAPS, Take 1 capsule (400 mg total) by mouth in the morning and at bedtime.   omeprazole (PRILOSEC) 40 MG capsule, TAKE 1 CAPSULE BY MOUTH EVERY DAY   thiamine 100 MG tablet, TAKE 1/2 TABLET DAILY   zolpidem (AMBIEN) 10 MG tablet, TAKE 1 TABLET BY MOUTH EVERY DAY AT BEDTIME AS NEEDED   Reviewed prior external information including notes and imaging from  primary care provider As well as notes that were available from care everywhere and other healthcare systems.  Past medical history, social, surgical and family history all reviewed in electronic medical record.  [name] pertanent information unless stated regarding to the chief complaint.   Review of Systems:  [name] headache, visual changes, nausea, vomiting, diarrhea, constipation, dizziness, abdominal pain, skin rash, fevers, chills, night sweats, weight loss, swollen lymph nodes,  joint swelling, chest pain, shortness of breath, mood changes. POSITIVE muscle aches, body aches  Objective  Blood pressure 100/72, pulse 69, height '5\' 10"'$  (1.778 m), weight 198 lb (89.8 kg), SpO2 98 %.   General: [name] apparent distress alert and oriented x3 mood and affect normal, dressed appropriately.  HEENT: Pupils equal, extraocular movements intact  Respiratory: Patient's speak in full sentences and does not appear short of breath  Cardiovascular: [name] lower extremity edema, non tender, [name] erythema  Antalgic gait noted.  Severe tenderness still noted on the hip itself.  Patient's back does have loss of lordosis.  Tenderness to palpation is out of proportion to the amount of palpation.    Impression and Recommendations:     The above documentation has been reviewed and is accurate and complete Lyndal Pulley, DO

## 2021-11-08 ENCOUNTER — Ambulatory Visit: Payer: Medicare HMO | Admitting: Family Medicine

## 2021-11-08 ENCOUNTER — Encounter: Payer: Self-pay | Admitting: Family Medicine

## 2021-11-08 VITALS — BP 100/72 | HR 69 | Ht 70.0 in | Wt 198.0 lb

## 2021-11-08 DIAGNOSIS — M545 Low back pain, unspecified: Secondary | ICD-10-CM | POA: Diagnosis not present

## 2021-11-08 DIAGNOSIS — N3289 Other specified disorders of bladder: Secondary | ICD-10-CM

## 2021-11-08 DIAGNOSIS — M25551 Pain in right hip: Secondary | ICD-10-CM | POA: Diagnosis not present

## 2021-11-08 DIAGNOSIS — M25552 Pain in left hip: Secondary | ICD-10-CM | POA: Diagnosis not present

## 2021-11-08 DIAGNOSIS — M5136 Other intervertebral disc degeneration, lumbar region: Secondary | ICD-10-CM

## 2021-11-08 MED ORDER — METHYLPREDNISOLONE ACETATE 40 MG/ML IJ SUSP
40.0000 mg | Freq: Once | INTRAMUSCULAR | Status: AC
  Administered 2021-11-08: 40 mg via INTRAMUSCULAR

## 2021-11-08 MED ORDER — KETOROLAC TROMETHAMINE 30 MG/ML IJ SOLN
30.0000 mg | Freq: Once | INTRAMUSCULAR | Status: AC
  Administered 2021-11-08: 30 mg via INTRAMUSCULAR

## 2021-11-08 NOTE — Assessment & Plan Note (Signed)
Patient continues to have the left hip and back pain.  Not responding to the epidural signs frequently at this time.  The patient is having instability somewhat of the left hip as well.  Patient was told by orthopedics that if they do not feel that it is his hip.  Patient continues to have this discomfort and pain and does recently have the bladder mass noted.  I feel at this point we should do an MRI of the pelvis with and without contrast to see if we can further evaluate this and if this could be potentially contributing to any pain with any type of impingement.  Depending on findings we will discuss medical management.  Encourage patient to follow-up with neurology which patient has not been contacted yet.

## 2021-11-08 NOTE — Patient Instructions (Signed)
MRI pelvis w/wo [phone] We will be in touch

## 2021-11-18 ENCOUNTER — Ambulatory Visit
Admission: RE | Admit: 2021-11-18 | Discharge: 2021-11-18 | Disposition: A | Discharge status: Home / Self Care | Payer: Medicare HMO | Source: Ambulatory Visit | Attending: Family Medicine | Admitting: Family Medicine

## 2021-11-18 ENCOUNTER — Other Ambulatory Visit: Payer: Self-pay | Admitting: Internal Medicine

## 2021-11-18 DIAGNOSIS — M545 Low back pain, unspecified: Secondary | ICD-10-CM

## 2021-11-18 DIAGNOSIS — M25551 Pain in right hip: Secondary | ICD-10-CM

## 2021-11-18 DIAGNOSIS — E039 Hypothyroidism, unspecified: Secondary | ICD-10-CM

## 2021-11-18 DIAGNOSIS — M160 Bilateral primary osteoarthritis of hip: Secondary | ICD-10-CM | POA: Diagnosis not present

## 2021-11-18 MED ORDER — GADOBENATE DIMEGLUMINE 529 MG/ML IV SOLN
17.0000 mL | Freq: Once | INTRAVENOUS | Status: AC | PRN
  Administered 2021-11-18: 17 mL via INTRAVENOUS

## 2021-11-21 ENCOUNTER — Encounter: Payer: Self-pay | Admitting: Family Medicine

## 2021-11-22 ENCOUNTER — Other Ambulatory Visit: Payer: Self-pay

## 2021-11-22 DIAGNOSIS — M87052 Idiopathic aseptic necrosis of left femur: Secondary | ICD-10-CM

## 2021-11-25 ENCOUNTER — Encounter: Payer: Self-pay | Admitting: Internal Medicine

## 2021-12-07 ENCOUNTER — Ambulatory Visit: Payer: Medicare HMO | Admitting: Orthopaedic Surgery

## 2021-12-07 DIAGNOSIS — M48061 Spinal stenosis, lumbar region without neurogenic claudication: Secondary | ICD-10-CM | POA: Diagnosis not present

## 2021-12-07 DIAGNOSIS — Z96642 Presence of left artificial hip joint: Secondary | ICD-10-CM | POA: Diagnosis not present

## 2021-12-07 NOTE — Progress Notes (Signed)
The patient is a 71 year old gentleman I am seeing for the first time.  Over 10 years ago he had a left total hip replacement by one of my colleagues in town.  This was done through a posterior approach.  The patient reports that he is always had left hip pain since that surgery and before surgery.  It has been worked up quite significantly.  He did have x-rays of his pelvis and left hip toward the end of last year.  He recently had an MRI of his pelvis.  I was able to review those x-rays and MRI and showed those to the patient as well.  He also deals with significant foraminal stenosis to the left side at L3-L4 and L5-S1.  He has had epidurals in the past that have helped alleviate some of the pain.  He also has well-documented avascular porosis in his right hip femoral head but he is asymptomatic.  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  His left hip moves smoothly and fluidly with no blocks to rotation.  His leg lengths are near equal.  His left and right knees have just slight flexion contractures but no effusion.  His right hip exam is entirely normal as well.  There is no pain on exam of his left hip.  His incision is healed nicely.  I did go over the x-rays and plain film as well as MRI of the patient's pelvis and left hip.  There is no complicating features of the hardware.  There is no evidence of loosening or infection or fluid collections.  Based on my exam and also looking at the MRI of his lumbar spine, I do feel that a lot of his left lower extremity issues are radicular in nature.  I would like to send him as an opinion to Dr. Kary Kos or Dr. Sherley Bounds to get an idea of what they feel the patient may be dealing with in terms of the potential for his left lower extremity symptoms coming from the foraminal stenosis that he has in his lumbar spine the left side.  The patient does agree with  this referral.  All question concerns were answered and addressed.  I gave him reassurance that his left knee replacement looks good.  If he does experience other significant orthopedic issues I am happy to see him at any time and he said he would come see me.

## 2021-12-08 NOTE — Addendum Note (Signed)
Addended byLaurann Montana on: 12/08/2021 03:51 PM   Modules accepted: Orders

## 2021-12-16 DIAGNOSIS — Z961 Presence of intraocular lens: Secondary | ICD-10-CM | POA: Diagnosis not present

## 2021-12-16 DIAGNOSIS — H40053 Ocular hypertension, bilateral: Secondary | ICD-10-CM | POA: Diagnosis not present

## 2021-12-16 DIAGNOSIS — H25811 Combined forms of age-related cataract, right eye: Secondary | ICD-10-CM | POA: Diagnosis not present

## 2021-12-16 DIAGNOSIS — H26492 Other secondary cataract, left eye: Secondary | ICD-10-CM | POA: Diagnosis not present

## 2021-12-16 DIAGNOSIS — H43811 Vitreous degeneration, right eye: Secondary | ICD-10-CM | POA: Diagnosis not present

## 2021-12-19 DIAGNOSIS — D414 Neoplasm of uncertain behavior of bladder: Secondary | ICD-10-CM | POA: Diagnosis not present

## 2021-12-22 DIAGNOSIS — H2511 Age-related nuclear cataract, right eye: Secondary | ICD-10-CM | POA: Diagnosis not present

## 2022-01-02 ENCOUNTER — Other Ambulatory Visit: Payer: Self-pay | Admitting: Internal Medicine

## 2022-01-02 DIAGNOSIS — I10 Essential (primary) hypertension: Secondary | ICD-10-CM

## 2022-01-02 DIAGNOSIS — E785 Hyperlipidemia, unspecified: Secondary | ICD-10-CM

## 2022-01-02 DIAGNOSIS — I712 Thoracic aortic aneurysm, without rupture, unspecified: Secondary | ICD-10-CM

## 2022-01-12 DIAGNOSIS — R310 Gross hematuria: Secondary | ICD-10-CM | POA: Diagnosis not present

## 2022-01-12 DIAGNOSIS — D414 Neoplasm of uncertain behavior of bladder: Secondary | ICD-10-CM | POA: Diagnosis not present

## 2022-01-13 ENCOUNTER — Other Ambulatory Visit: Payer: Self-pay | Admitting: Urology

## 2022-01-16 MED ORDER — GEMCITABINE CHEMO FOR BLADDER INSTILLATION 2000 MG: 2000.0000 mg | Freq: Once | INTRAVENOUS | Status: DC

## 2022-01-17 DIAGNOSIS — Z6826 Body mass index (BMI) 26.0-26.9, adult: Secondary | ICD-10-CM | POA: Diagnosis not present

## 2022-01-17 DIAGNOSIS — M5416 Radiculopathy, lumbar region: Secondary | ICD-10-CM | POA: Diagnosis not present

## 2022-01-19 ENCOUNTER — Other Ambulatory Visit: Payer: Self-pay | Admitting: Internal Medicine

## 2022-01-19 DIAGNOSIS — F5101 Primary insomnia: Secondary | ICD-10-CM

## 2022-01-20 ENCOUNTER — Other Ambulatory Visit: Payer: Self-pay | Admitting: Neurological Surgery

## 2022-01-20 DIAGNOSIS — M5416 Radiculopathy, lumbar region: Secondary | ICD-10-CM

## 2022-01-23 DIAGNOSIS — R310 Gross hematuria: Secondary | ICD-10-CM | POA: Diagnosis not present

## 2022-01-23 DIAGNOSIS — N281 Cyst of kidney, acquired: Secondary | ICD-10-CM | POA: Diagnosis not present

## 2022-01-23 DIAGNOSIS — K5730 Diverticulosis of large intestine without perforation or abscess without bleeding: Secondary | ICD-10-CM | POA: Diagnosis not present

## 2022-01-23 DIAGNOSIS — C679 Malignant neoplasm of bladder, unspecified: Secondary | ICD-10-CM | POA: Diagnosis not present

## 2022-02-06 ENCOUNTER — Other Ambulatory Visit: Payer: Self-pay

## 2022-02-06 ENCOUNTER — Ambulatory Visit
Admission: RE | Admit: 2022-02-06 | Discharge: 2022-02-06 | Disposition: A | Discharge status: Home / Self Care | Payer: Medicare HMO | Source: Ambulatory Visit | Attending: Neurological Surgery | Admitting: Neurological Surgery

## 2022-02-06 ENCOUNTER — Encounter (HOSPITAL_BASED_OUTPATIENT_CLINIC_OR_DEPARTMENT_OTHER): Payer: Self-pay | Admitting: Urology

## 2022-02-06 DIAGNOSIS — M5416 Radiculopathy, lumbar region: Secondary | ICD-10-CM

## 2022-02-06 DIAGNOSIS — M545 Low back pain, unspecified: Secondary | ICD-10-CM | POA: Diagnosis not present

## 2022-02-06 NOTE — Progress Notes (Signed)
Spoke w/ via phone for pre-op interview---pt  Lab needs dos----I stat               Lab results------EKG 04/13/2021 COVID test -----patient states asymptomatic no test needed Arrive at -------1000 NPO after MN NO Solid Food.  Clear liquids from MN until---0900 Med rec completed Medications to take morning of surgery ----carvedilol and synthroid and prilosec- Diabetic medication -----n/a Patient instructed no nail polish to be worn day of surgery Patient instructed to bring photo id and insurance card day of surgery Patient aware to have Driver (ride ) / caregiver   wife Saahil Herbster for 24 hours after surgery  Patient Special Instructions -----n/a Pre-Op special Istructions -----N/a Patient verbalized understanding of instructions that were given at this phone interview. Patient denies shortness of breath, chest pain, fever, cough at this phone interview.   Presented to Dr. Margurite Auerbach [facility] of day about pt stating having difficult with intubation due to radiation of the neck  reviewed info in chart from back in 2012, difficult to read per Dr.  Abbott Pao  clear to come to surgery center for surgery per anesthesia.

## 2022-02-09 NOTE — Anesthesia Preprocedure Evaluation (Addendum)
Anesthesia Evaluation  Patient identified by MRN, date of birth, ID band Patient awake    Reviewed: Allergy & Precautions, NPO status , Patient's Chart, lab work & pertinent test results  Airway Mallampati: III  TM Distance: >3 FB Neck ROM: Limited   Comment: S/P radiation to Neck w limited mobilty Dental no notable dental hx. (+) Teeth Intact, Dental Advisory Given   Pulmonary former smoker   Pulmonary exam normal breath sounds clear to auscultation       Cardiovascular hypertension, Normal cardiovascular exam Rhythm:Regular Rate:Normal     Neuro/Psych negative neurological ROS  negative psych ROS   GI/Hepatic Neg liver ROS,GERD  ,,  Endo/Other  Hypothyroidism    Renal/GU Lab Results      Component                Value               Date                      CREATININE               1.33                10/12/2021                BUN                      55 (H)              10/12/2021                NA                       142                 10/12/2021                K                        4.3                 10/12/2021                CL                       107                 10/12/2021                CO2                      24                  10/12/2021              Bladder tumor    Musculoskeletal  (+) Arthritis ,    Abdominal   Peds  Hematology   Anesthesia Other Findings   Reproductive/Obstetrics                              Anesthesia Physical Anesthesia Plan  ASA: 3  Anesthesia Plan: General   Post-op Pain Management:    Induction: Intravenous  PONV Risk Score and Plan: 3 and Treatment may vary due to age or medical condition, Midazolam and  Ondansetron  Airway Management Planned: LMA  Additional Equipment: None  Intra-op Plan:   Post-operative Plan:   Informed Consent: I have reviewed the patients History and Physical, chart, labs and discussed the  procedure including the risks, benefits and alternatives for the proposed anesthesia with the patient or authorized representative who has indicated his/her understanding and acceptance.     Dental advisory given  Plan Discussed with:   Anesthesia Plan Comments:         Anesthesia Quick Evaluation

## 2022-02-09 NOTE — H&P (Signed)
Office Visit Report     01/12/2022   --------------------------------------------------------------------------------   Brandon [name]  MRN: [id number]  DOB: 1950/08/31, 71 year old Male  SSN:    PRIMARY CARE:  [name], MD  REFERRING:  Georgette Dover, MD  PROVIDER:  Festus Aloe, M.D.  LOCATION:  [facility] [phone]     --------------------------------------------------------------------------------   CC/HPI: F/u -    1) bladder neoplasm-patient underwent a renal ultrasound July 2023 for chronic kidney disease with a creatinine of 1.33 and GFR 55. Renal ultrasound showed no hydronephrosis or renal mass or stone, but did show small area of focal nodularity of the bladder wall. This could be a small papillary tumor versus the bladder wall being visualized and then under distended bladder. He did undergo MRI of the pelvis for hip pain August 2023 where the bladder looked normal.   No gross hematuria. He smoked for about 11 years. He had throat cancer. He had chemo and XRT.   Today, seen for the above - cysto showed a small left posterior   April 2023 PSA was 1.1.   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     ALLERGIES: None   MEDICATIONS: Levothyroxine Sodium  Omeprazole  Candesartan-Hydrochlorothiazid 32 mg-25 mg tablet  Carvedilol 6.25 mg tablet  Gabapentin 300 mg capsule  Magnesium 400 mg magnesium tablet  Rosuvastatin Calcium 20 mg tablet  Zolpidem Tartrate 10 mg tablet     GU PSH: None   NON-GU PSH: Cataract surgery, Right - about 12/22/2021 Hip Replacement, Left Stomach surgery (unspecified)     GU PMH: Bladder tumor/neoplasm, disc nature r/b/a to cystoscpopy and he will schedule. - 12/19/2021      PMH Notes: Neck Cancer,    NON-GU PMH: Aortic aneurysm of unspecified site, without rupture Arthritis GERD Hypertension Hypothyroidism    FAMILY HISTORY: No Family History    SOCIAL HISTORY: Marital Status:  Married Preferred Language: English; Ethnicity: Not Hispanic Or Latino; Race: [place] Current Smoking Status: Patient does not smoke anymore. Has not smoked since 12/10/2010. Smoked for 20 years.   Tobacco Use Assessment Completed: Used Tobacco in last 30 days? Does not use smokeless tobacco. Does drink.  Drinks 1 caffeinated drink per day.    REVIEW OF SYSTEMS:    GU Review Male:   Patient reports get up at night to urinate. Patient denies frequent urination, hard to postpone urination, burning/ pain with urination, leakage of urine, stream starts and stops, trouble starting your stream, have to strain to urinate , erection problems, and penile pain.  Gastrointestinal (Upper):   Patient denies nausea, vomiting, and indigestion/ heartburn.  Gastrointestinal (Lower):   Patient denies diarrhea and constipation.  Constitutional:   Patient denies fever, night sweats, weight loss, and fatigue.  Skin:   Patient denies skin rash/ lesion and itching.  Eyes:   Patient denies blurred vision and double vision.  Ears/ Nose/ Throat:   Patient denies sore throat and sinus problems.  Hematologic/Lymphatic:   Patient denies swollen glands and easy bruising.  Cardiovascular:   Patient denies leg swelling and chest pains.  Respiratory:   Patient denies cough and shortness of breath.  Endocrine:   Patient denies excessive thirst.  Musculoskeletal:   Patient reports back pain and joint pain.   Neurological:   Patient denies headaches and dizziness.  Psychologic:   Patient denies anxiety and depression.   VITAL SIGNS: None   GU PHYSICAL EXAMINATION:    Scrotum: No lesions. No  edema. No cysts. No warts.  Urethral Meatus: Normal size. No lesion, no wart, no discharge, no polyp. Normal location.  Penis: Circumcised, no warts, no cracks. No dorsal Peyronie's plaques, no left corporal Peyronie's plaques, no right corporal Peyronie's plaques, no scarring, no warts. No balanitis, no meatal stenosis.   MULTI-SYSTEM  PHYSICAL EXAMINATION:       PAST DATA REVIEW: None   PROCEDURES:         Flexible Cystoscopy - 52000  Risks, benefits, and some of the potential complications of the procedure were discussed at length with the patient including infection, bleeding, voiding discomfort, urinary retention, fever, chills, sepsis, and others. All questions were answered. Informed consent was obtained. Antibiotic prophylaxis was given. Sterile technique and intraurethral analgesia were used.  Meatus:  Normal size. Normal location. Normal condition.  Urethra:  No strictures.  External Sphincter:  Normal.  Verumontanum:  Normal.  Prostate:  Non-obstructing. No hyperplasia.  Bladder Neck:  Non-obstructing.  Bladder:  A right posterior wall tumor with adjacent papillary growth closer to the trigone and BN. I could not quite see either UO. I scraped the tumor and it bleed a small amount as I tried to angle down to see the trigone. No trabeculation. Normal mucosa. No stones.      The lower urinary tract was carefully examined. The procedure was well-tolerated and without complications. Antibiotic instructions were given. Instructions were given to call the office immediately for bloody urine, difficulty urinating, urinary retention, painful or frequent urination, fever, chills, nausea, vomiting or other illness. The patient stated that he understood these instructions and would comply with them.         Urinalysis Dipstick Dipstick Cont'd  Color: Yellow Bilirubin: Neg mg/dL  Appearance: Clear Ketones: Neg mg/dL  Specific Gravity: 1.025 Blood: Neg ery/uL  pH: 6.0 Protein: Neg mg/dL  Glucose: Neg mg/dL Urobilinogen: 0.2 mg/dL    Nitrites: Neg    Leukocyte Esterase: Neg leu/uL    ASSESSMENT:      ICD-10 Details  1 GU:   Bladder tumor/neoplasm - D41.4 Chronic, Stable - We viewed the tumor on the cystoscopy screen. Discussed with patient and wife the nature r/b/a to TURBT with post-op instillation of chemo. Also disc  poss need for stent, foley, staged procedure.   I'm going to get a more thorough staging study with CT A/P.   2   Renal cyst - N28.1 Chronic, Stable  3   Gross hematuria - R31.0 Chronic, Stable - He voided with red urine after the cystoscopy. This should clear but given return precautions.    PLAN:           Orders Labs BUN/Creatinine  X-Rays: C.T. Abdomen/Pelvis With and Without I.V. Contrast  X-Ray Notes: History: Bladder tumor/neoplasm /Renal cyst /Gross hematuria   Hematuria: Yes  Patient to see MD after exam: No  Previous exam: CT   When: 04/2021  Where: [place]   Diabetic: No  BUN/ Creatinine: Pending   Date of last BUN Creatinine: 01/12/22  Weight in pounds: 198  Allergy- IV Contrast: No  Conflicting diabetic meds: No  Diabetic Meds:  Prior Authorization #: Pending ([employer])            Schedule Return Visit/Planned Activity: Next Available Appointment - Schedule Surgery          Document Letter(s):  Created for Patient: Clinical Summary         Notes:   cc: Dr. Dema Severin  Next Appointment:      Next Appointment: 01/23/2022 01:45 PM    Appointment Type: CT Scan    Location: [facility] [phone]    Provider: CT CT    Reason for Visit: CT Abdomen/Pelvis with and without/Bladder tumor/neoplasm/[place]      * Signed by Festus Aloe, M.D. on 01/15/22 at 8:47 PM (EDT)*

## 2022-02-10 ENCOUNTER — Ambulatory Visit (HOSPITAL_BASED_OUTPATIENT_CLINIC_OR_DEPARTMENT_OTHER): Payer: Medicare HMO | Admitting: Anesthesiology

## 2022-02-10 ENCOUNTER — Ambulatory Visit (HOSPITAL_BASED_OUTPATIENT_CLINIC_OR_DEPARTMENT_OTHER)
Admission: RE | Admit: 2022-02-10 | Discharge: 2022-02-10 | Disposition: A | Discharge status: Home / Self Care | Payer: Medicare HMO | Source: Ambulatory Visit | Attending: Urology | Admitting: Urology

## 2022-02-10 ENCOUNTER — Encounter (HOSPITAL_BASED_OUTPATIENT_CLINIC_OR_DEPARTMENT_OTHER): Payer: Self-pay | Admitting: Urology

## 2022-02-10 ENCOUNTER — Encounter (HOSPITAL_BASED_OUTPATIENT_CLINIC_OR_DEPARTMENT_OTHER)
Admission: RE | Disposition: A | Discharge status: Home / Self Care | Payer: Self-pay | Source: Ambulatory Visit | Attending: Urology

## 2022-02-10 DIAGNOSIS — N3289 Other specified disorders of bladder: Secondary | ICD-10-CM

## 2022-02-10 DIAGNOSIS — E039 Hypothyroidism, unspecified: Secondary | ICD-10-CM | POA: Insufficient documentation

## 2022-02-10 DIAGNOSIS — D303 Benign neoplasm of bladder: Secondary | ICD-10-CM

## 2022-02-10 DIAGNOSIS — R310 Gross hematuria: Secondary | ICD-10-CM | POA: Diagnosis not present

## 2022-02-10 DIAGNOSIS — N281 Cyst of kidney, acquired: Secondary | ICD-10-CM | POA: Diagnosis not present

## 2022-02-10 DIAGNOSIS — Z85819 Personal history of malignant neoplasm of unspecified site of lip, oral cavity, and pharynx: Secondary | ICD-10-CM | POA: Insufficient documentation

## 2022-02-10 DIAGNOSIS — Z87891 Personal history of nicotine dependence: Secondary | ICD-10-CM | POA: Diagnosis not present

## 2022-02-10 DIAGNOSIS — K219 Gastro-esophageal reflux disease without esophagitis: Secondary | ICD-10-CM | POA: Insufficient documentation

## 2022-02-10 DIAGNOSIS — Z9221 Personal history of antineoplastic chemotherapy: Secondary | ICD-10-CM | POA: Insufficient documentation

## 2022-02-10 DIAGNOSIS — I10 Essential (primary) hypertension: Secondary | ICD-10-CM | POA: Diagnosis not present

## 2022-02-10 DIAGNOSIS — I129 Hypertensive chronic kidney disease with stage 1 through stage 4 chronic kidney disease, or unspecified chronic kidney disease: Secondary | ICD-10-CM | POA: Insufficient documentation

## 2022-02-10 DIAGNOSIS — Z923 Personal history of irradiation: Secondary | ICD-10-CM | POA: Diagnosis not present

## 2022-02-10 DIAGNOSIS — D414 Neoplasm of uncertain behavior of bladder: Secondary | ICD-10-CM | POA: Diagnosis not present

## 2022-02-10 DIAGNOSIS — N189 Chronic kidney disease, unspecified: Secondary | ICD-10-CM | POA: Insufficient documentation

## 2022-02-10 DIAGNOSIS — C679 Malignant neoplasm of bladder, unspecified: Secondary | ICD-10-CM | POA: Diagnosis not present

## 2022-02-10 DIAGNOSIS — D090 Carcinoma in situ of bladder: Secondary | ICD-10-CM | POA: Diagnosis not present

## 2022-02-10 HISTORY — DX: Presence of spectacles and contact lenses: Z97.3

## 2022-02-10 HISTORY — DX: Hypothyroidism, unspecified: E03.9

## 2022-02-10 HISTORY — PX: TRANSURETHRAL RESECTION OF BLADDER TUMOR WITH MITOMYCIN-C: SHX6459

## 2022-02-10 HISTORY — DX: Dysphagia, unspecified: R13.10

## 2022-02-10 LAB — POCT I-STAT, CHEM 8
BUN: 13 mg/dL (ref 8–23)
Calcium, Ion: 1.24 mmol/L (ref 1.15–1.40)
Chloride: 103 mmol/L (ref 98–111)
Creatinine, Ser: 1 mg/dL (ref 0.61–1.24)
Glucose, Bld: 108 mg/dL — ABNORMAL HIGH (ref 70–99)
HCT: 43 % (ref 39.0–52.0)
Hemoglobin: 14.6 g/dL (ref 13.0–17.0)
Potassium: 4.2 mmol/L (ref 3.5–5.1)
Sodium: 141 mmol/L (ref 135–145)
TCO2: 27 mmol/L (ref 22–32)

## 2022-02-10 SURGERY — TRANSURETHRAL RESECTION OF BLADDER TUMOR WITH MITOMYCIN-C
Anesthesia: General | Site: Bladder | Laterality: Bilateral

## 2022-02-10 MED ORDER — GEMCITABINE CHEMO FOR BLADDER INSTILLATION 2000 MG
2000.0000 mg | Freq: Once | INTRAVENOUS | Status: AC
  Administered 2022-02-10: 2000 mg via INTRAVESICAL
  Filled 2022-02-10: qty 2000

## 2022-02-10 MED ORDER — 0.9 % SODIUM CHLORIDE (POUR BTL) OPTIME
TOPICAL | Status: DC | PRN
  Administered 2022-02-10: 500 mL

## 2022-02-10 MED ORDER — PROPOFOL 10 MG/ML IV BOLUS
INTRAVENOUS | Status: DC | PRN
  Administered 2022-02-10: 140 mg via INTRAVENOUS

## 2022-02-10 MED ORDER — FENTANYL CITRATE (PF) 250 MCG/5ML IJ SOLN
INTRAMUSCULAR | Status: DC | PRN
  Administered 2022-02-10: 25 ug via INTRAVENOUS
  Administered 2022-02-10: 50 ug via INTRAVENOUS

## 2022-02-10 MED ORDER — LACTATED RINGERS IV SOLN: INTRAVENOUS | Status: DC

## 2022-02-10 MED ORDER — MIDAZOLAM HCL 2 MG/2ML IJ SOLN
INTRAMUSCULAR | Status: DC | PRN
  Administered 2022-02-10: 1 mg via INTRAVENOUS

## 2022-02-10 MED ORDER — EPHEDRINE SULFATE (PRESSORS) 50 MG/ML IJ SOLN
INTRAMUSCULAR | Status: DC | PRN
  Administered 2022-02-10: 5 mg via INTRAVENOUS

## 2022-02-10 MED ORDER — LIDOCAINE 2% (20 MG/ML) 5 ML SYRINGE
INTRAMUSCULAR | Status: DC | PRN
  Administered 2022-02-10: 100 mg via INTRAVENOUS

## 2022-02-10 MED ORDER — ONDANSETRON HCL 4 MG/2ML IJ SOLN
INTRAMUSCULAR | Status: DC | PRN
  Administered 2022-02-10: 4 mg via INTRAVENOUS

## 2022-02-10 MED ORDER — SODIUM CHLORIDE 0.9 % IR SOLN
Status: DC | PRN
  Administered 2022-02-10: 3000 mL

## 2022-02-10 MED ORDER — OXYCODONE HCL 5 MG/5ML PO SOLN: 5.0000 mg | Freq: Once | ORAL | Status: DC | PRN

## 2022-02-10 MED ORDER — CEFAZOLIN SODIUM-DEXTROSE 2-4 GM/100ML-% IV SOLN
INTRAVENOUS | Status: AC
  Filled 2022-02-10: qty 100

## 2022-02-10 MED ORDER — AMISULPRIDE (ANTIEMETIC) 5 MG/2ML IV SOLN: 10.0000 mg | Freq: Once | INTRAVENOUS | Status: DC | PRN

## 2022-02-10 MED ORDER — MIDAZOLAM HCL 2 MG/2ML IJ SOLN
INTRAMUSCULAR | Status: AC
  Filled 2022-02-10: qty 2

## 2022-02-10 MED ORDER — FENTANYL CITRATE (PF) 100 MCG/2ML IJ SOLN
INTRAMUSCULAR | Status: AC
  Filled 2022-02-10: qty 2

## 2022-02-10 MED ORDER — HYDROMORPHONE HCL 1 MG/ML IJ SOLN: 0.2500 mg | INTRAMUSCULAR | Status: DC | PRN

## 2022-02-10 MED ORDER — ONDANSETRON HCL 4 MG/2ML IJ SOLN: 4.0000 mg | Freq: Once | INTRAMUSCULAR | Status: DC | PRN

## 2022-02-10 MED ORDER — ACETAMINOPHEN 10 MG/ML IV SOLN: 1000.0000 mg | Freq: Once | INTRAVENOUS | Status: DC | PRN

## 2022-02-10 MED ORDER — PROPOFOL 10 MG/ML IV BOLUS
INTRAVENOUS | Status: AC
  Filled 2022-02-10: qty 20

## 2022-02-10 MED ORDER — GLYCOPYRROLATE 0.2 MG/ML IJ SOLN
INTRAMUSCULAR | Status: DC | PRN
  Administered 2022-02-10: .1 mg via INTRAVENOUS

## 2022-02-10 MED ORDER — DEXAMETHASONE SODIUM PHOSPHATE 10 MG/ML IJ SOLN
INTRAMUSCULAR | Status: DC | PRN
  Administered 2022-02-10: 5 mg via INTRAVENOUS

## 2022-02-10 MED ORDER — OXYCODONE HCL 5 MG PO TABS: 5.0000 mg | ORAL_TABLET | Freq: Once | ORAL | Status: DC | PRN

## 2022-02-10 MED ORDER — CEFAZOLIN SODIUM-DEXTROSE 2-4 GM/100ML-% IV SOLN
2.0000 g | INTRAVENOUS | Status: AC
  Administered 2022-02-10: 2 g via INTRAVENOUS

## 2022-02-10 SURGICAL SUPPLY — 28 items
BAG DRAIN URO-CYSTO SKYTR STRL (DRAIN) ×1 IMPLANT
BAG DRN RND TRDRP ANRFLXCHMBR (UROLOGICAL SUPPLIES) ×1
BAG DRN UROCATH (DRAIN) ×1
BAG URINE DRAIN 2000ML AR STRL (UROLOGICAL SUPPLIES) IMPLANT
BAG URINE LEG 500ML (DRAIN) IMPLANT
CATH FOLEY 2WAY SLVR  5CC 16FR (CATHETERS) ×1
CATH FOLEY 2WAY SLVR  5CC 20FR (CATHETERS)
CATH FOLEY 2WAY SLVR  5CC 22FR (CATHETERS)
CATH FOLEY 2WAY SLVR 5CC 16FR (CATHETERS) IMPLANT
CATH FOLEY 2WAY SLVR 5CC 20FR (CATHETERS) IMPLANT
CATH FOLEY 2WAY SLVR 5CC 22FR (CATHETERS) IMPLANT
CATH URETL OPEN 5X70 (CATHETERS) IMPLANT
CLOTH BEACON ORANGE TIMEOUT ST (SAFETY) ×1 IMPLANT
DRSG TELFA 3X8 NADH STRL (GAUZE/BANDAGES/DRESSINGS) IMPLANT
ELECT REM PT RETURN 9FT ADLT (ELECTROSURGICAL) ×1
ELECTRODE REM PT RTRN 9FT ADLT (ELECTROSURGICAL) ×1 IMPLANT
EVACUATOR MICROVAS BLADDER (UROLOGICAL SUPPLIES) IMPLANT
GLOVE BIO SURGEON STRL SZ7.5 (GLOVE) ×1 IMPLANT
GLOVE BIO SURGEON STRL SZ8 (GLOVE) IMPLANT
GOWN STRL REUS W/TWL LRG LVL3 (GOWN DISPOSABLE) ×1 IMPLANT
HOLDER FOLEY CATH W/STRAP (MISCELLANEOUS) IMPLANT
KIT TURNOVER CYSTO (KITS) ×1 IMPLANT
LOOP CUT BIPOLAR 24F LRG (ELECTROSURGICAL) IMPLANT
MANIFOLD NEPTUNE II (INSTRUMENTS) ×1 IMPLANT
PACK CYSTO (CUSTOM PROCEDURE TRAY) ×1 IMPLANT
PLUG CATH AND CAP STER (CATHETERS) IMPLANT
TUBE CONNECTING 12X1/4 (SUCTIONS) ×1 IMPLANT
TUBING UROLOGY SET (TUBING) ×1 IMPLANT

## 2022-02-10 NOTE — Interval H&P Note (Signed)
History and Physical Interval Note:  02/10/2022 12:13 PM  Brandon [name]  has presented today for surgery, with the diagnosis of BLADDER NEOPLASM.  The various methods of treatment have been discussed with the patient and family. After consideration of risks, benefits and other options for treatment, the patient has consented to  Procedure(s) with comments: TRANSURETHRAL RESECTION OF BLADDER TUMOR WITH POSSIBLE URETERAL STENTAND POSSIBLE POST OP INSTILLATION OF GEMCITABINE (Bilateral) - 1 HR FOR CASE as a surgical intervention.  The patient's history has been reviewed, patient examined, no change in status, stable for surgery. No gross hematuria or dysuria. No fever. Disc need for prolonged stent and foley possible.  I have reviewed the patient's chart and labs.  Questions were answered to the patient's satisfaction.     Festus Aloe

## 2022-02-10 NOTE — Discharge Instructions (Signed)

## 2022-02-10 NOTE — Anesthesia Postprocedure Evaluation (Signed)
Anesthesia Post Note  Patient: Brandon [name]  Procedure(s) Performed: TRANSURETHRAL RESECTION OF BLADDER TUMOR  AND  POST OP INSTILLATION OF GEMCITABINE (Bilateral: Bladder)     Patient location during evaluation: PACU Anesthesia Type: General Level of consciousness: awake and alert Pain management: pain level controlled Vital Signs Assessment: post-procedure vital signs reviewed and stable Respiratory status: spontaneous breathing, nonlabored ventilation, respiratory function stable and patient connected to nasal cannula oxygen Cardiovascular status: blood pressure returned to baseline and stable Postop Assessment: no apparent nausea or vomiting Anesthetic complications: no  No notable events documented.  Last Vitals:  Vitals:   02/10/22 1345 02/10/22 1400  BP: (!) 152/111 (!) 156/89  Pulse: (!) 48 (!) 51  Resp: 17 12  Temp:    SpO2: 100% 100%    Last Pain:  Vitals:   02/10/22 1400  TempSrc:   PainSc: 0-No pain                 Barnet Glasgow

## 2022-02-10 NOTE — Anesthesia Procedure Notes (Signed)
Procedure Name: LMA Insertion Date/Time: 02/10/2022 12:19 PM  Performed by: Clearnce Sorrel, CRNAPre-anesthesia Checklist: Patient identified, Emergency Drugs available, Suction available and Patient being monitored Patient Re-evaluated:Patient Re-evaluated prior to induction Oxygen Delivery Method: Circle System Utilized Preoxygenation: Pre-oxygenation with 100% oxygen Induction Type: IV induction Ventilation: Mask ventilation without difficulty LMA: LMA inserted LMA Size: 4.0 Number of attempts: 1 Airway Equipment and Method: Bite block Placement Confirmation: positive ETCO2 Tube secured with: Tape Dental Injury: Teeth and Oropharynx as per pre-operative assessment

## 2022-02-10 NOTE — Op Note (Signed)
Preoperative diagnosis: Bladder neoplasm uncertain malignant potential Postoperative diagnosis: Same  Procedure: TURBT 2 to 5 cm, postoperative instillation of gemcitabine in PACU  Surgeon: Junious Silk  Anesthesia: General  Indication for procedure: Skip is a 70 year old male that had gross hematuria.  CT scan showed no metastatic disease but a small possible bladder tumor.  On cystoscopy he had some scattered papillary tumor at the right bladder neck around the right UO and posterior to the right UO.  Findings: On cystoscopy the urethra was unremarkable apart from a tight fossa navicularis that required dilation to 30 [place].  Prostate was borderline obstructive.  Bladder with papillary tumor at the right bladder neck, a few millimeters behind the right ureteral orifice and along the right posterior bladder wall and trigone back toward the midline.  No other tumor was noted.  The tumor was very superficial and swept off.  It was very fragile and difficult to collect for pathology.  Exam under anesthesia-this revealed a normal meatus and glans.  No penile lesions.  Scrotum appeared normal.  On DRE the prostate was about 30 g and smooth without hard area or nodule.  No palpable mass on bimanual.  Description of procedure: After consent was obtained patient brought to the operating room.  After adequate anesthesia he was placed in lithotomy position and prepped and draped in the usual sterile fashion.  Timeout was performed to confirm the patient and procedure.  Cystoscope was passed per urethra and the bladder carefully inspected with a 30 and 70 degree lens.  The fossa required dilation to 36 [place] and the 26 [place] resectoscope continuous-flow sheath was passed with the visual obturator and that was exchanged for the loop and handle.  I started at the right bladder neck and resected that tumor.  Right ureteral orifice was identified and I carefully swept the tumor away from the UO and was able to  preserve the UO.  The tumor was treated with a combination of very light resection, ablation of the tumor just over the surface and fulguration with sweeping the tumor off the surface.  This removed all the tumor.  It was a path of about 4 cm.  It was difficult but a few small papillary lesions were collected for pathology.  Hemostasis was excellent at low pressure.  Scope was removed and a 66 French Foley catheter was placed and left to gravity drainage.  Exam under anesthesia was performed.  He is awakened taken recovery room in stable condition.  Complications: None  Blood loss: Minimal  Specimens: Right bladder tumor to pathology  Drains: 16 French Foley  Disposition: Patient stable to PACU-I discussed the procedure, postop care and follow-up with his wife.  We went over the pictures of the tumor before and after.  I gave her a copy of the pictures.

## 2022-02-10 NOTE — Transfer of Care (Signed)
Immediate Anesthesia Transfer of Care Note  Patient: Brandon [name]  Procedure(s) Performed: TRANSURETHRAL RESECTION OF BLADDER TUMOR  AND  POST OP INSTILLATION OF GEMCITABINE (Bilateral: Bladder)  Patient Location: PACU  Anesthesia Type:General  Level of Consciousness: awake, alert , and oriented  Airway & Oxygen Therapy: Patient Spontanous Breathing  Post-op Assessment: Report given to RN and Post -op Vital signs reviewed and stable  Post vital signs: Reviewed and stable  Last Vitals:  Vitals Value Taken Time  BP    Temp    Pulse 57 02/10/22 1306  Resp 13 02/10/22 1306  SpO2 100 % 02/10/22 1306  Vitals shown include unvalidated device data.  Last Pain:  Vitals:   02/10/22 1030  TempSrc: Oral  PainSc: 4       Patients Stated Pain Goal: 4 (12/92/90 9030)  Complications: No notable events documented.

## 2022-02-13 ENCOUNTER — Encounter (HOSPITAL_BASED_OUTPATIENT_CLINIC_OR_DEPARTMENT_OTHER): Payer: Self-pay | Admitting: Urology

## 2022-02-13 LAB — SURGICAL PATHOLOGY

## 2022-02-21 DIAGNOSIS — M5416 Radiculopathy, lumbar region: Secondary | ICD-10-CM | POA: Diagnosis not present

## 2022-02-21 DIAGNOSIS — Z6826 Body mass index (BMI) 26.0-26.9, adult: Secondary | ICD-10-CM | POA: Diagnosis not present

## 2022-02-23 DIAGNOSIS — C672 Malignant neoplasm of lateral wall of bladder: Secondary | ICD-10-CM | POA: Diagnosis not present

## 2022-02-25 ENCOUNTER — Other Ambulatory Visit: Payer: Self-pay | Admitting: Internal Medicine

## 2022-02-25 DIAGNOSIS — K219 Gastro-esophageal reflux disease without esophagitis: Secondary | ICD-10-CM

## 2022-03-07 ENCOUNTER — Encounter (INDEPENDENT_AMBULATORY_CARE_PROVIDER_SITE_OTHER): Payer: Medicare HMO | Admitting: Internal Medicine

## 2022-03-07 ENCOUNTER — Other Ambulatory Visit: Payer: Self-pay | Admitting: Internal Medicine

## 2022-03-07 ENCOUNTER — Other Ambulatory Visit: Payer: Self-pay | Admitting: Neurological Surgery

## 2022-03-07 DIAGNOSIS — U071 COVID-19: Secondary | ICD-10-CM | POA: Diagnosis not present

## 2022-03-07 DIAGNOSIS — M5416 Radiculopathy, lumbar region: Secondary | ICD-10-CM

## 2022-03-07 MED ORDER — MOLNUPIRAVIR EUA 200MG CAPSULE: 4.0000 | ORAL_CAPSULE | Freq: Two times a day (BID) | ORAL | 0 refills | Status: AC

## 2022-03-07 NOTE — Telephone Encounter (Signed)
Please see the MyChart message reply(ies) for my assessment and plan.  The patient gave consent for this Medical Advice Message and is aware that it may result in a bill to their insurance company as well as the possibility that this may result in a co-payment or deductible. They are an established patient, but are not seeking medical advice exclusively about a problem treated during an in person or video visit in the last 7 days. I did not recommend an in person or video visit within 7 days of my reply.  I spent a total of 8 minutes cumulative time within 7 days through MyChart messaging Armel Dimitry Holsworth, MD  

## 2022-03-08 ENCOUNTER — Other Ambulatory Visit: Payer: Self-pay | Admitting: Neurological Surgery

## 2022-03-08 DIAGNOSIS — M5416 Radiculopathy, lumbar region: Secondary | ICD-10-CM

## 2022-03-09 ENCOUNTER — Other Ambulatory Visit: Payer: Self-pay | Admitting: Neurological Surgery

## 2022-03-09 DIAGNOSIS — M5416 Radiculopathy, lumbar region: Secondary | ICD-10-CM

## 2022-03-13 DIAGNOSIS — M5416 Radiculopathy, lumbar region: Secondary | ICD-10-CM | POA: Diagnosis not present

## 2022-03-15 DIAGNOSIS — I10 Essential (primary) hypertension: Secondary | ICD-10-CM | POA: Diagnosis not present

## 2022-03-15 DIAGNOSIS — G8929 Other chronic pain: Secondary | ICD-10-CM | POA: Diagnosis not present

## 2022-03-15 DIAGNOSIS — E519 Thiamine deficiency, unspecified: Secondary | ICD-10-CM | POA: Diagnosis not present

## 2022-03-15 DIAGNOSIS — I719 Aortic aneurysm of unspecified site, without rupture: Secondary | ICD-10-CM | POA: Diagnosis not present

## 2022-03-15 DIAGNOSIS — Z803 Family history of malignant neoplasm of breast: Secondary | ICD-10-CM | POA: Diagnosis not present

## 2022-03-15 DIAGNOSIS — Z8249 Family history of ischemic heart disease and other diseases of the circulatory system: Secondary | ICD-10-CM | POA: Diagnosis not present

## 2022-03-15 DIAGNOSIS — G4700 Insomnia, unspecified: Secondary | ICD-10-CM | POA: Diagnosis not present

## 2022-03-15 DIAGNOSIS — Z87891 Personal history of nicotine dependence: Secondary | ICD-10-CM | POA: Diagnosis not present

## 2022-03-15 DIAGNOSIS — Z8551 Personal history of malignant neoplasm of bladder: Secondary | ICD-10-CM | POA: Diagnosis not present

## 2022-03-15 DIAGNOSIS — K219 Gastro-esophageal reflux disease without esophagitis: Secondary | ICD-10-CM | POA: Diagnosis not present

## 2022-03-15 DIAGNOSIS — E785 Hyperlipidemia, unspecified: Secondary | ICD-10-CM | POA: Diagnosis not present

## 2022-03-15 DIAGNOSIS — Z825 Family history of asthma and other chronic lower respiratory diseases: Secondary | ICD-10-CM | POA: Diagnosis not present

## 2022-03-20 DIAGNOSIS — M5416 Radiculopathy, lumbar region: Secondary | ICD-10-CM | POA: Diagnosis not present

## 2022-03-21 ENCOUNTER — Other Ambulatory Visit: Payer: Medicare HMO

## 2022-03-21 ENCOUNTER — Ambulatory Visit
Admission: RE | Admit: 2022-03-21 | Discharge: 2022-03-21 | Disposition: A | Discharge status: Home / Self Care | Payer: Medicare HMO | Source: Ambulatory Visit | Attending: Neurological Surgery | Admitting: Neurological Surgery

## 2022-03-21 DIAGNOSIS — M5416 Radiculopathy, lumbar region: Secondary | ICD-10-CM

## 2022-03-21 DIAGNOSIS — M47817 Spondylosis without myelopathy or radiculopathy, lumbosacral region: Secondary | ICD-10-CM | POA: Diagnosis not present

## 2022-03-21 MED ORDER — GADOPICLENOL 0.5 MMOL/ML IV SOLN
7.5000 mL | Freq: Once | INTRAVENOUS | Status: AC | PRN
  Administered 2022-03-21: 7.5 mL via INTRAVENOUS

## 2022-03-27 DIAGNOSIS — M5416 Radiculopathy, lumbar region: Secondary | ICD-10-CM | POA: Diagnosis not present

## 2022-04-01 ENCOUNTER — Other Ambulatory Visit: Payer: Self-pay | Admitting: Internal Medicine

## 2022-04-01 DIAGNOSIS — E785 Hyperlipidemia, unspecified: Secondary | ICD-10-CM

## 2022-04-01 DIAGNOSIS — I10 Essential (primary) hypertension: Secondary | ICD-10-CM

## 2022-04-01 DIAGNOSIS — I712 Thoracic aortic aneurysm, without rupture, unspecified: Secondary | ICD-10-CM

## 2022-04-04 DIAGNOSIS — M5416 Radiculopathy, lumbar region: Secondary | ICD-10-CM | POA: Diagnosis not present

## 2022-04-06 DIAGNOSIS — M5416 Radiculopathy, lumbar region: Secondary | ICD-10-CM | POA: Diagnosis not present

## 2022-04-06 DIAGNOSIS — Z6825 Body mass index (BMI) 25.0-25.9, adult: Secondary | ICD-10-CM | POA: Diagnosis not present

## 2022-04-18 ENCOUNTER — Other Ambulatory Visit: Payer: Self-pay | Admitting: Internal Medicine

## 2022-04-18 DIAGNOSIS — I7121 Aneurysm of the ascending aorta, without rupture: Secondary | ICD-10-CM

## 2022-04-21 ENCOUNTER — Telehealth: Payer: Self-pay | Admitting: Family Medicine

## 2022-04-21 ENCOUNTER — Ambulatory Visit (INDEPENDENT_AMBULATORY_CARE_PROVIDER_SITE_OTHER): Payer: Medicare HMO

## 2022-04-21 ENCOUNTER — Other Ambulatory Visit: Payer: Self-pay | Admitting: Internal Medicine

## 2022-04-21 ENCOUNTER — Ambulatory Visit (INDEPENDENT_AMBULATORY_CARE_PROVIDER_SITE_OTHER): Payer: Medicare HMO | Admitting: Internal Medicine

## 2022-04-21 VITALS — BP 130/84 | HR 57 | Temp 97.8°F | Ht 70.0 in | Wt 182.0 lb

## 2022-04-21 DIAGNOSIS — M545 Low back pain, unspecified: Secondary | ICD-10-CM

## 2022-04-21 DIAGNOSIS — M25552 Pain in left hip: Secondary | ICD-10-CM | POA: Diagnosis not present

## 2022-04-21 DIAGNOSIS — R053 Chronic cough: Secondary | ICD-10-CM

## 2022-04-21 DIAGNOSIS — F5101 Primary insomnia: Secondary | ICD-10-CM

## 2022-04-21 DIAGNOSIS — I10 Essential (primary) hypertension: Secondary | ICD-10-CM

## 2022-04-21 DIAGNOSIS — R059 Cough, unspecified: Secondary | ICD-10-CM | POA: Diagnosis not present

## 2022-04-21 DIAGNOSIS — J309 Allergic rhinitis, unspecified: Secondary | ICD-10-CM | POA: Diagnosis not present

## 2022-04-21 DIAGNOSIS — N1831 Chronic kidney disease, stage 3a: Secondary | ICD-10-CM | POA: Diagnosis not present

## 2022-04-21 LAB — POC INFLUENZA A&B (BINAX/QUICKVUE)
Influenza A, POC: NEGATIVE
Influenza B, POC: NEGATIVE

## 2022-04-21 LAB — POC SOFIA SARS ANTIGEN FIA: SARS Coronavirus 2 Ag: NEGATIVE

## 2022-04-21 LAB — POCT RESPIRATORY SYNCYTIAL VIRUS: RSV Rapid Ag: NEGATIVE

## 2022-04-21 MED ORDER — ZOLPIDEM TARTRATE 10 MG PO TABS: 10.0000 mg | ORAL_TABLET | Freq: Every evening | ORAL | 0 refills | Status: DC | PRN

## 2022-04-21 MED ORDER — PREDNISONE 10 MG PO TABS: ORAL_TABLET | ORAL | 0 refills | Status: DC

## 2022-04-21 MED ORDER — KETOROLAC TROMETHAMINE 60 MG/2ML IM SOLN
60.0000 mg | Freq: Once | INTRAMUSCULAR | Status: AC
  Administered 2022-04-21: 60 mg via INTRAMUSCULAR

## 2022-04-21 NOTE — Telephone Encounter (Signed)
Patient's wife called stating that he is having very bad back pain this morning. They asked if he could come in for just a Toradol injection today to help with the pain?

## 2022-04-21 NOTE — Patient Instructions (Signed)
Your COVID, Flu and RSV testing was negative  Please take all new medication as prescribed - the prednisone  If this helps the allergies and cough, please take OTC Nasacort after the prednisone to help control it  Please continue all other medications as before, and refills have been done if requested.  Please have the pharmacy call with any other refills you may need.  Please keep your appointments with your specialists as you may have planned  Please go to the XRAY Department in the first floor for the x-ray testing  You will be contacted by phone if any changes need to be made immediately.  Otherwise, you will receive a letter about your results with an explanation, but please check with MyChart first.  Please remember to sign up for MyChart if you have not done so, as this will be important to you in the future with finding out test results, communicating by private email, and scheduling acute appointments online when needed.  Emmit Alexanders with your Surgury

## 2022-04-21 NOTE — Progress Notes (Unsigned)
Patient ID: Brandon [name], male   DOB: October 09, 1950, 72 y.o.   MRN: [id number]        Chief Complaint: follow up head congestion allergies, htn, ckd3a       HPI:  Brandon [name] is a 72 y.o. male here with c/o > 1 mo onset Does have several wks ongoing nasal allergy symptoms with clearish congestion, itch and sneezing, without fever, pain, ST, cough, swelling or wheezing.  Pt denies chest pain, increased sob or doe, wheezing, orthopnea, PND, increased LE swelling, palpitations, dizziness or syncope.   Pt denies polydipsia, polyuria, or new focal neuro s/s.    Pt denies fever, wt loss, night sweats, loss of appetite, or other constitutional symptoms  Cough worse at night to lie down.  Had covid infection in November 2023 but this seems different.  Did have exposure 2 wks ago to a child with RSV infection, but RSV testig neg today;  Has surgury soon       Wt Readings from Last 3 Encounters:  04/21/22 182 lb (82.6 kg)  02/10/22 180 lb 6.4 oz (81.8 kg)  11/08/21 198 lb (89.8 kg)   BP Readings from Last 3 Encounters:  04/21/22 130/84  02/10/22 132/73  11/08/21 100/72         Past Medical History:  Diagnosis Date   Allergy    mild   Aortic aneurysm (HCC)    small and watching. followed by his PCP Dr. Scarlette Calico, 02/06/2022   GERD (gastroesophageal reflux disease)    Head and neck cancer    head and neck ca dx 03/2009   Hyperlipidemia    Hypertension    Hypothyroidism    oropharyngeal ca dx'd 03/2009   chemo/xrt comp 06/2009   Swallowing difficulty    due to his cancer in neck and throat sometimes has problems swalling pills and had problem intubation due to radion to neck are. 02/06/2022   Thyroid disease    Wears glasses    reading 02/06/2022   Past Surgical History:  Procedure Laterality Date   CATARACT EXTRACTION Right    had it a couple of months ago   02/06/2022   HERNIA REPAIR     inguinal   hip replacemnt left Left    Left eye     stmach surgery     repair peg tube  site   TRANSURETHRAL RESECTION OF BLADDER TUMOR WITH MITOMYCIN-C Bilateral 02/10/2022   Procedure: TRANSURETHRAL RESECTION OF BLADDER TUMOR  AND  POST OP INSTILLATION OF GEMCITABINE;  Surgeon: Festus Aloe, MD;  Location: [facility];  Service: Urology;  Laterality: Bilateral;  1 HR FOR CASE    reports that he quit smoking about 13 years ago. His smoking use included cigarettes and cigars. He started smoking about 48 years ago. He has a 52.50 pack-year smoking history. He has never used smokeless tobacco. He reports current alcohol use of about 2.0 standard drinks of alcohol per week. He reports that he does not use drugs. family history includes Cancer in his father and mother; Colon cancer in his mother; Diabetes in his father; Hyperlipidemia in his father; Hypertension in his father. Allergies  Allergen Reactions   Benicar [Olmesartan]     fatigue   Amlodipine Swelling   Current Outpatient Medications on File Prior to Visit  Medication Sig Dispense Refill   aspirin 81 MG tablet Take 81 mg by mouth daily.     candesartan (ATACAND) 32 MG tablet TAKE 1 TABLET BY MOUTH  EVERY DAY 90 tablet 0   carvedilol (COREG) 6.25 MG tablet TAKE 1 TABLET BY MOUTH TWICE A DAY WITH FOOD 180 tablet 0   gabapentin (NEURONTIN) 300 MG capsule TAKE 1 CAPSULE BY MOUTH EVERYDAY AT BEDTIME (Patient taking differently: Take 300 mg by mouth at bedtime.) 90 capsule 1   levothyroxine (SYNTHROID) 100 MCG tablet TAKE 1 TABLET BY MOUTH EVERY DAY (Patient taking differently: Take 100 mcg by mouth daily before breakfast.) 90 tablet 1   Magnesium Oxide 400 MG CAPS Take 1 capsule (400 mg total) by mouth in the morning and at bedtime. 90 capsule 1   omeprazole (PRILOSEC) 40 MG capsule TAKE 1 CAPSULE BY MOUTH EVERY DAY 90 capsule 1   rosuvastatin (CRESTOR) 20 MG tablet TAKE 1 TABLET BY MOUTH EVERY DAY 90 tablet 0   Current Facility-Administered Medications on File Prior to Visit  Medication Dose Route Frequency  Provider Last Rate Last Admin   gemcitabine (GEMZAR) chemo syringe for bladder instillation 2,000 mg  2,000 mg Bladder Instillation Once Festus Aloe, MD            ROS:  All others reviewed and negative.  Objective        PE:  BP 130/84 (BP Location: Left Arm, Patient Position: Sitting, Cuff Size: Large)   Pulse (!) 57   Temp 97.8 F (36.6 C) (Oral)   Ht '5\' 10"'$  (1.778 m)   Wt 182 lb (82.6 kg)   SpO2 97%   BMI 26.11 kg/m                 Constitutional: Pt appears in NAD               HENT: Head: NCAT.                Right Ear: External ear normal.                 Left Ear: External ear normal.  Bilat tm's with mild erythema.  Max sinus areas mnon tender.  Pharynx with mild erythema, no exudate               Eyes: . Pupils are equal, round, and reactive to light. Conjunctivae and EOM are normal               Nose: without d/c or deformity               Neck: Neck supple. Gross normal ROM               Cardiovascular: Normal rate and regular rhythm.                 Pulmonary/Chest: Effort normal and breath sounds without rales or wheezing.                Neurological: Pt is alert. At baseline orientation, motor grossly intact               Skin: Skin is warm. No rashes, no other new lesions, LE edema -  none               Psychiatric: Pt behavior is normal without agitation   Micro: none  Cardiac tracings I have personally interpreted today:  none  Pertinent Radiological findings (summarize): none   Lab Results  Component Value Date   WBC 10.3 10/12/2021   HGB 14.6 02/10/2022   HCT 43.0 02/10/2022   PLT 314.0 10/12/2021   GLUCOSE 108 (H) 02/10/2022  CHOL 148 04/13/2021   TRIG 75.0 04/13/2021   HDL 80.30 04/13/2021   LDLDIRECT 147.7 12/03/2007   LDLCALC 52 04/13/2021   ALT 20 04/13/2021   AST 25 04/13/2021   NA 141 02/10/2022   K 4.2 02/10/2022   CL 103 02/10/2022   CREATININE 1.00 02/10/2022   BUN 13 02/10/2022   CO2 24 10/12/2021   TSH 4.46 10/12/2021    PSA 1.11 07/28/2021   INR 1.1 (H) 04/24/2018   HGBA1C 5.5 02/20/2017   POCT - COVID - neg, RSV - neg, Flu - neg  Assessment/Plan:  Brandon [name] is a 72 y.o. [place] or Caucasian [1] male with  has a past medical history of Allergy, Aortic aneurysm ([place]), GERD (gastroesophageal reflux disease), Head and neck cancer, Hyperlipidemia, Hypertension, Hypothyroidism, oropharyngeal ca (dx'd 03/2009), Swallowing difficulty, Thyroid disease, and Wears glasses.  Allergic rhinitis Mild to mod likely seasonal foare, for short low dose pred taper, then otc nasacort asd prn, to f/u any worsening symptoms or concerns  Essential hypertension, benign BP Readings from Last 3 Encounters:  04/21/22 130/84  02/10/22 132/73  11/08/21 100/72   Stable, pt to continue medical treatment atacand 32 mg qd, coreg 6.25 bid   Stage 3a chronic kidney disease ([place]) Lab Results  Component Value Date   CREATININE 1.00 02/10/2022   Stable overall, cont to avoid nephrotoxins, encourage f/u lab every 6 mo  Followup: Return if symptoms worsen or fail to improve.  Cathlean Cower, MD 04/22/2022 7:43 AM [facility]

## 2022-04-21 NOTE — Progress Notes (Signed)
Patient given Toradol '60mg'$  per Dr. Tamala Julian for back/hip pain. Patient tolerated injection well.

## 2022-04-21 NOTE — Telephone Encounter (Signed)
Patient given the injection and has an appointment with primary care at two. Given MD response

## 2022-04-22 ENCOUNTER — Encounter: Payer: Self-pay | Admitting: Internal Medicine

## 2022-04-22 DIAGNOSIS — J309 Allergic rhinitis, unspecified: Secondary | ICD-10-CM | POA: Insufficient documentation

## 2022-04-22 NOTE — Assessment & Plan Note (Signed)
BP Readings from Last 3 Encounters:  04/21/22 130/84  02/10/22 132/73  11/08/21 100/72   Stable, pt to continue medical treatment atacand 32 mg qd, coreg 6.25 bid

## 2022-04-22 NOTE — Assessment & Plan Note (Signed)
Mild to mod likely seasonal foare, for short low dose pred taper, then otc nasacort asd prn, to f/u any worsening symptoms or concerns

## 2022-04-22 NOTE — Assessment & Plan Note (Signed)
Lab Results  Component Value Date   CREATININE 1.00 02/10/2022   Stable overall, cont to avoid nephrotoxins, encourage f/u lab every 6 mo

## 2022-05-03 DIAGNOSIS — M5116 Intervertebral disc disorders with radiculopathy, lumbar region: Secondary | ICD-10-CM | POA: Diagnosis not present

## 2022-05-03 DIAGNOSIS — M5416 Radiculopathy, lumbar region: Secondary | ICD-10-CM | POA: Diagnosis not present

## 2022-05-16 ENCOUNTER — Other Ambulatory Visit: Payer: Self-pay | Admitting: Internal Medicine

## 2022-05-16 DIAGNOSIS — E039 Hypothyroidism, unspecified: Secondary | ICD-10-CM

## 2022-05-23 ENCOUNTER — Ambulatory Visit (INDEPENDENT_AMBULATORY_CARE_PROVIDER_SITE_OTHER): Payer: Medicare HMO

## 2022-05-23 ENCOUNTER — Ambulatory Visit (INDEPENDENT_AMBULATORY_CARE_PROVIDER_SITE_OTHER): Payer: Medicare HMO | Admitting: Family Medicine

## 2022-05-23 VITALS — BP 140/86 | HR 62 | Ht 70.0 in | Wt 183.0 lb

## 2022-05-23 DIAGNOSIS — M79675 Pain in left toe(s): Secondary | ICD-10-CM

## 2022-05-23 DIAGNOSIS — M7732 Calcaneal spur, left foot: Secondary | ICD-10-CM | POA: Diagnosis not present

## 2022-05-23 LAB — URIC ACID: Uric Acid, Serum: 6.3 mg/dL (ref 4.0–7.8)

## 2022-05-23 NOTE — Progress Notes (Signed)
   I, Peterson Lombard, LAT, ATC acting as a scribe for Lynne Leader, MD.  Brandon [name] is a 72 y.o. male who presents to [place] at [facility] today for left foot pain.  Patient was previously seen by Dr. Tamala Julian on 11/08/2021 for left hip and low back pain and has a history of DDD and just had back surgery on 05/03/22.  Today, patient presents with left foot pain ongoing for a couple weeks, worsened over the last 2 days.  Patient locates pain to head of the 1st MT. 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 Pt notes that it feels like something is in his foot. No hx of gout.  Swelling: yes- around 1st MTP Aggravates: walking,  Treatments tried: altering gate, changing shoes, pedicure, epsom salt soaks   Pertinent review of systems: No fevers or chills  Relevant historical information: Recently had a lumbar decompression surgery.  History of avascular necrosis of the hip. 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  Exam:  BP (!) 140/86   Pulse 62   Ht '5\' 10"'$  (1.778 m)   Wt 183 lb (83 kg)   SpO2 97%   BMI 26.26 kg/m  General: Well Developed, well nourished, and in no acute distress.   MSK: Left foot: Swelling at first MTP.  Mildly tender palpation plantar and medial first MTP.  Decreased range of motion first MTP.    Lab and Radiology Results  X-ray images left foot obtained today personally and independently interpreted Minimal DJD first MTP.  No acute fractures. Await formal radiology review     Assessment and Plan: 72 y.o. male with left first MTP pain and swelling without obvious cause or injury.  He did recently have back surgery and he has chronically elevated blood blood levels both of which are potential risk factor for gout.  Plan to check uric acid and treat accordingly if positive.  The meantime recommended dancers pad and a cam walker boot as needed.  Can return as soon as  1 week for steroid injection into the first MTP if needed.   PDMP not reviewed this encounter. Orders Placed This Encounter  Procedures   DG Foot Complete Left    Standing Status:   Future    Number of Occurrences:   1    Standing Expiration Date:   05/24/2023    Order Specific Question:   Reason for Exam (SYMPTOM  OR DIAGNOSIS REQUIRED)    Answer:   eval pain 1st MTP    Order Specific Question:   Preferred imaging location?    Answer:   [facility]   Uric acid    Standing Status:   Future    Number of Occurrences:   1    Standing Expiration Date:   05/24/2023   No orders of the defined types were placed in this encounter.    Discussed warning signs or symptoms. Please see discharge instructions. Patient expresses understanding.   The above documentation has been reviewed and is accurate and complete Lynne Leader, M.D.

## 2022-05-23 NOTE — Patient Instructions (Signed)
Thank you for coming in today.   Please get an Xray today before you leave   Please get labs today before you leave   Try a dancers pad and or a cam walker boot.   Ok to try metatarsal pad.   If gout if discovered I will prescribe the right medicine right away.   Keep me updated.   Recheck in 1 week if not improving.

## 2022-05-24 NOTE — Progress Notes (Signed)
Uric acid is 6.3.  This is a touch high but not high enough to screen gout. If your toe is not getting better with immobilization in the next few days I would recommend we try cortisone shot.

## 2022-05-25 DIAGNOSIS — Z8551 Personal history of malignant neoplasm of bladder: Secondary | ICD-10-CM | POA: Diagnosis not present

## 2022-05-26 NOTE — Progress Notes (Signed)
Left foot x-ray shows no fractures.

## 2022-06-05 ENCOUNTER — Encounter: Payer: Self-pay | Admitting: Internal Medicine

## 2022-06-05 ENCOUNTER — Ambulatory Visit (INDEPENDENT_AMBULATORY_CARE_PROVIDER_SITE_OTHER): Payer: Medicare HMO | Admitting: Internal Medicine

## 2022-06-05 ENCOUNTER — Ambulatory Visit
Admission: RE | Admit: 2022-06-05 | Discharge: 2022-06-05 | Disposition: A | Discharge status: Home / Self Care | Payer: Medicare HMO | Source: Ambulatory Visit | Attending: Internal Medicine | Admitting: Internal Medicine

## 2022-06-05 VITALS — BP 148/74 | HR 78 | Temp 98.2°F | Resp 16 | Ht 70.0 in | Wt 182.0 lb

## 2022-06-05 DIAGNOSIS — I10 Essential (primary) hypertension: Secondary | ICD-10-CM | POA: Diagnosis not present

## 2022-06-05 DIAGNOSIS — N1831 Chronic kidney disease, stage 3a: Secondary | ICD-10-CM

## 2022-06-05 DIAGNOSIS — E039 Hypothyroidism, unspecified: Secondary | ICD-10-CM | POA: Diagnosis not present

## 2022-06-05 DIAGNOSIS — I779 Disorder of arteries and arterioles, unspecified: Secondary | ICD-10-CM | POA: Diagnosis not present

## 2022-06-05 DIAGNOSIS — I712 Thoracic aortic aneurysm, without rupture, unspecified: Secondary | ICD-10-CM

## 2022-06-05 DIAGNOSIS — E785 Hyperlipidemia, unspecified: Secondary | ICD-10-CM

## 2022-06-05 DIAGNOSIS — N400 Enlarged prostate without lower urinary tract symptoms: Secondary | ICD-10-CM

## 2022-06-05 DIAGNOSIS — I7121 Aneurysm of the ascending aorta, without rupture: Secondary | ICD-10-CM | POA: Diagnosis not present

## 2022-06-05 DIAGNOSIS — I359 Nonrheumatic aortic valve disorder, unspecified: Secondary | ICD-10-CM | POA: Diagnosis not present

## 2022-06-05 DIAGNOSIS — Z0001 Encounter for general adult medical examination with abnormal findings: Secondary | ICD-10-CM | POA: Diagnosis not present

## 2022-06-05 DIAGNOSIS — J984 Other disorders of lung: Secondary | ICD-10-CM | POA: Diagnosis not present

## 2022-06-05 LAB — LIPID PANEL
Cholesterol: 136 mg/dL (ref 0–200)
HDL: 69.5 mg/dL (ref >39.00)
LDL Cholesterol: 54 mg/dL (ref 0–99)
NonHDL: 66.05
Total CHOL/HDL Ratio: 2
Triglycerides: 62 mg/dL (ref 0.0–149.0)
VLDL: 12.4 mg/dL (ref 0.0–40.0)

## 2022-06-05 LAB — HEPATIC FUNCTION PANEL
ALT: 15 U/L (ref 0–53)
AST: 25 U/L (ref 0–37)
Albumin: 3.8 g/dL (ref 3.5–5.2)
Alkaline Phosphatase: 78 U/L (ref 39–117)
Bilirubin, Direct: 0.1 mg/dL (ref 0.0–0.3)
Total Bilirubin: 0.4 mg/dL (ref 0.2–1.2)
Total Protein: 6.6 g/dL (ref 6.0–8.3)

## 2022-06-05 LAB — BASIC METABOLIC PANEL
BUN: 23 mg/dL (ref 6–23)
CO2: 25 mEq/L (ref 19–32)
Calcium: 9.6 mg/dL (ref 8.4–10.5)
Chloride: 108 mEq/L (ref 96–112)
Creatinine, Ser: 1.03 mg/dL (ref 0.40–1.50)
GFR: 73.78 mL/min (ref >60.00)
Glucose, Bld: 90 mg/dL (ref 70–99)
Potassium: 4.3 mEq/L (ref 3.5–5.1)
Sodium: 144 mEq/L (ref 135–145)

## 2022-06-05 LAB — TSH: TSH: 0.66 u[IU]/mL (ref 0.35–5.50)

## 2022-06-05 LAB — PSA: PSA: 1.35 ng/mL (ref 0.10–4.00)

## 2022-06-05 MED ORDER — ROSUVASTATIN CALCIUM 20 MG PO TABS: 20.0000 mg | ORAL_TABLET | Freq: Every day | ORAL | 1 refills | Status: DC

## 2022-06-05 MED ORDER — IOPAMIDOL (ISOVUE-370) INJECTION 76%
75.0000 mL | Freq: Once | INTRAVENOUS | Status: AC | PRN
  Administered 2022-06-05: 75 mL via INTRAVENOUS

## 2022-06-05 NOTE — Patient Instructions (Signed)
Health Maintenance, Male Adopting a healthy lifestyle and getting preventive care are important in promoting health and wellness. Ask your health care provider about: The right schedule for you to have regular tests and exams. Things you can do on your own to prevent diseases and keep yourself healthy. What should I know about diet, weight, and exercise? Eat a healthy diet  Eat a diet that includes plenty of vegetables, fruits, low-fat dairy products, and lean protein. Do not eat a lot of foods that are high in solid fats, added sugars, or sodium. Maintain a healthy weight Body mass index (BMI) is a measurement that can be used to identify possible weight problems. It estimates body fat based on height and weight. Your health care provider can help determine your BMI and help you achieve or maintain a healthy weight. Get regular exercise Get regular exercise. This is one of the most important things you can do for your health. Most adults should: Exercise for at least 150 minutes each week. The exercise should increase your heart rate and make you sweat (moderate-intensity exercise). Do strengthening exercises at least twice a week. This is in addition to the moderate-intensity exercise. Spend less time sitting. Even light physical activity can be beneficial. Watch cholesterol and blood lipids Have your blood tested for lipids and cholesterol at 71 years of age, then have this test every 5 years. You may need to have your cholesterol levels checked more often if: Your lipid or cholesterol levels are high. You are older than 71 years of age. You are at high risk for heart disease. What should I know about cancer screening? Many types of cancers can be detected early and may often be prevented. Depending on your health history and family history, you may need to have cancer screening at various ages. This may include screening for: Colorectal cancer. Prostate cancer. Skin cancer. Lung  cancer. What should I know about heart disease, diabetes, and high blood pressure? Blood pressure and heart disease High blood pressure causes heart disease and increases the risk of stroke. This is more likely to develop in people who have high blood pressure readings or are overweight. Talk with your health care provider about your target blood pressure readings. Have your blood pressure checked: Every 3-5 years if you are 18-39 years of age. Every year if you are 40 years old or older. If you are between the ages of 65 and 75 and are a current or former smoker, ask your health care provider if you should have a one-time screening for abdominal aortic aneurysm (AAA). Diabetes Have regular diabetes screenings. This checks your fasting blood sugar level. Have the screening done: Once every three years after age 45 if you are at a normal weight and have a low risk for diabetes. More often and at a younger age if you are overweight or have a high risk for diabetes. What should I know about preventing infection? Hepatitis B If you have a higher risk for hepatitis B, you should be screened for this virus. Talk with your health care provider to find out if you are at risk for hepatitis B infection. Hepatitis C Blood testing is recommended for: Everyone born from 1945 through 1965. Anyone with known risk factors for hepatitis C. Sexually transmitted infections (STIs) You should be screened each year for STIs, including gonorrhea and chlamydia, if: You are sexually active and are younger than 71 years of age. You are older than 71 years of age and your   health care provider tells you that you are at risk for this type of infection. Your sexual activity has changed since you were last screened, and you are at increased risk for chlamydia or gonorrhea. Ask your health care provider if you are at risk. Ask your health care provider about whether you are at high risk for HIV. Your health care provider  may recommend a prescription medicine to help prevent HIV infection. If you choose to take medicine to prevent HIV, you should first get tested for HIV. You should then be tested every 3 months for as long as you are taking the medicine. Follow these instructions at home: Alcohol use Do not drink alcohol if your health care provider tells you not to drink. If you drink alcohol: Limit how much you have to 0-2 drinks a day. Know how much alcohol is in your drink. In the [place] one drink equals one 12 oz bottle of beer (355 mL), one 5 oz glass of wine (148 mL), or one 1 oz glass of hard liquor (44 mL). Lifestyle Do not use any products that contain nicotine or tobacco. These products include cigarettes, chewing tobacco, and vaping devices, such as e-cigarettes. If you need help quitting, ask your health care provider. Do not use street drugs. Do not share needles. Ask your health care provider for help if you need support or information about quitting drugs. General instructions Schedule regular health, dental, and eye exams. Stay current with your vaccines. Tell your health care provider if: You often feel depressed. You have ever been abused or do not feel safe at home. Summary Adopting a healthy lifestyle and getting preventive care are important in promoting health and wellness. Follow your health care provider's instructions about healthy diet, exercising, and getting tested or screened for diseases. Follow your health care provider's instructions on monitoring your cholesterol and blood pressure. This information is not intended to replace advice given to you by your health care provider. Make sure you discuss any questions you have with your health care provider. Document Revised: 08/16/2020 Document Reviewed: 08/16/2020 Elsevier Patient Education  2023 Elsevier Inc.  

## 2022-06-05 NOTE — Progress Notes (Unsigned)
Subjective:  Patient ID: Brandon [name], male    DOB: 1950-10-30  Age: 72 y.o. MRN: [id number]  CC: Annual Exam, Hypertension, Hyperlipidemia, and Hypothyroidism   HPI TAMARCUS DROLL presents for a CPX and f/up ----  He is active and denies chest pain, shortness of breath, diaphoresis, or edema.  Outpatient Medications Prior to Visit  Medication Sig Dispense Refill   aspirin 81 MG tablet Take 81 mg by mouth daily.     candesartan (ATACAND) 32 MG tablet TAKE 1 TABLET BY MOUTH EVERY DAY 90 tablet 0   carvedilol (COREG) 6.25 MG tablet TAKE 1 TABLET BY MOUTH TWICE A DAY WITH FOOD 180 tablet 0   gabapentin (NEURONTIN) 300 MG capsule TAKE 1 CAPSULE BY MOUTH EVERYDAY AT BEDTIME (Patient taking differently: Take 300 mg by mouth at bedtime.) 90 capsule 1   levothyroxine (SYNTHROID) 100 MCG tablet TAKE 1 TABLET BY MOUTH EVERY DAY 90 tablet 0   Magnesium Oxide 400 MG CAPS Take 1 capsule (400 mg total) by mouth in the morning and at bedtime. 90 capsule 1   omeprazole (PRILOSEC) 40 MG capsule TAKE 1 CAPSULE BY MOUTH EVERY DAY 90 capsule 1   predniSONE (DELTASONE) 10 MG tablet 3 tabs by mouth per day for 3 days,2tabs per day for 3 days,1tab per day for 3 days 18 tablet 0   zolpidem (AMBIEN) 10 MG tablet Take 1 tablet (10 mg total) by mouth at bedtime as needed. for sleep 90 tablet 0   rosuvastatin (CRESTOR) 20 MG tablet TAKE 1 TABLET BY MOUTH EVERY DAY 90 tablet 0   Facility-Administered Medications Prior to Visit  Medication Dose Route Frequency Provider Last Rate Last Admin   gemcitabine (GEMZAR) chemo syringe for bladder instillation 2,000 mg  2,000 mg Bladder Instillation Once Festus Aloe, MD        ROS Review of Systems  Constitutional: Negative.  Negative for chills, diaphoresis, fatigue and fever.  HENT: Negative.    Eyes: Negative.   Respiratory:  Negative for chest tightness, shortness of breath and wheezing.   Cardiovascular:  Negative for chest pain, palpitations and leg  swelling.  Gastrointestinal:  Negative for abdominal pain, blood in stool, constipation, diarrhea, nausea and vomiting.  Endocrine: Negative.   Genitourinary: Negative.  Negative for difficulty urinating and dysuria.  Musculoskeletal: Negative.  Negative for myalgias.  Skin: Negative.   Neurological: Negative.  Negative for dizziness, weakness and light-headedness.  Hematological:  Negative for adenopathy. Does not bruise/bleed easily.  Psychiatric/Behavioral: Negative.      Objective:  BP (!) 148/74 (BP Location: Right Arm, Patient Position: Sitting, Cuff Size: Large)   Pulse 78   Temp 98.2 F (36.8 C) (Brandon)   Resp 16   Ht '5\' 10"'$  (1.778 m)   Wt 182 lb (82.6 kg)   SpO2 95%   BMI 26.11 kg/m   BP Readings from Last 3 Encounters:  06/05/22 (!) 148/74  05/23/22 (!) 140/86  04/21/22 130/84    Wt Readings from Last 3 Encounters:  06/05/22 182 lb (82.6 kg)  05/23/22 183 lb (83 kg)  04/21/22 182 lb (82.6 kg)    Physical Exam Vitals reviewed.  HENT:     Mouth/Throat:     Mouth: Mucous membranes are moist.  Eyes:     General: No scleral icterus.    Conjunctiva/sclera: Conjunctivae normal.  Cardiovascular:     Rate and Rhythm: Normal rate and regular rhythm. Occasional Extrasystoles are present.    Heart sounds: Normal heart sounds, S1  normal and S2 normal. No murmur heard.    No friction rub. No gallop.     Comments: EKG-  SR with 1st degree AV block with PAC's/bigeminy, 76 bpm No LVH or Q waves Pulmonary:     Effort: Pulmonary effort is normal.     Breath sounds: No stridor. No wheezing, rhonchi or rales.  Abdominal:     General: Abdomen is flat.     Palpations: There is no mass.     Tenderness: There is no abdominal tenderness. There is no guarding.     Hernia: No hernia is present. There is no hernia in the left inguinal area or right inguinal area.  Genitourinary:    Pubic Area: No rash.      Penis: Normal and circumcised.      Testes: Normal.     Epididymis:      Right: Normal.     Left: Normal.     Prostate: Normal. Not enlarged, not tender and no nodules present.     Rectum: Normal. Guaiac result negative. No mass, tenderness, anal fissure, external hemorrhoid or internal hemorrhoid. Normal anal tone.  Musculoskeletal:        General: No tenderness.     Cervical back: Neck supple.     Right lower leg: No edema.     Left lower leg: No edema.  Lymphadenopathy:     Cervical: No cervical adenopathy.     Lower Body: No right inguinal adenopathy. No left inguinal adenopathy.  Skin:    General: Skin is warm and dry.  Neurological:     General: No focal deficit present.     Mental Status: Mental status is at baseline.  Psychiatric:        Mood and Affect: Mood normal.        Behavior: Behavior normal.     Lab Results  Component Value Date   WBC 10.3 10/12/2021   HGB 14.6 02/10/2022   HCT 43.0 02/10/2022   PLT 314.0 10/12/2021   GLUCOSE 90 06/05/2022   CHOL 136 06/05/2022   TRIG 62.0 06/05/2022   HDL 69.50 06/05/2022   LDLDIRECT 147.7 12/03/2007   LDLCALC 54 06/05/2022   ALT 15 06/05/2022   AST 25 06/05/2022   NA 144 06/05/2022   K 4.3 06/05/2022   CL 108 06/05/2022   CREATININE 1.03 06/05/2022   BUN 23 06/05/2022   CO2 25 06/05/2022   TSH 0.66 06/05/2022   PSA 1.35 06/05/2022   INR 1.1 (H) 04/24/2018   HGBA1C 5.5 02/20/2017    CT ANGIO CHEST AORTA W/CM & OR WO/CM  Result Date: 06/05/2022 CLINICAL DATA:  Thoracic aortic disease. EXAM: CT ANGIOGRAPHY CHEST WITH CONTRAST TECHNIQUE: Multidetector CT imaging of the chest was performed using the standard protocol during bolus administration of intravenous contrast. Multiplanar CT image reconstructions and MIPs were obtained to evaluate the vascular anatomy. RADIATION DOSE REDUCTION: This exam was performed according to the departmental dose-optimization program which includes automated exposure control, adjustment of the mA and/or kV according to patient size and/or use of  iterative reconstruction technique. CONTRAST:  43m ISOVUE-370 IOPAMIDOL (ISOVUE-370) INJECTION 76% COMPARISON:  April 29, 2021. FINDINGS: Cardiovascular: Grossly stable 4.1 cm ascending thoracic aortic aneurysm. No dissection is noted. Normal cardiac size. No pericardial effusion. Mediastinum/Nodes: No significantly enlarged mediastinal, hilar, or axillary lymph nodes. Thyroid gland, trachea, and esophagus demonstrate no significant findings. Lungs/Pleura: No pneumothorax or pleural effusion is noted. Stable mild biapical scarring. No acute pulmonary abnormality is noted. Upper Abdomen:  No acute abnormality. Musculoskeletal: No chest wall abnormality. No acute or significant osseous findings. Review of the MIP images confirms the above findings. IMPRESSION: Grossly stable 4.1 cm ascending thoracic aortic aneurysm. Recommend annual imaging followup by CTA or MRA. This recommendation follows 2010 ACCF/AHA/AATS/ACR/ASA/SCA/SCAI/SIR/STS/SVM Guidelines for the Diagnosis and Management of Patients with Thoracic Aortic Disease. Circulation. 2010; [id number]. Aortic aneurysm NOS (ICD10-I71.9). Aortic Atherosclerosis (ICD10-I70.0). Electronically Signed   By: Marijo Conception M.D.   On: 06/05/2022 10:34    Assessment & Plan:   Kahle was seen today for annual exam, hypertension, hyperlipidemia and hypothyroidism.  Diagnoses and all orders for this visit:  Acquired hypothyroidism- He is euthyroid. -     TSH; Future -     TSH  Essential hypertension, benign- He has not achieved his blood pressure goal of 130/80. EKG is negative for LVH. He is working on his lifestyle modifications. -     Basic metabolic panel; Future -     Hepatic function panel; Future -     EKG 12-Lead -     Hepatic function panel -     Basic metabolic panel  Aneurysm of ascending aorta without rupture (HCC)- Size is 4.1 cm.  Will recheck this in 1 year.  Hyperlipidemia with target LDL less than 130- LDL goal achieved. Doing well  on the statin  -     Lipid panel; Future -     Hepatic function panel; Future -     Hepatic function panel -     Lipid panel -     rosuvastatin (CRESTOR) 20 MG tablet; Take 1 tablet (20 mg total) by mouth daily.  Encounter for general adult medical examination with abnormal findings- Exam completed, labs reviewed, vaccines reviewed and updated, cancer screenings are up-to-date, patient education was given.  Benign prostatic hyperplasia without lower urinary tract symptoms -     Cancel: Urinalysis, Routine w reflex microscopic; Future -     PSA; Future -     PSA  Stage 3a chronic kidney disease ([place])- His renal function has improved. -     Basic metabolic panel; Future -     Cancel: Urinalysis, Routine w reflex microscopic; Future -     Basic metabolic panel  Thoracic aortic aneurysm without rupture ([place])   I have changed Gildardo Griffes. Speece "Skip"'s rosuvastatin. I am also having him maintain his aspirin, gabapentin, Magnesium Oxide, omeprazole, candesartan, carvedilol, zolpidem, predniSONE, and levothyroxine.  Meds ordered this encounter  Medications   rosuvastatin (CRESTOR) 20 MG tablet    Sig: Take 1 tablet (20 mg total) by mouth daily.    Dispense:  90 tablet    Refill:  1     Follow-up: Return in about 6 months (around 12/04/2022).  Scarlette Calico, MD

## 2022-06-06 LAB — PSA SCREENING (EXT): PSA (EXT): 5.2 ng/mL (ref 0.1–7.2)

## 2022-06-06 LAB — HEMOGLOBIN A1C: HEMOGLOBIN A1C % (INT/EXT): 5.3 % (ref 4.6–5.6)

## 2022-07-01 ENCOUNTER — Other Ambulatory Visit: Payer: Self-pay | Admitting: Internal Medicine

## 2022-07-01 DIAGNOSIS — I10 Essential (primary) hypertension: Secondary | ICD-10-CM

## 2022-07-01 DIAGNOSIS — I712 Thoracic aortic aneurysm, without rupture, unspecified: Secondary | ICD-10-CM

## 2022-07-24 DIAGNOSIS — H02831 Dermatochalasis of right upper eyelid: Secondary | ICD-10-CM | POA: Diagnosis not present

## 2022-07-24 DIAGNOSIS — H43811 Vitreous degeneration, right eye: Secondary | ICD-10-CM | POA: Diagnosis not present

## 2022-07-24 DIAGNOSIS — H02834 Dermatochalasis of left upper eyelid: Secondary | ICD-10-CM | POA: Diagnosis not present

## 2022-07-24 DIAGNOSIS — H26492 Other secondary cataract, left eye: Secondary | ICD-10-CM | POA: Diagnosis not present

## 2022-07-24 DIAGNOSIS — H40053 Ocular hypertension, bilateral: Secondary | ICD-10-CM | POA: Diagnosis not present

## 2022-07-24 DIAGNOSIS — Z961 Presence of intraocular lens: Secondary | ICD-10-CM | POA: Diagnosis not present

## 2022-07-26 ENCOUNTER — Other Ambulatory Visit: Payer: Self-pay | Admitting: Internal Medicine

## 2022-07-26 DIAGNOSIS — F5101 Primary insomnia: Secondary | ICD-10-CM

## 2022-08-16 ENCOUNTER — Encounter: Payer: Self-pay | Admitting: Family Medicine

## 2022-08-16 ENCOUNTER — Other Ambulatory Visit: Payer: Self-pay

## 2022-08-16 DIAGNOSIS — M542 Cervicalgia: Secondary | ICD-10-CM

## 2022-08-17 ENCOUNTER — Other Ambulatory Visit: Payer: Self-pay | Admitting: Internal Medicine

## 2022-08-17 DIAGNOSIS — E039 Hypothyroidism, unspecified: Secondary | ICD-10-CM

## 2022-08-17 MED ORDER — UNITHROID 100 MCG PO TABS
100.0000 ug | ORAL_TABLET | Freq: Every day | ORAL | 0 refills | Status: DC
Start: 2022-08-17 — End: 2022-12-04

## 2022-08-31 ENCOUNTER — Ambulatory Visit
Admission: RE | Admit: 2022-08-31 | Discharge: 2022-08-31 | Disposition: A | Discharge status: Home / Self Care | Payer: Medicare HMO | Source: Ambulatory Visit | Attending: Family Medicine | Admitting: Family Medicine

## 2022-08-31 ENCOUNTER — Other Ambulatory Visit: Payer: Self-pay | Admitting: Internal Medicine

## 2022-08-31 DIAGNOSIS — K219 Gastro-esophageal reflux disease without esophagitis: Secondary | ICD-10-CM

## 2022-08-31 DIAGNOSIS — M47813 Spondylosis without myelopathy or radiculopathy, cervicothoracic region: Secondary | ICD-10-CM | POA: Diagnosis not present

## 2022-08-31 DIAGNOSIS — M542 Cervicalgia: Secondary | ICD-10-CM

## 2022-08-31 MED ORDER — IOPAMIDOL (ISOVUE-M 300) INJECTION 61%
1.0000 mL | Freq: Once | INTRAMUSCULAR | Status: AC | PRN
  Administered 2022-08-31: 1 mL via EPIDURAL

## 2022-08-31 MED ORDER — TRIAMCINOLONE ACETONIDE 40 MG/ML IJ SUSP (RADIOLOGY)
60.0000 mg | Freq: Once | INTRAMUSCULAR | Status: AC
  Administered 2022-08-31: 60 mg via EPIDURAL

## 2022-08-31 NOTE — Discharge Instructions (Signed)

## 2022-09-13 ENCOUNTER — Ambulatory Visit (INDEPENDENT_AMBULATORY_CARE_PROVIDER_SITE_OTHER): Payer: Medicare HMO | Admitting: Emergency Medicine

## 2022-09-13 VITALS — BP 126/78 | HR 54 | Temp 98.2°F | Ht 70.0 in | Wt 178.5 lb

## 2022-09-13 DIAGNOSIS — U071 COVID-19: Secondary | ICD-10-CM

## 2022-09-13 DIAGNOSIS — R6889 Other general symptoms and signs: Secondary | ICD-10-CM | POA: Diagnosis not present

## 2022-09-13 NOTE — Assessment & Plan Note (Signed)
Advised to rest and stay well-hydrated Tylenol as needed Advised to contact the office if no better or worse during the next several days.

## 2022-09-13 NOTE — Patient Instructions (Signed)
Start Paxlovid you have at home.  COVID-19 COVID-19 is an infection caused by a virus called SARS-CoV-2. This type of virus is called a coronavirus. People with COVID-19 may: Have little to no symptoms. Have mild to moderate symptoms that affect their lungs and breathing. Get very sick. What are the causes? COVID-19 is caused by a virus. This virus may be in the air as droplets or on surfaces. It can spread from an infected person when they cough, sneeze, speak, sing, or breathe. You may become infected if: You breathe in the infected droplets in the air. You touch an object that has the virus on it. What increases the risk? You are at risk of getting COVID-19 if you have been around someone with the infection. You may be more likely to get very sick if: You are 71 years old or older. You have certain medical conditions, such as: Heart disease. Diabetes. Chronic respiratory disease. Cancer. Pregnancy. You are immunocompromised. This means your body cannot fight infections easily. You have a disability or trouble moving, meaning you're immobile. What are the signs or symptoms? People may have different symptoms from COVID-19. The symptoms can also be mild to severe. They often show up in 5-6 days after being infected. But they can take up to 14 days to appear. Common symptoms are: Cough. Feeling tired. New loss of taste or smell. Fever. Less common symptoms are: Sore throat. Headache. Body or muscle aches. Diarrhea. A skin rash or odd-colored fingers or toes. Red or irritated eyes. Sometimes, COVID-19 does not cause symptoms. How is this diagnosed? COVID-19 can be diagnosed with tests done in the lab or at home. Fluid from your nose, mouth, or lungs will be used to check for the virus. How is this treated? Treatment for COVID-19 depends on how sick you are. Mild symptoms can be treated at home with rest, fluids, and over-the-counter medicines. Severe symptoms may be treated in  a hospital intensive care unit (ICU). If you have symptoms and are at risk of getting very sick, you may be given a medicine that fights viruses. This medicine is called an antiviral. How is this prevented? To protect yourself from COVID-19: Know your risk factors. Get vaccinated. If your body cannot fight infections easily, talk to your provider about treatment to help prevent COVID-19. Stay at least 1 meter away from others. Wear a well-fitted mask when: You can't stay at a distance from people. You're in a place with poor air flow. Try to be in open spaces with good air flow when in public. Wash your hands often or use an alcohol-based hand sanitizer. Cover your nose and mouth when coughing and sneezing. If you think you have COVID-19 or have been around someone who has it, stay home and be by yourself for 5-10 days. Where to find more information Centers for Disease Control and Prevention (CDC): [employer].no World Health Organization [facility]): VisitDestination.com.br Get help right away if: You have trouble breathing or get short of breath. You have pain or pressure in your chest. You cannot speak or move any part of your body. You are confused. Your symptoms get worse. These symptoms may be an emergency. Get help right away. Call 911. Do not wait to see if the symptoms will go away. Do not drive yourself to the hospital. This information is not intended to replace advice given to you by your health care provider. Make sure you discuss any questions you have with your health care provider. Document Revised: 04/04/2022  Document Reviewed: 12/09/2021 Elsevier Patient Education  2024 [employer].

## 2022-09-13 NOTE — Progress Notes (Signed)
Brandon [name] 72 y.o.   Chief Complaint  Patient presents with   Cough    Covid pos today, x 3 days    Sore Throat    HISTORY OF PRESENT ILLNESS: Acute problem visit today.  Patient of Dr. Sanda Linger. This is a 72 y.o. male complaining of flulike symptoms that started late last Sunday about 3 days ago Tested positive for COVID.  Mostly complaining of dry cough and sore throat with chest congestion No other associated symptoms No other complaints or medical concerns today.  Cough Associated symptoms include a sore throat. Pertinent negatives include no chest pain, chills, fever or headaches.  Sore Throat  Associated symptoms include congestion and coughing. Pertinent negatives include no abdominal pain, diarrhea, headaches or vomiting.     Prior to Admission medications   Medication Sig Start Date End Date Taking? Authorizing Provider  UNITHROID 100 MCG tablet Take 1 tablet (100 mcg total) by mouth daily before breakfast. 08/17/22   Etta Grandchild, MD  aspirin 81 MG tablet Take 81 mg by mouth daily.    [provider]  candesartan (ATACAND) 32 MG tablet TAKE 1 TABLET BY MOUTH EVERY DAY 07/01/22   Etta Grandchild, MD  carvedilol (COREG) 6.25 MG tablet TAKE 1 TABLET BY MOUTH TWICE A DAY WITH FOOD 07/01/22   Etta Grandchild, MD  gabapentin (NEURONTIN) 300 MG capsule TAKE 1 CAPSULE BY MOUTH EVERYDAY AT BEDTIME Patient taking differently: Take 300 mg by mouth at bedtime. 10/08/21   Etta Grandchild, MD  Magnesium Oxide 400 MG CAPS Take 1 capsule (400 mg total) by mouth in the morning and at bedtime. 10/12/21   Etta Grandchild, MD  omeprazole (PRILOSEC) 40 MG capsule TAKE 1 CAPSULE BY MOUTH EVERY DAY 08/31/22   Etta Grandchild, MD  predniSONE (DELTASONE) 10 MG tablet 3 tabs by mouth per day for 3 days,2tabs per day for 3 days,1tab per day for 3 days 04/21/22   Corwin Levins, MD  rosuvastatin (CRESTOR) 20 MG tablet Take 1 tablet (20 mg total) by mouth daily. 06/05/22   Etta Grandchild,  MD  zolpidem (AMBIEN) 10 MG tablet TAKE 1 TABLET BY MOUTH AT BEDTIME AS NEEDED. FOR SLEEP 07/27/22   Etta Grandchild, MD    Allergies  Allergen Reactions   Benicar [Olmesartan]     fatigue   Amlodipine Swelling    Patient Active Problem List   Diagnosis Date Noted   Allergic rhinitis 04/22/2022   Stage 3a chronic kidney disease (HCC) 10/12/2021   Need for prophylactic vaccination and inoculation against varicella 10/12/2021   Encounter for general adult medical examination with abnormal findings 04/13/2021   Degenerative disc disease, lumbar 03/14/2021   Ventral hernia without obstruction or gangrene 10/06/2020   Degenerative disc disease, cervical 09/18/2018   Thiamine deficiency 04/27/2018   Renal cyst, left 04/24/2018   Alcohol abuse, continuous 04/24/2018   Lead exposure 04/24/2018   Benign prostatic hyperplasia without lower urinary tract symptoms 04/23/2017   Hypomagnesemia 02/20/2017   Hyperlipidemia with target LDL less than 130 09/09/2014   Hypothyroidism 09/09/2014   Thoracic aortic aneurysm without rupture (HCC) 03/13/2014   Neuropathy due to chemotherapeutic drug (HCC) 09/08/2013   Insomnia 07/30/2013   Essential hypertension, benign 07/30/2013   CARCINOMA, SQUAMOUS CELL 04/28/2010   GERD 02/11/2007    Past Medical History:  Diagnosis Date   Allergy    mild   Aortic aneurysm (HCC)    small and watching. followed by  his PCP Dr. Sanda Linger, 02/06/2022   GERD (gastroesophageal reflux disease)    Head and neck cancer    head and neck ca dx 03/2009   Hyperlipidemia    Hypertension    Hypothyroidism    oropharyngeal ca dx'd 03/2009   chemo/xrt comp 06/2009   Swallowing difficulty    due to his cancer in neck and throat sometimes has problems swalling pills and had problem intubation due to radion to neck are. 02/06/2022   Thyroid disease    Wears glasses    reading 02/06/2022    Past Surgical History:  Procedure Laterality Date   CATARACT EXTRACTION  Right    had it a couple of months ago   02/06/2022   HERNIA REPAIR     inguinal   hip replacemnt left Left    Left eye     stmach surgery     repair peg tube site   TRANSURETHRAL RESECTION OF BLADDER TUMOR WITH MITOMYCIN-C Bilateral 02/10/2022   Procedure: TRANSURETHRAL RESECTION OF BLADDER TUMOR  AND  POST OP INSTILLATION OF GEMCITABINE;  Surgeon: Jerilee Field, MD;  Location: [facility];  Service: Urology;  Laterality: Bilateral;  1 HR FOR CASE    Social History   Socioeconomic History   Marital status: Married    Spouse name: Not on file   Number of children: 2   Years of education: 15   Highest education level: Some college, no degree  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 History   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  Tobacco Use   Smoking status: Former    Packs/day: 1.50    Years: 35.00    Additional pack years: 0.00    Total pack years: 52.50    Types: Cigarettes, Cigars    Start date: 05/16/1973    Quit date: 04/19/2009    Years since quitting: 13.4   Smokeless tobacco: Never   Tobacco comments:    quit smoking 5 years sago  Vaping Use   Vaping Use: Never used  Substance and Sexual Activity   Alcohol use: Yes    Alcohol/week: 2.0 standard drinks of alcohol    Types: 2 Shots of liquor per week   Drug use: No   Sexual activity: Yes    Partners: Female  Other Topics Concern   Not on file  Social History Narrative   HSG, [employer]-no degree. married - '76 - 5 years, divorced; remarried  '84. 1 son - '85; 1 daughter '89. 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    Social Determinants of Health   Financial Resource Strain: Low Risk  (09/13/2022)   Overall Financial Resource Strain (CARDIA)    Difficulty of Paying Living Expenses: Not very hard  Food Insecurity: No Food Insecurity (09/13/2022)   Hunger Vital Sign    Worried About Running Out of Food in the Last Year: Never true    Ran Out of Food in the Last Year: Never true   Transportation Needs: No Transportation Needs (09/13/2022)   PRAPARE - Administrator, Civil Service (Medical): No    Lack of Transportation (Non-Medical): No  Physical Activity: Unknown (09/13/2022)   Exercise Vital Sign    Days of Exercise per Week: 4 days    Minutes of Exercise per Session: Not on file  Stress: No Stress Concern Present (09/13/2022)   [employer] of 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 Health - 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 Stress Questionnaire    Feeling of Stress : Not at all  Social Connections: Socially  Isolated (09/13/2022)   Social Connection and Isolation Panel [NHANES]    Frequency of Communication with Friends and Family: Once a week    Frequency of Social Gatherings with Friends and Family: Once a week    Attends Religious Services: Never    Database administrator or Organizations: No    Attends Engineer, structural: Not on file    Marital Status: Married  Catering manager Violence: Not on file    Family History  Problem Relation Age of Onset   Cancer Mother        lymphoma   Colon cancer Mother    Cancer Father        lung (smoker)   Hyperlipidemia Father    Hypertension Father    Diabetes Father    Esophageal cancer Neg Hx    Rectal cancer Neg Hx    Stomach cancer Neg Hx      Review of Systems  Constitutional: Negative.  Negative for chills and fever.  HENT:  Positive for congestion and sore throat.   Respiratory:  Positive for cough.   Cardiovascular: Negative.  Negative for chest pain and palpitations.  Gastrointestinal:  Negative for abdominal pain, diarrhea, nausea and vomiting.  Genitourinary: Negative.  Negative for dysuria and hematuria.  Neurological: Negative.  Negative for dizziness and headaches.    Vitals:   09/13/22 1548  BP: 126/78  Pulse: (!) 54  Temp: 98.2 F (36.8 C)  SpO2: 98%    Physical Exam Vitals reviewed.  Constitutional:      Appearance: Normal appearance.  HENT:     Head: Normocephalic.     Right Ear: Tympanic  membrane, ear canal and external ear normal.     Left Ear: Tympanic membrane, ear canal and external ear normal.     Nose: Congestion present.     Mouth/Throat:     Mouth: Mucous membranes are moist.     Pharynx: Oropharynx is clear.  Eyes:     Extraocular Movements: Extraocular movements intact.     Conjunctiva/sclera: Conjunctivae normal.     Pupils: Pupils are equal, round, and reactive to light.  Cardiovascular:     Rate and Rhythm: Normal rate and regular rhythm.     Pulses: Normal pulses.     Heart sounds: Normal heart sounds.  Pulmonary:     Effort: Pulmonary effort is normal.     Breath sounds: Normal breath sounds.  Abdominal:     Palpations: Abdomen is soft.     Tenderness: There is no abdominal tenderness.  Musculoskeletal:     Cervical back: No tenderness.  Lymphadenopathy:     Cervical: No cervical adenopathy.  Skin:    General: Skin is warm and dry.     Capillary Refill: Capillary refill takes less than 2 seconds.  Neurological:     General: No focal deficit present.     Mental Status: He is alert and oriented to person, place, and time.  Psychiatric:        Mood and Affect: Mood normal.        Behavior: Behavior normal.      ASSESSMENT & PLAN: A total of 30 minutes was spent with the patient and counseling/coordination of care regarding preparing for this visit, review of most recent office visit notes, review of chronic medical conditions under management, review of all medications, diagnosis of COVID infection and need for antiviral medication, prognosis, documentation, and need for follow-up.  Problem List Items Addressed This Visit  Other   COVID-19 virus infection    ED precautions given COVID advice given Recommend to start Paxlovid twice a day for 5 days Patient states he has medication at home Normal kidney function test.  Not on anticoagulants.      Flu-like symptoms - Primary    Advised to rest and stay well-hydrated Tylenol as  needed Advised to contact the office if no better or worse during the next several days.      Patient Instructions  Start Paxlovid you have at home.  COVID-19 COVID-19 is an infection caused by a virus called SARS-CoV-2. This type of virus is called a coronavirus. People with COVID-19 may: Have little to no symptoms. Have mild to moderate symptoms that affect their lungs and breathing. Get very sick. What are the causes? COVID-19 is caused by a virus. This virus may be in the air as droplets or on surfaces. It can spread from an infected person when they cough, sneeze, speak, sing, or breathe. You may become infected if: You breathe in the infected droplets in the air. You touch an object that has the virus on it. What increases the risk? You are at risk of getting COVID-19 if you have been around someone with the infection. You may be more likely to get very sick if: You are 43 years old or older. You have certain medical conditions, such as: Heart disease. Diabetes. Chronic respiratory disease. Cancer. Pregnancy. You are immunocompromised. This means your body cannot fight infections easily. You have a disability or trouble moving, meaning you're immobile. What are the signs or symptoms? People may have different symptoms from COVID-19. The symptoms can also be mild to severe. They often show up in 5-6 days after being infected. But they can take up to 14 days to appear. Common symptoms are: Cough. Feeling tired. New loss of taste or smell. Fever. Less common symptoms are: Sore throat. Headache. Body or muscle aches. Diarrhea. A skin rash or odd-colored fingers or toes. Red or irritated eyes. Sometimes, COVID-19 does not cause symptoms. How is this diagnosed? COVID-19 can be diagnosed with tests done in the lab or at home. Fluid from your nose, mouth, or lungs will be used to check for the virus. How is this treated? Treatment for COVID-19 depends on how sick you  are. Mild symptoms can be treated at home with rest, fluids, and over-the-counter medicines. Severe symptoms may be treated in a hospital intensive care unit (ICU). If you have symptoms and are at risk of getting very sick, you may be given a medicine that fights viruses. This medicine is called an antiviral. How is this prevented? To protect yourself from COVID-19: Know your risk factors. Get vaccinated. If your body cannot fight infections easily, talk to your provider about treatment to help prevent COVID-19. Stay at least 1 meter away from others. Wear a well-fitted mask when: You can't stay at a distance from people. You're in a place with poor air flow. Try to be in open spaces with good air flow when in public. Wash your hands often or use an alcohol-based hand sanitizer. Cover your nose and mouth when coughing and sneezing. If you think you have COVID-19 or have been around someone who has it, stay home and be by yourself for 5-10 days. Where to find more information Centers for Disease Control and Prevention (CDC): TonerPromos.no World Health Organization [facility]): VisitDestination.com.br Get help right away if: You have trouble breathing or get short of breath. You have  pain or pressure in your chest. You cannot speak or move any part of your body. You are confused. Your symptoms get worse. These symptoms may be an emergency. Get help right away. Call 911. Do not wait to see if the symptoms will go away. Do not drive yourself to the hospital. This information is not intended to replace advice given to you by your health care provider. Make sure you discuss any questions you have with your health care provider. Document Revised: 04/04/2022 Document Reviewed: 12/09/2021 Elsevier Patient Education  2024 Elsevier Inc.    Edwina Barth, MD [facility]

## 2022-09-13 NOTE — Assessment & Plan Note (Signed)
ED precautions given COVID advice given Recommend to start Paxlovid twice a day for 5 days Patient states he has medication at home Normal kidney function test.  Not on anticoagulants.

## 2022-09-29 ENCOUNTER — Other Ambulatory Visit: Payer: Self-pay | Admitting: Internal Medicine

## 2022-09-29 DIAGNOSIS — I10 Essential (primary) hypertension: Secondary | ICD-10-CM

## 2022-10-04 ENCOUNTER — Other Ambulatory Visit: Payer: Self-pay | Admitting: Internal Medicine

## 2022-10-04 DIAGNOSIS — H53483 Generalized contraction of visual field, bilateral: Secondary | ICD-10-CM | POA: Diagnosis not present

## 2022-10-04 DIAGNOSIS — H02834 Dermatochalasis of left upper eyelid: Secondary | ICD-10-CM | POA: Diagnosis not present

## 2022-10-04 DIAGNOSIS — H0279 Other degenerative disorders of eyelid and periocular area: Secondary | ICD-10-CM | POA: Diagnosis not present

## 2022-10-04 DIAGNOSIS — H02831 Dermatochalasis of right upper eyelid: Secondary | ICD-10-CM | POA: Diagnosis not present

## 2022-10-04 DIAGNOSIS — H57813 Brow ptosis, bilateral: Secondary | ICD-10-CM | POA: Diagnosis not present

## 2022-10-04 DIAGNOSIS — Z01818 Encounter for other preprocedural examination: Secondary | ICD-10-CM | POA: Diagnosis not present

## 2022-10-04 DIAGNOSIS — I10 Essential (primary) hypertension: Secondary | ICD-10-CM

## 2022-10-04 DIAGNOSIS — I712 Thoracic aortic aneurysm, without rupture, unspecified: Secondary | ICD-10-CM

## 2022-10-09 NOTE — Progress Notes (Signed)
[facility] [address] [place] [address] Phone: [phone] Subjective:   INadine Counts, am serving as a scribe for Dr. Antoine Primas.  I'm seeing this patient by the request  of:  Etta Grandchild, MD  CC: Neck and back pain   [id number]  Brandon [name] is a 72 y.o. male coming in with complaint of neck pain. Epidural 08/31/2022. Last seen in August 2023 for back pain. Patient states still having some pain. Epi wasn't as effective as the one before.        Past Medical History:  Diagnosis Date   Allergy    mild   Aortic aneurysm (HCC)    small and watching. followed by his PCP Dr. Sanda Linger, 02/06/2022   GERD (gastroesophageal reflux disease)    Head and neck cancer    head and neck ca dx 03/2009   Hyperlipidemia    Hypertension    Hypothyroidism    oropharyngeal ca dx'd 03/2009   chemo/xrt comp 06/2009   Swallowing difficulty    due to his cancer in neck and throat sometimes has problems swalling pills and had problem intubation due to radion to neck are. 02/06/2022   Thyroid disease    Wears glasses    reading 02/06/2022   Past Surgical History:  Procedure Laterality Date   CATARACT EXTRACTION Right    had it a couple of months ago   02/06/2022   HERNIA REPAIR     inguinal   hip replacemnt left Left    Left eye     stmach surgery     repair peg tube site   TRANSURETHRAL RESECTION OF BLADDER TUMOR WITH MITOMYCIN-C Bilateral 02/10/2022   Procedure: TRANSURETHRAL RESECTION OF BLADDER TUMOR  AND  POST OP INSTILLATION OF GEMCITABINE;  Surgeon: Jerilee Field, MD;  Location: [facility];  Service: Urology;  Laterality: Bilateral;  1 HR FOR CASE   Social History   Socioeconomic History   Marital status: Married    Spouse name: Not on file   Number of children: 2   Years of education: 15   Highest education level: Some college, no degree  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 History   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  Tobacco Use   Smoking status: Former    Packs/day: 1.50    Years: 35.00    Additional pack years: 0.00    Total pack years: 52.50    Types: Cigarettes, Cigars    Start date: 05/16/1973    Quit date: 04/19/2009    Years since quitting: 13.4   Smokeless tobacco: Never   Tobacco comments:    quit smoking 5 years sago  Vaping Use   Vaping Use: Never used  Substance and Sexual Activity   Alcohol use: Yes    Alcohol/week: 2.0 standard drinks of alcohol    Types: 2 Shots of liquor per week   Drug use: No   Sexual activity: Yes    Partners: Female  Other Topics Concern   Not on file  Social History Narrative   HSG, [employer]-no degree. married - '76 - 5 years, divorced; remarried  '84. 1 son - '85; 1 daughter '89. 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    Social Determinants of Health   Financial Resource Strain: Low Risk  (09/13/2022)   Overall Financial Resource Strain (CARDIA)    Difficulty of Paying Living Expenses: Not very hard  Food Insecurity: No Food Insecurity (09/13/2022)  Hunger Vital Sign    Worried About Running Out of Food in the Last Year: Never true    Ran Out of Food in the Last Year: Never true  Transportation Needs: No Transportation Needs (09/13/2022)   PRAPARE - Administrator, Civil Service (Medical): No    Lack of Transportation (Non-Medical): No  Physical Activity: Unknown (09/13/2022)   Exercise Vital Sign    Days of Exercise per Week: 4 days    Minutes of Exercise per Session: Not on file  Stress: No Stress Concern Present (09/13/2022)   [employer] of 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 Health - 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 Stress Questionnaire    Feeling of Stress : Not at all  Social Connections: Socially Isolated (09/13/2022)   Social Connection and Isolation Panel [NHANES]    Frequency of Communication with Friends and Family: Once a week    Frequency of Social Gatherings with Friends and Family: Once a week    Attends Religious Services:  Never    Database administrator or Organizations: No    Attends Engineer, structural: Not on file    Marital Status: Married   Allergies  Allergen Reactions   Benicar [Olmesartan]     fatigue   Amlodipine Swelling   Family History  Problem Relation Age of Onset   Cancer Mother        lymphoma   Colon cancer Mother    Cancer Father        lung (smoker)   Hyperlipidemia Father    Hypertension Father    Diabetes Father    Esophageal cancer Neg Hx    Rectal cancer Neg Hx    Stomach cancer Neg Hx     Current Outpatient Medications (Endocrine & Metabolic):    predniSONE (DELTASONE) 20 MG tablet, Take 1 tablet (20 mg total) by mouth daily with breakfast.   UNITHROID 100 MCG tablet, Take 1 tablet (100 mcg total) by mouth daily before breakfast.   predniSONE (DELTASONE) 10 MG tablet, 3 tabs by mouth per day for 3 days,2tabs per day for 3 days,1tab per day for 3 days   Current Outpatient Medications (Cardiovascular):    candesartan (ATACAND) 32 MG tablet, TAKE 1 TABLET BY MOUTH EVERY DAY   carvedilol (COREG) 6.25 MG tablet, TAKE 1 TABLET BY MOUTH TWICE A DAY WITH FOOD   rosuvastatin (CRESTOR) 20 MG tablet, Take 1 tablet (20 mg total) by mouth daily.     Current Outpatient Medications (Analgesics):    aspirin 81 MG tablet, Take 81 mg by mouth daily.     Current Outpatient Medications (Other):    gabapentin (NEURONTIN) 300 MG capsule, TAKE 1 CAPSULE BY MOUTH EVERYDAY AT BEDTIME (Patient taking differently: Take 300 mg by mouth at bedtime.)   Magnesium Oxide 400 MG CAPS, Take 1 capsule (400 mg total) by mouth in the morning and at bedtime.   omeprazole (PRILOSEC) 40 MG capsule, TAKE 1 CAPSULE BY MOUTH EVERY DAY   zolpidem (AMBIEN) 10 MG tablet, TAKE 1 TABLET BY MOUTH AT BEDTIME AS NEEDED. FOR SLEEP   Facility-Administered Medications Ordered in Other Visits (Other):    gemcitabine (GEMZAR) chemo syringe for bladder instillation 2,000 mg No current  facility-administered medications for this visit.   Reviewed prior external information including notes and imaging from  primary care provider As well as notes that were available from care everywhere and other healthcare systems.  Past medical history, social, surgical and family history all reviewed in electronic medical record.  No pertanent information  unless stated regarding to the chief complaint.   Review of Systems:  No headache, visual changes, nausea, vomiting, diarrhea, constipation, dizziness, abdominal pain, skin rash, fevers, chills, night sweats, weight loss, swollen lymph nodes, body aches, joint swelling, chest pain, shortness of breath, mood changes. POSITIVE muscle aches  Objective  Blood pressure (!) 130/90, pulse (!) 52, height 5\' 10"  (1.778 m), weight 179 lb (81.2 kg), SpO2 98 %.   General: No apparent distress alert and oriented x3 mood and affect normal, dressed appropriately.  HEENT: Pupils equal, extraocular movements intact  Respiratory: Patient's speak in full sentences and does not appear short of breath  Cardiovascular: No lower extremity edema, non tender, no erythema  Neck exam shows lack of extension noted still of the neck.  5 out of 5 strength of the upper extremities noted.  Sitting comfortably in the chair.      Impression and Recommendations:      The above documentation has been reviewed and is accurate and complete Judi Saa, DO

## 2022-10-10 ENCOUNTER — Ambulatory Visit: Payer: Medicare HMO | Admitting: Family Medicine

## 2022-10-10 VITALS — BP 130/90 | HR 52 | Ht 70.0 in | Wt 179.0 lb

## 2022-10-10 DIAGNOSIS — M5030 Other cervical disc degeneration, unspecified cervical region: Secondary | ICD-10-CM | POA: Diagnosis not present

## 2022-10-10 MED ORDER — PREDNISONE 20 MG PO TABS
20.0000 mg | ORAL_TABLET | Freq: Every day | ORAL | 0 refills | Status: DC
Start: 2022-10-10 — End: 2022-12-06

## 2022-10-10 NOTE — Patient Instructions (Signed)
Prednisone 20mg  for 7 days when needed Avoid rep extension of neck Write [place] when you need another epidural

## 2022-10-10 NOTE — Assessment & Plan Note (Signed)
Patient is 1 month out from his epidural and is doing significantly better.  No significant radicular symptoms at this time.  Continue the gabapentin at 300 mg.  Did discuss potentially adding of the Cymbalta.  Patient can have another 1 in 2 to 3 months if necessary.  Patient given prednisone as a wait-and-see prescription with him doing more of his glasses.  This.  Follow-up with me again on an as-needed basis otherwise.

## 2022-10-19 ENCOUNTER — Other Ambulatory Visit: Payer: Self-pay

## 2022-10-19 ENCOUNTER — Ambulatory Visit: Payer: Medicare HMO | Admitting: Family Medicine

## 2022-10-19 VITALS — BP 128/88 | HR 56 | Ht 70.0 in | Wt 179.0 lb

## 2022-10-19 DIAGNOSIS — M79675 Pain in left toe(s): Secondary | ICD-10-CM

## 2022-10-19 NOTE — Patient Instructions (Addendum)
Thank you for coming in today.   Please get labs today before you leave   Take the predniosne if you need to.   We could use colchicine for gout but I would have to switch your blood pressure medicine and I will talk with Dr Yetta Barre before if needed.

## 2022-10-19 NOTE — Progress Notes (Signed)
Rubin Payor, PhD, LAT, ATC acting as a scribe for Clementeen Graham, MD.  Brandon [name] is a 72 y.o. male who presents to [facility] at [facility] today for L Great toe pain. 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 Pt was last seen by Dr. Denyse Amass for this issue on 05/23/22 and was advised to use dancers pad and a CAM walker boot prn. His uric acid was also checked.   Today, pt reports L Great toe pain has been off and no. He's been wearing the CAM walker boot sometimes. He locates pain to the L 1st MT and at the head of the 1st MT, more dorsally, w/ swelling and redness present.  Dx testing: 05/23/22 Uric acid & L foot XR  Pertinent review of systems: No fevers or chills  Relevant historical information: Heart disease.  Takes Coreg No known history of gout  Exam:  BP 128/88   Pulse (!) 56   Ht 5\' 10"  (1.778 m)   Wt 179 lb (81.2 kg)   SpO2 96%   BMI 25.68 kg/m  General: Well Developed, well nourished, and in no acute distress.   MSK: Left great toe slight erythema and swelling.  Mildly tender palpation.  Normal toe motion.    Lab and Radiology Results  Narrative & Impression  CLINICAL DATA:  Pain at first metatarsophalangeal joint. No known injury.   EXAM: LEFT FOOT - COMPLETE 3+ VIEW   COMPARISON:  None Available.   FINDINGS: There is no evidence of fracture or dislocation. A sclerotic region is noted in the distal phalanx of the second digit with no aggressive features. No periosteal elevation or bony erosion is seen. Moderate calcaneal spurring. Enthesopathic changes are noted at the insertion site of the Achilles tendon. Soft tissues are unremarkable.   IMPRESSION: No acute osseous abnormality.     Electronically Signed   By: Thornell Sartorius M.D.   On: 05/25/2022 21:43  I, Clementeen Graham, personally (independently) visualized and performed the interpretation of the images attached in this note.  Diagnostic Limited MSK Ultrasound of: Left great toe first  MTP Mild effusion present. Impression: Mild effusion   Lab Results  Component Value Date   LABURIC 6.3 05/23/2022     Assessment and Plan: 72 y.o. male with left toe pain.  Pain is already improving today from earlier.  He is has these episodes of waxing and waning toe pain that seem very consistent with gout to me.  His x-ray does not show a lot of great toe arthritis.  I think his symptoms are probably gout.  Will go ahead and check uric acid again.  Ideally I would like to use colchicine.  However there is a drug interaction with his Coreg.  I did a little interaction check and we could substitute metoprolol or bisoprolol for Coreg.  That would be safe to use with colchicine.  If uric acid is elevated we will start allopurinol and talk to his PCP about swapping Coreg for a different beta-blocker that is compatible with colchicine.  For now he does have some prednisones that he can use if he has a flareup of pain.   PDMP not reviewed this encounter. Orders Placed This Encounter  Procedures   [place] LIMITED JOINT SPACE STRUCTURES LOW LEFT(NO LINKED CHARGES)    Order Specific Question:   Reason for Exam (SYMPTOM  OR DIAGNOSIS REQUIRED)    Answer:   left toe pain    Order Specific Question:  Preferred imaging location?    Answer:   [facility]   Uric acid    Standing Status:   Future    Number of Occurrences:   1    Standing Expiration Date:   10/19/2023   No orders of the defined types were placed in this encounter.    Discussed warning signs or symptoms. Please see discharge instructions. Patient expresses understanding.   The above documentation has been reviewed and is accurate and complete Clementeen Graham, M.D.

## 2022-10-20 ENCOUNTER — Telehealth: Payer: Self-pay | Admitting: Family Medicine

## 2022-10-20 LAB — URIC ACID: Uric Acid, Serum: 8.3 mg/dL — ABNORMAL HIGH (ref 4.0–7.8)

## 2022-10-20 MED ORDER — ALLOPURINOL 300 MG PO TABS: 300.0000 mg | ORAL_TABLET | Freq: Every day | ORAL | 3 refills | Status: DC

## 2022-10-20 MED ORDER — BISOPROLOL FUMARATE 5 MG PO TABS: 5.0000 mg | ORAL_TABLET | Freq: Every day | ORAL | 1 refills | Status: DC

## 2022-10-20 MED ORDER — COLCHICINE 0.6 MG PO TABS: 0.6000 mg | ORAL_TABLET | Freq: Every day | ORAL | 2 refills | Status: AC | PRN

## 2022-10-20 NOTE — Telephone Encounter (Signed)
I called Brandon [name] and Brandon [name]. We talked about the medicine changes.  Recommend recheck in 1 month.

## 2022-10-20 NOTE — Progress Notes (Signed)
Your uric acid level is significantly elevated at 8.3.  This is gout.  Gout is almost certainly what is causing your pain intermittently.  I just sent Dr. Yetta Barre a message asking about switching that blood pressure medicine around so I can prescribe colchicine.  For now I have prescribed allopurinol which is a medicine use to lower uric acid in the long run.  This is good to be helpful.  Please start taking it now.  Please return in 1 month.  Will recheck uric acid at that time.

## 2022-10-20 NOTE — Addendum Note (Signed)
Addended by: Rodolph Bong on: 10/20/2022 01:04 PM   Modules accepted: Orders

## 2022-10-24 ENCOUNTER — Ambulatory Visit: Admit: 2022-10-24 | Discharge: 2022-10-24 | Payer: MEDICARE | Attending: Neurology | Primary: Internal Medicine

## 2022-10-24 DIAGNOSIS — R569 Unspecified convulsions: Secondary | ICD-10-CM

## 2022-10-24 NOTE — H&P (Signed)
Neurology follow-up      Today's Date: 10/25/2022  MRN: [id number]  Name: [name] [name]  DOB: 20-Feb-1951    Chief Complaint  Seizure disorder    History Of Present Illness  [name] [name] is a 72 y.o. male presenting with the above, he has been stable although in the past he has had periods where current medication levels became elevated sometimes asymptomatically other times with some ataxic element but he has been stable for quite some time last visit has been more than 1 year he is here to reestablish a relationship continue ability to get refills and to ensure that standing orders are available locally [facility] [place].  He has no complaints     Past Medical History  He has a past medical history of Acquired ptosis of eyelid of both eyes (04/23/2019), Blepharitis of both upper and lower eyelid (04/23/2019), Irregular astigmatism of both eyes (11/01/2018), Macular cyst, hole, or pseudohole, left eye (04/25/2018), and Macular hole.    Surgical History  He has no past surgical history on file.     Social History  He reports that he has quit smoking. His smoking use included cigarettes. He does not have any smokeless tobacco history on file. No history on file for alcohol use and drug use.    Family History  Family History   Problem Relation Name Age of Onset    COPD Mother      Glaucoma Father      Heart disease Father          Advance Care Plan  Extended Emergency Contact Information  Primary Emergency Contact: Mauney,Mary  Mobile Phone: [phone]  Relation: Spouse  Preferred language: English  No Order         Allergies  Patient has no known allergies.     Medications    Current Outpatient Medications:     atorvastatin (Lipitor) 40 mg tablet, Take 40 mg by mouth in the morning., Disp: , Rfl:     chlorthalidone (Hygroton) 25 mg tablet, Take 25 mg by mouth in the morning., Disp: , Rfl:     Dilantin 30 mg capsule, TAKE UP TO 3 CAPSULES DAILY, MAY UNDERGO TITRATION AS DIRECTED, Disp: 270 capsule, Rfl: 3     Dilantin Extended 100 mg capsule, TAKE 5 CAPSULES BY MOUTH EVERY MORNING TAKE WITH 30 MG DOSE, Disp: 450 capsule, Rfl: 3    docusate sodium (Colace) 100 mg capsule, TAKE 1 CAPSULE DAILY., Disp: , Rfl:     ginkgo biloba 40 mg tablet, Take 40 mg by mouth., Disp: , Rfl:     lisinopril 20 mg tablet, Take 20 mg by mouth in the morning., Disp: , Rfl:     omeprazole (PriLOSEC) 20 mg DR capsule, Take 20 mg by mouth in the morning., Disp: , Rfl:     Medications       None            Review of Systems    Objective   Physical Exam    Last Recorded Vitals  There were no vitals taken for this visit.    Relevant Results       Assessment/Plan       72 year old with seizure disorder, appointment converted to telemedicine audio only as video could not work for the encounter and/or patient preference, all questions answered, will proceed as per his wishes, seizure safety RV precautions continue, refills when needed, standing labs when needed, it was a pleasure evaluating today  and I look forward to seeing him again return visit 1 year and/or as needed  [name], MD

## 2022-10-25 DIAGNOSIS — H53483 Generalized contraction of visual field, bilateral: Secondary | ICD-10-CM | POA: Diagnosis not present

## 2022-10-31 ENCOUNTER — Other Ambulatory Visit: Payer: Self-pay | Admitting: Internal Medicine

## 2022-10-31 DIAGNOSIS — F5101 Primary insomnia: Secondary | ICD-10-CM

## 2022-10-31 MED ORDER — ZOLPIDEM TARTRATE 10 MG PO TABS
10.0000 mg | ORAL_TABLET | Freq: Every evening | ORAL | 0 refills | Status: DC | PRN
Start: 2022-10-31 — End: 2022-11-28

## 2022-11-20 ENCOUNTER — Other Ambulatory Visit: Payer: Self-pay | Admitting: Internal Medicine

## 2022-11-20 DIAGNOSIS — E039 Hypothyroidism, unspecified: Secondary | ICD-10-CM

## 2022-11-24 ENCOUNTER — Other Ambulatory Visit: Payer: Self-pay | Admitting: Internal Medicine

## 2022-11-24 DIAGNOSIS — E785 Hyperlipidemia, unspecified: Secondary | ICD-10-CM

## 2022-11-26 ENCOUNTER — Other Ambulatory Visit: Payer: Self-pay | Admitting: Internal Medicine

## 2022-11-26 DIAGNOSIS — K219 Gastro-esophageal reflux disease without esophagitis: Secondary | ICD-10-CM

## 2022-11-27 ENCOUNTER — Ambulatory Visit: Payer: Medicare HMO | Admitting: Family Medicine

## 2022-11-28 ENCOUNTER — Other Ambulatory Visit: Payer: Self-pay | Admitting: Internal Medicine

## 2022-11-28 DIAGNOSIS — F5101 Primary insomnia: Secondary | ICD-10-CM

## 2022-11-29 DIAGNOSIS — Z8551 Personal history of malignant neoplasm of bladder: Secondary | ICD-10-CM | POA: Diagnosis not present

## 2022-11-30 ENCOUNTER — Encounter: Payer: Self-pay | Admitting: Internal Medicine

## 2022-11-30 MED ORDER — ZOLPIDEM TARTRATE 10 MG PO TABS
10.0000 mg | ORAL_TABLET | Freq: Every evening | ORAL | 0 refills | Status: AC | PRN
Start: 2022-11-30 — End: ?

## 2022-12-04 ENCOUNTER — Other Ambulatory Visit: Payer: Self-pay | Admitting: Internal Medicine

## 2022-12-04 DIAGNOSIS — E039 Hypothyroidism, unspecified: Secondary | ICD-10-CM

## 2022-12-05 MED ORDER — UNITHROID 100 MCG PO TABS: 100.0000 ug | ORAL_TABLET | Freq: Every day | ORAL | 0 refills | Status: DC

## 2022-12-05 NOTE — Progress Notes (Unsigned)
   Rubin Payor, PhD, LAT, ATC acting as a scribe for Clementeen Graham, MD.  Brandon [name] is a 72 y.o. male who presents to [facility] at [facility] today for f/u gout. Pt was last seen by Dr. Denyse Amass on 10/19/22 and labs were checked. Pt's PCP was communicated w/ to switch his BP medication. Pt canceled his 9-month f/u.  Today, pt reports his gout has been well controled no recent flares. No pain. He's leaving next week for a trip to the [place].  Dx testing: 10/19/22 Uric acid 05/23/22 Uric acid & L foot XR   Pertinent review of systems: No fevers or chills  Relevant historical information: Hypertension.  Gout.  CKD.   Exam:  BP (!) 150/86   Pulse (!) 57   Ht 5\' 10"  (1.778 m)   Wt 181 lb (82.1 kg)   SpO2 97%   BMI 25.97 kg/m  General: Well Developed, well nourished, and in no acute distress.   MSK: No significant toe inflammation bilaterally.  No swelling.  Normal gait.    Lab and Radiology Results    Chemistry      Component Value Date/Time   NA 144 06/05/2022 1409   NA 145 12/30/2014 1158   K 4.3 06/05/2022 1409   K 3.5 12/30/2014 1158   CL 108 06/05/2022 1409   CL 99 06/12/2014 0903   CO2 25 06/05/2022 1409   CO2 28 12/30/2014 1158   BUN 23 06/05/2022 1409   BUN 23.2 12/30/2014 1158   CREATININE 1.03 06/05/2022 1409   CREATININE 1.24 10/22/2019 1048   CREATININE 0.8 12/30/2014 1158      Component Value Date/Time   CALCIUM 9.6 06/05/2022 1409   CALCIUM 9.2 12/30/2014 1158   ALKPHOS 78 06/05/2022 1409   ALKPHOS 68 12/30/2014 1158   AST 25 06/05/2022 1409   AST 33 12/30/2014 1158   ALT 15 06/05/2022 1409   ALT 22 12/30/2014 1158   BILITOT 0.4 06/05/2022 1409   BILITOT 0.71 12/30/2014 1158     Lab Results  Component Value Date   LABURIC 8.3 (H) 10/19/2022       Assessment and Plan: 72 y.o. male with gout.  Currently on allopurinol.  Will recheck uric acid and metabolic panel.  Will get results back we will go ahead and  refill allopurinol and colchicine.  Check back as needed.   PDMP not reviewed this encounter. Orders Placed This Encounter  Procedures   Uric acid    Standing Status:   Future    Number of Occurrences:   1    Standing Expiration Date:   12/06/2023   Basic metabolic panel    Standing Status:   Future    Number of Occurrences:   1    Standing Expiration Date:   12/06/2023   No orders of the defined types were placed in this encounter.    Discussed warning signs or symptoms. Please see discharge instructions. Patient expresses understanding.   The above documentation has been reviewed and is accurate and complete Clementeen Graham, M.D.

## 2022-12-06 ENCOUNTER — Ambulatory Visit: Payer: Medicare HMO | Admitting: Family Medicine

## 2022-12-06 VITALS — BP 150/86 | HR 57 | Ht 70.0 in | Wt 181.0 lb

## 2022-12-06 DIAGNOSIS — M1A0720 Idiopathic chronic gout, left ankle and foot, without tophus (tophi): Secondary | ICD-10-CM

## 2022-12-06 LAB — BASIC METABOLIC PANEL
BUN: 32 mg/dL — ABNORMAL HIGH (ref 6–23)
CO2: 23 mEq/L (ref 19–32)
Calcium: 9.4 mg/dL (ref 8.4–10.5)
Chloride: 107 mEq/L (ref 96–112)
Creatinine, Ser: 1.29 mg/dL (ref 0.40–1.50)
GFR: 56.12 mL/min — ABNORMAL LOW (ref >60.00)
Glucose, Bld: 102 mg/dL — ABNORMAL HIGH (ref 70–99)
Potassium: 4 mEq/L (ref 3.5–5.1)
Sodium: 140 mEq/L (ref 135–145)

## 2022-12-06 LAB — URIC ACID: Uric Acid, Serum: 2.7 mg/dL — ABNORMAL LOW (ref 4.0–7.8)

## 2022-12-06 NOTE — Patient Instructions (Signed)
Thank you for coming in today.   Please get labs today before you leave   Let see what the labs say and then refill your medicine and adjust dosage if needed, based on your levels.

## 2022-12-07 MED ORDER — ALLOPURINOL 300 MG PO TABS: 300.0000 mg | ORAL_TABLET | Freq: Every day | ORAL | 3 refills | Status: AC

## 2022-12-07 NOTE — Progress Notes (Signed)
Labs look okay.  Continue current dose of allopurinol.  I have sent a 90 day supply to your pharmacy with refills.  Use colchicine as needed.

## 2022-12-07 NOTE — Addendum Note (Signed)
Addended by: Rodolph Bong on: 12/07/2022 06:18 AM   Modules accepted: Orders

## 2022-12-26 ENCOUNTER — Other Ambulatory Visit: Payer: Self-pay | Admitting: Family Medicine

## 2022-12-29 ENCOUNTER — Other Ambulatory Visit: Payer: Self-pay | Admitting: Internal Medicine

## 2022-12-29 DIAGNOSIS — I10 Essential (primary) hypertension: Secondary | ICD-10-CM

## 2023-01-06 ENCOUNTER — Other Ambulatory Visit: Payer: Self-pay | Admitting: Internal Medicine

## 2023-01-06 DIAGNOSIS — I712 Thoracic aortic aneurysm, without rupture, unspecified: Secondary | ICD-10-CM

## 2023-01-06 DIAGNOSIS — I10 Essential (primary) hypertension: Secondary | ICD-10-CM

## 2023-01-09 ENCOUNTER — Encounter: Payer: Self-pay | Admitting: Internal Medicine

## 2023-01-09 ENCOUNTER — Ambulatory Visit (INDEPENDENT_AMBULATORY_CARE_PROVIDER_SITE_OTHER): Payer: Medicare HMO | Admitting: Internal Medicine

## 2023-01-09 VITALS — BP 136/82 | HR 82 | Temp 97.6°F | Resp 16 | Ht 70.0 in | Wt 179.0 lb

## 2023-01-09 DIAGNOSIS — N1831 Chronic kidney disease, stage 3a: Secondary | ICD-10-CM | POA: Insufficient documentation

## 2023-01-09 DIAGNOSIS — I10 Essential (primary) hypertension: Secondary | ICD-10-CM | POA: Insufficient documentation

## 2023-01-09 DIAGNOSIS — E039 Hypothyroidism, unspecified: Secondary | ICD-10-CM | POA: Diagnosis not present

## 2023-01-09 DIAGNOSIS — Z77011 Contact with and (suspected) exposure to lead: Secondary | ICD-10-CM

## 2023-01-09 DIAGNOSIS — R251 Tremor, unspecified: Secondary | ICD-10-CM | POA: Diagnosis not present

## 2023-01-09 DIAGNOSIS — E785 Hyperlipidemia, unspecified: Secondary | ICD-10-CM

## 2023-01-09 DIAGNOSIS — K219 Gastro-esophageal reflux disease without esophagitis: Secondary | ICD-10-CM | POA: Diagnosis not present

## 2023-01-09 DIAGNOSIS — F5101 Primary insomnia: Secondary | ICD-10-CM

## 2023-01-09 DIAGNOSIS — M4722 Other spondylosis with radiculopathy, cervical region: Secondary | ICD-10-CM | POA: Diagnosis not present

## 2023-01-09 DIAGNOSIS — R9431 Abnormal electrocardiogram [ECG] [EKG]: Secondary | ICD-10-CM

## 2023-01-09 DIAGNOSIS — Z6825 Body mass index (BMI) 25.0-25.9, adult: Secondary | ICD-10-CM | POA: Diagnosis not present

## 2023-01-09 LAB — HEPATIC FUNCTION PANEL
ALT: 26 U/L (ref 0–53)
AST: 48 U/L — ABNORMAL HIGH (ref 0–37)
Albumin: 3.9 g/dL (ref 3.5–5.2)
Alkaline Phosphatase: 96 U/L (ref 39–117)
Bilirubin, Direct: 0.1 mg/dL (ref 0.0–0.3)
Total Bilirubin: 0.5 mg/dL (ref 0.2–1.2)
Total Protein: 6.7 g/dL (ref 6.0–8.3)

## 2023-01-09 LAB — URINALYSIS, ROUTINE W REFLEX MICROSCOPIC
Bilirubin Urine: NEGATIVE
Hgb urine dipstick: NEGATIVE
Ketones, ur: NEGATIVE
Leukocytes,Ua: NEGATIVE
Nitrite: NEGATIVE
Specific Gravity, Urine: 1.025 (ref 1.000–1.030)
Urine Glucose: NEGATIVE
Urobilinogen, UA: 0.2 (ref 0.0–1.0)
pH: 6 (ref 5.0–8.0)

## 2023-01-09 LAB — CBC WITH DIFFERENTIAL/PLATELET
Basophils Absolute: 0 10*3/uL (ref 0.0–0.1)
Basophils Relative: 0.8 % (ref 0.0–3.0)
Eosinophils Absolute: 0.2 10*3/uL (ref 0.0–0.7)
Eosinophils Relative: 3.5 % (ref 0.0–5.0)
HCT: 40.9 % (ref 39.0–52.0)
Hemoglobin: 13.1 g/dL (ref 13.0–17.0)
Lymphocytes Relative: 17.6 % (ref 12.0–46.0)
Lymphs Abs: 1 10*3/uL (ref 0.7–4.0)
MCHC: 31.9 g/dL (ref 30.0–36.0)
MCV: 91.2 fl (ref 78.0–100.0)
Monocytes Absolute: 0.4 10*3/uL (ref 0.1–1.0)
Monocytes Relative: 7.3 % (ref 3.0–12.0)
Neutro Abs: 4.2 10*3/uL (ref 1.4–7.7)
Neutrophils Relative %: 70.8 % (ref 43.0–77.0)
Platelets: 315 10*3/uL (ref 150.0–400.0)
RBC: 4.49 Mil/uL (ref 4.22–5.81)
RDW: 16.4 % — ABNORMAL HIGH (ref 11.5–15.5)
WBC: 6 10*3/uL (ref 4.0–10.5)

## 2023-01-09 LAB — TSH: TSH: 0.71 u[IU]/mL (ref 0.35–5.50)

## 2023-01-09 LAB — MAGNESIUM: Magnesium: 1.1 mg/dL — ABNORMAL LOW (ref 1.5–2.5)

## 2023-01-09 MED ORDER — MAGNESIUM OXIDE 400 MG PO CAPS
1.0000 | ORAL_CAPSULE | Freq: Two times a day (BID) | ORAL | 1 refills | Status: DC
Start: 2023-01-09 — End: 2023-07-19

## 2023-01-09 MED ORDER — ZOLPIDEM TARTRATE 10 MG PO TABS
10.0000 mg | ORAL_TABLET | Freq: Every evening | ORAL | 0 refills | Status: DC | PRN
Start: 2023-01-09 — End: 2023-04-13

## 2023-01-09 NOTE — Patient Instructions (Signed)

## 2023-01-09 NOTE — Progress Notes (Unsigned)
Subjective:  Patient ID: Brandon [name], male    DOB: January 13, 1951  Age: 72 y.o. MRN: [id number]  CC: Hypertension, Gastroesophageal Reflux, and Hypothyroidism   HPI ESCHOL MELEAR presents for f/up --  Discussed the use of AI scribe software for clinical note transcription with the patient, who gave verbal consent to proceed.  History of Present Illness   The patient, with a history of hypertension and C-spine DDD, presents with a concern of an increasing tremor over the past two months. He reports that the tremor, which has been present for years, has recently become more noticeable. The tremor is present in both the hands and legs, as observed by the patient and his partner. The patient denies any associated difficulty in walking, talking, rigidity, stiffness, or imbalance.  The patient's blood pressure has been well-controlled, and he denies any symptoms of hypertension such as headache or blurred vision. He also denies any symptoms suggestive of thyroid dysfunction, such as weight changes. The patient's current medications, including carvedilol, do not appear to have any effect on the tremor.  The patient is scheduled to see a neurosurgeon for his neck condition, and there is a discussion about a possible referral to a neurologist for further evaluation of the tremor. The patient remains active and denies any chest pain or shortness of breath. He has been compliant with recommended vaccinations and report regular bowel movements.       Outpatient Medications Prior to Visit  Medication Sig Dispense Refill   allopurinol (ZYLOPRIM) 300 MG tablet Take 1 tablet (300 mg total) by mouth daily. 90 tablet 3   aspirin 81 MG tablet Take 81 mg by mouth daily.     bisoprolol (ZEBETA) 5 MG tablet Take 1 tablet (5 mg total) by mouth daily. 90 tablet 1   candesartan (ATACAND) 32 MG tablet TAKE 1 TABLET BY MOUTH EVERY DAY 30 tablet 0   colchicine 0.6 MG tablet Take 1 tablet (0.6 mg total) by mouth  daily as needed (gout or psuedogout pain). 30 tablet 2   gabapentin (NEURONTIN) 300 MG capsule TAKE 1 CAPSULE BY MOUTH EVERYDAY AT BEDTIME (Patient taking differently: Take 300 mg by mouth at bedtime.) 90 capsule 1   omeprazole (PRILOSEC) 40 MG capsule TAKE 1 CAPSULE BY MOUTH EVERY DAY 90 capsule 0   predniSONE (DELTASONE) 20 MG tablet TAKE 1 TABLET BY MOUTH DAILY WITH BREAKFAST 7 tablet 0   rosuvastatin (CRESTOR) 20 MG tablet TAKE 1 TABLET BY MOUTH EVERY DAY 90 tablet 0   UNITHROID 100 MCG tablet Take 1 tablet (100 mcg total) by mouth daily before breakfast. 90 tablet 0   Magnesium Oxide 400 MG CAPS Take 1 capsule (400 mg total) by mouth in the morning and at bedtime. 90 capsule 1   zolpidem (AMBIEN) 10 MG tablet Take 1 tablet (10 mg total) by mouth at bedtime as needed. for sleep 30 tablet 0   gemcitabine (GEMZAR) chemo syringe for bladder instillation 2,000 mg      No facility-administered medications prior to visit.    ROS Review of Systems  Psychiatric/Behavioral:  Positive for confusion, decreased concentration and sleep disturbance. The patient is nervous/anxious.     Objective:  BP 136/82 (BP Location: Left Arm, Patient Position: Sitting, Cuff Size: Large)   Pulse 82   Temp 97.6 F (36.4 C) (Oral)   Resp 16   Ht 5\' 10"  (1.778 m)   Wt 179 lb (81.2 kg)   SpO2 98%   BMI 25.68 kg/m  BP Readings from Last 3 Encounters:  01/09/23 136/82  12/06/22 (!) 150/86  10/19/22 128/88    Wt Readings from Last 3 Encounters:  01/09/23 179 lb (81.2 kg)  12/06/22 181 lb (82.1 kg)  10/19/22 179 lb (81.2 kg)    Physical Exam Cardiovascular:     Rate and Rhythm: Normal rate and regular rhythm.     Heart sounds: Normal heart sounds, S1 normal and S2 normal. No murmur heard.    No gallop.     Comments: EKG- SR with 1st degree AV block Low voltage Septal infarct pattern is old unchanged  Musculoskeletal:     Right lower leg: No edema.     Left lower leg: No edema.  Neurological:      General: No focal deficit present.     Motor: Tremor present.     Lab Results  Component Value Date   WBC 6.0 01/09/2023   HGB 13.1 01/09/2023   HCT 40.9 01/09/2023   PLT 315.0 01/09/2023   GLUCOSE 102 (H) 12/06/2022   CHOL 136 06/05/2022   TRIG 62.0 06/05/2022   HDL 69.50 06/05/2022   LDLDIRECT 147.7 12/03/2007   LDLCALC 54 06/05/2022   ALT 26 01/09/2023   AST 48 (H) 01/09/2023   NA 140 12/06/2022   K 4.0 12/06/2022   CL 107 12/06/2022   CREATININE 1.29 12/06/2022   BUN 32 (H) 12/06/2022   CO2 23 12/06/2022   TSH 0.71 01/09/2023   PSA 1.35 06/05/2022   INR 1.1 (H) 04/24/2018   HGBA1C 5.5 02/20/2017    DG INJECT DIAG/THERA/INC NEEDLE/CATH/PLC EPI/CERV/THOR W/IMG  Result Date: 08/31/2022 CLINICAL DATA:  Cervical spondylosis without myelopathy. Excellent response to the previous injection. Recurrent neck and occasional shoulder pain, worse on the right. Repeat epidural injection requested at C7-T1. FLUOROSCOPY: Radiation Exposure Index (as provided by the fluoroscopic device): 1.50 mGy Kerma PROCEDURE: The procedure, risks, benefits, and alternatives were explained to the patient. Questions regarding the procedure were encouraged and answered. The patient understands and consents to the procedure. CERVICAL EPIDURAL INJECTION An interlaminar approach was performed on the right at C7-T1. A 3.5 inch 20 gauge epidural needle was advanced using loss-of-resistance technique. DIAGNOSTIC EPIDURAL INJECTION Injection of Isovue-M 300 shows a good epidural pattern with spread above and below the level of needle placement, primarily on the right. No vascular opacification is seen. THERAPEUTIC EPIDURAL INJECTION 1.5 ml of Kenalog 40 mixed with 2 ml of normal saline were then instilled. The procedure was well-tolerated, and the patient was discharged thirty minutes following the injection in good condition. IMPRESSION: Technically successful interlaminar epidural injection on the right at  C7-T1. Electronically Signed   By: Sebastian Ache M.D.   On: 08/31/2022 11:08    Assessment & Plan:  Gastroesophageal reflux disease without esophagitis -     CBC with Differential/Platelet; Future  Acquired hypothyroidism -     TSH; Future  Stage 3a chronic kidney disease (HCC) -     Urinalysis, Routine w reflex microscopic; Future  Hyperlipidemia with target LDL less than 130 -     Hepatic function panel; Future  Hypomagnesemia -     Magnesium; Future -     Magnesium Oxide; Take 1 capsule (400 mg total) by mouth in the morning and at bedtime.  Dispense: 90 capsule; Refill: 1  Lead exposure -     Lead, blood (adult age 17 yrs or greater); Future  Essential hypertension, benign -     EKG 12-Lead  Tremor -  Ambulatory referral to Neurology  Primary insomnia -     Zolpidem Tartrate; Take 1 tablet (10 mg total) by mouth at bedtime as needed. for sleep  Dispense: 90 tablet; Refill: 0  Abnormal electrocardiogram (ECG) (EKG) -     ECHOCARDIOGRAM COMPLETE; Future     Follow-up: Return in about 6 months (around 07/10/2023).  Sanda Linger, MD

## 2023-01-11 LAB — LEAD, BLOOD (ADULT >= 16 YRS): Lead: 12.8 ug/dL — ABNORMAL HIGH (ref <3.5)

## 2023-01-17 ENCOUNTER — Other Ambulatory Visit: Payer: Self-pay | Admitting: Family Medicine

## 2023-01-17 NOTE — Telephone Encounter (Signed)
Rx refill request approved per Dr. Corey's orders. 

## 2023-01-18 ENCOUNTER — Encounter: Payer: Self-pay | Admitting: Neurology

## 2023-01-25 ENCOUNTER — Other Ambulatory Visit: Payer: Self-pay

## 2023-01-25 ENCOUNTER — Ambulatory Visit: Payer: Medicare HMO | Attending: Psychology | Admitting: Physical Therapy

## 2023-01-25 DIAGNOSIS — M542 Cervicalgia: Secondary | ICD-10-CM | POA: Insufficient documentation

## 2023-01-25 DIAGNOSIS — R293 Abnormal posture: Secondary | ICD-10-CM | POA: Insufficient documentation

## 2023-01-25 DIAGNOSIS — M6281 Muscle weakness (generalized): Secondary | ICD-10-CM | POA: Insufficient documentation

## 2023-01-25 NOTE — Therapy (Signed)
OUTPATIENT PHYSICAL THERAPY CERVICAL EVALUATION   Patient Name: Brandon [name] MRN: [id number] DOB:1950/09/19, 72 y.o., male Today's Date: 01/25/2023  END OF SESSION:  PT End of Session - 01/25/23 1012     Visit Number 1    Number of Visits 16    Date for PT Re-Evaluation 03/22/23    Authorization Type [employer] MCR    PT Start Time 8841    PT Stop Time 1015    PT Time Calculation (min) 42 min    Activity Tolerance Patient limited by pain;Patient tolerated treatment well    Behavior During Therapy [facility] for tasks assessed/performed             Past Medical History:  Diagnosis Date   Allergy    mild   Aortic aneurysm (HCC)    small and watching. followed by his PCP Dr. Sanda Linger, 02/06/2022   GERD (gastroesophageal reflux disease)    Head and neck cancer    head and neck ca dx 03/2009   Hyperlipidemia    Hypertension    Hypothyroidism    oropharyngeal ca dx'd 03/2009   chemo/xrt comp 06/2009   Swallowing difficulty    due to his cancer in neck and throat sometimes has problems swalling pills and had problem intubation due to radion to neck are. 02/06/2022   Thyroid disease    Wears glasses    reading 02/06/2022   Past Surgical History:  Procedure Laterality Date   CATARACT EXTRACTION Right    had it a couple of months ago   02/06/2022   HERNIA REPAIR     inguinal   hip replacemnt left Left    Left eye     stmach surgery     repair peg tube site   TRANSURETHRAL RESECTION OF BLADDER TUMOR WITH MITOMYCIN-C Bilateral 02/10/2022   Procedure: TRANSURETHRAL RESECTION OF BLADDER TUMOR  AND  POST OP INSTILLATION OF GEMCITABINE;  Surgeon: Jerilee Field, MD;  Location: [facility];  Service: Urology;  Laterality: Bilateral;  1 HR FOR CASE   Patient Active Problem List   Diagnosis Date Noted   Stage 3a chronic kidney disease (HCC) 01/09/2023   Essential hypertension, benign 01/09/2023   Tremor 01/09/2023   Chronic idiopathic gout involving toe of  left foot without tophus 12/06/2022   Allergic rhinitis 04/22/2022   Need for prophylactic vaccination and inoculation against varicella 10/12/2021   Encounter for general adult medical examination with abnormal findings 04/13/2021   Degenerative disc disease, lumbar 03/14/2021   Ventral hernia without obstruction or gangrene 10/06/2020   Degenerative disc disease, cervical 09/18/2018   Thiamine deficiency 04/27/2018   Renal cyst, left 04/24/2018   Alcohol abuse, continuous 04/24/2018   Lead exposure 04/24/2018   Benign prostatic hyperplasia without lower urinary tract symptoms 04/23/2017   Hypomagnesemia 02/20/2017   Hyperlipidemia with target LDL less than 130 09/09/2014   Hypothyroidism 09/09/2014   Thoracic aortic aneurysm without rupture (HCC) 03/13/2014   Neuropathy due to chemotherapeutic drug (HCC) 09/08/2013   Insomnia 07/30/2013   CARCINOMA, SQUAMOUS CELL 04/28/2010   GERD 02/11/2007    PCP: Etta Grandchild, MD   REFERRING PROVIDER: Marikay Alar, PhD   REFERRING DIAG: [id number] (ICD-10-CM) - Other spondylosis with radiculopathy, cervical region   THERAPY DIAG:  Cervicalgia  Muscle weakness (generalized)  Abnormal posture  Rationale for Evaluation and Treatment: Rehabilitation  ONSET DATE:  Three years or more,  SUBJECTIVE:  SUBJECTIVE STATEMENT: I have had neck pain for several years.  I receive cortisone injections which  helped before but it no longer is effective.   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 Sometimes I have to pick up 100 pound windows by sliding it.  I routinely need to pick up 30 to 40 pounds in my work.  I have tried TPDN at another place and it really hurt.  I was told I can get that here  as well.  I am having tremors that I am seeing a neurologist on November 5.   Hand dominance: Right  PERTINENT HISTORY:  THA L, head/neck oropharyngeal CA remission 13 years, HTN, HBP, Hypothyroidism, back surgery 2024 January for L-4/L5  PAIN:  Are you having pain? Yes: NPRS scale: 1/10 but at worst it is 12/18/08 Pain location: bil UT/ neck in midline Pain description: achy pain Aggravating factors: bending over and lifting items, turning head to drive especially to Left, lifting over my head and constantly bending over.  Relieving factors: sitting heat  PRECAUTIONS: None  RED FLAGS: None     WEIGHT BEARING RESTRICTIONS: No  FALLS:  Has patient fallen in last 6 months? 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   PLOF: Independent  PATIENT GOALS: Neck decrease pain and sometimes limited turning.   NEXT MD VISIT: TBD  OBJECTIVE:  Note: Objective measures were completed at Evaluation unless otherwise noted.  DIAGNOSTIC FINDINGS:  Nothing recent  PATIENT SURVEYS:  FOTO 50%  62 predicted  COGNITION: Overall cognitive status: Within functional limits for tasks assessed  SENSATION: Tremors with intention   appt with neurosurgeon  POSTURE: rounded shoulders, forward head, and flexed trunk   PALPATION: TTP over cervical paraspinals  Left > Right   CERVICAL ROM:   Active ROM A/PROM (deg) eval  Flexion 40  Extension 28  Right lateral flexion 20  Left lateral flexion 12  Right rotation 40  Left rotation 30   (Blank rows = not tested)  UPPER EXTREMITY ROM:  Active ROM Right eval Left eval  Shoulder flexion 152/ P 160 142/P 160  Shoulder extension    Shoulder abduction    Shoulder  adduction    Shoulder extension    Shoulder internal rotation Reach to  T-11 Reach to L-1   Shoulder external rotation Finger tip to C7 Finger tip to C-7  Elbow flexion    Elbow extension    Wrist flexion    Wrist extension    Wrist ulnar deviation    Wrist radial deviation    Wrist pronation    Wrist supination     (Blank rows = not tested)  UPPER EXTREMITY MMT:   grossly 4/5 in bil UE  MMT Right eval Left eval  Shoulder flexion    Shoulder extension    Shoulder abduction    Shoulder adduction    Shoulder extension    Shoulder internal rotation    Shoulder external rotation    Middle trapezius    Lower trapezius    Elbow flexion    Elbow extension    Wrist flexion    Wrist extension  Wrist ulnar deviation    Wrist radial deviation    Wrist pronation    Wrist supination    Grip strength 68.33 lb 78.33 lb   (Blank rows = not tested)   FUNCTIONAL TESTS:  5 times sit to stand: 10.67 sec DNF  15 sec from (37 sec norm)  TODAY'S TREATMENT:                                                                                                                              DATE: eval and issue HEP   PATIENT EDUCATION:  Education details: POC Explanation of findings, issue HEP  went over [employer] Person educated: Patient Education method: Explanation, Demonstration, Tactile cues, Verbal cues, and Handouts Education comprehension: verbalized understanding, returned demonstration, verbal cues required, tactile cues required, and needs further education  HOME EXERCISE PROGRAM: Access Code: [email or web address] URL: [email or web address] Date: 01/25/2023 Prepared by: Garen Lah  Exercises - Shoulder External Rotation and Scapular Retraction with Resistance  - 2 x daily - 7 x weekly - 3 sets - 10 reps - Scapular Retraction with Resistance  - 2 x daily - 7 x weekly - 3 sets - 10 reps - Shoulder Extension with Resistance - Palms Forward  - 1 x daily - 7 x weekly - 3 sets -  10 reps - Seated Scapular Retraction  - 1 x daily - 7 x weekly - 3 sets - 10 reps - Supine Cervical Retraction with Towel  - 1 x daily - 7 x weekly - 1 sets - 10 reps - Seated Levator Scapulae Stretch  - 1 x daily - 7 x weekly - 1 sets - 3 reps - 30 hold - Seated Cervical Sidebending Stretch  - 1 x daily - 7 x weekly - 1 sets - 3 reps - 30 hold - Doorway Pec Stretch at 90 Degrees Abduction  - 1 x daily - 7 x weekly - 3 sets - 10 reps  ASSESSMENT:  CLINICAL IMPRESSION: Patient is a 72 y.o. male who was seen today for physical therapy evaluation and treatment for cervical spondylosis and pain central to cervical spine but radiating into bil neck.  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   OBJECTIVE IMPAIRMENTS: decreased ROM, decreased strength, impaired flexibility, impaired UE functional use, postural dysfunction, and pain.   ACTIVITY LIMITATIONS: carrying, lifting, bending, sleeping, reach over head, and carrying heavy items for work  PARTICIPATION LIMITATIONS: driving, community activity, and occupation  PERSONAL FACTORS: THA L, head/neck oropharyngeal CA remission 13 years, HTN, HBP, Hypothyroidism, back surgery 2024 January for L-4/L5 are also affecting patient's functional outcome.   REHAB POTENTIAL: Good  CLINICAL DECISION MAKING: Evolving/moderate complexity  EVALUATION COMPLEXITY: Moderate   GOALS: Goals reviewed with patient? No  SHORT TERM GOALS: Target date: 02-22-23  Pt will be independent with initial HEP Baseline: limited knowledge Goal status: INITIAL  2.  Pt pain with functional activities  reduced to 5/10 Baseline: 10/10 with carrying stained glass pieces 35 to 40 # Goal status: INITIAL  3.  Demonstrate understanding of proper sitting posture, body mechanics, work ergonomics, and be more conscious of position and posture throughout the day.  Baseline: needs education Goal status: INITIAL   LONG TERM GOALS: Target date:  03-22-23  Pt will be independent with advanced HEP  Baseline: limited knowledge Goal status: INITIAL  2.  Demonstrate and verbalize techniques to reduce the risk of re-injury including: lifting, posture, body mechanics especially for work items like stained glass art Baseline: difficulty carrying without increasing pain to 10/10 Goal status: INITIAL  3.  Improved cervical rotation or lumbar compenstation to allow checking blind spots while driving with minimal discomfort Baseline: Pt with limited left cervical rotation  Goal status: INITIAL  4.  Pt will be able to lift at least 35 # without exacerbating pain in neck using good body mechanics Baseline: Pt avoiding lifting due to 10/10 pain Goal status: INITIAL  5.  FOTO will improve from 50%   to  62%   indicating improved functional mobility.  Baseline: 50% Goal status: INITIAL  6.  Pain will decrease to 3/10 with all functional activities and pt will be able to verbalize pain management strategies to implement Baseline: 10/10 Goal status: INITIAL  7. Pt will be able to perform DNF supine test and improve to at least 30 sec to show increased DNF strength  Baseline 15 sec  Goal status: INITIAL   PLAN:  PT FREQUENCY: 1-2x/week  PT DURATION: 8 weeks  PLANNED INTERVENTIONS: 97164- PT Re-evaluation, 97110-Therapeutic exercises, 97530- Therapeutic activity, 97112- Neuromuscular re-education, 97535- Self Care, 40981- Manual therapy, 19147- Electrical stimulation (manual), Patient/Family education, Dry Needling, Joint mobilization, Spinal mobilization, Cryotherapy, and Moist heat  PLAN FOR NEXT SESSION: TPDN and manual and progressive strengthening  Garen Lah, PT, ATRIC Certified Exercise Expert for the Aging Adult  01/25/23 1:04 PM Phone: [phone] Fax: [phone]

## 2023-02-02 ENCOUNTER — Other Ambulatory Visit: Payer: Self-pay | Admitting: Internal Medicine

## 2023-02-02 DIAGNOSIS — I10 Essential (primary) hypertension: Secondary | ICD-10-CM

## 2023-02-08 NOTE — Progress Notes (Signed)
Assessment/Plan:   1.  Tremor  -discussed nature and pathophysiology  -Reassurance that no evidence of Parkinsons disease.  -Long discussion with the patient regarding the complex effect that alcohol can have on tremor.  Chronic alcohol use can produce a tremor but discontinuation of the alcohol can also cause a tremulous state for quite some time.    -Would not recommend beta-blocker given low pulse/low blood pressure, which is likely due in part to current antihypertensive therapy.  -Patient would like to start primidone, 50 mg and work to 50 mg twice daily.  R/B/SE were discussed.  The opportunity to ask questions was given and they were answered to the best of my ability.  The patient expressed understanding and willingness to follow the outlined treatment protocols.   2.  Neck pain  -seeing neurosx now  -told had to do PT first before MRI cervical spine, but anticipates having that soon  -following Dr. Yetta Barre with [facility]  3.  Propriospinal myoclonus  -Overall mild and really only in the lower extremities  -mri lumbar with and without due to the fact that he has had previous back surgery  -mri thoracic  -if above neg, will consider cervical and brain but neurosx is likely going to be scanning neck soon.  -can be due to gabapentin, but he is on a very low-dose and I suspect that this is not a significant factor.  -renal insuff could contribute  4.  Peripheral neuropathy  -Has evidence of this on his examination, although patient states not bothersome.  -Patient has had chemotherapy in the past.  Alcohol likely a contributor as well.  We talked about both of these things today.  -We will do lab work including b12, folate, spep/upep, b1, hgba1c  Subjective:   Brandon [name] was seen today in the movement disorders clinic for neurologic consultation at the request of Etta Grandchild, MD.  Pt with wife who supplements hx.  The consultation is for the evaluation of  tremor.  Outside records that were made available to me were reviewed.   Records from primary care indicate that tremor has been present for 2 years (but was mild then), although has only become more noticeable for the last 2 months.  The only med change that happened was the carvidilol was d/c and bisoprolol was added.    Separately from tremor, there is also a jerking in the feet and head but patient doesn't notice these things at all but wife notes those things.  He doesn't feel or see it.  Tremor: Yes.     When is it noted the most?  Activation; notes it most when doing something tedious but finds that he can cut glass and write without trouble but he has trouble with pouring water and screwing in screw.  It comes and goes.  It seems worse when the arm is above 90 degrees.    Fam hx of tremor?  No.  Affected by caffeine:  No.  Affected by alcohol:  No. (4 bourbons and coke/day)  Affected by stress:  No.  Affected by fatigue:  No.  Spills soup if on spoon:  No.  Spills glass of liquid if full:  No.  Affects ADL's (tying shoes, brushing teeth, etc):  No.  Other Specific Symptoms:  Voice: no change Postural symptoms:  No.  Falls?  No. Bradykinesia symptoms: mild trouble getting OOC (due to mild back trouble) Loss of smell:  No. Loss of taste:  Yes.   (  Due to hx of oropharyngeal CA) Difficulty Swallowing:  Yes.   Due to hx of CA Depression:  No. Memory changes:  No. N/V:  No. Lightheaded:  No.  Syncope: No. Diplopia:  No.  Neuroimaging of the brain has not previously been performed.    PREVIOUS MEDICATIONS: none to date  ALLERGIES:   Allergies  Allergen Reactions   Benicar [Olmesartan]     fatigue   Amlodipine Swelling    CURRENT MEDICATIONS:  Current Outpatient Medications  Medication Instructions   allopurinol (ZYLOPRIM) 300 mg, Oral, Daily   aspirin 81 mg, Daily   bisoprolol (ZEBETA) 5 mg, Oral, Daily   candesartan (ATACAND) 32 mg, Oral, Daily   colchicine 0.6 mg,  Oral, Daily PRN   gabapentin (NEURONTIN) 300 MG capsule TAKE 1 CAPSULE BY MOUTH EVERYDAY AT BEDTIME   Magnesium Oxide 400 mg, Oral, 2 times daily   omeprazole (PRILOSEC) 40 MG capsule TAKE 1 CAPSULE BY MOUTH EVERY DAY   rosuvastatin (CRESTOR) 20 mg, Oral, Daily   Unithroid 100 mcg, Oral, Daily before breakfast   zolpidem (AMBIEN) 10 mg, Oral, At bedtime PRN, for sleep    Objective:   PHYSICAL EXAMINATION:    VITALS:   Vitals:   02/13/23 1017  BP: 117/77  Pulse: 62  SpO2: 99%  Weight: 183 lb 3.2 oz (83.1 kg)  Height: 5\' 10"  (1.778 m)    GEN:  The patient appears stated age and is in NAD. HEENT:  Normocephalic, atraumatic.  The mucous membranes are moist. The superficial temporal arteries are without ropiness or tenderness. CV:  RRR Lungs:  CTAB Neck/HEME:  There are no carotid bruits bilaterally.  Neurological examination:  Orientation: The patient is alert and oriented x3.  Cranial nerves: There is good facial symmetry.  Extraocular muscles are intact. The visual fields are full to confrontational testing. The speech is fluent and clear. Soft palate rises symmetrically and there is no tongue deviation. Hearing is intact to conversational tone. Sensation: Sensation is intact to light touch throughout (facial, trunk, extremities). Vibration is absent at the bilateral big toe and ankle but intact at the knee. There is no extinction with double simultaneous stimulation.  Motor: Strength is 5/5 in the bilateral upper and lower extremities.   Shoulder shrug is equal and symmetric.  There is no pronator drift. Deep tendon reflexes: Deep tendon reflexes are 2-2+/4 at the bilateral biceps, triceps, brachioradialis, patella and achilles. Plantar responses are downgoing bilaterally.  Movement examination: Tone: There is normal tone in the bilateral upper extremities.  The tone in the lower extremities is normal.  Abnormal movements: there is no rest tremor.  There is postural and  intention tremor, L>R.    There is no asterixis.  He has some mild probable propriospinal myoclonus independently in the lower extremities.  There is none in the arms.  He has trouble with archimedes spirals bilaterally.  He has tremor pouring water from one glass to another, but he really does not spill it, as he is able to control the tremor.  Coordination:  There is no decremation with RAM's, with any form of RAMS, including alternating supination and pronation of the forearm, hand opening and closing, finger taps, heel taps and toe taps.  Gait and Station: The patient has no difficulty arising out of a deep-seated chair without the use of the hands. The patient's stride length is good.     I have reviewed and interpreted the following labs independently   Chemistry  Component Value Date/Time   NA 140 12/06/2022 1055   NA 145 12/30/2014 1158   K 4.0 12/06/2022 1055   K 3.5 12/30/2014 1158   CL 107 12/06/2022 1055   CL 99 06/12/2014 0903   CO2 23 12/06/2022 1055   CO2 28 12/30/2014 1158   BUN 32 (H) 12/06/2022 1055   BUN 23.2 12/30/2014 1158   CREATININE 1.29 12/06/2022 1055   CREATININE 1.24 10/22/2019 1048   CREATININE 0.8 12/30/2014 1158      Component Value Date/Time   CALCIUM 9.4 12/06/2022 1055   CALCIUM 9.2 12/30/2014 1158   ALKPHOS 96 01/09/2023 1008   ALKPHOS 68 12/30/2014 1158   AST 48 (H) 01/09/2023 1008   AST 33 12/30/2014 1158   ALT 26 01/09/2023 1008   ALT 22 12/30/2014 1158   BILITOT 0.5 01/09/2023 1008   BILITOT 0.71 12/30/2014 1158      Lab Results  Component Value Date   TSH 0.71 01/09/2023   Lab Results  Component Value Date   WBC 6.0 01/09/2023   HGB 13.1 01/09/2023   HCT 40.9 01/09/2023   MCV 91.2 01/09/2023   PLT 315.0 01/09/2023   No results found for: "VITAMINB12"  Lab Results  Component Value Date   HGBA1C 5.5 02/20/2017     Total time spent on today's visit was  60 minutes, including both face-to-face time and nonface-to-face  time.  Time included that spent on review of records (prior notes available to me/labs/imaging if pertinent), discussing treatment and goals, answering patient's questions and coordinating care.  Cc:  Etta Grandchild, MD

## 2023-02-13 ENCOUNTER — Encounter: Payer: Self-pay | Admitting: Neurology

## 2023-02-13 ENCOUNTER — Ambulatory Visit: Payer: Medicare HMO | Admitting: Neurology

## 2023-02-13 ENCOUNTER — Other Ambulatory Visit: Payer: Self-pay

## 2023-02-13 ENCOUNTER — Other Ambulatory Visit (INDEPENDENT_AMBULATORY_CARE_PROVIDER_SITE_OTHER): Payer: Medicare HMO

## 2023-02-13 VITALS — BP 117/77 | HR 62 | Ht 70.0 in | Wt 183.2 lb

## 2023-02-13 DIAGNOSIS — M545 Low back pain, unspecified: Secondary | ICD-10-CM | POA: Diagnosis not present

## 2023-02-13 DIAGNOSIS — R739 Hyperglycemia, unspecified: Secondary | ICD-10-CM | POA: Diagnosis not present

## 2023-02-13 DIAGNOSIS — G620 Drug-induced polyneuropathy: Secondary | ICD-10-CM

## 2023-02-13 DIAGNOSIS — M546 Pain in thoracic spine: Secondary | ICD-10-CM

## 2023-02-13 DIAGNOSIS — G253 Myoclonus: Secondary | ICD-10-CM

## 2023-02-13 DIAGNOSIS — F1090 Alcohol use, unspecified, uncomplicated: Secondary | ICD-10-CM | POA: Diagnosis not present

## 2023-02-13 DIAGNOSIS — G8929 Other chronic pain: Secondary | ICD-10-CM

## 2023-02-13 LAB — HEMOGLOBIN A1C: Hgb A1c MFr Bld: 6 % (ref 4.6–6.5)

## 2023-02-13 LAB — B12 AND FOLATE PANEL
Folate: 9.2 ng/mL (ref >5.9)
Vitamin B-12: 171 pg/mL — ABNORMAL LOW (ref 211–911)

## 2023-02-13 MED ORDER — PRIMIDONE 50 MG PO TABS: 50.0000 mg | ORAL_TABLET | Freq: Two times a day (BID) | ORAL | 1 refills | Status: DC

## 2023-02-13 NOTE — Addendum Note (Signed)
Addended by: Bernerd Pho I on: 02/13/2023 11:33 AM   Modules accepted: Orders

## 2023-02-13 NOTE — Patient Instructions (Addendum)
Start primidone 50 mg - 1/2 tablet at bedtime for 1 week and then increase to 1 tablet at bedtime x 1 week and then 1 tablet twice per day  A referral to [facility] has been placed for your MRI someone will contact you directly to schedule your appt. They are located at [address] [facility]. Please contact them directly by calling [phone] with any questions regarding your referral.  Your provider has requested that you have labwork completed today. The lab is located on the Second floor at [address], within the [facility] office. When you get off the elevator, turn right and go in the [facility] [address]; the first brown door on the left.  Tell the ladies behind the desk that you are there for lab work. If you are not called within 15 minutes please check with the front desk.   Once you complete your labs you are free to go. You will receive a call or message via MyChart with your lab results.

## 2023-02-15 ENCOUNTER — Encounter: Payer: Self-pay | Admitting: Physical Therapy

## 2023-02-15 ENCOUNTER — Ambulatory Visit: Payer: Medicare HMO | Attending: Psychology | Admitting: Physical Therapy

## 2023-02-15 ENCOUNTER — Ambulatory Visit (INDEPENDENT_AMBULATORY_CARE_PROVIDER_SITE_OTHER): Payer: Medicare HMO

## 2023-02-15 DIAGNOSIS — R293 Abnormal posture: Secondary | ICD-10-CM | POA: Insufficient documentation

## 2023-02-15 DIAGNOSIS — M6281 Muscle weakness (generalized): Secondary | ICD-10-CM | POA: Diagnosis not present

## 2023-02-15 DIAGNOSIS — R7989 Other specified abnormal findings of blood chemistry: Secondary | ICD-10-CM | POA: Diagnosis not present

## 2023-02-15 DIAGNOSIS — M542 Cervicalgia: Secondary | ICD-10-CM | POA: Insufficient documentation

## 2023-02-15 MED ORDER — CYANOCOBALAMIN 1000 MCG/ML IJ SOLN
1000.0000 ug | Freq: Once | INTRAMUSCULAR | Status: AC
  Administered 2023-02-15: 1000 ug via INTRAMUSCULAR

## 2023-02-15 NOTE — Therapy (Signed)
OUTPATIENT PHYSICAL THERAPY CERVICAL TREATMENT   Patient Name: Brandon [name] MRN: [id number] DOB:February 13, 1951, 72 y.o., male Today's Date: 02/15/2023  END OF SESSION:  PT End of Session - 02/15/23 1315     Visit Number 2    Number of Visits 16    Date for PT Re-Evaluation 03/22/23    Authorization Type [employer] MCR    PT Start Time 1315    PT Stop Time 1355    PT Time Calculation (min) 40 min             Past Medical History:  Diagnosis Date   Allergy    mild   Aortic aneurysm (HCC)    small and watching. followed by his PCP Dr. Sanda Linger, 02/06/2022   GERD (gastroesophageal reflux disease)    Head and neck cancer    head and neck ca dx 03/2009   Hyperlipidemia    Hypertension    Hypothyroidism    oropharyngeal ca dx'd 03/2009   chemo/xrt comp 06/2009   Swallowing difficulty    due to his cancer in neck and throat sometimes has problems swalling pills and had problem intubation due to radion to neck are. 02/06/2022   Thyroid disease    Wears glasses    reading 02/06/2022   Past Surgical History:  Procedure Laterality Date   CATARACT EXTRACTION Right    had it a couple of months ago   02/06/2022   HERNIA REPAIR     inguinal   hip replacemnt left Left    Left eye     LUMBAR LAMINECTOMY     stmach surgery     repair peg tube site   TRANSURETHRAL RESECTION OF BLADDER TUMOR WITH MITOMYCIN-C Bilateral 02/10/2022   Procedure: TRANSURETHRAL RESECTION OF BLADDER TUMOR  AND  POST OP INSTILLATION OF GEMCITABINE;  Surgeon: Jerilee Field, MD;  Location: [facility];  Service: Urology;  Laterality: Bilateral;  1 HR FOR CASE   Patient Active Problem List   Diagnosis Date Noted   Stage 3a chronic kidney disease (HCC) 01/09/2023   Essential hypertension, benign 01/09/2023   Tremor 01/09/2023   Chronic idiopathic gout involving toe of left foot without tophus 12/06/2022   Allergic rhinitis 04/22/2022   Need for prophylactic vaccination and inoculation  against varicella 10/12/2021   Encounter for general adult medical examination with abnormal findings 04/13/2021   Degenerative disc disease, lumbar 03/14/2021   Ventral hernia without obstruction or gangrene 10/06/2020   Degenerative disc disease, cervical 09/18/2018   Thiamine deficiency 04/27/2018   Renal cyst, left 04/24/2018   Alcohol abuse, continuous 04/24/2018   Lead exposure 04/24/2018   Benign prostatic hyperplasia without lower urinary tract symptoms 04/23/2017   Hypomagnesemia 02/20/2017   Hyperlipidemia with target LDL less than 130 09/09/2014   Hypothyroidism 09/09/2014   Thoracic aortic aneurysm without rupture (HCC) 03/13/2014   Neuropathy due to chemotherapeutic drug (HCC) 09/08/2013   Insomnia 07/30/2013   CARCINOMA, SQUAMOUS CELL 04/28/2010   GERD 02/11/2007    PCP: Etta Grandchild, MD   REFERRING PROVIDER: Marikay Alar, PhD   REFERRING DIAG: [phone] (ICD-10-CM) - Other spondylosis with radiculopathy, cervical region   THERAPY DIAG:  Cervicalgia  Muscle weakness (generalized)  Abnormal posture  Rationale for Evaluation and Treatment: Rehabilitation  ONSET DATE:  Three years or more,  SUBJECTIVE:  SUBJECTIVE STATEMENT: The pain is not bad today despite unloading 2000 pounds of glass.  I use my arms a lot. I don't do the exercises except the stretches.   EVAL: I have had neck pain for several years.  I receive cortisone injections which  helped before but it no longer is effective.   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 Sometimes I have to pick up 100 pound windows by sliding it.  I routinely need to pick up 30 to 40 pounds in my work.  I have tried TPDN at another place and it really hurt.  I was  told I can get that here as well.  I am having tremors that I am seeing a neurologist on November 5.   Hand dominance: Right  PERTINENT HISTORY:  THA L, head/neck oropharyngeal CA remission 13 years, HTN, HBP, Hypothyroidism, back surgery 2024 January for L-4/L5  PAIN:  Are you having pain? Yes: NPRS scale: 0/10 but at worst it is 12/18/08 Pain location: bil UT/ neck in midline Pain description: achy pain Aggravating factors: bending over and lifting items, turning head to drive especially to Left, lifting over my head and constantly bending over.  Relieving factors: sitting heat  PRECAUTIONS: None  RED FLAGS: None     WEIGHT BEARING RESTRICTIONS: No  FALLS:  Has patient fallen in last 6 months? 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   PLOF: Independent  PATIENT GOALS: Neck decrease pain and sometimes limited turning.   NEXT MD VISIT: TBD  OBJECTIVE:  Note: Objective measures were completed at Evaluation unless otherwise noted.  DIAGNOSTIC FINDINGS:  Nothing recent  PATIENT SURVEYS:  FOTO 50%  62 predicted  COGNITION: Overall cognitive status: Within functional limits for tasks assessed  SENSATION: Tremors with intention   appt with neurosurgeon  POSTURE: rounded shoulders, forward head, and flexed trunk   PALPATION: TTP over cervical paraspinals  Left > Right   CERVICAL ROM:   Active ROM A/PROM (deg) eval  Flexion 40  Extension 28  Right lateral flexion 20  Left lateral flexion 12  Right rotation 40  Left rotation 30   (Blank rows = not tested)  UPPER EXTREMITY ROM:  Active ROM Right eval Left eval  Shoulder flexion 152/ P 160 142/P 160  Shoulder extension    Shoulder  abduction    Shoulder adduction    Shoulder extension    Shoulder internal rotation Reach to  T-11 Reach to L-1   Shoulder external rotation Finger tip to C7 Finger tip to C-7  Elbow flexion    Elbow extension    Wrist flexion    Wrist extension    Wrist ulnar deviation    Wrist radial deviation    Wrist pronation    Wrist supination     (Blank rows = not tested)  UPPER EXTREMITY MMT:   grossly 4/5 in bil UE  MMT Right eval Left eval  Shoulder flexion    Shoulder extension    Shoulder abduction    Shoulder adduction    Shoulder extension    Shoulder internal rotation    Shoulder external rotation  Middle trapezius    Lower trapezius    Elbow flexion    Elbow extension    Wrist flexion    Wrist extension    Wrist ulnar deviation    Wrist radial deviation    Wrist pronation    Wrist supination    Grip strength 68.33 lb 78.33 lb   (Blank rows = not tested)   FUNCTIONAL TESTS:  5 times sit to stand: 10.67 sec DNF  15 sec from (37 sec norm)  TODAY'S TREATMENT:                                                                                                                              [facility] Adult PT Treatment:                                                DATE: 02/15/23 Therapeutic Exercise: SUPINE:15# bilat UE , tricep press 2 x 10, chest press 2 x 10 Stanidng Shoulder Rows GTB 10 x 2  Standing Shoulder EXT GTB 10 x 2  Standing GTB alternating diagonals to fatigue Standing GTB scap retract with Bil ER 10 x 2  Manual Therapy: STW to bil UT, BIL LS, T-2 to T-8,  C3- C-7  PA mobs C-6 to T-8. Wayland Denis [place], PT) Lateral glides C2- C-4 Wayland Denis [place], [place]) Gentle cervical distraction Garen Lah, PT) Trigger Point Dry Needling Treatment: Garen Lah, PT)  Pre-treatment instruction: Patient instructed on dry needling rationale, procedures, and possible side effects including pain during treatment (achy,cramping feeling), bruising, drop of blood,  lightheadedness, nausea, sweating. Patient Consent Given: Yes Education handout provided: Previously provided Muscles treated: bil UT, BIL LS, T-2 to T-8,  C3- C-7  Needle size and number: .30x21mm x 3 Electrical stimulation performed: No Parameters: N/A Treatment response/outcome: Twitch response elicited and Palpable decrease in muscle tension Post-treatment instructions: Patient instructed to expect possible mild to moderate muscle soreness later today and/or tomorrow. Patient instructed in methods to reduce muscle soreness and to continue prescribed HEP. If patient was dry needled over the lung field, patient was instructed on signs and symptoms of pneumothorax and, however unlikely, to see immediate medical attention should they occur. Patient was also educated on signs and symptoms of infection and to seek medical attention should they occur. Patient verbalized understanding of these instructions and education.     DATE: eval and issue HEP   PATIENT EDUCATION:  Education details: POC Explanation of findings, issue HEP  went over [place] Person educated: Patient Education method: Explanation, Demonstration, Tactile cues, Verbal cues, and Handouts Education comprehension: verbalized understanding, returned demonstration, verbal cues required, tactile cues required, and needs further education  HOME EXERCISE PROGRAM: Access Code: [email or web address] URL: [email or web address] Date: 01/25/2023 Prepared by: Garen Lah  Exercises - Shoulder External Rotation and Scapular Retraction with Resistance  -  2 x daily - 7 x weekly - 3 sets - 10 reps - Scapular Retraction with Resistance  - 2 x daily - 7 x weekly - 3 sets - 10 reps - Shoulder Extension with Resistance - Palms Forward  - 1 x daily - 7 x weekly - 3 sets - 10 reps - Seated Scapular Retraction  - 1 x daily - 7 x weekly - 3 sets - 10 reps - Supine Cervical Retraction with Towel  - 1 x daily - 7 x weekly - 1 sets - 10 reps -  Seated Levator Scapulae Stretch  - 1 x daily - 7 x weekly - 1 sets - 3 reps - 30 hold - Seated Cervical Sidebending Stretch  - 1 x daily - 7 x weekly - 1 sets - 3 reps - 30 hold - Doorway Pec Stretch at 90 Degrees Abduction  - 1 x daily - 7 x weekly - 3 sets - 10 reps  ASSESSMENT:  CLINICAL IMPRESSION: Pt reports min compliance with HEP, only performing stretches. He is active with lifting and using arms daily. Needs to lift 30# pound pieces of glass so worked on 2, 15# dumbell chest press and tricep press. He felt weaker with chest press position. Reviewed HEP for scapular strength and supine cervical stab, progressed with standing diagonals. Pt received TPDN and manual therapy from Garen Lah, PT and reported feeling no increased pain after session.    EVAL: Patient is a 72 y.o. male who was seen today for physical therapy evaluation and treatment for cervical spondylosis and pain central to cervical spine but radiating into bil neck.  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   OBJECTIVE IMPAIRMENTS: decreased ROM, decreased strength, impaired flexibility, impaired UE functional use, postural dysfunction, and pain.   ACTIVITY LIMITATIONS: carrying, lifting, bending, sleeping, reach over head, and carrying heavy items for work  PARTICIPATION LIMITATIONS: driving, community activity, and occupation  PERSONAL FACTORS: THA L, head/neck oropharyngeal CA remission 13 years, HTN, HBP, Hypothyroidism, back surgery 2024 January for L-4/L5 are also affecting patient's functional outcome.   REHAB POTENTIAL: Good  CLINICAL DECISION MAKING: Evolving/moderate complexity  EVALUATION COMPLEXITY: Moderate   GOALS: Goals reviewed with patient? No  SHORT TERM GOALS: Target date: 02-22-23  Pt will be independent with initial HEP Baseline: limited knowledge Goal status: INITIAL  2.  Pt pain with functional activities reduced to 5/10 Baseline: 10/10 with  carrying stained glass pieces 35 to 40 # Goal status: INITIAL  3.  Demonstrate understanding of proper sitting posture, body mechanics, work ergonomics, and be more conscious of position and posture throughout the day.  Baseline: needs education Goal status: INITIAL   LONG TERM GOALS: Target date: 03-22-23  Pt will be independent with advanced HEP  Baseline: limited knowledge Goal status: INITIAL  2.  Demonstrate and verbalize techniques to reduce the risk of re-injury including: lifting, posture, body mechanics especially for work items like stained glass art Baseline: difficulty carrying without increasing pain to 10/10 Goal status: INITIAL  3.  Improved cervical rotation or lumbar compenstation to allow checking blind spots while driving with minimal discomfort Baseline: Pt with limited left cervical rotation  Goal status: INITIAL  4.  Pt will be able to lift at least 35 # without exacerbating pain in neck using good body mechanics Baseline: Pt avoiding lifting due to 10/10 pain Goal status: INITIAL  5.  FOTO will improve from 50%   to  62%   indicating improved functional mobility.  Baseline: 50% Goal status: INITIAL  6.  Pain will decrease to 3/10 with all functional activities and pt will be able to verbalize pain management strategies to implement Baseline: 10/10 Goal status: INITIAL  7. Pt will be able to perform DNF supine test and improve to at least 30 sec to show increased DNF strength  Baseline 15 sec  Goal status: INITIAL   PLAN:  PT FREQUENCY: 1-2x/week  PT DURATION: 8 weeks  PLANNED INTERVENTIONS: 97164- PT Re-evaluation, 97110-Therapeutic exercises, 97530- Therapeutic activity, 97112- Neuromuscular re-education, 97535- Self Care, 62952- Manual therapy, 97032- Electrical stimulation (manual), Patient/Family education, Dry Needling, Joint mobilization, Spinal mobilization, Cryotherapy, and Moist heat  PLAN FOR NEXT SESSION: TPDN and manual and progressive  strengthening  Jannette Spanner, PTA 02/15/23 2:06 PM Phone: [phone] Fax: [phone]

## 2023-02-15 NOTE — Progress Notes (Addendum)
Patient presented to the office today for a B12 injection following labs on 02/13/23 with a a low b12 level  Per Verbal orders from Dr Yetta Barre, pt is to take monthly injections then repeat levels at next check in

## 2023-02-16 ENCOUNTER — Other Ambulatory Visit: Payer: Self-pay | Admitting: Internal Medicine

## 2023-02-16 DIAGNOSIS — E785 Hyperlipidemia, unspecified: Secondary | ICD-10-CM

## 2023-02-16 LAB — PROTEIN ELECTRO, RANDOM URINE
Albumin ELP, Urine: 25 %
Alpha-1-Globulin, U: 2.1 %
Alpha-2-Globulin, U: 16.7 %
Beta Globulin, U: 41.5 %
Gamma Globulin, U: 14.8 %
Protein, Ur: 23.5 mg/dL

## 2023-02-16 LAB — IMMUNOFIXATION, URINE

## 2023-02-18 LAB — PROTEIN ELECTROPHORESIS, SERUM
Albumin ELP: 3.9 g/dL (ref 3.8–4.8)
Alpha 1: 0.3 g/dL (ref 0.2–0.3)
Alpha 2: 0.7 g/dL (ref 0.5–0.9)
Beta 2: 0.4 g/dL (ref 0.2–0.5)
Beta Globulin: 0.5 g/dL (ref 0.4–0.6)
Gamma Globulin: 0.8 g/dL (ref 0.8–1.7)
Total Protein: 6.6 g/dL (ref 6.1–8.1)

## 2023-02-18 LAB — VITAMIN B1: Vitamin B1 (Thiamine): 9 nmol/L (ref 8–30)

## 2023-02-19 ENCOUNTER — Ambulatory Visit (HOSPITAL_BASED_OUTPATIENT_CLINIC_OR_DEPARTMENT_OTHER): Payer: Medicare HMO

## 2023-02-19 ENCOUNTER — Ambulatory Visit: Payer: Medicare HMO | Admitting: Physical Therapy

## 2023-02-19 ENCOUNTER — Encounter: Payer: Self-pay | Admitting: Physical Therapy

## 2023-02-19 DIAGNOSIS — I517 Cardiomegaly: Secondary | ICD-10-CM

## 2023-02-19 DIAGNOSIS — M6281 Muscle weakness (generalized): Secondary | ICD-10-CM | POA: Diagnosis not present

## 2023-02-19 DIAGNOSIS — M542 Cervicalgia: Secondary | ICD-10-CM

## 2023-02-19 DIAGNOSIS — I7121 Aneurysm of the ascending aorta, without rupture: Secondary | ICD-10-CM

## 2023-02-19 DIAGNOSIS — R293 Abnormal posture: Secondary | ICD-10-CM | POA: Diagnosis not present

## 2023-02-19 DIAGNOSIS — R9431 Abnormal electrocardiogram [ECG] [EKG]: Secondary | ICD-10-CM

## 2023-02-19 NOTE — Therapy (Signed)
OUTPATIENT PHYSICAL THERAPY CERVICAL TREATMENT   Patient Name: Brandon [name] MRN: [id number] DOB:1950-12-10, 72 y.o., male Today's Date: 02/19/2023  END OF SESSION:  PT End of Session - 02/19/23 1145     Visit Number 3    Number of Visits 16    Date for PT Re-Evaluation 03/22/23    Authorization Type [employer] MCR    PT Start Time 1145    PT Stop Time 1225    PT Time Calculation (min) 40 min             Past Medical History:  Diagnosis Date   Allergy    mild   Aortic aneurysm (HCC)    small and watching. followed by his PCP Dr. Sanda Linger, 02/06/2022   GERD (gastroesophageal reflux disease)    Head and neck cancer    head and neck ca dx 03/2009   Hyperlipidemia    Hypertension    Hypothyroidism    oropharyngeal ca dx'd 03/2009   chemo/xrt comp 06/2009   Swallowing difficulty    due to his cancer in neck and throat sometimes has problems swalling pills and had problem intubation due to radion to neck are. 02/06/2022   Thyroid disease    Wears glasses    reading 02/06/2022   Past Surgical History:  Procedure Laterality Date   CATARACT EXTRACTION Right    had it a couple of months ago   02/06/2022   HERNIA REPAIR     inguinal   hip replacemnt left Left    Left eye     LUMBAR LAMINECTOMY     stmach surgery     repair peg tube site   TRANSURETHRAL RESECTION OF BLADDER TUMOR WITH MITOMYCIN-C Bilateral 02/10/2022   Procedure: TRANSURETHRAL RESECTION OF BLADDER TUMOR  AND  POST OP INSTILLATION OF GEMCITABINE;  Surgeon: Jerilee Field, MD;  Location: [facility];  Service: Urology;  Laterality: Bilateral;  1 HR FOR CASE   Patient Active Problem List   Diagnosis Date Noted   Stage 3a chronic kidney disease (HCC) 01/09/2023   Essential hypertension, benign 01/09/2023   Tremor 01/09/2023   Chronic idiopathic gout involving toe of left foot without tophus 12/06/2022   Allergic rhinitis 04/22/2022   Need for prophylactic vaccination and inoculation  against varicella 10/12/2021   Encounter for general adult medical examination with abnormal findings 04/13/2021   Degenerative disc disease, lumbar 03/14/2021   Ventral hernia without obstruction or gangrene 10/06/2020   Degenerative disc disease, cervical 09/18/2018   Thiamine deficiency 04/27/2018   Renal cyst, left 04/24/2018   Alcohol abuse, continuous 04/24/2018   Lead exposure 04/24/2018   Benign prostatic hyperplasia without lower urinary tract symptoms 04/23/2017   Hypomagnesemia 02/20/2017   Hyperlipidemia with target LDL less than 130 09/09/2014   Hypothyroidism 09/09/2014   Thoracic aortic aneurysm without rupture (HCC) 03/13/2014   Neuropathy due to chemotherapeutic drug (HCC) 09/08/2013   Insomnia 07/30/2013   CARCINOMA, SQUAMOUS CELL 04/28/2010   GERD 02/11/2007    PCP: Etta Grandchild, MD   REFERRING PROVIDER: Marikay Alar, PhD   REFERRING DIAG: [phone] (ICD-10-CM) - Other spondylosis with radiculopathy, cervical region   THERAPY DIAG:  Cervicalgia  Muscle weakness (generalized)  Abnormal posture  Rationale for Evaluation and Treatment: Rehabilitation  ONSET DATE:  Three years or more,  SUBJECTIVE:  SUBJECTIVE STATEMENT: Lumbar pain has been increased the last 2 weeks, 4/10 LBP.  2/10 stiffness in the neck today.    EVAL: I have had neck pain for several years.  I receive cortisone injections which  helped before but it no longer is effective.   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 Sometimes I have to pick up 100 pound windows by sliding it.  I routinely need to pick up 30 to 40 pounds in my work.  I have tried TPDN at another place and it really hurt.  I was told I can get that here as well.  I am  having tremors that I am seeing a neurologist on November 5.   Hand dominance: Right  PERTINENT HISTORY:  THA L, head/neck oropharyngeal CA remission 13 years, HTN, HBP, Hypothyroidism, back surgery 2024 January for L-4/L5  PAIN:  Are you having pain? Yes: NPRS scale: 2/10 but at worst it is 12/18/08 Pain location: bil UT/ neck in midline Pain description: achy pain Aggravating factors: bending over and lifting items, turning head to drive especially to Left, lifting over my head and constantly bending over.  Relieving factors: sitting heat  PRECAUTIONS: None  RED FLAGS: None     WEIGHT BEARING RESTRICTIONS: No  FALLS:  Has patient fallen in last 6 months? 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   PLOF: Independent  PATIENT GOALS: Neck decrease pain and sometimes limited turning.   NEXT MD VISIT: TBD  OBJECTIVE:  Note: Objective measures were completed at Evaluation unless otherwise noted.  DIAGNOSTIC FINDINGS:  Nothing recent  PATIENT SURVEYS:  FOTO 50%  62 predicted  COGNITION: Overall cognitive status: Within functional limits for tasks assessed  SENSATION: Tremors with intention   appt with neurosurgeon  POSTURE: rounded shoulders, forward head, and flexed trunk   PALPATION: TTP over cervical paraspinals  Left > Right   CERVICAL ROM:   Active ROM A/PROM (deg) eval  Flexion 40  Extension 28  Right lateral flexion 20  Left lateral flexion 12  Right rotation 40  Left rotation 30   (Blank rows = not tested)  UPPER EXTREMITY ROM:  Active ROM Right eval Left eval  Shoulder flexion 152/ P 160 142/P 160  Shoulder extension    Shoulder abduction    Shoulder adduction     Shoulder extension    Shoulder internal rotation Reach to  T-11 Reach to L-1   Shoulder external rotation Finger tip to C7 Finger tip to C-7  Elbow flexion    Elbow extension    Wrist flexion    Wrist extension    Wrist ulnar deviation    Wrist radial deviation    Wrist pronation    Wrist supination     (Blank rows = not tested)  UPPER EXTREMITY MMT:   grossly 4/5 in bil UE  MMT Right eval Left eval  Shoulder flexion    Shoulder extension    Shoulder abduction    Shoulder adduction    Shoulder extension    Shoulder internal rotation    Shoulder external rotation    Middle trapezius    Lower trapezius  Elbow flexion    Elbow extension    Wrist flexion    Wrist extension    Wrist ulnar deviation    Wrist radial deviation    Wrist pronation    Wrist supination    Grip strength 68.33 lb 78.33 lb   (Blank rows = not tested)   FUNCTIONAL TESTS:  5 times sit to stand: 10.67 sec DNF  15 sec from (37 sec norm)  TODAY'S TREATMENT:                                                                                                                              [facility] Adult PT Treatment:                                                DATE: 02/19/23 Therapeutic Exercise: UBE  L2 2 min each way  Standing ITY GTB 90/90 KB carry 5# progressed to 10# Seated tricep press 20# 2 x 10 Seated chest press 15# 2 x 10  Free motion ROW 26# total using Bil handles  Standing shoulder Ext Blue 10 x 2  GTB retract and ER 10 x 2   Therapeutic Activity: Hip hinge- bending at his tables, pacing and activity modifications    [facility] Adult PT Treatment:                                                DATE: 02/15/23 Therapeutic Exercise: SUPINE:15# bilat UE , tricep press 2 x 10, chest press 2 x 10 Stanidng Shoulder Rows GTB 10 x 2  Standing Shoulder EXT GTB 10 x 2  Standing GTB alternating diagonals to fatigue Standing GTB scap retract with Bil ER 10 x 2  Manual Therapy: STW to bil UT, BIL  LS, T-2 to T-8,  C3- C-7  PA mobs C-6 to T-8. Wayland Denis Munjor, PT) Lateral glides C2- C-4 Wayland Denis [place], [place]) Gentle cervical distraction Garen Lah, PT) Trigger Point Dry Needling Treatment: Garen Lah, PT)  Pre-treatment instruction: Patient instructed on dry needling rationale, procedures, and possible side effects including pain during treatment (achy,cramping feeling), bruising, drop of blood, lightheadedness, nausea, sweating. Patient Consent Given: Yes Education handout provided: Previously provided Muscles treated: bil UT, BIL LS, T-2 to T-8,  C3- C-7  Needle size and number: .30x9mm x 3 Electrical stimulation performed: No Parameters: N/A Treatment response/outcome: Twitch response elicited and Palpable decrease in muscle tension Post-treatment instructions: Patient instructed to expect possible mild to moderate muscle soreness later today and/or tomorrow. Patient instructed in methods to reduce muscle soreness and to continue prescribed HEP. If patient was dry needled over the lung field, patient was instructed on signs and symptoms of pneumothorax and,  however unlikely, to see immediate medical attention should they occur. Patient was also educated on signs and symptoms of infection and to seek medical attention should they occur. Patient verbalized understanding of these instructions and education.     DATE: eval and issue HEP   PATIENT EDUCATION:  Education details: POC Explanation of findings, issue HEP  went over [place] Person educated: Patient Education method: Explanation, Demonstration, Tactile cues, Verbal cues, and Handouts Education comprehension: verbalized understanding, returned demonstration, verbal cues required, tactile cues required, and needs further education  HOME EXERCISE PROGRAM: Access Code: [email or web address] URL: [email or web address] Date: 01/25/2023 Prepared by: Garen Lah  Exercises - Shoulder External Rotation and  Scapular Retraction with Resistance  - 2 x daily - 7 x weekly - 3 sets - 10 reps - Scapular Retraction with Resistance  - 2 x daily - 7 x weekly - 3 sets - 10 reps - Shoulder Extension with Resistance - Palms Forward  - 1 x daily - 7 x weekly - 3 sets - 10 reps - Seated Scapular Retraction  - 1 x daily - 7 x weekly - 3 sets - 10 reps - Supine Cervical Retraction with Towel  - 1 x daily - 7 x weekly - 1 sets - 10 reps - Seated Levator Scapulae Stretch  - 1 x daily - 7 x weekly - 1 sets - 3 reps - 30 hold - Seated Cervical Sidebending Stretch  - 1 x daily - 7 x weekly - 1 sets - 3 reps - 30 hold - Doorway Pec Stretch at 90 Degrees Abduction  - 1 x daily - 7 x weekly - 3 sets - 10 reps  ASSESSMENT:  CLINICAL IMPRESSION: Pt reports min compliance with HEP still, only performing stretches. He does believe TPDN helped some as he did not have much trouble lowering 20# pieces of glass repetitively over the weekend. Focus of session was for UE and scapular strengthening. He reports lower back is more bothersome than his neck over the last 2 weeks. Based on his job requirements could use some core strengthening. Instruction given in hip hinge for bending and stooping at his work tables. Pt verbalizes understanding of modifications, pacing and avoiding over doing it. Kyphotic posture limits cervical thoracic spine alignment with bending.    EVAL: Patient is a 72 y.o. male who was seen today for physical therapy evaluation and treatment for cervical spondylosis and pain central to cervical spine but radiating into bil neck.  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   OBJECTIVE IMPAIRMENTS: decreased ROM, decreased strength, impaired flexibility, impaired UE functional use, postural dysfunction, and pain.   ACTIVITY LIMITATIONS: carrying, lifting, bending, sleeping, reach over head, and carrying heavy items for work  PARTICIPATION LIMITATIONS: driving, community activity,  and occupation  PERSONAL FACTORS: THA L, head/neck oropharyngeal CA remission 13 years, HTN, HBP, Hypothyroidism, back surgery 2024 January for L-4/L5 are also affecting patient's functional outcome.   REHAB POTENTIAL: Good  CLINICAL DECISION MAKING: Evolving/moderate complexity  EVALUATION COMPLEXITY: Moderate   GOALS: Goals reviewed with patient? No  SHORT TERM GOALS: Target date: 02-22-23  Pt will be independent with initial HEP Baseline: limited knowledge Goal status: ONGOING   2.  Pt pain with functional activities reduced to 5/10 Baseline: 10/10 with carrying stained glass pieces 35 to 40 # Goal status: ONGOING  3.  Demonstrate understanding of proper sitting posture, body mechanics, work ergonomics, and be more conscious of position and posture throughout  the day.  Baseline: needs education 02/19/23: discussed hip hinge for work at his various table Goal status: ONGOING   LONG TERM GOALS: Target date: 03-22-23  Pt will be independent with advanced HEP  Baseline: limited knowledge Goal status: INITIAL  2.  Demonstrate and verbalize techniques to reduce the risk of re-injury including: lifting, posture, body mechanics especially for work items like stained glass art Baseline: difficulty carrying without increasing pain to 10/10 Goal status: INITIAL  3.  Improved cervical rotation or lumbar compenstation to allow checking blind spots while driving with minimal discomfort Baseline: Pt with limited left cervical rotation  Goal status: INITIAL  4.  Pt will be able to lift at least 35 # without exacerbating pain in neck using good body mechanics Baseline: Pt avoiding lifting due to 10/10 pain Goal status: INITIAL  5.  FOTO will improve from 50%   to  62%   indicating improved functional mobility.  Baseline: 50% Goal status: INITIAL  6.  Pain will decrease to 3/10 with all functional activities and pt will be able to verbalize pain management strategies to  implement Baseline: 10/10 Goal status: INITIAL  7. Pt will be able to perform DNF supine test and improve to at least 30 sec to show increased DNF strength  Baseline 15 sec  Goal status: INITIAL   PLAN:  PT FREQUENCY: 1-2x/week  PT DURATION: 8 weeks  PLANNED INTERVENTIONS: 97164- PT Re-evaluation, 97110-Therapeutic exercises, 97530- Therapeutic activity, 97112- Neuromuscular re-education, 97535- Self Care, 29562- Manual therapy, 97032- Electrical stimulation (manual), Patient/Family education, Dry Needling, Joint mobilization, Spinal mobilization, Cryotherapy, and Moist heat  PLAN FOR NEXT SESSION: TPDN and manual and progressive strengthening, core  Jannette Spanner, PTA 02/19/23 12:50 PM Phone: [phone] Fax: [phone]

## 2023-02-20 ENCOUNTER — Other Ambulatory Visit: Payer: Self-pay | Admitting: Internal Medicine

## 2023-02-20 DIAGNOSIS — I5032 Chronic diastolic (congestive) heart failure: Secondary | ICD-10-CM

## 2023-02-20 DIAGNOSIS — I5030 Unspecified diastolic (congestive) heart failure: Secondary | ICD-10-CM | POA: Insufficient documentation

## 2023-02-20 LAB — ECHOCARDIOGRAM COMPLETE
Area-P 1/2: 5.27 cm2
S' Lateral: 3.65 cm

## 2023-02-21 ENCOUNTER — Ambulatory Visit: Payer: Medicare HMO | Admitting: Physical Therapy

## 2023-02-21 DIAGNOSIS — M542 Cervicalgia: Secondary | ICD-10-CM | POA: Diagnosis not present

## 2023-02-21 DIAGNOSIS — M6281 Muscle weakness (generalized): Secondary | ICD-10-CM

## 2023-02-21 DIAGNOSIS — R293 Abnormal posture: Secondary | ICD-10-CM

## 2023-02-21 NOTE — Therapy (Signed)
OUTPATIENT PHYSICAL THERAPY CERVICAL TREATMENT   Patient Name: Brandon [name] MRN: [id number] DOB:[birth year], 72 y.o., male Today's Date: 02/21/2023  END OF SESSION:  PT End of Session - 02/21/23 1107     Visit Number 4    Number of Visits 16    Date for PT Re-Evaluation 03/22/23    Authorization Type [employer] MCR    PT Start Time 1109    PT Stop Time 1154    PT Time Calculation (min) 45 min    Activity Tolerance Patient tolerated treatment well    Behavior During Therapy WFL for tasks assessed/performed              Past Medical History:  Diagnosis Date   Allergy    mild   Aortic aneurysm (HCC)    small and watching. followed by his PCP Dr. Sanda Linger, 02/06/2022   GERD (gastroesophageal reflux disease)    Head and neck cancer    head and neck ca dx 03/2009   Hyperlipidemia    Hypertension    Hypothyroidism    oropharyngeal ca dx'd 03/2009   chemo/xrt comp 06/2009   Swallowing difficulty    due to his cancer in neck and throat sometimes has problems swalling pills and had problem intubation due to radion to neck are. 02/06/2022   Thyroid disease    Wears glasses    reading 02/06/2022   Past Surgical History:  Procedure Laterality Date   CATARACT EXTRACTION Right    had it a couple of months ago   02/06/2022   HERNIA REPAIR     inguinal   hip replacemnt left Left    Left eye     LUMBAR LAMINECTOMY     stmach surgery     repair peg tube site   TRANSURETHRAL RESECTION OF BLADDER TUMOR WITH MITOMYCIN-C Bilateral 02/10/2022   Procedure: TRANSURETHRAL RESECTION OF BLADDER TUMOR  AND  POST OP INSTILLATION OF GEMCITABINE;  Surgeon: Jerilee Field, MD;  Location: [facility];  Service: Urology;  Laterality: Bilateral;  1 HR FOR CASE   Patient Active Problem List   Diagnosis Date Noted   (HFpEF) heart failure with preserved ejection fraction (HCC) 02/20/2023   Stage 3a chronic kidney disease (HCC) 01/09/2023   Essential hypertension, benign  01/09/2023   Tremor 01/09/2023   Chronic idiopathic gout involving toe of left foot without tophus 12/06/2022   Allergic rhinitis 04/22/2022   Need for prophylactic vaccination and inoculation against varicella 10/12/2021   Encounter for general adult medical examination with abnormal findings 04/13/2021   Degenerative disc disease, lumbar 03/14/2021   Ventral hernia without obstruction or gangrene 10/06/2020   Degenerative disc disease, cervical 09/18/2018   Thiamine deficiency 04/27/2018   Renal cyst, left 04/24/2018   Alcohol abuse, continuous 04/24/2018   Lead exposure 04/24/2018   Benign prostatic hyperplasia without lower urinary tract symptoms 04/23/2017   Hypomagnesemia 02/20/2017   Hyperlipidemia with target LDL less than 130 09/09/2014   Hypothyroidism 09/09/2014   Thoracic aortic aneurysm without rupture (HCC) 03/13/2014   Neuropathy due to chemotherapeutic drug (HCC) 09/08/2013   Insomnia 07/30/2013   CARCINOMA, SQUAMOUS CELL 04/28/2010   GERD 02/11/2007    PCP: Etta Grandchild, MD   REFERRING PROVIDER: Marikay Alar, PhD   REFERRING DIAG: [phone] (ICD-10-CM) - Other spondylosis with radiculopathy, cervical region   THERAPY DIAG:  Cervicalgia  Muscle weakness (generalized)  Abnormal posture  Rationale for Evaluation and Treatment: Rehabilitation  ONSET DATE:  Three years or more,  SUBJECTIVE:                                                                                                                                                                                                         SUBJECTIVE STATEMENT: " I am doing pretty good in my neck, my back is giving me more issues usually when sitting in chair."   EVAL: I have had neck pain for several years.  I receive cortisone injections which  helped before but it no longer is effective.   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 Sometimes I have  to pick up 100 pound windows by sliding it.  I routinely need to pick up 30 to 40 pounds in my work.  I have tried TPDN at another place and it really hurt.  I was told I can get that here as well.  I am having tremors that I am seeing a neurologist on November 5.   Hand dominance: Right  PERTINENT HISTORY:  THA L, head/neck oropharyngeal CA remission 13 years, HTN, HBP, Hypothyroidism, back surgery 2024 January for L-4/L5  PAIN:  Are you having pain? Yes: NPRS scale: 1/10 in the neck, and the back 2-3/ 10 Pain location: bil UT/ neck in midline Pain description: achy pain Aggravating factors: bending over and lifting items, turning head to drive especially to Left, lifting over my head and constantly bending over.  Relieving factors: sitting heat  PRECAUTIONS: None  RED FLAGS: None     WEIGHT BEARING RESTRICTIONS: No  FALLS:  Has patient fallen in last 6 months? 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   PLOF: Independent  PATIENT GOALS: Neck decrease pain and sometimes limited turning.   NEXT MD VISIT: TBD  OBJECTIVE:  Note: Objective measures were completed at Evaluation unless otherwise noted.  DIAGNOSTIC FINDINGS:  Nothing recent  PATIENT SURVEYS:  FOTO 50%  62 predicted  COGNITION: Overall cognitive status: Within functional limits for tasks assessed  SENSATION: Tremors with intention   appt with neurosurgeon  POSTURE: rounded shoulders, forward head, and flexed trunk   PALPATION: TTP over cervical paraspinals  Left > Right   CERVICAL ROM:   Active ROM A/PROM (deg) eval  Flexion  40  Extension 28  Right lateral flexion 20  Left lateral flexion 12  Right rotation 40  Left  rotation 30   (Blank rows = not tested)  UPPER EXTREMITY ROM:  Active ROM Right eval Left eval  Shoulder flexion 152/ P 160 142/P 160  Shoulder extension    Shoulder abduction    Shoulder adduction    Shoulder extension    Shoulder internal rotation Reach to  T-11 Reach to L-1   Shoulder external rotation Finger tip to C7 Finger tip to C-7  Elbow flexion    Elbow extension    Wrist flexion    Wrist extension    Wrist ulnar deviation    Wrist radial deviation    Wrist pronation    Wrist supination     (Blank rows = not tested)  UPPER EXTREMITY MMT:   grossly 4/5 in bil UE  MMT Right eval Left eval  Shoulder flexion    Shoulder extension    Shoulder abduction    Shoulder adduction    Shoulder extension    Shoulder internal rotation    Shoulder external rotation    Middle trapezius    Lower trapezius    Elbow flexion    Elbow extension    Wrist flexion    Wrist extension    Wrist ulnar deviation    Wrist radial deviation    Wrist pronation    Wrist supination    Grip strength 68.33 lb 78.33 lb   (Blank rows = not tested)   FUNCTIONAL TESTS:  5 times sit to stand: 10.67 sec DNF  15 sec from (37 sec norm)  TREATMENT:                                                                                                                              [facility] Adult PT Treatment:                                                DATE: 02/21/2023 Therapeutic Exercise: UBE L3 x 5 min ( FWD/ BWD 2:30 )  Seated rows 2 x 10 35# (cues to sit up straight to promote ant pelvic tilt) with tactile cues for scapular retraction  Standing horizontal abduction, and diagonals 2 x 10 (with rolled towel between shoulder blades to promote activation) with blue theraband Money scapular retraction with ER 2 x 12 with Blue theraband Seated anterior pelvic tilt 1 x 5 holding 5 sec  Updated HEP for anterior pelvic tilt Manual Therapy: MTPR for the upper traps and cervical paraspinals Self  Care: Seated posture using lumbar roll or anterior pelvic tilt to maintain lumbar lordotic curve vs sacral sitting.   [facility] Adult PT Treatment:  DATE: 02/19/23 Therapeutic Exercise: UBE  L2 2 min each way  Standing ITY GTB 90/90 KB Brandon 5# progressed to 10# Seated tricep press 20# 2 x 10 Seated chest press 15# 2 x 10  Free motion ROW 26# total using Bil handles  Standing shoulder Ext Blue 10 x 2  GTB retract and ER 10 x 2   Therapeutic Activity: Hip hinge- bending at his tables, pacing and activity modifications  [facility] Adult PT Treatment:                                                DATE: 02/15/23 Therapeutic Exercise: SUPINE:15# bilat UE , tricep press 2 x 10, chest press 2 x 10 [place] Shoulder Rows GTB 10 x 2  Standing Shoulder EXT GTB 10 x 2  Standing GTB alternating diagonals to fatigue Standing GTB scap retract with Bil ER 10 x 2  Manual Therapy: STW to bil UT, BIL LS, T-2 to T-8,  C3- C-7  PA mobs C-6 to T-8. Wayland Denis [place], PT) Lateral glides C2- C-4 Wayland Denis [place], [place]) Gentle cervical distraction Garen Lah, PT) Trigger Point Dry Needling Treatment: Garen Lah, PT)  Pre-treatment instruction: Patient instructed on dry needling rationale, procedures, and possible side effects including pain during treatment (achy,cramping feeling), bruising, drop of blood, lightheadedness, nausea, sweating. Patient Consent Given: Yes Education handout provided: Previously provided Muscles treated: bil UT, BIL LS, T-2 to T-8,  C3- C-7  Needle size and number: .30x89mm x 3 Electrical stimulation performed: No Parameters: N/A Treatment response/outcome: Twitch response elicited and Palpable decrease in muscle tension Post-treatment instructions: Patient instructed to expect possible mild to moderate muscle soreness later today and/or tomorrow. Patient instructed in methods to reduce muscle soreness and to continue prescribed  HEP. If patient was dry needled over the lung field, patient was instructed on signs and symptoms of pneumothorax and, however unlikely, to see immediate medical attention should they occur. Patient was also educated on signs and symptoms of infection and to seek medical attention should they occur. Patient verbalized understanding of these instructions and education.    PATIENT EDUCATION:  Education details: POC Explanation of findings, issue HEP  went over [place] Person educated: Patient Education method: Explanation, Demonstration, Tactile cues, Verbal cues, and Handouts Education comprehension: verbalized understanding, returned demonstration, verbal cues required, tactile cues required, and needs further education  HOME EXERCISE PROGRAM: Access Code: [email or web address] URL: [email or web address] Date: 02/21/2023 Prepared by: Lulu Riding  Exercises - Shoulder External Rotation and Scapular Retraction with Resistance  - 2 x daily - 7 x weekly - 3 sets - 10 reps - Scapular Retraction with Resistance  - 2 x daily - 7 x weekly - 3 sets - 10 reps - Shoulder Extension with Resistance - Palms Forward  - 1 x daily - 7 x weekly - 3 sets - 10 reps - Seated Scapular Retraction  - 1 x daily - 7 x weekly - 3 sets - 10 reps - Supine Cervical Retraction with Towel  - 1 x daily - 7 x weekly - 1 sets - 10 reps - Seated Levator Scapulae Stretch  - 1 x daily - 7 x weekly - 1 sets - 3 reps - 30 hold - Seated Cervical Sidebending Stretch  - 1 x daily - 7 x weekly - 1 sets - 3 reps - 30 hold - Doorway Pec  Stretch at 90 Degrees Abduction  - 1 x daily - 7 x weekly - 3 sets - 10 reps - Seated Anterior Pelvic Tilt  - 1 x daily - 7 x weekly - 2 sets - 10 reps - 5 seconds hold  ASSESSMENT:  CLINICAL IMPRESSION: 02/21/2023 Skip arrives to session noting that his neck is making progress with some soreness/ tightness but his back is what gives him the issue lately that occurs with getting up from a seated  position. Reviewed sitting posture and mechanics and worked on maintaining lumbar lordosis avoiding sacral sitting which greatly reduced his pain. Demonstrated how to perform self trigger point release and tools that can assist with self treatment at home. He continued  working on posterior chain activation.   EVAL: Patient is a 72 y.o. male who was seen today for physical therapy evaluation and treatment for cervical spondylosis and pain central to cervical spine but radiating into bil neck.  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   OBJECTIVE IMPAIRMENTS: decreased ROM, decreased strength, impaired flexibility, impaired UE functional use, postural dysfunction, and pain.   ACTIVITY LIMITATIONS: carrying, lifting, bending, sleeping, reach over head, and carrying heavy items for work  PARTICIPATION LIMITATIONS: driving, community activity, and occupation  PERSONAL FACTORS: THA L, head/neck oropharyngeal CA remission 13 years, HTN, HBP, Hypothyroidism, back surgery 2024 January for L-4/L5 are also affecting patient's functional outcome.   REHAB POTENTIAL: Good  CLINICAL DECISION MAKING: Evolving/moderate complexity  EVALUATION COMPLEXITY: Moderate   GOALS: Goals reviewed with patient? No  SHORT TERM GOALS: Target date: 02-22-23  Pt will be independent with initial HEP Baseline: limited knowledge Goal status: ONGOING   2.  Pt pain with functional activities reduced to 5/10 Baseline: 10/10 with carrying stained glass pieces 35 to 40 # Goal status: ONGOING  3.  Demonstrate understanding of proper sitting posture, body mechanics, work ergonomics, and be more conscious of position and posture throughout the day.  Baseline: needs education 02/19/23: discussed hip hinge for work at his various table Goal status: ONGOING   LONG TERM GOALS: Target date: 03-22-23  Pt will be independent with advanced HEP  Baseline: limited knowledge Goal status:  INITIAL  2.  Demonstrate and verbalize techniques to reduce the risk of re-injury including: lifting, posture, body mechanics especially for work items like stained glass art Baseline: difficulty carrying without increasing pain to 10/10 Goal status: INITIAL  3.  Improved cervical rotation or lumbar compenstation to allow checking blind spots while driving with minimal discomfort Baseline: Pt with limited left cervical rotation  Goal status: INITIAL  4.  Pt will be able to lift at least 35 # without exacerbating pain in neck using good body mechanics Baseline: Pt avoiding lifting due to 10/10 pain Goal status: INITIAL  5.  FOTO will improve from 50%   to  62%   indicating improved functional mobility.  Baseline: 50% Goal status: INITIAL  6.  Pain will decrease to 3/10 with all functional activities and pt will be able to verbalize pain management strategies to implement Baseline: 10/10 Goal status: INITIAL  7. Pt will be able to perform DNF supine test and improve to at least 30 sec to show increased DNF strength  Baseline 15 sec  Goal status: INITIAL   PLAN:  PT FREQUENCY: 1-2x/week  PT DURATION: 8 weeks  PLANNED INTERVENTIONS: [id number]- PT Re-evaluation, 97110-Therapeutic exercises, 97530- Therapeutic activity, [id number]- Neuromuscular re-education, 97535- Self Care, [id number]- Manual therapy, [id number]-  Electrical stimulation (manual), Patient/Family education, Dry Needling, Joint mobilization, Spinal mobilization, Cryotherapy, and Moist heat  PLAN FOR NEXT SESSION: TPDN and manual and progressive strengthening, core   Lashayla Armes PT, DPT, LAT, ATC  02/21/23  12:10 PM

## 2023-02-22 LAB — IMMUNOFIXATION, SERUM
IgA/Immunoglobulin A, Serum: 232 mg/dL (ref 61–437)
IgG (Immunoglobin G), Serum: 853 mg/dL (ref 603–1613)
IgM (Immunoglobulin M), Srm: 130 mg/dL (ref 20–172)

## 2023-02-25 ENCOUNTER — Other Ambulatory Visit: Payer: Self-pay | Admitting: Internal Medicine

## 2023-02-25 DIAGNOSIS — K219 Gastro-esophageal reflux disease without esophagitis: Secondary | ICD-10-CM

## 2023-02-26 ENCOUNTER — Encounter: Payer: Medicare HMO | Admitting: Physical Therapy

## 2023-02-27 ENCOUNTER — Encounter: Payer: Self-pay | Admitting: Physical Therapy

## 2023-02-27 ENCOUNTER — Ambulatory Visit: Payer: Medicare HMO | Admitting: Physical Therapy

## 2023-02-27 DIAGNOSIS — M6281 Muscle weakness (generalized): Secondary | ICD-10-CM

## 2023-02-27 DIAGNOSIS — M542 Cervicalgia: Secondary | ICD-10-CM

## 2023-02-27 DIAGNOSIS — R293 Abnormal posture: Secondary | ICD-10-CM

## 2023-02-27 NOTE — Therapy (Signed)
OUTPATIENT PHYSICAL THERAPY CERVICAL TREATMENT   Patient Name: Brandon [name] MRN: [id number] DOB:[birth year], 72 y.o., male Today's Date: 02/27/2023  END OF SESSION:  PT End of Session - 02/27/23 1016     Visit Number 5    Number of Visits 16    Date for PT Re-Evaluation 03/22/23    Authorization Type [employer] MCR    Progress Note Due on Visit 10    PT Start Time 1016    PT Stop Time 1100    PT Time Calculation (min) 44 min    Activity Tolerance Patient tolerated treatment well    Behavior During Therapy WFL for tasks assessed/performed               Past Medical History:  Diagnosis Date   Allergy    mild   Aortic aneurysm (HCC)    small and watching. followed by his PCP Dr. Sanda Linger, 02/06/2022   GERD (gastroesophageal reflux disease)    Head and neck cancer    head and neck ca dx 03/2009   Hyperlipidemia    Hypertension    Hypothyroidism    oropharyngeal ca dx'd 03/2009   chemo/xrt comp 06/2009   Swallowing difficulty    due to his cancer in neck and throat sometimes has problems swalling pills and had problem intubation due to radion to neck are. 02/06/2022   Thyroid disease    Wears glasses    reading 02/06/2022   Past Surgical History:  Procedure Laterality Date   CATARACT EXTRACTION Right    had it a couple of months ago   02/06/2022   HERNIA REPAIR     inguinal   hip replacemnt left Left    Left eye     LUMBAR LAMINECTOMY     stmach surgery     repair peg tube site   TRANSURETHRAL RESECTION OF BLADDER TUMOR WITH MITOMYCIN-C Bilateral 02/10/2022   Procedure: TRANSURETHRAL RESECTION OF BLADDER TUMOR  AND  POST OP INSTILLATION OF GEMCITABINE;  Surgeon: Jerilee Field, MD;  Location: [facility] ;  Service: Urology;  Laterality: Bilateral;  1 HR FOR CASE   Patient Active Problem List   Diagnosis Date Noted   (HFpEF) heart failure with preserved ejection fraction (HCC) 02/20/2023   Stage 3a chronic kidney disease (HCC) 01/09/2023    Essential hypertension, benign 01/09/2023   Tremor 01/09/2023   Chronic idiopathic gout involving toe of left foot without tophus 12/06/2022   Allergic rhinitis 04/22/2022   Need for prophylactic vaccination and inoculation against varicella 10/12/2021   Encounter for general adult medical examination with abnormal findings 04/13/2021   Degenerative disc disease, lumbar 03/14/2021   Ventral hernia without obstruction or gangrene 10/06/2020   Degenerative disc disease, cervical 09/18/2018   Thiamine deficiency 04/27/2018   Renal cyst, left 04/24/2018   Alcohol abuse, continuous 04/24/2018   Lead exposure 04/24/2018   Benign prostatic hyperplasia without lower urinary tract symptoms 04/23/2017   Hypomagnesemia 02/20/2017   Hyperlipidemia with target LDL less than 130 09/09/2014   Hypothyroidism 09/09/2014   Thoracic aortic aneurysm without rupture (HCC) 03/13/2014   Neuropathy due to chemotherapeutic drug (HCC) 09/08/2013   Insomnia 07/30/2013   CARCINOMA, SQUAMOUS CELL 04/28/2010   GERD 02/11/2007    PCP: Etta Grandchild, MD   REFERRING PROVIDER: Marikay Alar, PhD   REFERRING DIAG: [phone] (ICD-10-CM) - Other spondylosis with radiculopathy, cervical region   THERAPY DIAG:  Cervicalgia  Abnormal posture  Muscle weakness (generalized)  Rationale for Evaluation and  Treatment: Rehabilitation  ONSET DATE:  Three years or more,  SUBJECTIVE:                                                                                                                                                                                                         SUBJECTIVE STATEMENT: "I am feeling a little more sore, I did a lot of work reaching over head the other day and neck and back are sore."   EVAL: I have had neck pain for several years.  I receive cortisone injections which  helped before but it no longer is effective.   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 Sometimes I have to pick up 100 pound windows by sliding it.  I routinely need to pick up 30 to 40 pounds in my work.  I have tried TPDN at another place and it really hurt.  I was told I can get that here as well.  I am having tremors that I am seeing a neurologist on November 5.   Hand dominance: Right  PERTINENT HISTORY:  THA L, head/neck oropharyngeal CA remission 13 years, HTN, HBP, Hypothyroidism, back surgery 2024 January for L-4/L5  PAIN:  Are you having pain? Yes: NPRS scale: Back and neck 2/10 Pain location: bil UT/ neck in midline Pain description: achy pain Aggravating factors: bending over and lifting items, turning head to drive especially to Left, lifting over my head and constantly bending over.  Relieving factors: sitting heat  PRECAUTIONS: None  RED FLAGS: None     WEIGHT BEARING RESTRICTIONS: No  FALLS:  Has patient fallen in last 6 months? 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   PLOF: Independent  PATIENT GOALS: Neck decrease pain and sometimes limited turning.   NEXT MD VISIT: TBD  OBJECTIVE:  Note: Objective measures were completed at Evaluation unless otherwise noted.  DIAGNOSTIC FINDINGS:  Nothing recent  PATIENT SURVEYS:  FOTO 50%  62 predicted  COGNITION: Overall cognitive status: Within functional limits for tasks assessed  SENSATION: Tremors with intention   appt with neurosurgeon  POSTURE: rounded shoulders, forward head, and flexed trunk   PALPATION: TTP over cervical paraspinals  Left > Right   CERVICAL  ROM:   Active ROM A/PROM (deg) eval  Flexion 40  Extension 28  Right lateral flexion 20  Left lateral flexion 12   Right rotation 40  Left rotation 30   (Blank rows = not tested)  UPPER EXTREMITY ROM:  Active ROM Right eval Left eval  Shoulder flexion 152/ P 160 142/P 160  Shoulder extension    Shoulder abduction    Shoulder adduction    Shoulder extension    Shoulder internal rotation Reach to  T-11 Reach to L-1   Shoulder external rotation Finger tip to C7 Finger tip to C-7  Elbow flexion    Elbow extension    Wrist flexion    Wrist extension    Wrist ulnar deviation    Wrist radial deviation    Wrist pronation    Wrist supination     (Blank rows = not tested)  UPPER EXTREMITY MMT:   grossly 4/5 in bil UE  MMT Right eval Left eval  Shoulder flexion    Shoulder extension    Shoulder abduction    Shoulder adduction    Shoulder extension    Shoulder internal rotation    Shoulder external rotation    Middle trapezius    Lower trapezius    Elbow flexion    Elbow extension    Wrist flexion    Wrist extension    Wrist ulnar deviation    Wrist radial deviation    Wrist pronation    Wrist supination    Grip strength 68.33 lb 78.33 lb   (Blank rows = not tested)   FUNCTIONAL TESTS:  5 times sit to stand: 10.67 sec DNF  15 sec from (37 sec norm)  TREATMENT:                                                                                                                              [facility] Adult PT Treatment:                                                DATE: 02/27/2023 Therapeutic Exercise: UBE L4 x 4 min (FWD/BWD x 2 min ea) Manual Therapy: IASTM along sub-occiptials, cervical paraspinals and bil upper traps Bil rib mobs grade III T1-T8 PA grade III  Trigger Point Dry-Needling  Treatment instructions: Expect mild to moderate muscle soreness. S/S of pneumothorax if dry needled over a lung field, and to seek immediate medical attention should they occur. Patient verbalized understanding of these instructions and education.  Patient Consent Given: Yes Education handout  provided: Previously provided Muscles treated: bil upper trap, sub-occipitals and cervical paraspinals Electrical stimulation performed: Yes for cervical paraspinals Parameters:  CPS L20 x 10 min adjusting to tolerance PRN Treatment response/outcome: twitch response noted, relief of tension   [facility] Adult PT Treatment:  DATE: 02/21/2023 Therapeutic Exercise: UBE L3 x 5 min ( FWD/ BWD 2:30 )  Seated rows 2 x 10 35# (cues to sit up straight to promote ant pelvic tilt) with tactile cues for scapular retraction  Standing horizontal abduction, and diagonals 2 x 10 (with rolled towel between shoulder blades to promote activation) with blue theraband Money scapular retraction with ER 2 x 12 with Blue theraband Seated anterior pelvic tilt 1 x 5 holding 5 sec  Updated HEP for anterior pelvic tilt Manual Therapy: MTPR for the upper traps and cervical paraspinals Self Care: Seated posture using lumbar roll or anterior pelvic tilt to maintain lumbar lordotic curve vs sacral sitting.   [facility] Adult PT Treatment:                                                DATE: 02/19/23 Therapeutic Exercise: UBE  L2 2 min each way  Standing ITY GTB 90/90 KB carry 5# progressed to 10# Seated tricep press 20# 2 x 10 Seated chest press 15# 2 x 10  Free motion ROW 26# total using Bil handles  Standing shoulder Ext Blue 10 x 2  GTB retract and ER 10 x 2   Therapeutic Activity: Hip hinge- bending at his tables, pacing and activity modifications  PATIENT EDUCATION:  Education details: POC Explanation of findings, issue HEP  went over [employer] Person educated: Patient Education method: Explanation, Demonstration, Tactile cues, Verbal cues, and Handouts Education comprehension: verbalized understanding, returned demonstration, verbal cues required, tactile cues required, and needs further education  HOME EXERCISE PROGRAM: Access Code: [email or web address] URL:  [email or web address] Date: 02/21/2023 Prepared by: Lulu Riding  Exercises - Shoulder External Rotation and Scapular Retraction with Resistance  - 2 x daily - 7 x weekly - 3 sets - 10 reps - Scapular Retraction with Resistance  - 2 x daily - 7 x weekly - 3 sets - 10 reps - Shoulder Extension with Resistance - Palms Forward  - 1 x daily - 7 x weekly - 3 sets - 10 reps - Seated Scapular Retraction  - 1 x daily - 7 x weekly - 3 sets - 10 reps - Supine Cervical Retraction with Towel  - 1 x daily - 7 x weekly - 1 sets - 10 reps - Seated Levator Scapulae Stretch  - 1 x daily - 7 x weekly - 1 sets - 3 reps - 30 hold - Seated Cervical Sidebending Stretch  - 1 x daily - 7 x weekly - 1 sets - 3 reps - 30 hold - Doorway Pec Stretch at 90 Degrees Abduction  - 1 x daily - 7 x weekly - 3 sets - 10 reps - Seated Anterior Pelvic Tilt  - 1 x daily - 7 x weekly - 2 sets - 10 reps - 5 seconds hold  ASSESSMENT:  CLINICAL IMPRESSION: 02/27/2023 Mr Gemberling arrives to session reporting feeling more stiff/ sore in his neck/ back today compared to last session. Reviewed TPDN and performed on upper trap and E-stim was combined with cervical paraspinals followed with IASTM techniques and mobs and exercise. End of session he noted feelig looser compared with when he arrived.    EVAL: Patient is a 72 y.o. male who was seen today for physical therapy evaluation and treatment for cervical spondylosis and pain central to cervical spine but radiating into bil neck.  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   OBJECTIVE IMPAIRMENTS: decreased ROM, decreased strength, impaired flexibility, impaired UE functional use, postural dysfunction, and pain.   ACTIVITY LIMITATIONS: carrying, lifting, bending, sleeping, reach over head, and carrying heavy items for work  PARTICIPATION LIMITATIONS: driving, community activity, and occupation  PERSONAL FACTORS: THA L, head/neck  oropharyngeal CA remission 13 years, HTN, HBP, Hypothyroidism, back surgery 2024 January for L-4/L5 are also affecting patient's functional outcome.   REHAB POTENTIAL: Good  CLINICAL DECISION MAKING: Evolving/moderate complexity  EVALUATION COMPLEXITY: Moderate   GOALS: Goals reviewed with patient? No  SHORT TERM GOALS: Target date: 02-22-23  Pt will be independent with initial HEP Baseline: limited knowledge Goal status: ONGOING   2.  Pt pain with functional activities reduced to 5/10 Baseline: 10/10 with carrying stained glass pieces 35 to 40 # Goal status: ONGOING  3.  Demonstrate understanding of proper sitting posture, body mechanics, work ergonomics, and be more conscious of position and posture throughout the day.  Baseline: needs education 02/19/23: discussed hip hinge for work at his various table Goal status: ONGOING   LONG TERM GOALS: Target date: 03-22-23  Pt will be independent with advanced HEP  Baseline: limited knowledge Goal status: INITIAL  2.  Demonstrate and verbalize techniques to reduce the risk of re-injury including: lifting, posture, body mechanics especially for work items like stained glass art Baseline: difficulty carrying without increasing pain to 10/10 Goal status: INITIAL  3.  Improved cervical rotation or lumbar compenstation to allow checking blind spots while driving with minimal discomfort Baseline: Pt with limited left cervical rotation  Goal status: INITIAL  4.  Pt will be able to lift at least 35 # without exacerbating pain in neck using good body mechanics Baseline: Pt avoiding lifting due to 10/10 pain Goal status: INITIAL  5.  FOTO will improve from 50%   to  62%   indicating improved functional mobility.  Baseline: 50% Goal status: INITIAL  6.  Pain will decrease to 3/10 with all functional activities and pt will be able to verbalize pain management strategies to implement Baseline: 10/10 Goal status: INITIAL  7. Pt will  be able to perform DNF supine test and improve to at least 30 sec to show increased DNF strength  Baseline 15 sec  Goal status: INITIAL   PLAN:  PT FREQUENCY: 1-2x/week  PT DURATION: 8 weeks  PLANNED INTERVENTIONS: 97164- PT Re-evaluation, 97110-Therapeutic exercises, 97530- Therapeutic activity, 97112- Neuromuscular re-education, 97535- Self Care, 16109- Manual therapy, 60454- Electrical stimulation (manual), Patient/Family education, Dry Needling, Joint mobilization, Spinal mobilization, Cryotherapy, and Moist heat  PLAN FOR NEXT SESSION: TPDN and manual and progressive strengthening, core   Jalah Warmuth PT, DPT, LAT, ATC  02/27/23  11:03 AM

## 2023-02-28 NOTE — Therapy (Signed)
OUTPATIENT PHYSICAL THERAPY CERVICAL TREATMENT   Patient Name: Brandon [name] MRN: [id number] DOB:12/27/1950, 72 y.o., male Today's Date: 03/01/2023  END OF SESSION:  PT End of Session - 03/01/23 1022     Visit Number 6    Number of Visits 16    Date for PT Re-Evaluation 03/22/23    Authorization Type [employer] MCR    Progress Note Due on Visit 10    PT Start Time 1020    PT Stop Time 1100    PT Time Calculation (min) 40 min    Activity Tolerance Patient tolerated treatment well    Behavior During Therapy WFL for tasks assessed/performed                Past Medical History:  Diagnosis Date   Allergy    mild   Aortic aneurysm (HCC)    small and watching. followed by his PCP Dr. Sanda Linger, 02/06/2022   GERD (gastroesophageal reflux disease)    Head and neck cancer    head and neck ca dx 03/2009   Hyperlipidemia    Hypertension    Hypothyroidism    oropharyngeal ca dx'd 03/2009   chemo/xrt comp 06/2009   Swallowing difficulty    due to his cancer in neck and throat sometimes has problems swalling pills and had problem intubation due to radion to neck are. 02/06/2022   Thyroid disease    Wears glasses    reading 02/06/2022   Past Surgical History:  Procedure Laterality Date   CATARACT EXTRACTION Right    had it a couple of months ago   02/06/2022   HERNIA REPAIR     inguinal   hip replacemnt left Left    Left eye     LUMBAR LAMINECTOMY     stmach surgery     repair peg tube site   TRANSURETHRAL RESECTION OF BLADDER TUMOR WITH MITOMYCIN-C Bilateral 02/10/2022   Procedure: TRANSURETHRAL RESECTION OF BLADDER TUMOR  AND  POST OP INSTILLATION OF GEMCITABINE;  Surgeon: Jerilee Field, MD;  Location: [facility] [place];  Service: Urology;  Laterality: Bilateral;  1 HR FOR CASE   Patient Active Problem List   Diagnosis Date Noted   (HFpEF) heart failure with preserved ejection fraction (HCC) 02/20/2023   Stage 3a chronic kidney disease (HCC)  01/09/2023   Essential hypertension, benign 01/09/2023   Tremor 01/09/2023   Chronic idiopathic gout involving toe of left foot without tophus 12/06/2022   Allergic rhinitis 04/22/2022   Need for prophylactic vaccination and inoculation against varicella 10/12/2021   Encounter for general adult medical examination with abnormal findings 04/13/2021   Degenerative disc disease, lumbar 03/14/2021   Ventral hernia without obstruction or gangrene 10/06/2020   Degenerative disc disease, cervical 09/18/2018   Thiamine deficiency 04/27/2018   Renal cyst, left 04/24/2018   Alcohol abuse, continuous 04/24/2018   Lead exposure 04/24/2018   Benign prostatic hyperplasia without lower urinary tract symptoms 04/23/2017   Hypomagnesemia 02/20/2017   Hyperlipidemia with target LDL less than 130 09/09/2014   Hypothyroidism 09/09/2014   Thoracic aortic aneurysm without rupture (HCC) 03/13/2014   Neuropathy due to chemotherapeutic drug (HCC) 09/08/2013   Insomnia 07/30/2013   CARCINOMA, SQUAMOUS CELL 04/28/2010   GERD 02/11/2007    PCP: Etta Grandchild, MD   REFERRING PROVIDER: Marikay Alar, PhD   REFERRING DIAG: [phone] (ICD-10-CM) - Other spondylosis with radiculopathy, cervical region   THERAPY DIAG:  Cervicalgia  Abnormal posture  Muscle weakness (generalized)  Rationale for Evaluation  and Treatment: Rehabilitation  ONSET DATE:  Three years or more,  SUBJECTIVE:                                                                                                                                                                                                         SUBJECTIVE STATEMENT: I was abouta 3-4 /10 the other day after needling and massage and I stayed sore the next day.   I have a massage lady that does lighter massage.  The last time was a little much.  Pt today is a 2/10 pain   EVAL: I have had neck pain for several years.  I receive cortisone injections which  helped before but it  no longer is effective.   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 Sometimes I have to pick up 100 pound windows by sliding it.  I routinely need to pick up 30 to 40 pounds in my work.  I have tried TPDN at another place and it really hurt.  I was told I can get that here as well.  I am having tremors that I am seeing a neurologist on November 5.   Hand dominance: Right  PERTINENT HISTORY:  THA L, head/neck oropharyngeal CA remission 13 years, HTN, HBP, Hypothyroidism, back surgery 2024 January for L-4/L5  PAIN:  Are you having pain? Yes: NPRS scale: Back and neck 2/10 Pain location: bil UT/ neck in midline Pain description: achy pain Aggravating factors: bending over and lifting items, turning head to drive especially to Left, lifting over my head and constantly bending over.  Relieving factors: sitting heat  PRECAUTIONS: None  RED FLAGS: None     WEIGHT BEARING RESTRICTIONS: No  FALLS:  Has patient fallen in last 6 months? 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   PLOF: Independent  PATIENT GOALS: Neck decrease pain and sometimes limited turning.   NEXT MD VISIT: TBD  OBJECTIVE:  Note: Objective measures were completed at Evaluation unless otherwise noted.  DIAGNOSTIC FINDINGS:  Nothing recent  PATIENT SURVEYS:  FOTO 50%  62 predicted  COGNITION: Overall cognitive status: Within functional limits for tasks assessed  SENSATION: Tremors with intention   appt with neurosurgeon  POSTURE:  rounded shoulders, forward head, and flexed trunk   PALPATION: TTP over cervical paraspinals  Left > Right   CERVICAL ROM:   Active ROM A/PROM  (deg) eval AROM 03-01-23  Flexion 40   Extension 28   Right lateral flexion 20   Left lateral flexion 12   Right rotation 40 42  Left rotation 30 35   (Blank rows = not tested)  UPPER EXTREMITY ROM:  Active ROM Right eval Left eval  Shoulder flexion 152/ P 160 142/P 160  Shoulder extension    Shoulder abduction    Shoulder adduction    Shoulder extension    Shoulder internal rotation Reach to  T-11 Reach to L-1   Shoulder external rotation Finger tip to C7 Finger tip to C-7  Elbow flexion    Elbow extension    Wrist flexion    Wrist extension    Wrist ulnar deviation    Wrist radial deviation    Wrist pronation    Wrist supination     (Blank rows = not tested)  UPPER EXTREMITY MMT:   grossly 4/5 in bil UE  MMT Right eval Left eval  Shoulder flexion    Shoulder extension    Shoulder abduction    Shoulder adduction    Shoulder extension    Shoulder internal rotation    Shoulder external rotation    Middle trapezius    Lower trapezius    Elbow flexion    Elbow extension    Wrist flexion    Wrist extension    Wrist ulnar deviation    Wrist radial deviation    Wrist pronation    Wrist supination    Grip strength 68.33 lb 78.33 lb   (Blank rows = not tested)   FUNCTIONAL TESTS:  5 times sit to stand: 10.67 sec DNF  15 sec from (37 sec norm)  TREATMENT:    [facility] Adult PT Treatment:                                                DATE: 03-01-23 Therapeutic Exercise: Farmers Nurse, mental health with 45 lb for 60 feet x 4 120 feet with 25 lb KB x 2 increasing grip strength for entire time Manual Therapy: STW  to bil UT/LS Suboccipital release T1-T8 PA grade III  Trigger Point Dry-Needling  Treatment instructions: Expect mild to moderate muscle soreness. S/S of pneumothorax if dry needled over a lung field, and to seek immediate medical attention should they occur. Patient verbalized understanding of these instructions and education.  Patient Consent Given:  Yes Education handout provided: Previously provided Muscles treated: bil upper trap, sub-occipitals and cervical paraspinals Electrical stimulation performed: Yes for cervical paraspinals Parameters: CPS L25x 10 min adjusting to tolerance PRN Treatment response/outcome: twitch response noted, relief of tension  [facility] Adult PT Treatment:                                                DATE: 02/27/2023 Therapeutic Exercise: UBE L4 x 4 min (FWD/BWD x 2 min ea) Manual Therapy: IASTM along sub-occiptials, cervical paraspinals and bil upper traps Bil rib mobs grade III T1-T8 PA grade III  Trigger Point Dry-Needling  Treatment instructions: Expect mild to moderate muscle soreness. S/S of pneumothorax if dry needled over a lung field, and to seek immediate medical attention should they occur. Patient verbalized understanding of these instructions and education.  Patient Consent Given: Yes Education handout provided: Previously provided Muscles treated: bil upper trap, sub-occipitals and cervical paraspinals Electrical stimulation performed: Yes for cervical paraspinals Parameters:  CPS L20 x 10 min adjusting to tolerance PRN Treatment response/outcome: twitch response noted, relief of tension   [facility] Adult PT Treatment:                                                DATE: 02/21/2023 Therapeutic Exercise: UBE L3 x 5 min ( FWD/ BWD 2:30 )  Seated rows 2 x 10 35# (cues to sit up straight to promote ant pelvic tilt) with tactile cues for scapular retraction  Standing horizontal abduction, and diagonals 2 x 10 (with rolled towel between shoulder blades to promote activation) with blue theraband Money scapular retraction with ER 2 x 12 with Blue theraband Seated anterior pelvic tilt 1 x 5 holding 5 sec  Updated HEP for anterior pelvic tilt Manual Therapy: MTPR for the upper traps  and cervical paraspinals Self Care: Seated posture using lumbar roll or anterior pelvic tilt to maintain lumbar lordotic curve vs sacral sitting.   [facility] Adult PT Treatment:                                                DATE: 02/19/23 Therapeutic Exercise: UBE  L2 2 min each way  Standing ITY GTB 90/90 KB carry 5# progressed to 10# Seated tricep press 20# 2 x 10 Seated chest press 15# 2 x 10  Free motion ROW 26# total using Bil handles  Standing shoulder Ext Blue 10 x 2  GTB retract and ER 10 x 2   Therapeutic Activity: Hip hinge- bending at his tables, pacing and activity modifications  PATIENT EDUCATION:  Education details: POC Explanation of findings, issue HEP  went over [employer] Person educated: Patient Education method: Explanation, Demonstration, Tactile cues, Verbal cues, and Handouts Education comprehension: verbalized understanding, returned demonstration, verbal cues required, tactile cues required, and needs further education  HOME EXERCISE PROGRAM: Access Code: [email or web address] URL: [email or web address] Date: 02/21/2023 Prepared by: Lulu Riding  Exercises - Shoulder External Rotation and Scapular Retraction with Resistance  - 2 x daily - 7 x weekly - 3 sets - 10 reps - Scapular Retraction with Resistance  - 2 x daily - 7 x weekly - 3 sets - 10 reps - Shoulder Extension with Resistance - Palms Forward  - 1 x daily - 7 x weekly - 3 sets - 10 reps - Seated Scapular Retraction  -  1 x daily - 7 x weekly - 3 sets - 10 reps - Supine Cervical Retraction with Towel  - 1 x daily - 7 x weekly - 1 sets - 10 reps - Seated Levator Scapulae Stretch  - 1 x daily - 7 x weekly - 1 sets - 3 reps - 30 hold - Seated Cervical Sidebending Stretch  - 1 x daily - 7 x weekly - 1 sets - 3 reps - 30 hold - Doorway Pec Stretch at 90 Degrees Abduction  - 1 x daily - 7 x weekly - 3 sets - 10 reps - Seated Anterior Pelvic Tilt  - 1 x daily - 7 x weekly - 2 sets - 10 reps - 5 seconds  hold  ASSESSMENT:  CLINICAL IMPRESSION: 03/01/2023 Mr Prickett arrives to session reporting feeling more stiff/ sore in his neck/ back today after last session with more intense massage than he is used to.  Pt worked on farmer's carry and grip  strength for 45 lb and 25  Lb KB to  Reviewed TPDN and performed on upper trap and E-stim was combined with cervical paraspinals followed with gentle STW techniques and mobs and exercise. Pt reports decreased tension and pain after session.   EVAL: Patient is a 72 y.o. male who was seen today for physical therapy evaluation and treatment for cervical spondylosis and pain central to cervical spine but radiating into bil neck.  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   OBJECTIVE IMPAIRMENTS: decreased ROM, decreased strength, impaired flexibility, impaired UE functional use, postural dysfunction, and pain.   ACTIVITY LIMITATIONS: carrying, lifting, bending, sleeping, reach over head, and carrying heavy items for work  PARTICIPATION LIMITATIONS: driving, community activity, and occupation  PERSONAL FACTORS: THA L, head/neck oropharyngeal CA remission 13 years, HTN, HBP, Hypothyroidism, back surgery 2024 January for L-4/L5 are also affecting patient's functional outcome.   REHAB POTENTIAL: Good  CLINICAL DECISION MAKING: Evolving/moderate complexity  EVALUATION COMPLEXITY: Moderate   GOALS: Goals reviewed with patient? No  SHORT TERM GOALS: Target date: 02-22-23  Pt will be independent with initial HEP Baseline: limited knowledge Goal status: ONGOING   2.  Pt pain with functional activities reduced to 5/10 Baseline: 10/10 with carrying stained glass pieces 35 to 40 # Goal status: ONGOING  3.  Demonstrate understanding of proper sitting posture, body mechanics, work ergonomics, and be more conscious of position and posture throughout the day.  Baseline: needs education 02/19/23: discussed hip hinge for work  at his various table Goal status: ONGOING   LONG TERM GOALS: Target date: 03-22-23  Pt will be independent with advanced HEP  Baseline: limited knowledge Goal status: ONGOING  2.  Demonstrate and verbalize techniques to reduce the risk of re-injury including: lifting, posture, body mechanics especially for work items like stained glass art Baseline: difficulty carrying without increasing pain to 10/10 Goal status: ONGOING  3.  Improved cervical rotation or lumbar compenstation to allow checking blind spots while driving with minimal discomfort Baseline: Pt with limited left cervical rotation  Goal status: INITIAL  4.  Pt will be able to lift at least 35 # without exacerbating pain in neck using good body mechanics Baseline: Pt avoiding lifting due to 10/10 pain 03-01-23  Jimmye Norman carry of 45 #   Goal status: MET  5.  FOTO will improve from 50%   to  62%   indicating improved functional mobility.  Baseline: 50% Goal status: INITIAL  6.  Pain will decrease to 3/10 with all functional activities and pt will be able to verbalize pain management strategies to implement Baseline: 10/10 Goal status: ONGOING  7. Pt will be able to perform DNF supine test and improve to at least 30 sec to show increased DNF strength  Baseline 15 sec  Goal status:ONGOING   PLAN:  PT FREQUENCY: 1-2x/week  PT DURATION: 8 weeks  PLANNED INTERVENTIONS: 97164- PT Re-evaluation, 97110-Therapeutic exercises, 97530- Therapeutic activity, 97112- Neuromuscular re-education, 97535- Self Care, 16109- Manual therapy, 60454- Electrical stimulation (manual), Patient/Family education, Dry Needling, Joint mobilization, Spinal mobilization, Cryotherapy, and Moist heat  PLAN FOR NEXT SESSION: TPDN and manual and progressive strengthening, core   Garen Lah, PT, ATRIC Certified Exercise Expert for the Aging Adult  03/01/23 12:48 PM Phone: [phone] Fax: [phone]

## 2023-03-01 ENCOUNTER — Ambulatory Visit: Payer: Medicare HMO | Admitting: Physical Therapy

## 2023-03-01 DIAGNOSIS — M6281 Muscle weakness (generalized): Secondary | ICD-10-CM

## 2023-03-01 DIAGNOSIS — M542 Cervicalgia: Secondary | ICD-10-CM

## 2023-03-01 DIAGNOSIS — R293 Abnormal posture: Secondary | ICD-10-CM

## 2023-03-01 NOTE — Therapy (Addendum)
OUTPATIENT PHYSICAL THERAPY CERVICAL TREATMENT/DISCHARGE NOTE PHYSICAL THERAPY DISCHARGE SUMMARY  Visits from Start of Care: 7  Current functional level related to goals / functional outcomes: As indicated below   Remaining deficits  Pt with decreased tension but remaining stiffness in neck   Education / Equipment: HEP   Patient agrees to discharge. Patient goals were partially met. Patient is being discharged due to being pleased with the current functional level.    Patient Name: Brandon [name] MRN: [id number] DOB:20-Jun-1950, 72 y.o., male Today's Date: 03/05/2023  END OF SESSION:  PT End of Session - 03/05/23 1016     Visit Number 7    Number of Visits 16    Date for PT Re-Evaluation 03/22/23    Authorization Type [employer] MCR    Progress Note Due on Visit 10    PT Start Time 1016    PT Stop Time 1105    PT Time Calculation (min) 49 min    Activity Tolerance Patient tolerated treatment well    Behavior During Therapy WFL for tasks assessed/performed                 Past Medical History:  Diagnosis Date   Allergy    mild   Aortic aneurysm (HCC)    small and watching. followed by his PCP Dr. Sanda Linger, 02/06/2022   GERD (gastroesophageal reflux disease)    Head and neck cancer    head and neck ca dx 03/2009   Hyperlipidemia    Hypertension    Hypothyroidism    oropharyngeal ca dx'd 03/2009   chemo/xrt comp 06/2009   Swallowing difficulty    due to his cancer in neck and throat sometimes has problems swalling pills and had problem intubation due to radion to neck are. 02/06/2022   Thyroid disease    Wears glasses    reading 02/06/2022   Past Surgical History:  Procedure Laterality Date   CATARACT EXTRACTION Right    had it a couple of months ago   02/06/2022   HERNIA REPAIR     inguinal   hip replacemnt left Left    Left eye     LUMBAR LAMINECTOMY     stmach surgery     repair peg tube site   TRANSURETHRAL RESECTION OF BLADDER TUMOR WITH  MITOMYCIN-C Bilateral 02/10/2022   Procedure: TRANSURETHRAL RESECTION OF BLADDER TUMOR  AND  POST OP INSTILLATION OF GEMCITABINE;  Surgeon: Jerilee Field, MD;  Location: [facility];  Service: Urology;  Laterality: Bilateral;  1 HR FOR CASE   Patient Active Problem List   Diagnosis Date Noted   (HFpEF) heart failure with preserved ejection fraction (HCC) 02/20/2023   Stage 3a chronic kidney disease (HCC) 01/09/2023   Essential hypertension, benign 01/09/2023   Tremor 01/09/2023   Chronic idiopathic gout involving toe of left foot without tophus 12/06/2022   Allergic rhinitis 04/22/2022   Need for prophylactic vaccination and inoculation against varicella 10/12/2021   Encounter for general adult medical examination with abnormal findings 04/13/2021   Degenerative disc disease, lumbar 03/14/2021   Ventral hernia without obstruction or gangrene 10/06/2020   Degenerative disc disease, cervical 09/18/2018   Thiamine deficiency 04/27/2018   Renal cyst, left 04/24/2018   Alcohol abuse, continuous 04/24/2018   Lead exposure 04/24/2018   Benign prostatic hyperplasia without lower urinary tract symptoms 04/23/2017   Hypomagnesemia 02/20/2017   Hyperlipidemia with target LDL less than 130 09/09/2014   Hypothyroidism 09/09/2014   Thoracic aortic aneurysm without  rupture (HCC) 03/13/2014   Neuropathy due to chemotherapeutic drug (HCC) 09/08/2013   Insomnia 07/30/2013   CARCINOMA, SQUAMOUS CELL 04/28/2010   GERD 02/11/2007    PCP: Etta Grandchild, MD   REFERRING PROVIDER: Marikay Alar, PhD   REFERRING DIAG: [phone] (ICD-10-CM) - Other spondylosis with radiculopathy, cervical region   THERAPY DIAG:  Cervicalgia  Abnormal posture  Muscle weakness (generalized)  Rationale for Evaluation and Treatment: Rehabilitation  ONSET DATE:  Three years or more,  SUBJECTIVE:                                                                                                                                                                                                          SUBJECTIVE STATEMENT: I have trouble looking up and extending my neck   that is always a problem.  I like to look at architecture and that is hard to look up.  1/10 pain today.  I am doing fine. I will be traveling over the holidays.    EVAL: I have had neck pain for several years.  I receive cortisone injections which  helped before but it no longer is effective.   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 Sometimes I have to pick up 100 pound windows by sliding it.  I routinely need to pick up 30 to 40 pounds in my work.  I have tried TPDN at another place and it really hurt.  I was told I can get that here as well.  I am having tremors that I am seeing a neurologist on November 5.   Hand dominance: Right  PERTINENT HISTORY:  THA L, head/neck oropharyngeal CA remission 13 years, HTN, HBP, Hypothyroidism, back surgery 2024 January for L-4/L5  PAIN:  Are you having pain? Yes: NPRS scale: Back and neck 2/10 Pain location: bil UT/ neck in midline Pain description: achy pain Aggravating factors: bending over and lifting items, turning head to drive especially to Left, lifting over my head and constantly bending over.  Relieving factors: sitting heat  PRECAUTIONS: None  RED FLAGS: None     WEIGHT BEARING RESTRICTIONS: No  FALLS:  Has patient fallen in last 6 months? 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   PLOF: Independent  PATIENT GOALS: Neck decrease pain and sometimes limited turning.   NEXT MD VISIT: TBD  OBJECTIVE:  Note: Objective measures were  completed at Evaluation unless otherwise noted.  DIAGNOSTIC FINDINGS:  Nothing recent  PATIENT SURVEYS:  FOTO 50%  62 predicted 03-05-23   59% COGNITION: Overall cognitive status: Within functional limits for tasks assessed  SENSATION: Tremors with intention   appt with neurosurgeon  POSTURE: rounded shoulders, forward head, and flexed trunk   PALPATION: TTP over cervical paraspinals  Left > Right   CERVICAL ROM:   Active ROM A/PROM (deg) eval AROM 03-01-23 AROM 03-05-23  Flexion 40  40  Extension 28  30  Right lateral flexion 20  22  Left lateral flexion 12  19  Right rotation 40 42 42  Left rotation 30 35 35   (Blank rows = not tested)  UPPER EXTREMITY ROM:  Active ROM Right eval Left eval R/L 03-05-23 AROM  Shoulder flexion 152/ P 160 142/P 160 No pain bil 155/152  Shoulder extension     Shoulder abduction     Shoulder adduction     Shoulder extension     Shoulder internal rotation Reach to  T-11 Reach to L-1  Reach to T 10 on R and to T-12 on Left  Shoulder external rotation Finger tip to C7 Finger tip to C-7 Bil to T-2  Elbow flexion     Elbow extension     Wrist flexion     Wrist extension     Wrist ulnar deviation     Wrist radial deviation     Wrist pronation     Wrist supination      (Blank rows = not tested)  UPPER EXTREMITY MMT:   grossly 4/5 in bil UE  MMT Right eval Left eval  Right and Left 03-05-23  Shoulder flexion     Shoulder extension     Shoulder abduction     Shoulder adduction     Shoulder extension     Shoulder internal rotation     Shoulder external rotation     Middle trapezius     Lower trapezius     Elbow flexion     Elbow extension     Wrist flexion     Wrist extension     Wrist ulnar deviation     Wrist radial deviation     Wrist pronation     Wrist supination     Grip strength 68.33 lb 78.33 lb 69lb/78lb   (Blank rows = not tested)   FUNCTIONAL TESTS:  5 times sit to stand: 10.67 sec DNF  15 sec from (37  sec norm)  03-05-23  60 sec hold  TREATMENT:   [facility] Adult PT Treatment:                                                DATE: 03-05-23 Therapeutic Exercise: Thoracic  self mob with pink foam roller added to HEP Chin tuck 10 x 10 sec hold Manual Therapy: STW  to bil UT/LS and cervical paraspinals Suboccipital release T1-T8 PA grade III PROM in all planes until hard end feel, lateral glide and maximizing extension/flexion Trigger Point Dry-Needling  Treatment instructions: Expect mild to moderate muscle soreness. S/S of pneumothorax if dry needled over a lung field, and to seek immediate  medical attention should they occur. Patient verbalized understanding of these instructions and education.  Patient Consent Given: Yes Education handout provided: Previously provided Muscles treated: bil upper trap, sub-occipitals and cervical  thoracic  C-3 to c-7  bil T-1 to t- 8 bilparaspinals Electrical stimulation performed: No Parameters: No Treatment response/outcome: twitch response noted, relief of tension  Self Care: Briefly reviewed posture and body mechanics with handout   [facility] Adult PT Treatment:                                                DATE: 03-01-23 Therapeutic Exercise: Farmers Nurse, mental health with 45 lb for 60 feet x 4 120 feet with 25 lb KB x 2 increasing grip strength for entire time Manual Therapy: STW  to bil UT/LS Suboccipital release T1-T8 PA grade III  Trigger Point Dry-Needling  Treatment instructions: Expect mild to moderate muscle soreness. S/S of pneumothorax if dry needled over a lung field, and to seek immediate medical attention should they occur. Patient verbalized understanding of these instructions and education.  Patient Consent Given: Yes Education handout provided: Previously provided Muscles treated: bil upper trap, sub-occipitals and cervical paraspinals Electrical stimulation performed: Yes for cervical paraspinals Parameters: CPS L25x 10 min adjusting to  tolerance PRN Treatment response/outcome: twitch response noted, relief of tension                                                                                                                         [facility] Adult PT Treatment:                                                DATE: 02/27/2023 Therapeutic Exercise: UBE L4 x 4 min (FWD/BWD x 2 min ea) Manual Therapy: IASTM along sub-occiptials, cervical paraspinals and bil upper traps Bil rib mobs grade III T1-T8 PA grade III  Trigger Point Dry-Needling  Treatment instructions: Expect mild to moderate muscle soreness. S/S of pneumothorax if dry needled over a lung field, and to seek immediate medical attention should they occur. Patient verbalized understanding of these instructions and education.  Patient Consent Given: Yes Education handout provided: Previously provided Muscles treated: bil upper trap, sub-occipitals and cervical paraspinals Electrical stimulation performed: Yes for cervical paraspinals Parameters:  CPS L20 x 10 min adjusting to tolerance PRN Treatment response/outcome: twitch response noted, relief of tension   [facility] Adult PT Treatment:                                                DATE: 02/21/2023 Therapeutic Exercise: UBE L3 x 5 min (  FWD/ BWD 2:30 )  Seated rows 2 x 10 35# (cues to sit up straight to promote ant pelvic tilt) with tactile cues for scapular retraction  Standing horizontal abduction, and diagonals 2 x 10 (with rolled towel between shoulder blades to promote activation) with blue theraband Money scapular retraction with ER 2 x 12 with Blue theraband Seated anterior pelvic tilt 1 x 5 holding 5 sec  Updated HEP for anterior pelvic tilt Manual Therapy: MTPR for the upper traps and cervical paraspinals Self Care: Seated posture using lumbar roll or anterior pelvic tilt to maintain lumbar lordotic curve vs sacral sitting.   [facility] Adult PT Treatment:                                                DATE:  02/19/23 Therapeutic Exercise: UBE  L2 2 min each way  Standing ITY GTB 90/90 KB carry 5# progressed to 10# Seated tricep press 20# 2 x 10 Seated chest press 15# 2 x 10  Free motion ROW 26# total using Bil handles  Standing shoulder Ext Blue 10 x 2  GTB retract and ER 10 x 2   Therapeutic Activity: Hip hinge- bending at his tables, pacing and activity modifications  PATIENT EDUCATION:  Education details: POC Explanation of findings, issue HEP  went over [employer] Person educated: Patient Education method: Explanation, Demonstration, Tactile cues, Verbal cues, and Handouts Education comprehension: verbalized understanding, returned demonstration, verbal cues required, tactile cues required, and needs further education  HOME EXERCISE PROGRAM: Access Code: [email or web address] URL: [email or web address] Date: 02/21/2023 Prepared by: Lulu Riding  Exercises - Shoulder External Rotation and Scapular Retraction with Resistance  - 2 x daily - 7 x weekly - 3 sets - 10 reps - Scapular Retraction with Resistance  - 2 x daily - 7 x weekly - 3 sets - 10 reps - Shoulder Extension with Resistance - Palms Forward  - 1 x daily - 7 x weekly - 3 sets - 10 reps - Seated Scapular Retraction  - 1 x daily - 7 x weekly - 3 sets - 10 reps - Supine Cervical Retraction with Towel  - 1 x daily - 7 x weekly - 1 sets - 10 reps - Seated Levator Scapulae Stretch  - 1 x daily - 7 x weekly - 1 sets - 3 reps - 30 hold - Seated Cervical Sidebending Stretch  - 1 x daily - 7 x weekly - 1 sets - 3 reps - 30 hold - Doorway Pec Stretch at 90 Degrees Abduction  - 1 x daily - 7 x weekly - 3 sets - 10 reps - Seated Anterior Pelvic Tilt  - 1 x daily - 7 x weekly - 2 sets - 10 reps - 5 seconds hold Added 03-05-23 Thoracic Mobilization on Foam Roll - Hands Clasped  - 1 x daily - 7 x weekly - 1 sets - 15 reps ASSESSMENT:  CLINICAL IMPRESSION: 03-05-23 Mr Sciuto arrives to session reporting feeling 1/10 pain and continues  to have limitations in extending neck.  Pt with end hard feel especially with extension and followed with lateral and rotational motion. Pt demonstrates increased DNF strength and able to hold for 60 sec ( norm 37 sec).  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  Pt consented to TPDN  and was closely monitored throughout session without e stim this week followed with STW techniques and mobilizations within limited range and exercise. REviewed body mechanics and posture for work. Pt reports decreased tension and pain after session. Pt will review HEP next session and be DC'd.   EVAL: Patient is a 72 y.o. male who was seen today for physical therapy evaluation and treatment for cervical spondylosis and pain central to cervical spine but radiating into bil neck.  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   OBJECTIVE IMPAIRMENTS: decreased ROM, decreased strength, impaired flexibility, impaired UE functional use, postural dysfunction, and pain.   ACTIVITY LIMITATIONS: carrying, lifting, bending, sleeping, reach over head, and carrying heavy items for work  PARTICIPATION LIMITATIONS: driving, community activity, and occupation  PERSONAL FACTORS: THA L, head/neck oropharyngeal CA remission 13 years, HTN, HBP, Hypothyroidism, back surgery 2024 January for L-4/L5 are also affecting patient's functional outcome.   REHAB POTENTIAL: Good  CLINICAL DECISION MAKING: Evolving/moderate complexity  EVALUATION COMPLEXITY: Moderate   GOALS: Goals reviewed with patient? No  SHORT TERM GOALS: Target date: 02-22-23  Pt will be independent with initial HEP Baseline: limited knowledge Goal status: MET  2.  Pt pain with functional activities reduced to 5/10 Baseline: 10/10 with carrying stained glass pieces 35 to 40 # 03-05-23  1/10 pain Goal status: MET  3.  Demonstrate  understanding of proper sitting posture, body mechanics, work ergonomics, and be more conscious of position and posture throughout the day.  Baseline: needs education 02/19/23: discussed hip hinge for work at his various table 03-05-23  reviewed posture and body mechanics Goal status: MET   LONG TERM GOALS: Target date: 03-22-23  Pt will be independent with advanced HEP  Baseline: limited knowledge Goal status: ONGOING  2.  Demonstrate and verbalize techniques to reduce the risk of re-injury including: lifting, posture, body mechanics especially for work items like stained glass art Baseline: difficulty carrying without increasing pain to 10/10 03-05-23  reviewed posture and body mechanics pain 1/10 Goal status: MET  3.  Improved cervical rotation or lumbar compenstation to allow checking blind spots while driving with minimal discomfort Baseline: Pt with limited left cervical rotation  03-05-23  See AROM  minimal improvement Goal status: Partially met  4.  Pt will be able to lift at least 35 # without exacerbating pain in neck using good body mechanics Baseline: Pt avoiding lifting due to 10/10 pain 03-01-23  Jimmye Norman carry of 45 #   Goal status: MET  5.  FOTO will improve from 50%   to  62%   indicating improved functional mobility.  Baseline: 50% 03-05-23 59% Goal status: Partially Met  6.  Pain will decrease to 3/10 with all functional activities and pt will be able to verbalize pain management strategies to implement Baseline: 10/10 03-05-23  1/10 pain Goal status: MET  7. Pt will be able to perform DNF supine test and improve to at least 30 sec to show increased DNF strength  Baseline 15 sec  03-05-23 60 sec  Goal status:MET   PLAN:  PT FREQUENCY: 1-2x/week  PT DURATION: 8 weeks  PLANNED INTERVENTIONS: 97164- PT Re-evaluation, 97110-Therapeutic exercises, 97530- Therapeutic activity, 97112- Neuromuscular re-education, 97535- Self Care, 57846- Manual therapy,  96295- Electrical stimulation (manual), Patient/Family education, Dry Needling, Joint mobilization, Spinal mobilization, Cryotherapy, and Moist heat  PLAN FOR NEXT SESSION: TPDN and manual and progressive strengthening, core  Garen Lah, PT, A[employer] Certified Exercise Expert for the Aging Adult  03/05/23 11:24 AM Phone: [phone] Fax: [phone]   Garen Lah, PT, A[employer] Certified Exercise Expert for the Aging Adult  05/31/23 9:54 AM Phone: [phone] Fax: [phone]

## 2023-03-05 ENCOUNTER — Ambulatory Visit: Payer: Medicare HMO | Admitting: Physical Therapy

## 2023-03-05 ENCOUNTER — Encounter: Payer: Self-pay | Admitting: Physical Therapy

## 2023-03-05 ENCOUNTER — Other Ambulatory Visit (HOSPITAL_COMMUNITY): Payer: Self-pay | Admitting: Neurological Surgery

## 2023-03-05 DIAGNOSIS — M542 Cervicalgia: Secondary | ICD-10-CM | POA: Diagnosis not present

## 2023-03-05 DIAGNOSIS — R293 Abnormal posture: Secondary | ICD-10-CM

## 2023-03-05 DIAGNOSIS — M5416 Radiculopathy, lumbar region: Secondary | ICD-10-CM

## 2023-03-05 DIAGNOSIS — M6281 Muscle weakness (generalized): Secondary | ICD-10-CM | POA: Diagnosis not present

## 2023-03-05 NOTE — Patient Instructions (Signed)
Sleeping on Back  Place pillow under knees. A pillow with cervical support and a roll around waist are also helpful. Copyright  VHI. All rights reserved.  Sleeping on Side Place pillow between knees. Use cervical support under neck and a roll around waist as needed. Copyright  VHI. All rights reserved.   Sleeping on Stomach   If this is the only desirable sleeping position, place pillow under lower legs, and under stomach or chest as needed.  Posture - Sitting   Sit upright, head facing forward. Try using a roll to support lower back. Keep shoulders relaxed, and avoid rounded back. Keep hips level with knees. Avoid crossing legs for long periods. Stand to Sit / Sit to Stand   To sit: Bend knees to lower self onto front edge of chair, then scoot back on seat. To stand: Reverse sequence by placing one foot forward, and scoot to front of seat. Use rocking motion to stand up.   Work Height and Reach  Ideal work height is no more than 2 to 4 inches below elbow level when standing, and at elbow level when sitting. Reaching should be limited to arm's length, with elbows slightly bent.  Bending  Bend at hips and knees, not back. Keep feet shoulder-width apart.    Posture - Standing   Good posture is important. Avoid slouching and forward head thrust. Maintain curve in low back and align ears over shoul- ders, hips over ankles.  Alternating Positions   Alternate tasks and change positions frequently to reduce fatigue and muscle tension. Take rest breaks. Computer Work   Position work to Art gallery manager. Use proper work and seat height. Keep shoulders back and down, wrists straight, and elbows at right angles. Use chair that provides full back support. Add footrest and lumbar roll as needed.  Getting Into / Out of Car  Lower self onto seat, scoot back, then bring in one leg at a time. Reverse sequence to get out.  Dressing  Lie on back to pull socks or slacks over feet, or sit  and bend leg while keeping back straight.    Housework - Sink  Place one foot on ledge of cabinet under sink when standing at sink for prolonged periods.   Pushing / Pulling  Pushing is preferable to pulling. Keep back in proper alignment, and use leg muscles to do the work.  Deep Squat   Squat and lift with both arms held against upper trunk. Tighten stomach muscles without holding breath. Use smooth movements to avoid jerking.  Avoid Twisting   Avoid twisting or bending back. Pivot around using foot movements, and bend at knees if needed when reaching for articles.  Carrying Luggage   Distribute weight evenly on both sides. Use a cart whenever possible. Do not twist trunk. Move body as a unit.   Lifting Principles Maintain proper posture and head alignment. Slide object as close as possible before lifting. Move obstacles out of the way. Test before lifting; ask for help if too heavy. Tighten stomach muscles without holding breath. Use smooth movements; do not jerk. Use legs to do the work, and pivot with feet. Distribute the work load symmetrically and close to the center of trunk. Push instead of pull whenever possible.   Ask For Help   Ask for help and delegate to others when possible. Coordinate your movements when lifting together, and maintain the low back curve.  Log Roll   Lying on back, bend left knee and place left  arm across chest. Roll all in one movement to the right. Reverse to roll to the left. Always move as one unit. Housework - Sweeping  Use long-handled equipment to avoid stooping.   Housework - Wiping  Position yourself as close as possible to reach work surface. Avoid straining your back.  Laundry - Unloading Wash   To unload small items at bottom of washer, lift leg opposite to arm being used to reach.  Gardening - Raking  Move close to area to be raked. Use arm movements to do the work. Keep back straight and avoid twisting.      Cart  When reaching into cart with one arm, lift opposite leg to keep back straight.   Getting Into / Out of Bed  Lower self to lie down on one side by raising legs and lowering head at the same time. Use arms to assist moving without twisting. Bend both knees to roll onto back if desired. To sit up, start from lying on side, and use same move-ments in reverse. Housework - Vacuuming  Hold the vacuum with arm held at side. Step back and forth to move it, keeping head up. Avoid twisting.   Laundry - Armed forces training and education officer so that bending and twisting can be avoided.   Laundry - Unloading Dryer  Squat down to reach into clothes dryer or use a reacher.  Gardening - Weeding / Psychiatric nurse or Kneel. Knee pads may be helpful.                   Garen Lah, PT, ATRIC Certified Exercise Expert for the Aging Adult  03/05/23 11:04 AM Phone: [phone] Fax: [phone]

## 2023-03-07 ENCOUNTER — Ambulatory Visit: Payer: Medicare HMO | Admitting: Physical Therapy

## 2023-03-10 ENCOUNTER — Other Ambulatory Visit: Payer: Self-pay | Admitting: Internal Medicine

## 2023-03-10 DIAGNOSIS — E039 Hypothyroidism, unspecified: Secondary | ICD-10-CM

## 2023-03-20 ENCOUNTER — Other Ambulatory Visit: Payer: Medicare HMO

## 2023-03-21 DIAGNOSIS — Z8551 Personal history of malignant neoplasm of bladder: Secondary | ICD-10-CM | POA: Diagnosis not present

## 2023-03-22 ENCOUNTER — Other Ambulatory Visit: Payer: Self-pay | Admitting: Internal Medicine

## 2023-03-22 ENCOUNTER — Encounter: Payer: Self-pay | Admitting: Internal Medicine

## 2023-03-22 DIAGNOSIS — E039 Hypothyroidism, unspecified: Secondary | ICD-10-CM

## 2023-03-22 MED ORDER — LEVOTHYROXINE SODIUM 100 MCG PO TABS: 100.0000 ug | ORAL_TABLET | Freq: Every day | ORAL | 0 refills | Status: DC

## 2023-03-26 DIAGNOSIS — N400 Enlarged prostate without lower urinary tract symptoms: Secondary | ICD-10-CM | POA: Diagnosis not present

## 2023-03-26 DIAGNOSIS — N189 Chronic kidney disease, unspecified: Secondary | ICD-10-CM | POA: Diagnosis not present

## 2023-03-26 DIAGNOSIS — M4800 Spinal stenosis, site unspecified: Secondary | ICD-10-CM | POA: Diagnosis not present

## 2023-03-26 DIAGNOSIS — J309 Allergic rhinitis, unspecified: Secondary | ICD-10-CM | POA: Diagnosis not present

## 2023-03-26 DIAGNOSIS — M545 Low back pain, unspecified: Secondary | ICD-10-CM | POA: Diagnosis not present

## 2023-03-26 DIAGNOSIS — I130 Hypertensive heart and chronic kidney disease with heart failure and stage 1 through stage 4 chronic kidney disease, or unspecified chronic kidney disease: Secondary | ICD-10-CM | POA: Diagnosis not present

## 2023-03-26 DIAGNOSIS — E039 Hypothyroidism, unspecified: Secondary | ICD-10-CM | POA: Diagnosis not present

## 2023-03-26 DIAGNOSIS — Z008 Encounter for other general examination: Secondary | ICD-10-CM | POA: Diagnosis not present

## 2023-03-26 DIAGNOSIS — M1990 Unspecified osteoarthritis, unspecified site: Secondary | ICD-10-CM | POA: Diagnosis not present

## 2023-03-26 DIAGNOSIS — E785 Hyperlipidemia, unspecified: Secondary | ICD-10-CM | POA: Diagnosis not present

## 2023-03-26 DIAGNOSIS — K219 Gastro-esophageal reflux disease without esophagitis: Secondary | ICD-10-CM | POA: Diagnosis not present

## 2023-03-26 DIAGNOSIS — G250 Essential tremor: Secondary | ICD-10-CM | POA: Diagnosis not present

## 2023-03-26 DIAGNOSIS — I509 Heart failure, unspecified: Secondary | ICD-10-CM | POA: Diagnosis not present

## 2023-04-02 ENCOUNTER — Telehealth: Payer: Self-pay

## 2023-04-02 NOTE — Telephone Encounter (Signed)
Patient has been denied both MRI requests and [facility] is wanting X-rays before imaging

## 2023-04-03 ENCOUNTER — Telehealth: Payer: Self-pay | Admitting: Neurology

## 2023-04-03 ENCOUNTER — Telehealth: Payer: Self-pay

## 2023-04-03 ENCOUNTER — Other Ambulatory Visit: Payer: Self-pay

## 2023-04-03 DIAGNOSIS — M545 Low back pain, unspecified: Secondary | ICD-10-CM

## 2023-04-03 DIAGNOSIS — Z9889 Other specified postprocedural states: Secondary | ICD-10-CM

## 2023-04-03 NOTE — Telephone Encounter (Signed)
Pt's wife called in returning Chelsea's call

## 2023-04-03 NOTE — Telephone Encounter (Signed)
Error

## 2023-04-03 NOTE — Telephone Encounter (Signed)
Talked to patients wife and they are aware of the orders and will be going to get the X-rays done this week

## 2023-04-03 NOTE — Telephone Encounter (Signed)
X rays put in  Patient called both numbers left voicemail and orders sent to [facility]

## 2023-04-09 ENCOUNTER — Other Ambulatory Visit: Payer: Self-pay | Admitting: Internal Medicine

## 2023-04-09 DIAGNOSIS — F5101 Primary insomnia: Secondary | ICD-10-CM

## 2023-04-12 ENCOUNTER — Other Ambulatory Visit: Payer: Self-pay | Admitting: Internal Medicine

## 2023-04-12 DIAGNOSIS — F5101 Primary insomnia: Secondary | ICD-10-CM

## 2023-04-13 ENCOUNTER — Ambulatory Visit
Admission: RE | Admit: 2023-04-13 | Discharge: 2023-04-13 | Disposition: A | Discharge status: Home / Self Care | Payer: Medicare HMO | Source: Ambulatory Visit | Attending: Neurology | Admitting: Neurology

## 2023-04-13 DIAGNOSIS — M545 Low back pain, unspecified: Secondary | ICD-10-CM | POA: Diagnosis not present

## 2023-04-13 DIAGNOSIS — Z9889 Other specified postprocedural states: Secondary | ICD-10-CM

## 2023-05-09 ENCOUNTER — Other Ambulatory Visit: Payer: Self-pay | Admitting: Internal Medicine

## 2023-05-09 DIAGNOSIS — I10 Essential (primary) hypertension: Secondary | ICD-10-CM

## 2023-05-13 ENCOUNTER — Other Ambulatory Visit: Payer: Self-pay | Admitting: Internal Medicine

## 2023-05-13 DIAGNOSIS — E785 Hyperlipidemia, unspecified: Secondary | ICD-10-CM

## 2023-05-18 ENCOUNTER — Other Ambulatory Visit: Payer: Self-pay | Admitting: Internal Medicine

## 2023-05-18 DIAGNOSIS — I7121 Aneurysm of the ascending aorta, without rupture: Secondary | ICD-10-CM

## 2023-05-28 ENCOUNTER — Encounter: Payer: Self-pay | Admitting: Cardiology

## 2023-05-28 ENCOUNTER — Ambulatory Visit: Payer: Medicare HMO | Attending: Cardiology | Admitting: Cardiology

## 2023-05-28 VITALS — BP 130/82 | HR 68 | Ht 70.0 in | Wt 180.6 lb

## 2023-05-28 DIAGNOSIS — E785 Hyperlipidemia, unspecified: Secondary | ICD-10-CM | POA: Diagnosis not present

## 2023-05-28 DIAGNOSIS — I10 Essential (primary) hypertension: Secondary | ICD-10-CM

## 2023-05-28 DIAGNOSIS — I77819 Aortic ectasia, unspecified site: Secondary | ICD-10-CM

## 2023-05-28 DIAGNOSIS — I5189 Other ill-defined heart diseases: Secondary | ICD-10-CM

## 2023-05-28 DIAGNOSIS — R0989 Other specified symptoms and signs involving the circulatory and respiratory systems: Secondary | ICD-10-CM | POA: Diagnosis not present

## 2023-05-28 NOTE — Patient Instructions (Signed)
Medication Instructions:  The current medical regimen is effective;  continue present plan and medications.  *If you need a refill on your cardiac medications before your next appointment, please call your pharmacy*  Testing/Procedures: Your physician has requested that you have a carotid duplex. This test is an ultrasound of the carotid arteries in your neck. It looks at blood flow through these arteries that supply the brain with blood. Allow one hour for this exam. There are no restrictions or special instructions.  Follow-Up: At [facility], you and your health needs are our priority.  As part of our continuing mission to provide you with exceptional heart care, we have created designated Provider Care Teams.  These Care Teams include your primary Cardiologist (physician) and Advanced Practice Providers (APPs -  Physician Assistants and Nurse Practitioners) who all work together to provide you with the care you need, when you need it.  We recommend signing up for the patient portal called "MyChart".  Sign up information is provided on this After Visit Summary.  MyChart is used to connect with patients for Virtual Visits (Telemedicine).  Patients are able to view lab/test results, encounter notes, upcoming appointments, etc.  Non-urgent messages can be sent to your provider as well.   To learn more about what you can do with MyChart, go to [email or web address].    Your next appointment:   Follow up as needed with Dr Anne Fu

## 2023-05-28 NOTE — Progress Notes (Signed)
Cardiology Office Note:  .   Date:  05/28/2023  ID:  Brandon [name], DOB [birth year], MRN [id number] PCP: Etta Grandchild, MD  [facility] Providers Cardiologist:  None  Dr. Rennis Golden    History of Present Illness: .   Brandon [name] is a 73 y.o. male Discussed with the use of AI scribe   History of Present Illness   Brandon [name] "Brandon [name]" is a 73 year old male with diastolic heart failure who presents for cardiology evaluation. He was referred by Dr. Yetta Barre for evaluation of diastolic heart failure.  He has diastolic heart failure with an echocardiogram showing grade two diastolic dysfunction, indicating effective blood pumping but difficulty in heart relaxation, leading to increased pressures. Despite this, he experiences no significant symptoms and maintains an active lifestyle, regularly walking two to three miles and planning a trip to [place]. His current medications include bisoprolol 5 mg and candesartan 32 mg. He has discontinued aspirin and gabapentin. No chest pain or significant shortness of breath, though he experiences mild exertional dyspnea when walking long distances.  He has a history of an aortic aneurysm, monitored with annual CT scans. The most recent echocardiogram suggested an aortic measurement of 4.5 cm, while previous CT scans consistently showed 4.1 cm. Regular CT scans over the past few years have shown stability in the aneurysm's size.  He has chronic kidney disease stage 3A, with a creatinine level of 1.29, and reports no significant changes in kidney function.  His past medical history includes bladder cancer, treated successfully, and neck cancer, for which he received 35 radiation treatments. He experiences neck stiffness and limited range of motion, attributed to radiation treatment, and is receiving dry needling therapy to alleviate these symptoms.          ROS: No CP  Studies Reviewed: .        Results   LABS LDL: 54 Creatinine:  1.29  RADIOLOGY Aortic CT: Aortic diameter 4.1 cm (February 2024)  DIAGNOSTIC Echocardiogram: Diastolic heart failure, grade 2 Echocardiogram: Aortic diameter 4.5 cm     Risk Assessment/Calculations:           Physical Exam:   VS:  BP 130/82   Pulse 68   Ht 5\' 10"  (1.778 m)   Wt 180 lb 9.6 oz (81.9 kg)   SpO2 96%   BMI 25.91 kg/m    Wt Readings from Last 3 Encounters:  05/28/23 180 lb 9.6 oz (81.9 kg)  02/13/23 183 lb 3.2 oz (83.1 kg)  01/09/23 179 lb (81.2 kg)    GEN: Well nourished, well developed in no acute distress NECK: No JVD; Thick neck, radiation changes from squamous cell CA tx. Mild carotid bruits CARDIAC: RRR, no murmurs, no rubs, no gallops RESPIRATORY:  Clear to auscultation without rales, wheezing or rhonchi  ABDOMEN: Soft, non-tender, non-distended EXTREMITIES:  No edema; No deformity   ASSESSMENT AND PLAN: .    Assessment and Plan    Diastolic Heart Failure (Grade 2) Echocardiogram revealed diastolic heart failure with heart stiffness during relaxation, leading to higher pressures and potential atrial enlargement. Asymptomatic with no significant dyspnea or fluid retention. Managed similarly to hypertension with focus on blood pressure control, low salt intake, fluid management, and regular exercise. Explained difference from systolic heart failure and minimal impact on life expectancy. Emphasized lifestyle modifications and regular monitoring. - Continue bisoprolol 5 mg, candesartan 32 mg, rosuvastatin 20 mg - Maintain low salt diet - Monitor fluid intake - Encourage regular exercise  and walking  Aortic Aneurysm Stable aortic aneurysm, previously 4.1 cm on CT, recent echocardiogram suggested 4.5 cm. CT scans are more accurate. Annual CT scans recommended. Discussed regular monitoring and potential surgical intervention if significant changes detected. Explained CT scan reliability over echocardiogram for aneurysm size. - Order annual aortic CT scan -  Consider vascular surgeon referral if CT shows significant changes  Chronic Kidney Disease (Stage 3A) Creatinine level 1.29, indicating mild chronic kidney disease. Condition is well-managed and does not currently require additional intervention. - Monitor kidney function regularly  Post-Radiation Neck Tightness Experiences neck tightness and limited range of motion post-radiation therapy for cancer. Dry needling therapy used to alleviate symptoms. Discussed potential radiation-induced arterial changes and need for further assessment. - Order carotid doppler ultrasound to assess for radiation-induced arterial changes - Continue dry needling therapy  General Health Maintenance Advised to continue current medications and lifestyle modifications. Discussed risks of aspirin use, including bleeding, and lack of significant coronary calcification or plaque, making discontinuation reasonable. - Discontinue aspirin - Continue bisoprolol 5 mg, candesartan 32 mg, rosuvastatin 20 mg - Encourage regular exercise and walking - Maintain low salt diet  Follow-up - Follow up with primary care physician as needed - Return to cardiologist if new symptoms arise or if advised by primary care physician.               Signed, Donato Schultz, MD

## 2023-06-18 ENCOUNTER — Other Ambulatory Visit: Payer: Self-pay | Admitting: Internal Medicine

## 2023-06-18 DIAGNOSIS — E039 Hypothyroidism, unspecified: Secondary | ICD-10-CM

## 2023-06-25 ENCOUNTER — Encounter (HOSPITAL_COMMUNITY): Payer: Medicare HMO

## 2023-06-26 ENCOUNTER — Ambulatory Visit (HOSPITAL_BASED_OUTPATIENT_CLINIC_OR_DEPARTMENT_OTHER)
Admission: RE | Admit: 2023-06-26 | Discharge: 2023-06-26 | Disposition: A | Discharge status: Home / Self Care | Payer: Medicare HMO | Source: Ambulatory Visit | Attending: Internal Medicine | Admitting: Internal Medicine

## 2023-06-26 DIAGNOSIS — I7121 Aneurysm of the ascending aorta, without rupture: Secondary | ICD-10-CM

## 2023-06-26 DIAGNOSIS — N281 Cyst of kidney, acquired: Secondary | ICD-10-CM | POA: Diagnosis not present

## 2023-06-26 DIAGNOSIS — Z01818 Encounter for other preprocedural examination: Secondary | ICD-10-CM | POA: Diagnosis not present

## 2023-06-26 MED ORDER — IOHEXOL 350 MG/ML SOLN
100.0000 mL | Freq: Once | INTRAVENOUS | Status: AC | PRN
  Administered 2023-06-26: 75 mL via INTRAVENOUS

## 2023-06-27 ENCOUNTER — Ambulatory Visit (HOSPITAL_COMMUNITY)
Admission: RE | Admit: 2023-06-27 | Discharge: 2023-06-27 | Disposition: A | Discharge status: Home / Self Care | Payer: Medicare HMO | Source: Ambulatory Visit | Attending: Cardiology | Admitting: Cardiology

## 2023-06-27 DIAGNOSIS — R0989 Other specified symptoms and signs involving the circulatory and respiratory systems: Secondary | ICD-10-CM | POA: Insufficient documentation

## 2023-06-29 ENCOUNTER — Other Ambulatory Visit: Payer: Self-pay

## 2023-06-29 ENCOUNTER — Encounter: Payer: Self-pay | Admitting: Cardiology

## 2023-06-29 MED ORDER — ASPIRIN 81 MG PO TBEC: 81.0000 mg | DELAYED_RELEASE_TABLET | Freq: Every day | ORAL | Status: AC

## 2023-07-10 ENCOUNTER — Encounter: Payer: Self-pay | Admitting: Internal Medicine

## 2023-07-12 ENCOUNTER — Other Ambulatory Visit: Payer: Self-pay | Admitting: Internal Medicine

## 2023-07-12 DIAGNOSIS — F5101 Primary insomnia: Secondary | ICD-10-CM

## 2023-07-12 DIAGNOSIS — K219 Gastro-esophageal reflux disease without esophagitis: Secondary | ICD-10-CM

## 2023-07-13 MED ORDER — OMEPRAZOLE 40 MG PO CPDR: 40.0000 mg | DELAYED_RELEASE_CAPSULE | Freq: Every day | ORAL | 0 refills | Status: DC

## 2023-07-13 MED ORDER — ZOLPIDEM TARTRATE 10 MG PO TABS: 10.0000 mg | ORAL_TABLET | Freq: Every evening | ORAL | 0 refills | Status: DC | PRN

## 2023-07-18 ENCOUNTER — Encounter: Payer: Self-pay | Admitting: Internal Medicine

## 2023-07-19 ENCOUNTER — Ambulatory Visit: Admitting: Internal Medicine

## 2023-07-19 ENCOUNTER — Encounter: Payer: Self-pay | Admitting: Internal Medicine

## 2023-07-19 VITALS — BP 162/94 | HR 70 | Temp 98.7°F | Resp 16 | Ht 70.0 in | Wt 176.2 lb

## 2023-07-19 DIAGNOSIS — Z77011 Contact with and (suspected) exposure to lead: Secondary | ICD-10-CM | POA: Diagnosis not present

## 2023-07-19 DIAGNOSIS — M1A0720 Idiopathic chronic gout, left ankle and foot, without tophus (tophi): Secondary | ICD-10-CM | POA: Diagnosis not present

## 2023-07-19 DIAGNOSIS — N1831 Chronic kidney disease, stage 3a: Secondary | ICD-10-CM | POA: Diagnosis not present

## 2023-07-19 DIAGNOSIS — J309 Allergic rhinitis, unspecified: Secondary | ICD-10-CM

## 2023-07-19 DIAGNOSIS — F1010 Alcohol abuse, uncomplicated: Secondary | ICD-10-CM | POA: Diagnosis not present

## 2023-07-19 DIAGNOSIS — I10 Essential (primary) hypertension: Secondary | ICD-10-CM

## 2023-07-19 DIAGNOSIS — R0609 Other forms of dyspnea: Secondary | ICD-10-CM

## 2023-07-19 DIAGNOSIS — E039 Hypothyroidism, unspecified: Secondary | ICD-10-CM | POA: Diagnosis not present

## 2023-07-19 DIAGNOSIS — N281 Cyst of kidney, acquired: Secondary | ICD-10-CM

## 2023-07-19 DIAGNOSIS — N400 Enlarged prostate without lower urinary tract symptoms: Secondary | ICD-10-CM | POA: Diagnosis not present

## 2023-07-19 DIAGNOSIS — E785 Hyperlipidemia, unspecified: Secondary | ICD-10-CM | POA: Diagnosis not present

## 2023-07-19 DIAGNOSIS — Z0001 Encounter for general adult medical examination with abnormal findings: Secondary | ICD-10-CM

## 2023-07-19 DIAGNOSIS — E519 Thiamine deficiency, unspecified: Secondary | ICD-10-CM

## 2023-07-19 DIAGNOSIS — I5032 Chronic diastolic (congestive) heart failure: Secondary | ICD-10-CM

## 2023-07-19 DIAGNOSIS — R7989 Other specified abnormal findings of blood chemistry: Secondary | ICD-10-CM | POA: Insufficient documentation

## 2023-07-19 LAB — URINALYSIS, ROUTINE W REFLEX MICROSCOPIC
Bilirubin Urine: NEGATIVE
Hgb urine dipstick: NEGATIVE
Leukocytes,Ua: NEGATIVE
Nitrite: NEGATIVE
RBC / HPF: NONE SEEN (ref 0–?)
Specific Gravity, Urine: >=1.03 — AB (ref 1.000–1.030)
Urine Glucose: NEGATIVE
Urobilinogen, UA: 0.2 (ref 0.0–1.0)
pH: 5.5 (ref 5.0–8.0)

## 2023-07-19 LAB — BASIC METABOLIC PANEL WITH GFR
BUN: 22 mg/dL (ref 6–23)
CO2: 26 meq/L (ref 19–32)
Calcium: 9 mg/dL (ref 8.4–10.5)
Chloride: 104 meq/L (ref 96–112)
Creatinine, Ser: 1.2 mg/dL (ref 0.40–1.50)
GFR: 60.94 mL/min (ref >60.00)
Glucose, Bld: 102 mg/dL — ABNORMAL HIGH (ref 70–99)
Potassium: 3.9 meq/L (ref 3.5–5.1)
Sodium: 141 meq/L (ref 135–145)

## 2023-07-19 LAB — CBC WITH DIFFERENTIAL/PLATELET
Basophils Absolute: 0 10*3/uL (ref 0.0–0.1)
Basophils Relative: 0.7 % (ref 0.0–3.0)
Eosinophils Absolute: 0.1 10*3/uL (ref 0.0–0.7)
Eosinophils Relative: 0.9 % (ref 0.0–5.0)
HCT: 42.7 % (ref 39.0–52.0)
Hemoglobin: 13.8 g/dL (ref 13.0–17.0)
Lymphocytes Relative: 9.8 % — ABNORMAL LOW (ref 12.0–46.0)
Lymphs Abs: 0.6 10*3/uL — ABNORMAL LOW (ref 0.7–4.0)
MCHC: 32.2 g/dL (ref 30.0–36.0)
MCV: 86.7 fl (ref 78.0–100.0)
Monocytes Absolute: 0.5 10*3/uL (ref 0.1–1.0)
Monocytes Relative: 7.9 % (ref 3.0–12.0)
Neutro Abs: 4.9 10*3/uL (ref 1.4–7.7)
Neutrophils Relative %: 80.7 % — ABNORMAL HIGH (ref 43.0–77.0)
Platelets: 299 10*3/uL (ref 150.0–400.0)
RBC: 4.93 Mil/uL (ref 4.22–5.81)
RDW: 18.6 % — ABNORMAL HIGH (ref 11.5–15.5)
WBC: 6.1 10*3/uL (ref 4.0–10.5)

## 2023-07-19 LAB — TROPONIN I (HIGH SENSITIVITY): High Sens Troponin I: 9 ng/L (ref 2–17)

## 2023-07-19 LAB — HEPATIC FUNCTION PANEL
ALT: 18 U/L (ref 0–53)
AST: 30 U/L (ref 0–37)
Albumin: 4.3 g/dL (ref 3.5–5.2)
Alkaline Phosphatase: 91 U/L (ref 39–117)
Bilirubin, Direct: 0.1 mg/dL (ref 0.0–0.3)
Total Bilirubin: 0.4 mg/dL (ref 0.2–1.2)
Total Protein: 6.6 g/dL (ref 6.0–8.3)

## 2023-07-19 LAB — TSH: TSH: 1.67 u[IU]/mL (ref 0.35–5.50)

## 2023-07-19 LAB — LIPID PANEL
Cholesterol: 151 mg/dL (ref 0–200)
HDL: 88.1 mg/dL (ref >39.00)
LDL Cholesterol: 47 mg/dL (ref 0–99)
NonHDL: 62.9
Total CHOL/HDL Ratio: 2
Triglycerides: 78 mg/dL (ref 0.0–149.0)
VLDL: 15.6 mg/dL (ref 0.0–40.0)

## 2023-07-19 LAB — PSA: PSA: 1.86 ng/mL (ref 0.10–4.00)

## 2023-07-19 LAB — URIC ACID: Uric Acid, Serum: 3.5 mg/dL — ABNORMAL LOW (ref 4.0–7.8)

## 2023-07-19 LAB — PROTIME-INR
INR: 1.1 ratio — ABNORMAL HIGH (ref 0.8–1.0)
Prothrombin Time: 11.5 s (ref 9.6–13.1)

## 2023-07-19 LAB — BRAIN NATRIURETIC PEPTIDE: Pro B Natriuretic peptide (BNP): 69 pg/mL (ref 0.0–100.0)

## 2023-07-19 LAB — MAGNESIUM: Magnesium: 1.1 mg/dL — ABNORMAL LOW (ref 1.5–2.5)

## 2023-07-19 MED ORDER — INDAPAMIDE 1.25 MG PO TABS: 1.2500 mg | ORAL_TABLET | Freq: Every day | ORAL | 0 refills | Status: DC

## 2023-07-19 MED ORDER — MAGNESIUM OXIDE -MG SUPPLEMENT 400 (240 MG) MG PO TABS
400.0000 mg | ORAL_TABLET | Freq: Every day | ORAL | 1 refills | Status: DC
Start: 2023-07-19 — End: 2023-12-26

## 2023-07-19 MED ORDER — VITAMIN B-1 50 MG PO TABS: 50.0000 mg | ORAL_TABLET | Freq: Every day | ORAL | 1 refills | Status: AC

## 2023-07-19 MED ORDER — FLUTICASONE PROPIONATE 50 MCG/ACT NA SUSP: 2.0000 | Freq: Every day | NASAL | 0 refills | Status: DC

## 2023-07-19 NOTE — Patient Instructions (Signed)
 Health Maintenance, Male  Adopting a healthy lifestyle and getting preventive care are important in promoting health and wellness. Ask your health care provider about:  The right schedule for you to have regular tests and exams.  Things you can do on your own to prevent diseases and keep yourself healthy.  What should I know about diet, weight, and exercise?  Eat a healthy diet    Eat a diet that includes plenty of vegetables, fruits, low-fat dairy products, and lean protein.  Do not eat a lot of foods that are high in solid fats, added sugars, or sodium.  Maintain a healthy weight  Body mass index (BMI) is a measurement that can be used to identify possible weight problems. It estimates body fat based on height and weight. Your health care provider can help determine your BMI and help you achieve or maintain a healthy weight.  Get regular exercise  Get regular exercise. This is one of the most important things you can do for your health. Most adults should:  Exercise for at least 150 minutes each week. The exercise should increase your heart rate and make you sweat (moderate-intensity exercise).  Do strengthening exercises at least twice a week. This is in addition to the moderate-intensity exercise.  Spend less time sitting. Even light physical activity can be beneficial.  Watch cholesterol and blood lipids  Have your blood tested for lipids and cholesterol at 72 years of age, then have this test every 5 years.  You may need to have your cholesterol levels checked more often if:  Your lipid or cholesterol levels are high.  You are older than 72 years of age.  You are at high risk for heart disease.  What should I know about cancer screening?  Many types of cancers can be detected early and may often be prevented. Depending on your health history and family history, you may need to have cancer screening at various ages. This may include screening for:  Colorectal cancer.  Prostate cancer.  Skin cancer.  Lung  cancer.  What should I know about heart disease, diabetes, and high blood pressure?  Blood pressure and heart disease  High blood pressure causes heart disease and increases the risk of stroke. This is more likely to develop in people who have high blood pressure readings or are overweight.  Talk with your health care provider about your target blood pressure readings.  Have your blood pressure checked:  Every 3-5 years if you are 9-95 years of age.  Every year if you are 85 years old or older.  If you are between the ages of 29 and 29 and are a current or former smoker, ask your health care provider if you should have a one-time screening for abdominal aortic aneurysm (AAA).  Diabetes  Have regular diabetes screenings. This checks your fasting blood sugar level. Have the screening done:  Once every three years after age 23 if you are at a normal weight and have a low risk for diabetes.  More often and at a younger age if you are overweight or have a high risk for diabetes.  What should I know about preventing infection?  Hepatitis B  If you have a higher risk for hepatitis B, you should be screened for this virus. Talk with your health care provider to find out if you are at risk for hepatitis B infection.  Hepatitis C  Blood testing is recommended for:  Everyone born from [birth year] through 1964.  Anyone  with known risk factors for hepatitis C.  Sexually transmitted infections (STIs)  You should be screened each year for STIs, including gonorrhea and chlamydia, if:  You are sexually active and are younger than 72 years of age.  You are older than 72 years of age and your health care provider tells you that you are at risk for this type of infection.  Your sexual activity has changed since you were last screened, and you are at increased risk for chlamydia or gonorrhea. Ask your health care provider if you are at risk.  Ask your health care provider about whether you are at high risk for HIV. Your health care provider  may recommend a prescription medicine to help prevent HIV infection. If you choose to take medicine to prevent HIV, you should first get tested for HIV. You should then be tested every 3 months for as long as you are taking the medicine.  Follow these instructions at home:  Alcohol use  Do not drink alcohol if your health care provider tells you not to drink.  If you drink alcohol:  Limit how much you have to 0-2 drinks a day.  Know how much alcohol is in your drink. In the [place] one drink equals one 12 oz bottle of beer (355 mL), one 5 oz glass of wine (148 mL), or one 1 oz glass of hard liquor (44 mL).  Lifestyle  Do not use any products that contain nicotine or tobacco. These products include cigarettes, chewing tobacco, and vaping devices, such as e-cigarettes. If you need help quitting, ask your health care provider.  Do not use street drugs.  Do not share needles.  Ask your health care provider for help if you need support or information about quitting drugs.  General instructions  Schedule regular health, dental, and eye exams.  Stay current with your vaccines.  Tell your health care provider if:  You often feel depressed.  You have ever been abused or do not feel safe at home.  Summary  Adopting a healthy lifestyle and getting preventive care are important in promoting health and wellness.  Follow your health care provider's instructions about healthy diet, exercising, and getting tested or screened for diseases.  Follow your health care provider's instructions on monitoring your cholesterol and blood pressure.  This information is not intended to replace advice given to you by your health care provider. Make sure you discuss any questions you have with your health care provider.  Document Revised: 08/16/2020 Document Reviewed: 08/16/2020  Elsevier Patient Education  2024 [employer].

## 2023-07-19 NOTE — Progress Notes (Unsigned)
 Subjective:  Patient ID: Brandon [name], male    DOB: 08-27-1950  Age: 73 y.o. MRN: [id number]  CC: Annual Exam, Hypertension, Hyperlipidemia, and Hypothyroidism   HPI Brandon [name] presents for a CPX and f/up ----  Discussed the use of AI scribe software for clinical note transcription with the patient, who gave verbal consent to proceed.  History of Present Illness   Brandon [name] "Skip" is a 73 year old male who presents for a head to toe checkup.  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 No regular shortness of breath, headaches, blurred vision, or chest pain.  He describes nasal congestion with mucus but no runny nose or nasal pain. He uses a nasal machine before bed, which helps him breathe better. No epistaxis.  He recently returned from a trip to [place], which involved a lot of walking, but he denies any pain or swelling in his legs.       Outpatient Medications Prior to Visit  Medication Sig Dispense Refill   allopurinol (ZYLOPRIM) 300 MG tablet Take 1 tablet (300 mg total) by mouth daily. 90 tablet 3   aspirin EC 81 MG tablet Take 1 tablet (81 mg total) by mouth daily. Swallow whole.     bisoprolol (ZEBETA) 5 MG tablet TAKE 1 TABLET (5 MG TOTAL) BY MOUTH DAILY. 90 tablet 1   candesartan (ATACAND) 32 MG tablet TAKE 1 TABLET BY MOUTH EVERY DAY 90 tablet 0   colchicine 0.6 MG tablet Take 1 tablet (0.6 mg total) by mouth daily as needed (gout or psuedogout pain). 30 tablet 2   levothyroxine (SYNTHROID) 100 MCG tablet TAKE 1 TABLET BY MOUTH EVERY DAY 90 tablet 0   omeprazole (PRILOSEC) 40 MG capsule Take 1 capsule (40 mg total) by mouth daily. 90 capsule 0   primidone (MYSOLINE) 50 MG tablet Take 1 tablet (50 mg total) by mouth 2 (two) times daily. 180 tablet 1   rosuvastatin (CRESTOR) 20 MG tablet TAKE 1 TABLET BY MOUTH EVERY DAY 90 tablet 0   zolpidem (AMBIEN) 10 MG tablet Take 1 tablet (10 mg total)  by mouth at bedtime as needed. for sleep 90 tablet 0   Magnesium Oxide 400 MG CAPS Take 1 capsule (400 mg total) by mouth in the morning and at bedtime. 90 capsule 1   No facility-administered medications prior to visit.    ROS Review of Systems  Constitutional: Negative.  Negative for appetite change, chills, diaphoresis, fatigue and fever.  HENT:  Positive for postnasal drip. Negative for ear discharge, ear pain, facial swelling, nosebleeds, rhinorrhea, sinus pressure, sinus pain, sneezing, sore throat, trouble swallowing and voice change.   Eyes: Negative.  Negative for visual disturbance.  Respiratory:  Positive for shortness of breath (DOE). Negative for choking, chest tightness and wheezing.   Cardiovascular:  Negative for chest pain, palpitations and leg swelling.  Gastrointestinal: Negative.  Negative for abdominal pain, constipation, diarrhea, nausea and vomiting.  Genitourinary: Negative.  Negative for difficulty urinating.  Musculoskeletal: Negative.  Negative for myalgias.  Neurological: Negative.  Negative for dizziness and weakness.  Psychiatric/Behavioral:  Positive for confusion and decreased concentration.     Objective:  BP (!) 162/94 (BP Location: Right Arm, Patient Position: Sitting, Cuff Size: Normal)   Pulse 70   Temp 98.7 F (37.1 C) (Oral)   Resp 16   Ht 5\' 10"  (1.778 m)   Wt 176 lb 3.2 oz (79.9 kg)  SpO2 99%   BMI 25.28 kg/m   BP Readings from Last 3 Encounters:  07/19/23 (!) 162/94  05/28/23 130/82  02/13/23 117/77    Wt Readings from Last 3 Encounters:  07/19/23 176 lb 3.2 oz (79.9 kg)  05/28/23 180 lb 9.6 oz (81.9 kg)  02/13/23 183 lb 3.2 oz (83.1 kg)    Physical Exam Vitals reviewed.  Constitutional:      Appearance: Normal appearance. He is not ill-appearing.  HENT:     Nose: Mucosal edema present. No congestion or rhinorrhea.     Right Nostril: No epistaxis.     Left Nostril: No epistaxis.     Right Turbinates: Pale.  Eyes:      General: No scleral icterus.    Conjunctiva/sclera: Conjunctivae normal.  Cardiovascular:     Rate and Rhythm: Normal rate and regular rhythm.     Heart sounds: No murmur heard.    No friction rub. No gallop.     Comments: EKG--- SR with 1st degree AV block, 60 bpm No LVH, Q waves, or ST/T wave changes  Unchanged  Pulmonary:     Effort: Pulmonary effort is normal.     Breath sounds: No stridor. No wheezing, rhonchi or rales.  Abdominal:     General: Abdomen is flat.     Palpations: There is no mass.     Tenderness: There is no abdominal tenderness. There is no guarding.     Hernia: No hernia is present. There is no hernia in the left inguinal area or right inguinal area.  Genitourinary:    Pubic Area: No rash.      Penis: Normal and uncircumcised.      Testes: Normal.     Epididymis:     Right: Normal.     Left: Normal.     Prostate: Enlarged. Not tender and no nodules present.     Rectum: Guaiac result negative. External hemorrhoid present. No mass, tenderness, anal fissure or internal hemorrhoid. Normal anal tone.  Musculoskeletal:        General: Normal range of motion.     Cervical back: Neck supple.     Right lower leg: No edema.     Left lower leg: No edema.  Lymphadenopathy:     Cervical: No cervical adenopathy.     Lower Body: No right inguinal adenopathy. No left inguinal adenopathy.  Skin:    General: Skin is warm and dry.  Neurological:     General: No focal deficit present.     Mental Status: He is alert. Mental status is at baseline.  Psychiatric:        Mood and Affect: Mood normal.        Behavior: Behavior normal.     Lab Results  Component Value Date   WBC 6.1 07/19/2023   HGB 13.8 07/19/2023   HCT 42.7 07/19/2023   PLT 299.0 07/19/2023   GLUCOSE 102 (H) 07/19/2023   CHOL 151 07/19/2023   TRIG 78.0 07/19/2023   HDL 88.10 07/19/2023   LDLDIRECT 147.7 12/03/2007   LDLCALC 47 07/19/2023   ALT 18 07/19/2023   AST 30 07/19/2023   NA 141  07/19/2023   K 3.9 07/19/2023   CL 104 07/19/2023   CREATININE 1.20 07/19/2023   BUN 22 07/19/2023   CO2 26 07/19/2023   TSH 1.67 07/19/2023   PSA 1.86 07/19/2023   INR 1.1 (H) 07/19/2023   HGBA1C 6.0 02/13/2023    VAS US CAROTID Result Date: 06/28/2023 Carotid  Arterial Duplex Study Patient Name:  Brandon [name]  Date of Exam:   06/27/2023 Medical Rec #: [id number]       Accession #:    [id number] Date of Birth: July 16, 1950      Patient Gender: M Patient Age:   67 years Exam Location:  [facility] Procedure:      VAS US CAROTID Referring Phys: Donato Schultz --------------------------------------------------------------------------------  Indications:       Bruit per order. Patient denies any cerebrovascular                    symptoms. Risk Factors:      Hypertension, hyperlipidemia, past history of smoking. Limitations        Today's exam was limited due to left-sided radiation. Comparison Study:  NA Performing Technologist: Tyna Jaksch RVT  Examination Guidelines: A complete evaluation includes B-mode imaging, spectral Doppler, color Doppler, and power Doppler as needed of all accessible portions of each vessel. Bilateral testing is considered an integral part of a complete examination. Limited examinations for reoccurring indications may be performed as noted.  Right Carotid Findings: +----------+--------+--------+--------+----------------------+----------+           PSV cm/sEDV cm/sStenosisPlaque Description    Comments   +----------+--------+--------+--------+----------------------+----------+ CCA Prox  116     21                                               +----------+--------+--------+--------+----------------------+----------+ CCA Distal34      11                                               +----------+--------+--------+--------+----------------------+----------+ ICA Prox  47      30      1-39%   heterogenous and focal            +----------+--------+--------+--------+----------------------+----------+ ICA Distal76      34                                    steep dive +----------+--------+--------+--------+----------------------+----------+ ECA       41      5                                                +----------+--------+--------+--------+----------------------+----------+ +----------+--------+-------+--------+-------------------+           PSV cm/sEDV cmsDescribeArm Pressure (mmHG) +----------+--------+-------+--------+-------------------+ [id number]                    130                 +----------+--------+-------+--------+-------------------+ +---------+--------+--------+--------------+ VertebralPSV cm/sEDV cm/sNot identified +---------+--------+--------+--------------+  Left Carotid Findings: +----------+--------+--------+--------+----------------------+----------+           PSV cm/sEDV cm/sStenosisPlaque Description    Comments   +----------+--------+--------+--------+----------------------+----------+ CCA Prox  121     23                                               +----------+--------+--------+--------+----------------------+----------+  CCA Distal48      14                                               +----------+--------+--------+--------+----------------------+----------+ ICA Prox  41      16      1-39%   heterogenous and focal           +----------+--------+--------+--------+----------------------+----------+ ICA Distal71      29                                    steep dive +----------+--------+--------+--------+----------------------+----------+ ECA       42      25              heterogenous                     +----------+--------+--------+--------+----------------------+----------+ +----------+--------+--------+----------------+-------------------+           PSV cm/sEDV cm/sDescribe        Arm Pressure (mmHG)  +----------+--------+--------+----------------+-------------------+ Subclavian129             Multiphasic, [id number]                 +----------+--------+--------+----------------+-------------------+ +---------+--------+--------+--------------+ VertebralPSV cm/sEDV cm/sNot identified +---------+--------+--------+--------------+   Summary: Right Carotid: Velocities in the right ICA are consistent with a 1-39% stenosis. Left Carotid: Velocities in the left ICA are consistent with a 1-39% stenosis. Vertebrals:  Bilateral vertebral arteries were not visualized. Subclavians: Normal flow hemodynamics were seen in bilateral subclavian              arteries. *See table(s) above for measurements and observations.  Electronically signed by Charlton Haws MD on 06/28/2023 at 1:47:26 PM.    Final     CT ANGIO CHEST AORTA W/CM & OR WO/CM Result Date: 07/10/2023 CLINICAL DATA:  Thoracic aortic disease. Preoperative planning. Follow-up thoracic aortic aneurysm. History of head and neck cancer. EXAM: CT ANGIOGRAPHY CHEST WITH CONTRAST TECHNIQUE: Multidetector CT imaging of the chest was performed using the standard protocol during bolus administration of intravenous contrast. Multiplanar CT image reconstructions and MIPs were obtained to evaluate the vascular anatomy. RADIATION DOSE REDUCTION: This exam was performed according to the departmental dose-optimization program which includes automated exposure control, adjustment of the mA and/or kV according to patient size and/or use of iterative reconstruction technique. CONTRAST:  75mL OMNIPAQUE IOHEXOL 350 MG/ML SOLN COMPARISON:  CTA chest 06/05/2022 FINDINGS: Cardiovascular: There is mild aortic tortuosity, mild calcific plaques in the arch and descending segment, minimal calcific plaques in the great vessels. There is no stenosis or dissection. Representative measurements of the aorta are as follows: Valve: 3.3 x 3.2 cm on 7:87 and 8:114; Sinuses: 4.1 x 3.6 cm on 7:87  and 8:114; Sinotubular junction: 2.9 x 2.9 cm on 7:90 and 8:108; Mid ascending aorta: 4.0 x 3.9 cm on 7:90 and 8:99, previously 4.1 cm; Arch: 3.2 x 2.7 cm on 4:48 and 7:79; Isthmus: 2.9 cm on 8:127; Proximal descending aorta: 2.5 cm on 8:126; Distal descending aorta: 2.4 cm on 8:126; Hiatal segment: 2.5 cm on 8:121. There is mild cardiomegaly. No pericardial effusion or venous dilatation. The pulmonary arteries are normal in caliber without evidence of arterial embolism through the segmental level. There are no appreciable or significant coronary calcifications. Mediastinum/Nodes: There are multiple normal  to borderline sized mediastinal lymph nodes, largest short axis lymph nodes measuring 8 mm in the right paratracheal space and 9 mm in the left-sided prevascular space. This was seen previously and is unchanged. No enlarged lymph nodes or progressive adenopathy are seen. Axillary spaces are clear. Thyroid gland is unremarkable. Thoracic trachea and thoracic aorta are unremarkable. Lungs/Pleura: There is chronic bilateral apical pleuroparenchymal scarring and bronchiolectasis. There is diffuse bronchial thickening which was also seen previously. There is mild elevation of the right hemidiaphragm. No consolidation, effusion or nodule is seen. Upper Abdomen: Stable large left upper pole renal cyst partially visible. No acute abnormality. Abdominal aortic atherosclerosis. Mild hepatic steatosis. Musculoskeletal: There are degenerative changes and multilevel bridging enthesopathy of the thoracic spine. No acute or other significant osseous findings. No chest wall mass is evident. Review of the MIP images confirms the above findings. IMPRESSION: 1. 4.0 cm mid ascending aortic aneurysm. Recommend annual imaging followup by CTA or MRA. This recommendation follows 2010 ACCF/AHA/AATS/ACR/ASA/SCA/SCAI/SIR/STS/SVM Guidelines for the Diagnosis and Management of Patients with Thoracic Aortic Disease. Circulation. 2010; 121:  U045-W098. Aortic aneurysm NOS (ICD10-I71.9) 2. Aortic atherosclerosis. 3. Mild cardiomegaly. 4. Chronic bronchial thickening and apical pleuroparenchymal scarring and bronchiolectasis. 5. Stable normal to borderline sized mediastinal lymph nodes. No progressive adenopathy. 6. Hepatic steatosis. Electronically Signed   By: Almira Bar M.D.   On: 07/10/2023 03:16   VAS US CAROTID Result Date: 06/28/2023 Carotid Arterial Duplex Study Patient Name:  Brandon [name]  Date of Exam:   06/27/2023 Medical Rec #: [id number]       Accession #:    [id number] Date of Birth: [birth year]      Patient Gender: M Patient Age:   70 years Exam Location:  [facility] Procedure:      VAS US CAROTID Referring Phys: Donato Schultz --------------------------------------------------------------------------------  Indications:       Bruit per order. Patient denies any cerebrovascular                    symptoms. Risk Factors:      Hypertension, hyperlipidemia, past history of smoking. Limitations        Today's exam was limited due to left-sided radiation. Comparison Study:  NA Performing Technologist: Tyna Jaksch RVT  Examination Guidelines: A complete evaluation includes B-mode imaging, spectral Doppler, color Doppler, and power Doppler as needed of all accessible portions of each vessel. Bilateral testing is considered an integral part of a complete examination. Limited examinations for reoccurring indications may be performed as noted.  Right Carotid Findings: +----------+--------+--------+--------+----------------------+----------+           PSV cm/sEDV cm/sStenosisPlaque Description    Comments   +----------+--------+--------+--------+----------------------+----------+ CCA Prox  116     21                                               +----------+--------+--------+--------+----------------------+----------+ CCA Distal34      11                                                +----------+--------+--------+--------+----------------------+----------+ ICA Prox  47      30      1-39%   heterogenous and focal           +----------+--------+--------+--------+----------------------+----------+  ICA Distal76      34                                    steep dive +----------+--------+--------+--------+----------------------+----------+ ECA       41      5                                                +----------+--------+--------+--------+----------------------+----------+ +----------+--------+-------+--------+-------------------+           PSV cm/sEDV cmsDescribeArm Pressure (mmHG) +----------+--------+-------+--------+-------------------+ [id number]                    130                 +----------+--------+-------+--------+-------------------+ +---------+--------+--------+--------------+ VertebralPSV cm/sEDV cm/sNot identified +---------+--------+--------+--------------+  Left Carotid Findings: +----------+--------+--------+--------+----------------------+----------+           PSV cm/sEDV cm/sStenosisPlaque Description    Comments   +----------+--------+--------+--------+----------------------+----------+ CCA Prox  121     23                                               +----------+--------+--------+--------+----------------------+----------+ CCA Distal48      14                                               +----------+--------+--------+--------+----------------------+----------+ ICA Prox  41      16      1-39%   heterogenous and focal           +----------+--------+--------+--------+----------------------+----------+ ICA Distal71      29                                    steep dive +----------+--------+--------+--------+----------------------+----------+ ECA       42      25              heterogenous                     +----------+--------+--------+--------+----------------------+----------+  +----------+--------+--------+----------------+-------------------+           PSV cm/sEDV cm/sDescribe        Arm Pressure (mmHG) +----------+--------+--------+----------------+-------------------+ Subclavian129             Multiphasic, [id number]                 +----------+--------+--------+----------------+-------------------+ +---------+--------+--------+--------------+ VertebralPSV cm/sEDV cm/sNot identified +---------+--------+--------+--------------+   Summary: Right Carotid: Velocities in the right ICA are consistent with a 1-39% stenosis. Left Carotid: Velocities in the left ICA are consistent with a 1-39% stenosis. Vertebrals:  Bilateral vertebral arteries were not visualized. Subclavians: Normal flow hemodynamics were seen in bilateral subclavian              arteries. *See table(s) above for measurements and observations.  Electronically signed by Charlton Haws MD on 06/28/2023 at 1:47:26 PM.    Final      Assessment & Plan:   DOE (dyspnea on exertion)- Will evaluate with a CA CT  scan. -     EKG 12-Lead -     Brain natriuretic peptide; Future -     Troponin I (High Sensitivity); Future -     CT CORONARY MORPH W/CTA COR W/SCORE W/CA W/CM &/OR WO/CM; Future  Renal cyst, left -     Basic metabolic panel with GFR; Future -     Urinalysis, Routine w reflex microscopic; Future  Hypomagnesemia -     Magnesium; Future -     Magnesium Oxide -Mg Supplement; Take 1 tablet (400 mg total) by mouth daily.  Dispense: 90 tablet; Refill: 1  Acquired hypothyroidism- He is euthyroid. -     TSH; Future  Encounter for general adult medical examination with abnormal findings- Exam completed, labs reviewed, vaccines reviewed, cancer screenings addressed, pt ed material was given.   Alcohol abuse, continuous -     Hepatic function panel; Future -     Protime-INR; Future -     Vitamin B-1; Take 1 tablet (50 mg total) by mouth daily.  Dispense: 90 tablet; Refill: 1  Hyperlipidemia with  target LDL less than 130- LDL goal achieved. Doing well on the statin  -     Lipid panel; Future  Chronic idiopathic gout involving toe of left foot without tophus -     Basic metabolic panel with GFR; Future -     Uric acid; Future  Stage 3a chronic kidney disease (HCC) -     Basic metabolic panel with GFR; Future  Essential hypertension, benign- EKG is negative for LVH. BP is not at gaol. -     Basic metabolic panel with GFR; Future -     CBC with Differential/Platelet; Future -     Indapamide; Take 1 tablet (1.25 mg total) by mouth daily.  Dispense: 90 tablet; Refill: 0  Chronic heart failure with preserved ejection fraction (HCC) -     Brain natriuretic peptide; Future -     Troponin I (High Sensitivity); Future  Lead exposure -     CBC with Differential/Platelet; Future -     Heavy Metals Profile, Urine; Future  Elevated LFTs -     Heavy Metals Profile, Urine; Future  Benign prostatic hyperplasia without lower urinary tract symptoms -     PSA; Future -     Urinalysis, Routine w reflex microscopic; Future  Allergic rhinitis, unspecified seasonality, unspecified trigger -     Fluticasone Propionate; Place 2 sprays into both nostrils daily.  Dispense: 48 g; Refill: 0  Thiamine deficiency -     Vitamin B-1; Take 1 tablet (50 mg total) by mouth daily.  Dispense: 90 tablet; Refill: 1     Follow-up: Return in about 3 months (around 10/18/2023).  Sanda Linger, MD

## 2023-07-20 ENCOUNTER — Encounter: Payer: Self-pay | Admitting: Internal Medicine

## 2023-07-23 ENCOUNTER — Telehealth: Payer: Self-pay

## 2023-07-23 NOTE — Telephone Encounter (Signed)
 I never received any information that PA doesn't even look like it was done. When will the Appeal need to be done and do I need to be on the look out for paperwork ?

## 2023-07-23 NOTE — Telephone Encounter (Signed)
 I received a denial letter for Zolpidem saying they denied it because the documentation wasn't submitted to them. Based off the message I saw between you and the patient, the office was going to do the prior auth. An appeal will have to be done for the drug. That is not something we can do because we are not the ones who initiated the prior auth. Any questions please let me know!

## 2023-07-25 ENCOUNTER — Other Ambulatory Visit (HOSPITAL_COMMUNITY): Payer: Self-pay

## 2023-07-25 NOTE — Telephone Encounter (Signed)
 What paper would I use to do the appeal? Just send in a paper on a letter head saying "I want to appeal a decision" or will they send me something for Dr. Rochelle Chu to sign?????????

## 2023-07-25 NOTE — Telephone Encounter (Signed)
 Typically the Dr would have to write a letter of intention to appeal the decision. You would probably have to attach chart notes to backup your claim for an appeal.

## 2023-07-26 NOTE — Telephone Encounter (Signed)
 Please type this letter.

## 2023-07-29 ENCOUNTER — Other Ambulatory Visit: Payer: Self-pay | Admitting: Internal Medicine

## 2023-07-31 ENCOUNTER — Other Ambulatory Visit: Payer: Self-pay | Admitting: Internal Medicine

## 2023-07-31 ENCOUNTER — Encounter: Payer: Self-pay | Admitting: Internal Medicine

## 2023-07-31 MED ORDER — BISOPROLOL FUMARATE 5 MG PO TABS: 5.0000 mg | ORAL_TABLET | Freq: Every day | ORAL | 0 refills | Status: DC

## 2023-07-31 NOTE — Telephone Encounter (Signed)
 Copied from CRM [id number]. Topic: Clinical - Prescription Issue >> Jul 31, 2023 11:52 AM Albertha Alosa wrote: Reason for CRM: [facility] Medicare Part B Prior Auth regarding prior auth for Zolpidem  would like a nurse to call back at  [phone]

## 2023-08-01 NOTE — Telephone Encounter (Signed)
 Per insurance, full denial will be uploaded to media tab.

## 2023-08-02 ENCOUNTER — Other Ambulatory Visit (HOSPITAL_COMMUNITY): Payer: Self-pay

## 2023-08-02 ENCOUNTER — Telehealth: Payer: Self-pay | Admitting: Internal Medicine

## 2023-08-02 NOTE — Telephone Encounter (Signed)
 Duplicate

## 2023-08-02 NOTE — Telephone Encounter (Signed)
 Called and spoke with the PA team and absolutely got his medication approved for him. Reached out to the patient and was unable to get him on the line. Did leave a very detailed message.

## 2023-08-02 NOTE — Telephone Encounter (Signed)
 PA has been done OTP and it was approved. Patient has been made aware. Waiting for them to send the approval message.

## 2023-08-02 NOTE — Telephone Encounter (Signed)
 This has been handled in the telephone encounter and patient is aware. Medication has been approved.

## 2023-08-02 NOTE — Telephone Encounter (Signed)
 Copied from CRM [id number]. Topic: General - Other >> Aug 02, 2023  3:02 PM Annelle Kiel wrote: Reason for CRM: patient is needing a pa for his medication zolpidem  (AMBIEN ) 10 MG tablet

## 2023-08-10 NOTE — Telephone Encounter (Signed)
 Patient has been able to get his medication.

## 2023-08-12 ENCOUNTER — Other Ambulatory Visit: Payer: Self-pay | Admitting: Neurology

## 2023-08-12 ENCOUNTER — Other Ambulatory Visit: Payer: Self-pay | Admitting: Internal Medicine

## 2023-08-12 DIAGNOSIS — I10 Essential (primary) hypertension: Secondary | ICD-10-CM

## 2023-08-12 DIAGNOSIS — E785 Hyperlipidemia, unspecified: Secondary | ICD-10-CM

## 2023-08-13 ENCOUNTER — Other Ambulatory Visit: Payer: Self-pay | Admitting: Internal Medicine

## 2023-08-13 DIAGNOSIS — F5101 Primary insomnia: Secondary | ICD-10-CM

## 2023-08-13 MED ORDER — ZOLPIDEM TARTRATE 10 MG PO TABS: 10.0000 mg | ORAL_TABLET | Freq: Every evening | ORAL | 0 refills | Status: DC | PRN

## 2023-08-13 NOTE — Progress Notes (Unsigned)
 Assessment/Plan:   1..Tremor             - Markedly improved on primidone , 50 mg twice per day.  Refills provided.             -Long discussion with the patient regarding the complex effect that alcohol can have on tremor.  Chronic alcohol use can produce a tremor but discontinuation of the alcohol can also cause a tremulous state for quite some time.  He and I discussed this again today             -Would not recommend beta-blocker given low pulse/low blood pressure, which is likely due in part to current antihypertensive therapy.     2.  Neck pain             -seeing neurosx now             -did PT and had dry needling and it was very helpful             -following Dr. Rochelle Chu with [facility]   3.  Propriospinal myoclonus  -improved compared to last visit but wife states that still present.  I did not see it today, but definitely sorry last visit.             - Attempted to order mri lumbar with and without previously open.  Has had prior back surgery) but insurance required that he have x-rays first.  Those have been done.             -mri thoracic previously denied by insurance until x-rays completed.  Those have been done.  - Now off of gabapentin              -renal insuff could contribute   4.  Peripheral neuropathy             -Has evidence of this on his examination, although patient states not bothersome.             -Patient has had chemotherapy in the past.  Alcohol likely a contributor as well.    5.  B12 deficiency  - Start injections at least for the first month and then we can transition to oral after that, 1000 mcg daily. Subjective:   JR KOVACICH was seen today in follow up regarding multiple complaints.  My previous records as well as any outside records available were reviewed prior to todays visit.  Pt is currently on primidone , 50 mg twice per day that was started last visit.  We also discussed how alcohol can play a role in tremor.  He reports today  that "my hands are much better."  Wife notes when he is stressed he gets more tremor.  His mom recently died and his wife noted more tremor then.    Separately, we ordered an MRI of the thoracic and lumbar spine since our last visit because of propriospinal myoclonus.  Those were declined by his insurance company as they required x-rays first.  MRI thoracic spine with mild degenerative changes and moderate in the lumbar spine.  He did have some lab work done since last visit.  His vitamin B12 was quite low at 171.  Injections were recommended.  He did one at pcp office and that was several months ago (November 7 per pt wife).  He has not had any since.  He is drinking about 4 alcoholic drinks per day (bourbon)    CURRENT MEDICATIONS:  Outpatient Encounter Medications  as of 08/14/2023  Medication Sig   allopurinol  (ZYLOPRIM ) 300 MG tablet Take 1 tablet (300 mg total) by mouth daily.   aspirin  EC 81 MG tablet Take 1 tablet (81 mg total) by mouth daily. Swallow whole.   bisoprolol  (ZEBETA ) 5 MG tablet Take 1 tablet (5 mg total) by mouth daily.   candesartan  (ATACAND ) 32 MG tablet TAKE 1 TABLET BY MOUTH EVERY DAY   colchicine  0.6 MG tablet Take 1 tablet (0.6 mg total) by mouth daily as needed (gout or psuedogout pain).   fluticasone  (FLONASE ) 50 MCG/ACT nasal spray Place 2 sprays into both nostrils daily.   indapamide  (LOZOL ) 1.25 MG tablet Take 1 tablet (1.25 mg total) by mouth daily.   levothyroxine  (SYNTHROID ) 100 MCG tablet TAKE 1 TABLET BY MOUTH EVERY DAY   MAGnesium -Oxide 400 (240 Mg) MG tablet Take 1 tablet (400 mg total) by mouth daily.   omeprazole  (PRILOSEC) 40 MG capsule Take 1 capsule (40 mg total) by mouth daily.   primidone  (MYSOLINE ) 50 MG tablet Take 1 tablet (50 mg total) by mouth 2 (two) times daily.   rosuvastatin  (CRESTOR ) 20 MG tablet TAKE 1 TABLET BY MOUTH EVERY DAY   thiamine  (VITAMIN B-1) 50 MG tablet Take 1 tablet (50 mg total) by mouth daily.   zolpidem  (AMBIEN ) 10 MG tablet  Take 1 tablet (10 mg total) by mouth at bedtime as needed. for sleep   No facility-administered encounter medications on file as of 08/14/2023.     Objective:   PHYSICAL EXAMINATION:    VITALS:   Vitals:   08/14/23 1301  BP: 134/80  Pulse: 60  SpO2: 98%  Weight: 174 lb 6.4 oz (79.1 kg)  Height: 5\' 10"  (1.778 m)    GEN:  The patient appears stated age and is in NAD. HEENT:  Normocephalic, atraumatic.  The mucous membranes are moist. The superficial temporal arteries are without ropiness or tenderness. CV:  RRR Lungs:  CTAB Neck/HEME:  There are no carotid bruits bilaterally.  Neurological examination:  Orientation: The patient is alert and oriented x3. Cranial nerves: There is good facial symmetry.The speech is fluent and clear. Soft palate rises symmetrically and there is no tongue deviation. Hearing is intact to conversational tone. Sensation: Sensation is intact to light touch throughout Motor: Strength is at least antigravity x4.  Movement examination: Tone: There is normal tone in the UE/LE Abnormal movements: No rest tremor.  Very minimal postural or intention tremor.  No myoclonus.  No asterixis.   Archimedes spiral as last visit, prior to primidone :  Archimedes spirals today:  Coordination:  There is no decremation with RAM's. Gait and Station: The patient has no difficulty arising out of a deep-seated chair without the use of the hands. The patient's stride length is good.    Total time spent on today's visit was 34 minutes, including both face-to-face time and nonface-to-face time.  Time included that spent on review of records (prior notes available to me/labs/imaging if pertinent), discussing treatment and goals, answering patient's questions and coordinating care.  Cc:  Arcadio Knuckles, MD

## 2023-08-14 ENCOUNTER — Other Ambulatory Visit: Payer: Self-pay | Admitting: Internal Medicine

## 2023-08-14 ENCOUNTER — Ambulatory Visit: Payer: Medicare HMO | Admitting: Neurology

## 2023-08-14 ENCOUNTER — Encounter: Payer: Self-pay | Admitting: Neurology

## 2023-08-14 VITALS — BP 134/80 | HR 60 | Ht 70.0 in | Wt 174.4 lb

## 2023-08-14 DIAGNOSIS — G8929 Other chronic pain: Secondary | ICD-10-CM | POA: Diagnosis not present

## 2023-08-14 DIAGNOSIS — M545 Low back pain, unspecified: Secondary | ICD-10-CM | POA: Diagnosis not present

## 2023-08-14 DIAGNOSIS — M546 Pain in thoracic spine: Secondary | ICD-10-CM | POA: Diagnosis not present

## 2023-08-14 DIAGNOSIS — R251 Tremor, unspecified: Secondary | ICD-10-CM

## 2023-08-14 DIAGNOSIS — F1090 Alcohol use, unspecified, uncomplicated: Secondary | ICD-10-CM | POA: Diagnosis not present

## 2023-08-14 DIAGNOSIS — E538 Deficiency of other specified B group vitamins: Secondary | ICD-10-CM

## 2023-08-14 DIAGNOSIS — F5101 Primary insomnia: Secondary | ICD-10-CM

## 2023-08-14 MED ORDER — CYANOCOBALAMIN 1000 MCG/ML IJ SOLN
1000.0000 ug | Freq: Once | INTRAMUSCULAR | Status: AC
  Administered 2023-08-14: 1000 ug via INTRAMUSCULAR

## 2023-08-14 NOTE — Patient Instructions (Signed)
 After the 4 weeks of B12 injections, you can start on oral b12, 1000micrograms daily.

## 2023-08-16 ENCOUNTER — Telehealth: Payer: Self-pay | Admitting: Internal Medicine

## 2023-08-16 NOTE — Telephone Encounter (Signed)
 Ambien  refilled 08/13/23 and sent to  [id number] - [place], [place] - [address]  [place] [address]  Per chart

## 2023-08-16 NOTE — Telephone Encounter (Signed)
 Copied from CRM [id number]. Topic: Clinical - Medication Refill >> Aug 16, 2023  8:37 AM Melissa C wrote: Medication: zolpidem  (AMBIEN ) 10 MG tablet   Has the patient contacted their pharmacy? Yes (Agent: If no, request that the patient contact the pharmacy for the refill. If patient does not wish to contact the pharmacy document the reason why and proceed with request.) (Agent: If yes, when and what did the pharmacy advise?)  This is the patient's preferred pharmacy:  [id number] [place], [place] - [address] [place] [address] Phone: [phone] Fax: [phone]  Is this the correct pharmacy for this prescription? Yes If no, delete pharmacy and type the correct one.   Has the prescription been filled recently? No  Is the patient out of the medication? Yes  Has the patient been seen for an appointment in the last year OR does the patient have an upcoming appointment? Yes  Can we respond through MyChart? Yes  Agent: Please be advised that Rx refills may take up to 3 business days. We ask that you follow-up with your pharmacy.

## 2023-08-17 ENCOUNTER — Other Ambulatory Visit: Payer: Self-pay | Admitting: Internal Medicine

## 2023-08-17 DIAGNOSIS — I10 Essential (primary) hypertension: Secondary | ICD-10-CM

## 2023-08-17 DIAGNOSIS — F5101 Primary insomnia: Secondary | ICD-10-CM

## 2023-08-17 MED ORDER — CANDESARTAN CILEXETIL 32 MG PO TABS: 32.0000 mg | ORAL_TABLET | Freq: Every day | ORAL | 1 refills | Status: DC

## 2023-08-17 NOTE — Telephone Encounter (Signed)
 Copied from CRM [id number]. Topic: Clinical - Medication Refill >> Aug 17, 2023 10:03 AM Carlatta H wrote: Medication: candesartan  (ATACAND ) 32 MG tablet [Pharmacy Med Name: CANDESARTAN  CILEXETIL 32 MG TB] [[id number]] zolpidem  (AMBIEN ) 10 MG tablet [[id number]]   Has the patient contacted their pharmacy? No (Agent: If no, request that the patient contact the pharmacy for the refill. If patient does not wish to contact the pharmacy document the reason why and proceed with request.) (Agent: If yes, when and what did the pharmacy advise?)  This is the patient's preferred pharmacy:  [id number] [place], [place] - [address] [place] [address] Phone: [phone] Fax: [phone]  Is this the correct pharmacy for this prescription? Yes If no, delete pharmacy and type the correct one.   Has the prescription been filled recently? No  Is the patient out of the medication? Yes  Has the patient been seen for an appointment in the last year OR does the patient have an upcoming appointment? No  Can we respond through MyChart? Yes  Agent: Please be advised that Rx refills may take up to 3 business days. We ask that you follow-up with your pharmacy.

## 2023-08-18 MED ORDER — ZOLPIDEM TARTRATE 10 MG PO TABS: 10.0000 mg | ORAL_TABLET | Freq: Every evening | ORAL | 0 refills | Status: DC | PRN

## 2023-08-22 ENCOUNTER — Ambulatory Visit

## 2023-08-22 ENCOUNTER — Other Ambulatory Visit: Payer: Self-pay

## 2023-08-22 DIAGNOSIS — E538 Deficiency of other specified B group vitamins: Secondary | ICD-10-CM

## 2023-08-22 MED ORDER — CYANOCOBALAMIN 1000 MCG/ML IJ SOLN
1000.0000 ug | Freq: Once | INTRAMUSCULAR | Status: AC
  Administered 2023-08-22: 1000 ug via INTRAMUSCULAR

## 2023-08-29 ENCOUNTER — Ambulatory Visit (INDEPENDENT_AMBULATORY_CARE_PROVIDER_SITE_OTHER)

## 2023-08-29 DIAGNOSIS — E538 Deficiency of other specified B group vitamins: Secondary | ICD-10-CM

## 2023-08-29 MED ORDER — CYANOCOBALAMIN 1000 MCG/ML IJ SOLN
1000.0000 ug | Freq: Once | INTRAMUSCULAR | Status: AC
Start: 2023-08-29 — End: 2023-08-29
  Administered 2023-08-29: 1000 ug via INTRAMUSCULAR

## 2023-09-05 ENCOUNTER — Ambulatory Visit (INDEPENDENT_AMBULATORY_CARE_PROVIDER_SITE_OTHER)

## 2023-09-05 DIAGNOSIS — E538 Deficiency of other specified B group vitamins: Secondary | ICD-10-CM

## 2023-09-05 MED ORDER — CYANOCOBALAMIN 1000 MCG/ML IJ SOLN
1000.0000 ug | Freq: Once | INTRAMUSCULAR | Status: AC
  Administered 2023-09-05: 1000 ug via INTRAMUSCULAR

## 2023-09-12 ENCOUNTER — Encounter: Payer: Self-pay | Admitting: Family Medicine

## 2023-09-13 ENCOUNTER — Other Ambulatory Visit: Payer: Self-pay | Admitting: Family Medicine

## 2023-09-13 DIAGNOSIS — M542 Cervicalgia: Secondary | ICD-10-CM

## 2023-09-15 ENCOUNTER — Other Ambulatory Visit: Payer: Self-pay | Admitting: Internal Medicine

## 2023-09-15 DIAGNOSIS — E039 Hypothyroidism, unspecified: Secondary | ICD-10-CM

## 2023-09-20 ENCOUNTER — Other Ambulatory Visit

## 2023-09-20 DIAGNOSIS — C672 Malignant neoplasm of lateral wall of bladder: Secondary | ICD-10-CM | POA: Diagnosis not present

## 2023-09-24 NOTE — Discharge Instructions (Signed)

## 2023-09-25 ENCOUNTER — Ambulatory Visit
Admission: RE | Admit: 2023-09-25 | Discharge: 2023-09-25 | Disposition: A | Discharge status: Home / Self Care | Source: Ambulatory Visit | Attending: Family Medicine | Admitting: Family Medicine

## 2023-09-25 ENCOUNTER — Other Ambulatory Visit: Payer: Self-pay | Admitting: Urology

## 2023-09-25 DIAGNOSIS — M47812 Spondylosis without myelopathy or radiculopathy, cervical region: Secondary | ICD-10-CM | POA: Diagnosis not present

## 2023-09-25 DIAGNOSIS — M542 Cervicalgia: Secondary | ICD-10-CM

## 2023-09-25 MED ORDER — IOPAMIDOL (ISOVUE-M 300) INJECTION 61%
1.0000 mL | Freq: Once | INTRAMUSCULAR | Status: AC | PRN
  Administered 2023-09-25: 1 mL via EPIDURAL

## 2023-09-25 MED ORDER — TRIAMCINOLONE ACETONIDE 40 MG/ML IJ SUSP (RADIOLOGY)
60.0000 mg | Freq: Once | INTRAMUSCULAR | Status: AC
  Administered 2023-09-25: 60 mg via EPIDURAL

## 2023-09-26 ENCOUNTER — Other Ambulatory Visit: Payer: Self-pay | Admitting: Urology

## 2023-09-26 DIAGNOSIS — C672 Malignant neoplasm of lateral wall of bladder: Secondary | ICD-10-CM

## 2023-09-27 ENCOUNTER — Other Ambulatory Visit: Payer: Self-pay | Admitting: Urology

## 2023-09-27 NOTE — Progress Notes (Addendum)
 COVID Vaccine received:  []  No [x]  Yes Date of any COVID positive Test in last 90 days: no PCP - Debby Molt MD Cardiologist - Dr. Oneil Parchment  Chest x-ray - Chest CT 06/26/23 Epic EKG -  07/19/23 Epic Stress Test -  ECHO - 02/19/23 Epic Cardiac Cath -   Bowel Prep - [x]  No  []   Yes ______  Pacemaker / ICD device [x]  No []  Yes   Spinal Cord Stimulator:[x]  No []  Yes       History of Sleep Apnea? [x]  No []  Yes   CPAP used?- [x]  No []  Yes    Does the patient monitor blood sugar?          [x]  No []  Yes  []  N/A  Patient has: [x]  NO Hx DM   []  Pre-DM                 []  DM1  []   DM2 Does patient have a [employer] or Dexacom? []  No []  Yes   Fasting Blood Sugar Ranges-  Checks Blood Sugar _____ times a day  GLP1 agonist / usual dose - no GLP1 instructions:  SGLT-2 inhibitors / usual dose - no SGLT-2 instructions:   Blood Thinner / Instructions:no Aspirin  Instructions:no  Comments:   Activity level: Patient is able o climb a flight of stairs without difficulty; [x]  No CP  [x]  No SOB,   Patient can perform ADLs without assistance.   Anesthesia review: CHF, Aortic aneurysm,CKD, HTN  Patient denies shortness of breath, fever, cough and chest pain at PAT appointment.  Patient verbalized understanding and agreement to the Pre-Surgical Instructions that were given to them at this PAT appointment. Patient was also educated of the need to review these PAT instructions again prior to his/her surgery.I reviewed the appropriate phone numbers to call if they have any and questions or concerns.

## 2023-09-27 NOTE — Patient Instructions (Addendum)
 SURGICAL WAITING ROOM VISITATION  Patients having surgery or a procedure may have no more than 2 support people in the waiting area - these visitors may rotate.    Children under the age of 82 must have an adult with them who is not the patient.  Visitors with respiratory illnesses are discouraged from visiting and should remain at home.  If the patient needs to stay at the hospital during part of their recovery, the visitor guidelines for inpatient rooms apply. Pre-op nurse will coordinate an appropriate time for 1 support person to accompany patient in pre-op.  This support person may not rotate.    Please refer to the [facility] website for the visitor guidelines for Inpatients (after your surgery is over and you are in a regular room).       Your procedure is scheduled on: 10/02/23   Report to [facility] Main Entrance    Report to admitting at 5:15 AM   Call this number if you have problems the morning of surgery [phone]   Do not eat food OR DRINK LIQUIDS:After Midnight. BUT MAY HAVE SIPS OF WATER WITH MEDS.    Oral Hygiene is also important to reduce your risk of infection.                                    Remember - BRUSH YOUR TEETH THE MORNING OF SURGERY WITH YOUR REGULAR TOOTHPASTE   Stop all vitamins and herbal supplements 7 days before surgery.   Take these medicines the morning of surgery with A SIP OF WATER: Allopurinol , bisoprolol (Zebeta ), Colchicine , Synthroid , Omeprazole , Primidone (mysoline ), Rosuvastatin , nasal spray              Do not take Atacand (candesartan ) the morning of surgery.             You may not have any metal on your body including hair pins, jewelry, and body piercing             Do not wear  lotions, powders, cologne, or deodorant              Men may shave face and neck.   Do not bring valuables to the hospital. [facility] IS NOT             RESPONSIBLE   FOR VALUABLES.   Contacts, glasses, dentures or bridgework may not  be worn into surgery.  DO NOT BRING YOUR HOME MEDICATIONS TO THE HOSPITAL. PHARMACY WILL DISPENSE MEDICATIONS LISTED ON YOUR MEDICATION LIST TO YOU DURING YOUR ADMISSION IN THE HOSPITAL!    Patients discharged on the day of surgery will not be allowed to drive home.  Someone NEEDS to stay with you for the first 24 hours after anesthesia.   Special Instructions: Bring a copy of your healthcare power of attorney and living will documents the day of surgery if you haven't scanned them before.              Please read over the following fact sheets you were given: IF YOU HAVE QUESTIONS ABOUT YOUR PRE-OP INSTRUCTIONS PLEASE CALL [phone] [place]   If you received a COVID test during your pre-op visit  it is requested that you wear a mask when out in public, stay away from anyone that may not be feeling well and notify your surgeon if you develop symptoms. If you test positive for Covid or have been in contact  with anyone that has tested positive in the last 10 days please notify you surgeon.    [facility] - Preparing for Surgery Before surgery, you can play an important role.  Because skin is not sterile, your skin needs to be as free of germs as possible.  You can reduce the number of germs on your skin by washing with CHG (chlorahexidine gluconate) soap before surgery.  CHG is an antiseptic cleaner which kills germs and bonds with the skin to continue killing germs even after washing. Please DO NOT use if you have an allergy to CHG or antibacterial soaps.  If your skin becomes reddened/irritated stop using the CHG and inform your nurse when you arrive at Short Stay. Do not shave (including legs and underarms) for at least 48 hours prior to the first CHG shower.  You may shave your face/neck.  Please follow these instructions carefully:  1.  Shower with CHG Soap the night before surgery and the  morning of surgery.  2.  If you choose to wash your hair, wash your hair first as usual with your  normal  shampoo.  3.  After you shampoo, rinse your hair and body thoroughly to remove the shampoo.                             4.  Use CHG as you would any other liquid soap.  You can apply chg directly to the skin and wash.  Gently with a scrungie or clean washcloth.  5.  Apply the CHG Soap to your body ONLY FROM THE NECK DOWN.   Do   not use on face/ open                           Wound or open sores. Avoid contact with eyes, ears mouth and   genitals (private parts).                       Wash face,  Genitals (private parts) with your normal soap.             6.  Wash thoroughly, paying special attention to the area where your    surgery  will be performed.  7.  Thoroughly rinse your body with warm water from the neck down.  8.  DO NOT shower/wash with your normal soap after using and rinsing off the CHG Soap.                9.  Pat yourself dry with a clean towel.            10.  Wear clean pajamas.            11.  Place clean sheets on your bed the night of your first shower and do not  sleep with pets. Day of Surgery : Do not apply any lotions/deodorants the morning of surgery.  Please wear clean clothes to the hospital/surgery center.  FAILURE TO FOLLOW THESE INSTRUCTIONS MAY RESULT IN THE CANCELLATION OF YOUR SURGERY  PATIENT SIGNATURE_________________________________  NURSE SIGNATURE__________________________________  ________________________________________________________________________

## 2023-09-28 ENCOUNTER — Encounter (HOSPITAL_COMMUNITY)
Admission: RE | Admit: 2023-09-28 | Discharge: 2023-09-28 | Disposition: A | Discharge status: Home / Self Care | Source: Ambulatory Visit | Attending: Urology | Admitting: Urology

## 2023-09-28 ENCOUNTER — Other Ambulatory Visit: Payer: Self-pay

## 2023-09-28 ENCOUNTER — Encounter: Payer: Self-pay | Admitting: Neurology

## 2023-09-28 ENCOUNTER — Encounter: Payer: Self-pay | Admitting: Urology

## 2023-09-28 ENCOUNTER — Encounter (HOSPITAL_COMMUNITY): Payer: Self-pay

## 2023-09-28 VITALS — BP 139/72 | HR 61 | Resp 16 | Ht 70.0 in | Wt 173.0 lb

## 2023-09-28 DIAGNOSIS — E039 Hypothyroidism, unspecified: Secondary | ICD-10-CM | POA: Insufficient documentation

## 2023-09-28 DIAGNOSIS — Z01818 Encounter for other preprocedural examination: Secondary | ICD-10-CM | POA: Diagnosis not present

## 2023-09-28 DIAGNOSIS — N183 Chronic kidney disease, stage 3 unspecified: Secondary | ICD-10-CM | POA: Insufficient documentation

## 2023-09-28 DIAGNOSIS — Z8589 Personal history of malignant neoplasm of other organs and systems: Secondary | ICD-10-CM | POA: Insufficient documentation

## 2023-09-28 DIAGNOSIS — C672 Malignant neoplasm of lateral wall of bladder: Secondary | ICD-10-CM | POA: Diagnosis not present

## 2023-09-28 DIAGNOSIS — I10 Essential (primary) hypertension: Secondary | ICD-10-CM | POA: Diagnosis not present

## 2023-09-28 DIAGNOSIS — Z923 Personal history of irradiation: Secondary | ICD-10-CM | POA: Insufficient documentation

## 2023-09-28 DIAGNOSIS — Z01812 Encounter for preprocedural laboratory examination: Secondary | ICD-10-CM | POA: Insufficient documentation

## 2023-09-28 DIAGNOSIS — I719 Aortic aneurysm of unspecified site, without rupture: Secondary | ICD-10-CM | POA: Insufficient documentation

## 2023-09-28 HISTORY — DX: Unspecified osteoarthritis, unspecified site: M19.90

## 2023-09-28 LAB — CBC
HCT: 40.6 % (ref 39.0–52.0)
Hemoglobin: 13.2 g/dL (ref 13.0–17.0)
MCH: 28.8 pg (ref 26.0–34.0)
MCHC: 32.5 g/dL (ref 30.0–36.0)
MCV: 88.6 fL (ref 80.0–100.0)
Platelets: 337 10*3/uL (ref 150–400)
RBC: 4.58 MIL/uL (ref 4.22–5.81)
RDW: 18.2 % — ABNORMAL HIGH (ref 11.5–15.5)
WBC: 10 10*3/uL (ref 4.0–10.5)
nRBC: 0 % (ref 0.0–0.2)

## 2023-09-28 LAB — BASIC METABOLIC PANEL WITH GFR
Anion gap: 10 (ref 5–15)
BUN: 35 mg/dL — ABNORMAL HIGH (ref 8–23)
CO2: 23 mmol/L (ref 22–32)
Calcium: 9.2 mg/dL (ref 8.9–10.3)
Chloride: 105 mmol/L (ref 98–111)
Creatinine, Ser: 0.92 mg/dL (ref 0.61–1.24)
GFR, Estimated: >60 mL/min (ref >60)
Glucose, Bld: 97 mg/dL (ref 70–99)
Potassium: 3.7 mmol/L (ref 3.5–5.1)
Sodium: 138 mmol/L (ref 135–145)

## 2023-10-01 ENCOUNTER — Other Ambulatory Visit: Payer: Self-pay | Admitting: Urology

## 2023-10-01 ENCOUNTER — Ambulatory Visit
Admission: RE | Admit: 2023-10-01 | Discharge: 2023-10-01 | Disposition: A | Discharge status: Home / Self Care | Source: Ambulatory Visit | Attending: Urology | Admitting: Urology

## 2023-10-01 ENCOUNTER — Encounter (HOSPITAL_COMMUNITY): Payer: Self-pay | Admitting: Urology

## 2023-10-01 DIAGNOSIS — C672 Malignant neoplasm of lateral wall of bladder: Secondary | ICD-10-CM

## 2023-10-01 DIAGNOSIS — C679 Malignant neoplasm of bladder, unspecified: Secondary | ICD-10-CM | POA: Diagnosis not present

## 2023-10-01 MED ORDER — IOPAMIDOL (ISOVUE-300) INJECTION 61%: 100.0000 mL | Freq: Once | INTRAVENOUS | Status: DC | PRN

## 2023-10-01 NOTE — Progress Notes (Signed)
 Anesthesia Chart Review   Case: [id number] Date/Time: 10/02/23 0715   Procedures:      TURBT, WITH CHEMOTHERAPEUTIC AGENT INSTILLATION INTO BLADDER - INSTILL GEMCITABINE      CYSTOSCOPY, WITH RETROGRADE PYELOGRAM (Bilateral)   Anesthesia type: General   Diagnosis: Malignant neoplasm of lateral wall of urinary bladder (HCC) [C67.2]   Pre-op diagnosis: BLADDER CANCER   Location: [facility] ROOM 08 / [facility]   Surgeons: Nieves Cough, MD       DISCUSSION:73 y.o. former smoker with h/o HTN, hypothyroidism, aortic aneurysm, CKD Stage III, head and neck cancer 2010, bladder cancer scheduled for above procedure 10/02/2023 with Dr. Cough Nieves.   Pt last seen by cardiology 05/28/2023. Stable aortic aneurysm, 4.1 cm on CT, recent echo 4.5 cm. Annual imaging recommended. PRN cardio follow up recommended.   Pt s/p radiation for neck cancer 2010, experiences continued limited ROM. S/p resection of bladder tumor 02/10/2022 with no anesthesia complications noted.  VS: BP 139/72   Pulse 61   Resp 16   Ht 5' 10 (1.778 m)   Wt 78.5 kg   SpO2 100%   BMI 24.82 kg/m   PROVIDERS: [name], MD is PCP   Cardiologist - Dr. Oneil Parchment   LABS: Labs reviewed: Acceptable for surgery. (all labs ordered are listed, but only abnormal results are displayed)  Labs Reviewed  CBC - Abnormal; Notable for the following components:      Result Value   RDW 18.2 (*)    All other components within normal limits  BASIC METABOLIC PANEL WITH GFR - Abnormal; Notable for the following components:   BUN 35 (*)    All other components within normal limits     IMAGES:   EKG:   CV: Echo 02/19/2023 1. Left ventricular ejection fraction, by estimation, is 55 to 60%. The  left ventricle has normal function. The left ventricle has no regional  wall motion abnormalities. Left ventricular diastolic parameters are  consistent with Grade II diastolic  dysfunction (pseudonormalization).   2. Right ventricular  systolic function is normal. The right ventricular  size is mildly enlarged. There is normal pulmonary artery systolic  pressure.   3. Left atrial size was mild to moderately dilated.   4. The mitral valve is normal in structure. Trivial mitral valve  regurgitation. No evidence of mitral stenosis.   5. The aortic valve is tricuspid. Aortic valve regurgitation is not  visualized. No aortic stenosis is present.   6. Aortic dilatation noted. There is moderate dilatation of the ascending  aorta, measuring 45 mm.   7. The inferior vena cava is normal in size with greater than 50%  respiratory variability, suggesting right atrial pressure of 3 mmHg.  Past Medical History:  Diagnosis Date   Allergy    mild   Aortic aneurysm (HCC)    small and watching. followed by his PCP Dr. debby Joshua, 02/06/2022   Arthritis    GERD (gastroesophageal reflux disease)    Head and neck cancer    head and neck ca dx 03/2009   Hyperlipidemia    Hypertension    Hypothyroidism    oropharyngeal ca dx'd 03/2009   chemo/xrt comp 06/2009   Swallowing difficulty    due to his cancer in neck and throat sometimes has problems swalling pills and had problem intubation due to radion to neck are. 02/06/2022   Thyroid  disease    Wears glasses    reading 02/06/2022    Past Surgical History:  Procedure  Laterality Date   CATARACT EXTRACTION Right    had it a couple of months ago   02/06/2022   HERNIA REPAIR     inguinal   hip replacemnt left Left    Left eye     LUMBAR LAMINECTOMY     stmach surgery     repair peg tube site   TRANSURETHRAL RESECTION OF BLADDER TUMOR WITH MITOMYCIN -C Bilateral 02/10/2022   Procedure: TRANSURETHRAL RESECTION OF BLADDER TUMOR  AND  POST OP INSTILLATION OF GEMCITABINE ;  Surgeon: Nieves Cough, MD;  Location: [facility] ;  Service: Urology;  Laterality: Bilateral;  1 HR FOR CASE    MEDICATIONS:  allopurinol  (ZYLOPRIM ) 300 MG tablet   aspirin  EC 81 MG tablet    bisoprolol  (ZEBETA ) 5 MG tablet   candesartan  (ATACAND ) 32 MG tablet   colchicine  0.6 MG tablet   fluticasone  (FLONASE ) 50 MCG/ACT nasal spray   indapamide  (LOZOL ) 1.25 MG tablet   levothyroxine  (SYNTHROID ) 100 MCG tablet   MAGnesium -Oxide 400 (240 Mg) MG tablet   omeprazole  (PRILOSEC) 40 MG capsule   primidone  (MYSOLINE ) 50 MG tablet   rosuvastatin  (CRESTOR ) 20 MG tablet   thiamine  (VITAMIN B-1) 50 MG tablet   zolpidem  (AMBIEN ) 10 MG tablet   No current facility-administered medications for this encounter.   Harlene Hoots Ward, PA-C [facility] [phone]

## 2023-10-01 NOTE — Anesthesia Preprocedure Evaluation (Addendum)
 Anesthesia Evaluation  Patient identified by MRN, date of birth, ID band Patient awake    Reviewed: Allergy & Precautions, NPO status , Patient's Chart, lab work & pertinent test results  Airway Mallampati: III  TM Distance: >3 FB Neck ROM: Limited  Mouth opening: Limited Mouth Opening Comment: S/P radiation to Neck w limited mobilty Opens 2 finger breath Dental no notable dental hx. (+) Teeth Intact, Dental Advisory Given   Pulmonary former smoker   Pulmonary exam normal breath sounds clear to auscultation       Cardiovascular hypertension, Normal cardiovascular exam Rhythm:Regular Rate:Normal     Neuro/Psych negative neurological ROS  negative psych ROS   GI/Hepatic Neg liver ROS,GERD  ,,  Endo/Other  Hypothyroidism    Renal/GU Lab Results      Component                Value               Date                      CREATININE               1.33                10/12/2021                BUN                      55 (H)              10/12/2021                NA                       142                 10/12/2021                K                        4.3                 10/12/2021                CL                       107                 10/12/2021                CO2                      24                  10/12/2021              Bladder tumor    Musculoskeletal  (+) Arthritis ,    Abdominal   Peds  Hematology   Anesthesia Other Findings   Reproductive/Obstetrics                             Anesthesia Physical Anesthesia Plan  ASA: 3  Anesthesia Plan: General   Post-op Pain Management: Tylenol  PO (pre-op)*   Induction: Intravenous  PONV Risk Score and Plan: 3 and Treatment may  vary due to age or medical condition, Midazolam  and Ondansetron   Airway Management Planned: LMA  Additional Equipment: None  Intra-op Plan:   Post-operative Plan:   Informed Consent: I have  reviewed the patients History and Physical, chart, labs and discussed the procedure including the risks, benefits and alternatives for the proposed anesthesia with the patient or authorized representative who has indicated his/her understanding and acceptance.     Dental advisory given  Plan Discussed with: Anesthesiologist  Anesthesia Plan Comments: (    DISCUSSION:73 y.o. former smoker with h/o HTN, hypothyroidism, aortic aneurysm, CKD Stage III, head and neck cancer 2010, bladder cancer scheduled for above procedure 10/02/2023 with Dr. [name].    Pt last seen by cardiology 05/28/2023. Stable aortic aneurysm, 4.1 cm on CT, recent echo 4.5 cm. Annual imaging recommended. PRN cardio follow up recommended.    Pt s/p radiation for neck cancer 2010, experiences continued limited ROM. S/p resection of bladder tumor 02/10/2022 with no anesthesia complications noted.   Echo 02/19/2023 1. Left ventricular ejection fraction, by estimation, is 55 to 60%. The  left ventricle has normal function. The left ventricle has no regional  wall motion abnormalities. Left ventricular diastolic parameters are  consistent with Grade II diastolic  dysfunction (pseudonormalization).   2. Right ventricular systolic function is normal. The right ventricular  size is mildly enlarged. There is normal pulmonary artery systolic  pressure.   3. Left atrial size was mild to moderately dilated.   4. The mitral valve is normal in structure. Trivial mitral valve  regurgitation. No evidence of mitral stenosis.   5. The aortic valve is tricuspid. Aortic valve regurgitation is not  visualized. No aortic stenosis is present.   6. Aortic dilatation noted. There is moderate dilatation of the ascending  aorta, measuring 45 mm.   7. The inferior vena cava is normal in size with greater than 50%  respiratory variability, suggesting right atrial pressure of 3 mmHg.  )        Anesthesia Quick Evaluation

## 2023-10-01 NOTE — H&P (Signed)
 Office Visit Report     09/20/2023   --------------------------------------------------------------------------------   Brandon [name]  MRN: [id number]  DOB: 09/09/1950, 73 year old Male  SSN:    PRIMARY CARE:  Brandon FREDRIK Molt, MD  PRIMARY CARE FAX:  [phone]  REFERRING:  Brandon FREDRIK Molt, MD  PROVIDER:  [name], M.D.  LOCATION:  [facility] [phone]     --------------------------------------------------------------------------------   CC/HPI: F/u -   1) bladder cancer - dx with LG Ta (posteriolateral to right UO and carpeting behind it) Nov 2023. He underwent a renal ultrasound July 2023 for chronic kidney disease with a creatinine of 1.33 and GFR 55. Renal ultrasound showed no hydronephrosis or renal mass or stone, but did show small area of focal nodularity of the bladder wall. Cysto confirmed a small right tumor. He smoked for about 11 years. He had throat cancer. He had chemo and XRT.   TUR:  Nov 2023 LG Ta right bladder (adjacent ro right UO)   Staging:  Oct 2023 CT a/p - no bladder mass nor mets noted   Today, seen for the above. He is well. No dysuria or gross hematuria.   April 2023 PSA was 1.1. 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     ALLERGIES: No Allergies    MEDICATIONS: Allopurinol  300 MG Tablet 1 tablet PO Daily  Levothyroxine  Sodium  Omeprazole   Bisoprolol  Fumarate 5 MG Tablet 1 tablet PO Daily  Candesartan  Cilexetil-HCTZ 32-25 MG Tablet 1 tablet PO Daily  Magnesium  400 MG Tablet  Primidone  50 MG Tablet 1 tablet PO BID  Rosuvastatin  Calcium  20 MG Tablet  Zolpidem  Tartrate 10 MG Tablet     GU PSH: Cystoscopy - 03/21/2023, 11/29/2022, 05/25/2022, 01/12/2022 Locm 300-399Mg /Ml Iodine,1Ml - 01/23/2022     NON-GU PSH: Cataract surgery, Right - about 12/22/2021 Hip Replacement, Left Stomach surgery (unspecified) Visit Complexity (formerly GPC1X) - 05/25/2022     GU PMH: History of bladder cancer, check cysto in 6 mo -  03/21/2023, recheck cysto in 3-4 months , - 11/29/2022, clear cystoscopy today. Given low grade disease we will repeat cystoscopy in 6 months. , - 05/25/2022 Bladder Cancer Lateral, Using his path report discussed his stage, grade and prognosis. He would be considered low risk. Disc nature r/b/a to surv. See back in 3 months for cystoscopy. - 02/23/2022 Bladder tumor/neoplasm - 01/23/2022, (Stable), We viewed the tumor on the cystoscopy screen. Discussed with patient and wife the nature r/b/a to TURBT with post-op instillation of chemo. Also disc poss need for stent, foley, staged procedure. I'm going to get a more thorough staging study with CT A/P. , - 01/12/2022, disc nature r/b/a to cystoscpopy and he will schedule. , - 12/19/2021 Gross hematuria - 01/23/2022, He voided with red urine after the cystoscopy. This should clear but given return precautions. , - 01/12/2022 Renal cyst - 01/12/2022      PMH Notes: Neck Cancer,    NON-GU PMH: Aortic aneurysm of unspecified site, without rupture Arthritis GERD Hypertension Hypothyroidism    FAMILY HISTORY: No Family History    SOCIAL HISTORY: Marital Status: Married Preferred Language: English; Ethnicity: Not Hispanic Or Latino; Race: Bera Current Smoking Status: Patient does not smoke anymore. Has not smoked since 12/10/2010. Smoked for 20 years.   Tobacco Use Assessment Completed: Used Tobacco in last 30 days? Does not use smokeless tobacco. Does drink.  Drinks 1 caffeinated drink per day.    REVIEW OF SYSTEMS:    GU Review  Male:   Patient denies frequent urination, hard to postpone urination, burning/ pain with urination, get up at night to urinate, leakage of urine, stream starts and stops, trouble starting your stream, have to strain to urinate , erection problems, and penile pain.  Gastrointestinal (Upper):   Patient denies nausea, vomiting, and indigestion/ heartburn.  Gastrointestinal (Lower):   Patient denies diarrhea and constipation.   Constitutional:   Patient denies fever, night sweats, weight loss, and fatigue.  Skin:   Patient denies skin rash/ lesion and itching.  Eyes:   Patient denies blurred vision and double vision.  Ears/ Nose/ Throat:   Patient denies sore throat and sinus problems.  Hematologic/Lymphatic:   Patient denies swollen glands and easy bruising.  Cardiovascular:   Patient denies leg swelling and chest pains.  Respiratory:   Patient denies shortness of breath and cough.  Endocrine:   Patient denies excessive thirst.  Musculoskeletal:   Patient denies back pain and joint pain.  Neurological:   Patient denies headaches and dizziness.  Psychologic:   Patient denies depression and anxiety.   VITAL SIGNS: None   MULTI-SYSTEM PHYSICAL EXAMINATION:    Constitutional: Well-nourished. No physical deformities. Normally developed. Good grooming.  Neck: Neck symmetrical, not swollen. Normal tracheal position.  Respiratory: No labored breathing, no use of accessory muscles.   Cardiovascular: Normal temperature, normal extremity pulses, no swelling, no varicosities.  Skin: No paleness, no jaundice, no cyanosis. No lesion, no ulcer, no rash.  Neurologic / Psychiatric: Oriented to time, oriented to place, oriented to person. No depression, no anxiety, no agitation.  Gastrointestinal: No mass, no tenderness, no rigidity, non obese abdomen.     PAST DATA REVIEW: None   PROCEDURES:         Flexible Cystoscopy - 52000  Risks, benefits, and some of the potential complications of the procedure were discussed with the patient. All questions were answered. Informed consent was obtained. Antibiotic prophylaxis was given -- Cephalexin. Sterile technique and intraurethral analgesia were used.  Meatus:  Normal size. Normal location. Normal condition.  Urethra:  No strictures.  External Sphincter:  Normal.  Verumontanum:  Normal.  Prostate:  Non-obstructing. No hyperplasia.  Bladder Neck:  Non-obstructing.  Ureteral  Orifices:  Normal location. Normal size. Normal shape. Effluxed clear urine.  Bladder:  A right lateral wall tumor - small in front of right UO in corner. No trabeculation. Normal mucosa. No stones.      The lower urinary tract was carefully examined. The procedure was well-tolerated and without complications. Antibiotic instructions were given. Instructions were given to call the office immediately for bloody urine, difficulty urinating, painful urination, fever, chills, nausea, vomiting or other illness. The patient stated that he understood these instructions and would comply with them.         Urinalysis Dipstick Dipstick Cont'd  Color: Yellow Bilirubin: Neg mg/dL  Appearance: Clear Ketones: Neg mg/dL  Specific Gravity: 8.974 Blood: Neg ery/uL  pH: <=5.0 Protein: Neg mg/dL  Glucose: Neg mg/dL Urobilinogen: 0.2 mg/dL    Nitrites: Neg    Leukocyte Esterase: Neg leu/uL    ASSESSMENT:      ICD-10 Details  1 GU:   Bladder Cancer Lateral - C67.2 Chronic, Stable - early recurrence - right BN/anterior to right UO in the corner. Disc nature r/b/a to cysto, bbx fulg and post-op gemcitabine . He will proceed. check upper tracts.   PLAN:           Orders Labs BUN/Creatinine  X-Rays: C.T. Hematuria With and  Without I.V. Contrast          Schedule Return Visit/Planned Activity: Next Available Appointment - Schedule Surgery          Document Letter(s):  Created for Patient: Clinical Summary         Notes:   cc: Dr. Joshua     * Signed by [name], M.D. on 09/21/23 at 2:03 PM (EDT)*     ADD: non-con CT today. Looks benign to me (unofficial). They could not get IV for contrast.

## 2023-10-02 ENCOUNTER — Ambulatory Visit (HOSPITAL_COMMUNITY)

## 2023-10-02 ENCOUNTER — Ambulatory Visit (HOSPITAL_BASED_OUTPATIENT_CLINIC_OR_DEPARTMENT_OTHER): Payer: Self-pay | Admitting: Anesthesiology

## 2023-10-02 ENCOUNTER — Encounter (HOSPITAL_COMMUNITY)
Admission: RE | Disposition: A | Discharge status: Home / Self Care | Payer: Self-pay | Source: Home / Self Care | Attending: Urology

## 2023-10-02 ENCOUNTER — Ambulatory Visit (HOSPITAL_COMMUNITY)
Admission: RE | Admit: 2023-10-02 | Discharge: 2023-10-02 | Disposition: A | Discharge status: Home / Self Care | Attending: Urology | Admitting: Urology

## 2023-10-02 ENCOUNTER — Ambulatory Visit (HOSPITAL_COMMUNITY): Payer: Self-pay | Admitting: Physician Assistant

## 2023-10-02 ENCOUNTER — Encounter (HOSPITAL_COMMUNITY): Payer: Self-pay | Admitting: Urology

## 2023-10-02 DIAGNOSIS — I129 Hypertensive chronic kidney disease with stage 1 through stage 4 chronic kidney disease, or unspecified chronic kidney disease: Secondary | ICD-10-CM | POA: Insufficient documentation

## 2023-10-02 DIAGNOSIS — Z85819 Personal history of malignant neoplasm of unspecified site of lip, oral cavity, and pharynx: Secondary | ICD-10-CM | POA: Insufficient documentation

## 2023-10-02 DIAGNOSIS — Z923 Personal history of irradiation: Secondary | ICD-10-CM | POA: Insufficient documentation

## 2023-10-02 DIAGNOSIS — N3289 Other specified disorders of bladder: Secondary | ICD-10-CM | POA: Insufficient documentation

## 2023-10-02 DIAGNOSIS — K219 Gastro-esophageal reflux disease without esophagitis: Secondary | ICD-10-CM | POA: Diagnosis not present

## 2023-10-02 DIAGNOSIS — Z8551 Personal history of malignant neoplasm of bladder: Secondary | ICD-10-CM | POA: Insufficient documentation

## 2023-10-02 DIAGNOSIS — N1831 Chronic kidney disease, stage 3a: Secondary | ICD-10-CM | POA: Diagnosis not present

## 2023-10-02 DIAGNOSIS — I10 Essential (primary) hypertension: Secondary | ICD-10-CM | POA: Diagnosis not present

## 2023-10-02 DIAGNOSIS — I130 Hypertensive heart and chronic kidney disease with heart failure and stage 1 through stage 4 chronic kidney disease, or unspecified chronic kidney disease: Secondary | ICD-10-CM | POA: Diagnosis not present

## 2023-10-02 DIAGNOSIS — Z9221 Personal history of antineoplastic chemotherapy: Secondary | ICD-10-CM | POA: Insufficient documentation

## 2023-10-02 DIAGNOSIS — I5030 Unspecified diastolic (congestive) heart failure: Secondary | ICD-10-CM | POA: Diagnosis not present

## 2023-10-02 DIAGNOSIS — D414 Neoplasm of uncertain behavior of bladder: Secondary | ICD-10-CM

## 2023-10-02 DIAGNOSIS — N189 Chronic kidney disease, unspecified: Secondary | ICD-10-CM | POA: Insufficient documentation

## 2023-10-02 DIAGNOSIS — C672 Malignant neoplasm of lateral wall of bladder: Secondary | ICD-10-CM | POA: Diagnosis present

## 2023-10-02 DIAGNOSIS — Z87891 Personal history of nicotine dependence: Secondary | ICD-10-CM | POA: Diagnosis not present

## 2023-10-02 HISTORY — PX: CYSTOSCOPY W/ RETROGRADES: SHX1426

## 2023-10-02 SURGERY — TURBT, WITH CHEMOTHERAPEUTIC AGENT INSTILLATION INTO BLADDER
Anesthesia: General | Site: Pelvis

## 2023-10-02 MED ORDER — OXYCODONE HCL 5 MG PO TABS: 5.0000 mg | ORAL_TABLET | Freq: Once | ORAL | Status: DC | PRN

## 2023-10-02 MED ORDER — ACETAMINOPHEN 500 MG PO TABS
1000.0000 mg | ORAL_TABLET | Freq: Once | ORAL | Status: DC
  Filled 2023-10-02: qty 2

## 2023-10-02 MED ORDER — FENTANYL CITRATE (PF) 100 MCG/2ML IJ SOLN
INTRAMUSCULAR | Status: DC | PRN
  Administered 2023-10-02: 50 ug via INTRAVENOUS
  Administered 2023-10-02 (×2): 25 ug via INTRAVENOUS

## 2023-10-02 MED ORDER — GEMCITABINE CHEMO FOR BLADDER INSTILLATION 2000 MG
2000.0000 mg | Freq: Once | INTRAVENOUS | Status: AC
  Administered 2023-10-02: 2000 mg via INTRAVESICAL
  Filled 2023-10-02: qty 2000

## 2023-10-02 MED ORDER — LIDOCAINE HCL (CARDIAC) PF 100 MG/5ML IV SOSY
PREFILLED_SYRINGE | INTRAVENOUS | Status: DC | PRN
  Administered 2023-10-02: 100 mg via INTRAVENOUS

## 2023-10-02 MED ORDER — ONDANSETRON HCL 4 MG/2ML IJ SOLN: 4.0000 mg | Freq: Once | INTRAMUSCULAR | Status: DC | PRN

## 2023-10-02 MED ORDER — MEPERIDINE HCL 50 MG/ML IJ SOLN: 6.2500 mg | INTRAMUSCULAR | Status: DC | PRN

## 2023-10-02 MED ORDER — CEFAZOLIN SODIUM-DEXTROSE 2-4 GM/100ML-% IV SOLN
2.0000 g | INTRAVENOUS | Status: AC
  Administered 2023-10-02: 2 g via INTRAVENOUS
  Filled 2023-10-02: qty 100

## 2023-10-02 MED ORDER — ACETAMINOPHEN 500 MG PO TABS
1000.0000 mg | ORAL_TABLET | Freq: Once | ORAL | Status: AC
  Administered 2023-10-02: 1000 mg via ORAL

## 2023-10-02 MED ORDER — ONDANSETRON HCL 4 MG/2ML IJ SOLN
INTRAMUSCULAR | Status: DC | PRN
  Administered 2023-10-02: 4 mg via INTRAVENOUS

## 2023-10-02 MED ORDER — LIDOCAINE HCL (PF) 2 % IJ SOLN
INTRAMUSCULAR | Status: AC
  Filled 2023-10-02: qty 15

## 2023-10-02 MED ORDER — FENTANYL CITRATE (PF) 100 MCG/2ML IJ SOLN
INTRAMUSCULAR | Status: AC
  Filled 2023-10-02: qty 2

## 2023-10-02 MED ORDER — PROPOFOL 10 MG/ML IV BOLUS
INTRAVENOUS | Status: DC | PRN
  Administered 2023-10-02: 150 mg via INTRAVENOUS

## 2023-10-02 MED ORDER — SODIUM CHLORIDE 0.9 % IR SOLN
Status: DC | PRN
  Administered 2023-10-02: 3000 mL

## 2023-10-02 MED ORDER — CHLORHEXIDINE GLUCONATE 0.12 % MT SOLN
15.0000 mL | Freq: Once | OROMUCOSAL | Status: AC
  Administered 2023-10-02: 15 mL via OROMUCOSAL

## 2023-10-02 MED ORDER — ORAL CARE MOUTH RINSE: 15.0000 mL | Freq: Once | OROMUCOSAL | Status: AC

## 2023-10-02 MED ORDER — ONDANSETRON HCL 4 MG/2ML IJ SOLN
INTRAMUSCULAR | Status: AC
  Filled 2023-10-02: qty 6

## 2023-10-02 MED ORDER — GLYCOPYRROLATE 0.2 MG/ML IJ SOLN
INTRAMUSCULAR | Status: AC
  Filled 2023-10-02: qty 1

## 2023-10-02 MED ORDER — DEXAMETHASONE SODIUM PHOSPHATE 10 MG/ML IJ SOLN
INTRAMUSCULAR | Status: AC
  Filled 2023-10-02: qty 3

## 2023-10-02 MED ORDER — FENTANYL CITRATE PF 50 MCG/ML IJ SOSY: 25.0000 ug | PREFILLED_SYRINGE | INTRAMUSCULAR | Status: DC | PRN

## 2023-10-02 MED ORDER — IOPAMIDOL (ISOVUE-300) INJECTION 61%
INTRAVENOUS | Status: DC | PRN
  Administered 2023-10-02: 42 mL

## 2023-10-02 MED ORDER — OXYCODONE HCL 5 MG/5ML PO SOLN: 5.0000 mg | Freq: Once | ORAL | Status: DC | PRN

## 2023-10-02 MED ORDER — DEXAMETHASONE SODIUM PHOSPHATE 10 MG/ML IJ SOLN
INTRAMUSCULAR | Status: DC | PRN
  Administered 2023-10-02: 8 mg via INTRAVENOUS

## 2023-10-02 MED ORDER — MIDAZOLAM HCL 5 MG/5ML IJ SOLN
INTRAMUSCULAR | Status: DC | PRN
  Administered 2023-10-02: 2 mg via INTRAVENOUS

## 2023-10-02 MED ORDER — GLYCOPYRROLATE 0.2 MG/ML IJ SOLN
INTRAMUSCULAR | Status: DC | PRN
Start: 2023-10-02 — End: 2023-10-02
  Administered 2023-10-02: .2 mg via INTRAVENOUS

## 2023-10-02 MED ORDER — PROPOFOL 10 MG/ML IV BOLUS
INTRAVENOUS | Status: AC
  Filled 2023-10-02: qty 20

## 2023-10-02 MED ORDER — EPHEDRINE SULFATE (PRESSORS) 50 MG/ML IJ SOLN
INTRAMUSCULAR | Status: DC | PRN
  Administered 2023-10-02 (×3): 5 mg via INTRAVENOUS
  Administered 2023-10-02: 10 mg via INTRAVENOUS

## 2023-10-02 MED ORDER — LACTATED RINGERS IV SOLN: INTRAVENOUS | Status: DC

## 2023-10-02 MED ORDER — MIDAZOLAM HCL 2 MG/2ML IJ SOLN
INTRAMUSCULAR | Status: AC
  Filled 2023-10-02: qty 2

## 2023-10-02 SURGICAL SUPPLY — 12 items
BAG URO CATCHER STRL LF (MISCELLANEOUS) ×2 IMPLANT
CATH URETL OPEN END 6FR 70 (CATHETERS) IMPLANT
CLOTH BEACON ORANGE TIMEOUT ST (SAFETY) ×2 IMPLANT
GLOVE SURG LX STRL 7.5 STRW (GLOVE) ×2 IMPLANT
GOWN STRL REUS W/ TWL XL LVL3 (GOWN DISPOSABLE) ×2 IMPLANT
GUIDEWIRE STR DUAL SENSOR (WIRE) ×2 IMPLANT
KIT TURNOVER KIT A (KITS) ×2 IMPLANT
MANIFOLD NEPTUNE II (INSTRUMENTS) ×2 IMPLANT
NS IRRIG 1000ML POUR BTL (IV SOLUTION) IMPLANT
PACK CYSTO (CUSTOM PROCEDURE TRAY) ×2 IMPLANT
TRACTIP FLEXIVA PULS ID 200XHI (Laser) IMPLANT
TUBING CONNECTING 10 (TUBING) ×2 IMPLANT

## 2023-10-02 NOTE — Op Note (Addendum)
 Preoperative diagnosis: Bladder neoplasm of uncertain malignant potential Post operative diagnosis: Same  Procedure: TURBT 0.5 to 2 cm, bilateral retrograde pyelogram  Surgeon: Nieves  Anesthesia: General  Indication for procedure: Brandon [name] is a 73 year old male with a history of low-grade TA bladder cancer.  He appears to have an early recurrence near his prior resection site.  Findings: On exam under anesthesia the penis was circumcised and without mass or lesion.  Glans and meatus appeared normal.  Scrotum appeared normal.  On DRE prostate was about 30 g and smooth without hard area or nodule.  On cystoscopy the urethra was unremarkable prostate short and nonobstructive, ureteral orifices in their normal orthotopic position.  The right orifice was slightly more lateral and more slitlike from prior resection.  He flux was clear bilaterally.  Early papillary recurrence lateral to right UO closer to bladder neck.  Left retrograde pyelogram-this outlined a single ureter single collecting system unit without filling defect, stricture or dilation.  Right retrograde pyelogram-this outlined a single ureter single collecting system unit without filling defect, stricture or dilation.  Description of procedure: After consent was obtained patient brought to the operating room.  After adequate anesthesia he is placed in lithotomy position and prepped and draped in the usual sterile fashion.  Timeout was performed to confirm the patient and procedure.  Cystoscope was passed per urethra and the bladder carefully inspected.  I could see the entire bladder and bladder neck well with the 30 degree lens.  Left ureteral orifice was cannulated with a  5 [place] open-ended catheter and left retrograde injection of contrast was performed.  The right ureteral orifice was cannulated with the 5 [place] open-ended catheter and retrograde injection of contrast was performed.  Because the prior resection most of the contrast  washed back out into the bladder and only up into the distal ureter therefore I swapped that out to the 6 [place] but the same phenomenon occurred.  Therefore passed a wire up to the mid ureter and the catheter up to the mid ureter and injected contrast carefully outlined then collecting system.  Also pulled out and injected contrast.  We then watched spot images of the contrast draining through the distal ureter.  The cold cup biopsy forceps were then placed in the lesion was grasped and removed which was 1 cm.  I then swapped out for the continuous-flow sheath with the visual obturator which required dilating the urethral meatus to 26 [place].  I then switched out for the loop and handle and fulgurated the biopsy site.  There were a couple other suspicious areas behind the UO which were fulgurated and 1 posterior.  This was after the bladder had been filled and emptied.  No obvious lesions but the mucosa had changed slightly.  Hemostasis was excellent low-pressure.  The scope was removed.  A 16 French Foley catheter was placed left to gravity drainage.  Exam under anesthesia was performed.  He is awakened taken to recovery room in stable condition.  Instillation of gemcitabine  in PACU: 2000 mg of gemcitabine  was instilled per urethra into the bladder and allowed to dwell for 1 hour.  It was then drained and the Foley catheter removed.  Complications: None  Blood loss: Minimal  Specimens to pathology: Right bladder neck biopsy  Drains: 16 French Foley  Disposition: Patient stable to PACU

## 2023-10-02 NOTE — Anesthesia Procedure Notes (Signed)
 Procedure Name: LMA Insertion Date/Time: 10/02/2023 7:47 AM  Performed by: Vincenzo Show, CRNAPre-anesthesia Checklist: Patient identified, Emergency Drugs available, Suction available, Patient being monitored and Timeout performed Patient Re-evaluated:Patient Re-evaluated prior to induction Oxygen Delivery Method: Circle system utilized Preoxygenation: Pre-oxygenation with 100% oxygen Induction Type: IV induction Ventilation: Mask ventilation without difficulty LMA: LMA inserted LMA Size: 4.0 Tube size: 4.0 mm Number of attempts: 1 Placement Confirmation: positive ETCO2 and breath sounds checked- equal and bilateral Tube secured with: Tape Dental Injury: Teeth and Oropharynx as per pre-operative assessment  Comments: Easy pass.

## 2023-10-02 NOTE — Interval H&P Note (Signed)
 History and Physical Interval Note:  10/02/2023 7:19 AM  Brandon [name]  has presented today for surgery, with the diagnosis of BLADDER CANCER.  The various methods of treatment have been discussed with the patient and family. After consideration of risks, benefits and other options for treatment, the patient has consented to  Procedure(s) with comments: TURBT, WITH CHEMOTHERAPEUTIC AGENT INSTILLATION INTO BLADDER (N/A) - INSTILL GEMCITABINE  CYSTOSCOPY, WITH RETROGRADE PYELOGRAM (Bilateral) as a surgical intervention.  The patient's history has been reviewed, patient examined, no change in status, stable for surgery.  I have reviewed the patient's chart and labs.  Questions were answered to the patient's satisfaction.  No dysuria or gross hematuria. No fever.    [name]

## 2023-10-02 NOTE — Anesthesia Postprocedure Evaluation (Signed)
 Anesthesia Post Note  Patient: NITHIN DEMEO  Procedure(s) Performed: TURBT, WITH CHEMOTHERAPEUTIC AGENT INSTILLATION INTO BLADDER (Pelvis) CYSTOSCOPY, WITH RETROGRADE PYELOGRAM (Bilateral: Pelvis)     Patient location during evaluation: PACU Anesthesia Type: General Level of consciousness: awake and alert Pain management: pain level controlled Vital Signs Assessment: post-procedure vital signs reviewed and stable Respiratory status: spontaneous breathing, nonlabored ventilation, respiratory function stable and patient connected to nasal cannula oxygen Cardiovascular status: blood pressure returned to baseline and stable Postop Assessment: no apparent nausea or vomiting Anesthetic complications: no   No notable events documented.  Last Vitals:  Vitals:   10/02/23 0945 10/02/23 1000  BP: (!) 142/77 (!) 143/72  Pulse: (!) 59 66  Resp:  18  Temp:  36.6 C  SpO2: 95% 99%    Last Pain:  Vitals:   10/02/23 1000  TempSrc:   PainSc: 0-No pain                 Ajai Terhaar

## 2023-10-02 NOTE — Anesthesia Postprocedure Evaluation (Signed)
 Anesthesia Post Note  Patient: Brandon [name]  Procedure(s) Performed: TURBT, WITH CHEMOTHERAPEUTIC AGENT INSTILLATION INTO BLADDER (Pelvis) CYSTOSCOPY, WITH RETROGRADE PYELOGRAM (Bilateral: Pelvis)     Patient location during evaluation: PACU Anesthesia Type: General Level of consciousness: awake and alert Pain management: pain level controlled Vital Signs Assessment: post-procedure vital signs reviewed and stable Respiratory status: spontaneous breathing, nonlabored ventilation, respiratory function stable and patient connected to nasal cannula oxygen Cardiovascular status: blood pressure returned to baseline and stable Postop Assessment: no apparent nausea or vomiting Anesthetic complications: no   No notable events documented.  Last Vitals:  Vitals:   10/02/23 0945 10/02/23 1000  BP: (!) 142/77 (!) 143/72  Pulse: (!) 59 66  Resp:  18  Temp:  36.6 C  SpO2: 95% 99%    Last Pain:  Vitals:   10/02/23 1000  TempSrc:   PainSc: 0-No pain                 Ajai Terhaar

## 2023-10-02 NOTE — Transfer of Care (Signed)
 Immediate Anesthesia Transfer of Care Note  Patient: Brandon [name]  Procedure(s) Performed: Procedure(s) with comments: TURBT, WITH CHEMOTHERAPEUTIC AGENT INSTILLATION INTO BLADDER (N/A) - INSTILL GEMCITABINE  CYSTOSCOPY, WITH RETROGRADE PYELOGRAM (Bilateral)  Patient Location: PACU  Anesthesia Type:General  Level of Consciousness:  sedated, patient cooperative and responds to stimulation  Airway & Oxygen Therapy:Patient Spontanous Breathing and Patient connected to face mask oxgen  Post-op Assessment:  Report given to PACU RN and Post -op Vital signs reviewed and stable  Post vital signs:  Reviewed and stable  Last Vitals:  Vitals:   10/02/23 0551  BP: 127/83  Pulse: 66  Resp: 16  Temp: 36.8 C  SpO2: 98%    Complications: No apparent anesthesia complications

## 2023-10-03 ENCOUNTER — Encounter (HOSPITAL_COMMUNITY): Payer: Self-pay | Admitting: Urology

## 2023-10-03 LAB — SURGICAL PATHOLOGY

## 2023-10-08 DIAGNOSIS — R1310 Dysphagia, unspecified: Secondary | ICD-10-CM | POA: Diagnosis not present

## 2023-10-08 DIAGNOSIS — D490 Neoplasm of unspecified behavior of digestive system: Secondary | ICD-10-CM | POA: Diagnosis not present

## 2023-10-10 ENCOUNTER — Other Ambulatory Visit: Payer: Self-pay | Admitting: Internal Medicine

## 2023-10-10 DIAGNOSIS — K219 Gastro-esophageal reflux disease without esophagitis: Secondary | ICD-10-CM

## 2023-10-16 LAB — LAB COLOGUARD COLON CANCER SCREEN: COLOGUARD: NEGATIVE

## 2023-10-22 DIAGNOSIS — R1310 Dysphagia, unspecified: Secondary | ICD-10-CM | POA: Diagnosis not present

## 2023-10-24 ENCOUNTER — Other Ambulatory Visit: Payer: Self-pay | Admitting: Family Medicine

## 2023-10-24 DIAGNOSIS — M542 Cervicalgia: Secondary | ICD-10-CM

## 2023-10-26 ENCOUNTER — Other Ambulatory Visit: Payer: Self-pay | Admitting: Internal Medicine

## 2023-10-27 ENCOUNTER — Other Ambulatory Visit: Payer: Self-pay | Admitting: Internal Medicine

## 2023-10-27 DIAGNOSIS — J309 Allergic rhinitis, unspecified: Secondary | ICD-10-CM

## 2023-11-05 NOTE — Telephone Encounter (Signed)
 New internal referral placed per Dr. Maggie.

## 2023-11-08 DIAGNOSIS — M1A9XX0 Chronic gout, unspecified, without tophus (tophi): Secondary | ICD-10-CM | POA: Insufficient documentation

## 2023-11-08 DIAGNOSIS — H57813 Brow ptosis, bilateral: Secondary | ICD-10-CM | POA: Diagnosis not present

## 2023-11-08 DIAGNOSIS — H02831 Dermatochalasis of right upper eyelid: Secondary | ICD-10-CM | POA: Diagnosis not present

## 2023-11-08 DIAGNOSIS — I10 Essential (primary) hypertension: Secondary | ICD-10-CM | POA: Insufficient documentation

## 2023-11-08 DIAGNOSIS — H02834 Dermatochalasis of left upper eyelid: Secondary | ICD-10-CM | POA: Diagnosis not present

## 2023-11-08 NOTE — Discharge Instructions (Signed)

## 2023-11-09 ENCOUNTER — Inpatient Hospital Stay
Admission: RE | Admit: 2023-11-09 | Discharge: 2023-11-09 | Disposition: A | Discharge status: Home / Self Care | Source: Ambulatory Visit | Attending: Family Medicine | Admitting: Family Medicine

## 2023-11-13 ENCOUNTER — Inpatient Hospital Stay: Admission: RE | Admit: 2023-11-13 | Source: Ambulatory Visit

## 2023-11-15 DIAGNOSIS — R1314 Dysphagia, pharyngoesophageal phase: Secondary | ICD-10-CM | POA: Insufficient documentation

## 2023-11-15 DIAGNOSIS — Z85819 Personal history of malignant neoplasm of unspecified site of lip, oral cavity, and pharynx: Secondary | ICD-10-CM | POA: Insufficient documentation

## 2023-11-20 ENCOUNTER — Other Ambulatory Visit: Payer: Self-pay

## 2023-11-20 DIAGNOSIS — R1314 Dysphagia, pharyngoesophageal phase: Secondary | ICD-10-CM

## 2023-11-21 ENCOUNTER — Other Ambulatory Visit: Payer: Self-pay | Admitting: Internal Medicine

## 2023-11-21 DIAGNOSIS — E785 Hyperlipidemia, unspecified: Secondary | ICD-10-CM

## 2023-11-23 NOTE — Discharge Instructions (Signed)

## 2023-11-26 ENCOUNTER — Other Ambulatory Visit (HOSPITAL_COMMUNITY): Payer: Self-pay | Admitting: Otolaryngology

## 2023-11-26 ENCOUNTER — Inpatient Hospital Stay
Admission: RE | Admit: 2023-11-26 | Discharge: 2023-11-26 | Disposition: A | Discharge status: Home / Self Care | Source: Ambulatory Visit | Attending: Family Medicine | Admitting: Family Medicine

## 2023-11-26 DIAGNOSIS — R1314 Dysphagia, pharyngoesophageal phase: Secondary | ICD-10-CM

## 2023-11-27 ENCOUNTER — Ambulatory Visit (HOSPITAL_COMMUNITY)
Admission: RE | Admit: 2023-11-27 | Discharge: 2023-11-27 | Disposition: A | Discharge status: Home / Self Care | Source: Ambulatory Visit

## 2023-11-27 DIAGNOSIS — R1314 Dysphagia, pharyngoesophageal phase: Secondary | ICD-10-CM | POA: Diagnosis not present

## 2023-11-27 DIAGNOSIS — R1310 Dysphagia, unspecified: Secondary | ICD-10-CM | POA: Diagnosis not present

## 2023-11-27 DIAGNOSIS — K224 Dyskinesia of esophagus: Secondary | ICD-10-CM | POA: Diagnosis not present

## 2023-11-30 ENCOUNTER — Encounter: Payer: Self-pay | Admitting: Family Medicine

## 2023-12-04 ENCOUNTER — Telehealth: Payer: Self-pay

## 2023-12-04 NOTE — Telephone Encounter (Signed)
 Epidural denial,need to do an appeal, but I need information added to the patient charts.   The notes do not show why a repeat shot would be helpful. A repeat shot can be done if you reported at least 50 percent decrease pain or are able to perform your daily tasks 50 percent better. This relief must have lasted for at least three months.   Let me know when this is done and I can appeal this denial.

## 2023-12-07 NOTE — Telephone Encounter (Signed)
 Are you able to do this before they close this case?

## 2023-12-14 ENCOUNTER — Other Ambulatory Visit: Payer: Self-pay | Admitting: Internal Medicine

## 2023-12-14 DIAGNOSIS — E039 Hypothyroidism, unspecified: Secondary | ICD-10-CM

## 2023-12-20 ENCOUNTER — Other Ambulatory Visit

## 2023-12-21 NOTE — Telephone Encounter (Signed)
 Faxed letter to appeals received a fax asking for more information. Have faxed all the office notes and PT from last year since patient has not been seen this year.   Waiting for a reply

## 2023-12-25 DIAGNOSIS — Z008 Encounter for other general examination: Secondary | ICD-10-CM | POA: Diagnosis not present

## 2023-12-26 ENCOUNTER — Ambulatory Visit: Admitting: Internal Medicine

## 2023-12-26 ENCOUNTER — Ambulatory Visit (INDEPENDENT_AMBULATORY_CARE_PROVIDER_SITE_OTHER): Admitting: Internal Medicine

## 2023-12-26 VITALS — BP 134/86 | HR 58 | Temp 98.1°F | Ht 70.0 in | Wt 169.0 lb

## 2023-12-26 DIAGNOSIS — R1310 Dysphagia, unspecified: Secondary | ICD-10-CM | POA: Diagnosis not present

## 2023-12-26 DIAGNOSIS — I10 Essential (primary) hypertension: Secondary | ICD-10-CM | POA: Diagnosis not present

## 2023-12-26 DIAGNOSIS — R7303 Prediabetes: Secondary | ICD-10-CM | POA: Insufficient documentation

## 2023-12-26 DIAGNOSIS — R7989 Other specified abnormal findings of blood chemistry: Secondary | ICD-10-CM

## 2023-12-26 DIAGNOSIS — I5032 Chronic diastolic (congestive) heart failure: Secondary | ICD-10-CM | POA: Diagnosis not present

## 2023-12-26 DIAGNOSIS — E1159 Type 2 diabetes mellitus with other circulatory complications: Secondary | ICD-10-CM

## 2023-12-26 DIAGNOSIS — M1A00X0 Idiopathic chronic gout, unspecified site, without tophus (tophi): Secondary | ICD-10-CM | POA: Diagnosis not present

## 2023-12-26 DIAGNOSIS — E039 Hypothyroidism, unspecified: Secondary | ICD-10-CM

## 2023-12-26 DIAGNOSIS — Z77011 Contact with and (suspected) exposure to lead: Secondary | ICD-10-CM

## 2023-12-26 DIAGNOSIS — E785 Hyperlipidemia, unspecified: Secondary | ICD-10-CM

## 2023-12-26 DIAGNOSIS — R945 Abnormal results of liver function studies: Secondary | ICD-10-CM

## 2023-12-26 LAB — TSH: TSH: 1.18 u[IU]/mL (ref 0.35–5.50)

## 2023-12-26 LAB — BASIC METABOLIC PANEL WITH GFR
BUN: 24 mg/dL — ABNORMAL HIGH (ref 6–23)
CO2: 22 meq/L (ref 19–32)
Calcium: 9.3 mg/dL (ref 8.4–10.5)
Chloride: 106 meq/L (ref 96–112)
Creatinine, Ser: 1.06 mg/dL (ref 0.40–1.50)
GFR: 70.51 mL/min (ref >60.00)
Glucose, Bld: 120 mg/dL — ABNORMAL HIGH (ref 70–99)
Potassium: 4.1 meq/L (ref 3.5–5.1)
Sodium: 140 meq/L (ref 135–145)

## 2023-12-26 LAB — URINALYSIS, ROUTINE W REFLEX MICROSCOPIC
Bilirubin Urine: NEGATIVE
Hgb urine dipstick: NEGATIVE
Ketones, ur: NEGATIVE
Leukocytes,Ua: NEGATIVE
Nitrite: NEGATIVE
Specific Gravity, Urine: >=1.03 — AB (ref 1.000–1.030)
Total Protein, Urine: NEGATIVE
Urine Glucose: NEGATIVE
Urobilinogen, UA: 0.2 (ref 0.0–1.0)
pH: 5.5 (ref 5.0–8.0)

## 2023-12-26 LAB — CBC WITH DIFFERENTIAL/PLATELET
Basophils Absolute: 0.1 K/uL (ref 0.0–0.1)
Basophils Relative: 1.4 % (ref 0.0–3.0)
Eosinophils Absolute: 0.2 K/uL (ref 0.0–0.7)
Eosinophils Relative: 3.5 % (ref 0.0–5.0)
HCT: 41.3 % (ref 39.0–52.0)
Hemoglobin: 13.8 g/dL (ref 13.0–17.0)
Lymphocytes Relative: 13.6 % (ref 12.0–46.0)
Lymphs Abs: 0.8 K/uL (ref 0.7–4.0)
MCHC: 33.4 g/dL (ref 30.0–36.0)
MCV: 92.1 fl (ref 78.0–100.0)
Monocytes Absolute: 0.4 K/uL (ref 0.1–1.0)
Monocytes Relative: 6.2 % (ref 3.0–12.0)
Neutro Abs: 4.5 K/uL (ref 1.4–7.7)
Neutrophils Relative %: 75.3 % (ref 43.0–77.0)
Platelets: 331 K/uL (ref 150.0–400.0)
RBC: 4.48 Mil/uL (ref 4.22–5.81)
RDW: 16.9 % — ABNORMAL HIGH (ref 11.5–15.5)
WBC: 6 K/uL (ref 4.0–10.5)

## 2023-12-26 LAB — PROTIME-INR
INR: 1 ratio (ref 0.8–1.0)
Prothrombin Time: 11 s (ref 9.6–13.1)

## 2023-12-26 LAB — HEPATIC FUNCTION PANEL
ALT: 20 U/L (ref 0–53)
AST: 32 U/L (ref 0–37)
Albumin: 4.2 g/dL (ref 3.5–5.2)
Alkaline Phosphatase: 81 U/L (ref 39–117)
Bilirubin, Direct: 0.1 mg/dL (ref 0.0–0.3)
Total Bilirubin: 0.3 mg/dL (ref 0.2–1.2)
Total Protein: 6.8 g/dL (ref 6.0–8.3)

## 2023-12-26 LAB — URIC ACID: Uric Acid, Serum: 7 mg/dL (ref 4.0–7.8)

## 2023-12-26 LAB — MAGNESIUM: Magnesium: 1.4 mg/dL — ABNORMAL LOW (ref 1.5–2.5)

## 2023-12-26 LAB — HEMOGLOBIN A1C: Hgb A1c MFr Bld: 6.5 % (ref 4.6–6.5)

## 2023-12-26 MED ORDER — EMPAGLIFLOZIN 10 MG PO TABS: 10.0000 mg | ORAL_TABLET | Freq: Every day | ORAL | 0 refills | Status: DC

## 2023-12-26 MED ORDER — MAGNESIUM OXIDE -MG SUPPLEMENT 400 (240 MG) MG PO TABS: 400.0000 mg | ORAL_TABLET | Freq: Every day | ORAL | 1 refills | Status: AC

## 2023-12-26 MED ORDER — ROSUVASTATIN CALCIUM 20 MG PO TABS: 20.0000 mg | ORAL_TABLET | Freq: Every day | ORAL | 0 refills | Status: DC

## 2023-12-26 NOTE — Patient Instructions (Signed)

## 2023-12-26 NOTE — Progress Notes (Signed)
 Subjective:  Patient ID: Brandon [name], male    DOB: 03-Nov-1950  Age: 73 y.o. MRN: [id number]  CC: Hypertension, Hypothyroidism, and Gastroesophageal Reflux   HPI Brandon [name] presents for f/up ---    Discussed the use of AI scribe software for clinical note transcription with the patient, who gave verbal consent to proceed.  History of Present Illness Brandon [name] is a 73 year old male who presents with difficulty swallowing and weight loss.  He experiences difficulty swallowing pills and solid foods, but not liquids. He describes episodes where food or pills feel stuck, requiring him to swallow again. He recalls a specific incident with lump crab causing a sensation of a large obstruction in his throat. No pain is associated with swallowing, but he eats small portions to ensure proper swallowing.  A swallowing test was conducted and was reported as normal, though an abnormality was found. His medication, epazole, has been increased to twice daily.  He denies coughing, bringing anything up, or experiencing bloody sputum, but occasionally spits mucus. He has experienced some weight loss, though the amount is unspecified.  No dizziness, lightheadedness, swelling in the legs or feet, abdominal pain, blood in the stool, or bladder issues.     Outpatient Medications Prior to Visit  Medication Sig Dispense Refill   allopurinol  (ZYLOPRIM ) 300 MG tablet Take 1 tablet (300 mg total) by mouth daily. 90 tablet 3   aspirin  EC 81 MG tablet Take 1 tablet (81 mg total) by mouth daily. Swallow whole.     candesartan  (ATACAND ) 32 MG tablet Take 1 tablet (32 mg total) by mouth daily. 90 tablet 1   colchicine  0.6 MG tablet Take 1 tablet (0.6 mg total) by mouth daily as needed (gout or psuedogout pain). 30 tablet 2   fluticasone  (FLONASE ) 50 MCG/ACT nasal spray SPRAY 2 SPRAYS INTO EACH NOSTRIL EVERY DAY 48 mL 1   levothyroxine  (SYNTHROID ) 100 MCG tablet TAKE 1 TABLET BY MOUTH EVERY DAY 90  tablet 0   omeprazole  (PRILOSEC) 40 MG capsule TAKE 1 CAPSULE (40 MG TOTAL) BY MOUTH DAILY. 90 capsule 0   primidone  (MYSOLINE ) 50 MG tablet Take 1 tablet (50 mg total) by mouth 2 (two) times daily. 180 tablet 2   thiamine  (VITAMIN B-1) 50 MG tablet Take 1 tablet (50 mg total) by mouth daily. 90 tablet 1   zolpidem  (AMBIEN ) 10 MG tablet Take 1 tablet (10 mg total) by mouth at bedtime as needed. for sleep 90 tablet 0   MAGnesium -Oxide 400 (240 Mg) MG tablet Take 1 tablet (400 mg total) by mouth daily. 90 tablet 1   rosuvastatin  (CRESTOR ) 20 MG tablet TAKE 1 TABLET BY MOUTH EVERY DAY 90 tablet 0   bisoprolol  (ZEBETA ) 5 MG tablet TAKE 1 TABLET (5 MG TOTAL) BY MOUTH DAILY. (Patient not taking: Reported on 12/26/2023) 90 tablet 0   indapamide  (LOZOL ) 1.25 MG tablet Take 1 tablet (1.25 mg total) by mouth daily. 90 tablet 0   No facility-administered medications prior to visit.    ROS Review of Systems  Constitutional:  Negative for appetite change, chills, diaphoresis, fatigue and fever.  HENT:  Positive for trouble swallowing. Negative for sinus pressure.   Respiratory:  Negative for cough, chest tightness, shortness of breath and wheezing.   Cardiovascular:  Negative for chest pain, palpitations and leg swelling.  Gastrointestinal: Negative.  Negative for abdominal pain, constipation, diarrhea, nausea and vomiting.  Genitourinary: Negative.  Negative for difficulty urinating.  Musculoskeletal:  Positive  for arthralgias. Negative for myalgias.  Skin: Negative.   Neurological:  Negative for dizziness and weakness.  Hematological:  Negative for adenopathy. Does not bruise/bleed easily.  Psychiatric/Behavioral: Negative.      Objective:  BP 134/86 (BP Location: Left Arm, Patient Position: Sitting, Cuff Size: Normal)   Pulse (!) 58   Temp 98.1 F (36.7 C) (Oral)   Ht 5' 10 (1.778 m)   Wt 169 lb (76.7 kg)   SpO2 97%   BMI 24.25 kg/m   BP Readings from Last 3 Encounters:  12/26/23 134/86   10/02/23 (!) 146/86  09/28/23 139/72    Wt Readings from Last 3 Encounters:  12/26/23 169 lb (76.7 kg)  10/02/23 173 lb (78.5 kg)  09/28/23 173 lb (78.5 kg)    Physical Exam Vitals reviewed.  Constitutional:      Appearance: Normal appearance.  HENT:     Nose: Nose normal.     Mouth/Throat:     Mouth: Mucous membranes are moist.  Eyes:     General: No scleral icterus.    Conjunctiva/sclera: Conjunctivae normal.  Cardiovascular:     Rate and Rhythm: Regular rhythm. Bradycardia present.     Heart sounds: No murmur heard.    No friction rub. No gallop.     Comments: He refused an EKG today Pulmonary:     Breath sounds: No stridor. No wheezing, rhonchi or rales.  Abdominal:     General: Abdomen is flat.     Palpations: There is no mass.     Tenderness: There is no abdominal tenderness. There is no guarding.     Hernia: No hernia is present.  Musculoskeletal:        General: No swelling or deformity. Normal range of motion.     Cervical back: Neck supple.     Right lower leg: No edema.     Left lower leg: No edema.  Lymphadenopathy:     Cervical: No cervical adenopathy.  Skin:    Coloration: Skin is not pale.     Findings: No rash.  Neurological:     Mental Status: He is alert. Mental status is at baseline.     Lab Results  Component Value Date   WBC 6.0 12/26/2023   HGB 13.8 12/26/2023   HCT 41.3 12/26/2023   PLT 331.0 12/26/2023   GLUCOSE 120 (H) 12/26/2023   CHOL 151 07/19/2023   TRIG 78.0 07/19/2023   HDL 88.10 07/19/2023   LDLDIRECT 147.7 12/03/2007   LDLCALC 47 07/19/2023   ALT 20 12/26/2023   AST 32 12/26/2023   NA 140 12/26/2023   K 4.1 12/26/2023   CL 106 12/26/2023   CREATININE 1.06 12/26/2023   BUN 24 (H) 12/26/2023   CO2 22 12/26/2023   TSH 1.18 12/26/2023   PSA 1.86 07/19/2023   INR 1.0 12/26/2023   HGBA1C 6.5 12/26/2023    DG ESOPHAGUS W DOUBLE CM (HD) Result Date: 11/27/2023 CLINICAL DATA:  73 year old male with  pharyngoesophageal dysphagia and history of neck cancer status post radiation for diagnostics esophagram. EXAM: ESOPHAGUS/BARIUM SWALLOW/TABLET STUDY TECHNIQUE: Combined double and single contrast examination was performed using effervescent crystals, high-density barium, and thin liquid barium. This exam was performed by Abigail C. Augusta, PA-C, and was supervised and interpreted by Dr. Karalee. FLUOROSCOPY: Radiation Exposure Index (as provided by the fluoroscopic device): 37.7 mGy Kerma COMPARISON:  None Available. FINDINGS: Swallowing: Appears normal. No vestibular penetration or aspiration seen. Pharynx: Unremarkable. Esophagus: Normal appearance, no mucosal lesions  or strictures visualized. Esophageal motility: Esophageal dysmotility noted, tertiary contractions throughout distal third of esophagus. Hiatal Hernia: None visualized. Gastroesophageal reflux: None visualized, even with provocative maneuvers. Ingested 13 mm barium tablet: Passed normally. Other: None. IMPRESSION: No mucosal lesions or strictures visualized; mild intermittent esophageal dysmotility noted with tertiary contractions seen throughout distal third of the esophagus. Otherwise normal esophagram. Performed By Lavanda Jurist, PA-C Electronically Signed   By: Wilkie Lent M.D.   On: 11/27/2023 10:20    Fibrosis 4 Score = 1.53  Fib-4 interpretation is not validated for people under 35 or over 60 years of age. However, scores under 2.0 are generally considered low risk.   Assessment & Plan:  Chronic heart failure with preserved ejection fraction (HCC) -     Empagliflozin ; Take 1 tablet (10 mg total) by mouth daily.  Dispense: 90 tablet; Refill: 0  Hypertension, unspecified type -     Basic metabolic panel with GFR; Future -     Urinalysis, Routine w reflex microscopic; Future -     CBC with Differential/Platelet; Future  Pill dysphagia -     Ambulatory referral to Gastroenterology  Elevated LFTs -     Hepatic  function panel; Future -     Protime-INR; Future  Idiopathic chronic gout without tophus, unspecified site -     Uric acid; Future  Essential hypertension, benign  Lead exposure -     Lead, blood (adult age 42 yrs or greater); Future -     CBC with Differential/Platelet; Future  Hypomagnesemia -     Magnesium ; Future -     Magnesium  Oxide -Mg Supplement; Take 1 tablet (400 mg total) by mouth daily.  Dispense: 90 tablet; Refill: 1  Acquired hypothyroidism -     TSH; Future  Prediabetes -     Basic metabolic panel with GFR; Future -     Hemoglobin A1c; Future  Type 2 diabetes mellitus with other circulatory complication, without long-term current use of insulin (HCC) -     Empagliflozin ; Take 1 tablet (10 mg total) by mouth daily.  Dispense: 90 tablet; Refill: 0  Hyperlipidemia with target LDL less than 130 -     Rosuvastatin  Calcium ; Take 1 tablet (20 mg total) by mouth daily.  Dispense: 90 tablet; Refill: 0     Follow-up: Return in about 6 months (around 06/24/2024).  Debby Molt, MD

## 2023-12-28 ENCOUNTER — Ambulatory Visit: Payer: Self-pay | Admitting: Internal Medicine

## 2023-12-29 LAB — LEAD, BLOOD (ADULT >= 16 YRS): Lead: 14.6 ug/dL — ABNORMAL HIGH (ref <3.5)

## 2024-01-03 DIAGNOSIS — Z8551 Personal history of malignant neoplasm of bladder: Secondary | ICD-10-CM | POA: Diagnosis not present

## 2024-01-08 ENCOUNTER — Other Ambulatory Visit (HOSPITAL_COMMUNITY)

## 2024-01-09 ENCOUNTER — Encounter: Payer: Self-pay | Admitting: Physician Assistant

## 2024-01-10 ENCOUNTER — Encounter (HOSPITAL_COMMUNITY): Payer: Self-pay

## 2024-01-10 DIAGNOSIS — H02831 Dermatochalasis of right upper eyelid: Secondary | ICD-10-CM | POA: Diagnosis not present

## 2024-01-10 DIAGNOSIS — H02834 Dermatochalasis of left upper eyelid: Secondary | ICD-10-CM | POA: Diagnosis not present

## 2024-01-14 ENCOUNTER — Ambulatory Visit: Payer: Self-pay | Admitting: Internal Medicine

## 2024-01-14 ENCOUNTER — Ambulatory Visit (HOSPITAL_COMMUNITY)
Admission: RE | Admit: 2024-01-14 | Discharge: 2024-01-14 | Disposition: A | Discharge status: Home / Self Care | Source: Ambulatory Visit | Attending: Internal Medicine | Admitting: Internal Medicine

## 2024-01-14 DIAGNOSIS — R0609 Other forms of dyspnea: Secondary | ICD-10-CM

## 2024-01-14 DIAGNOSIS — I2510 Atherosclerotic heart disease of native coronary artery without angina pectoris: Secondary | ICD-10-CM | POA: Insufficient documentation

## 2024-01-14 DIAGNOSIS — I77810 Thoracic aortic ectasia: Secondary | ICD-10-CM | POA: Insufficient documentation

## 2024-01-14 DIAGNOSIS — I5032 Chronic diastolic (congestive) heart failure: Secondary | ICD-10-CM | POA: Diagnosis not present

## 2024-01-14 MED ORDER — IOHEXOL 350 MG/ML SOLN
100.0000 mL | Freq: Once | INTRAVENOUS | Status: AC | PRN
  Administered 2024-01-14: 100 mL via INTRAVENOUS

## 2024-01-14 MED ORDER — NITROGLYCERIN 0.4 MG SL SUBL
0.8000 mg | SUBLINGUAL_TABLET | Freq: Once | SUBLINGUAL | Status: AC
  Administered 2024-01-14: 0.8 mg via SUBLINGUAL

## 2024-01-24 ENCOUNTER — Encounter: Payer: Self-pay | Admitting: Neurology

## 2024-01-25 NOTE — Telephone Encounter (Signed)
 Is this Lab work needed before patient comes to appointment . I was asking about the lab work that is showing up, Immunofixation Serum and Electrophoresis Serum due before 02/13/24.

## 2024-01-30 ENCOUNTER — Encounter: Payer: Self-pay | Admitting: Internal Medicine

## 2024-01-30 ENCOUNTER — Other Ambulatory Visit: Payer: Self-pay | Admitting: Family Medicine

## 2024-01-30 ENCOUNTER — Other Ambulatory Visit: Payer: Self-pay | Admitting: Internal Medicine

## 2024-01-30 NOTE — Telephone Encounter (Signed)
 Last OV 12/06/22  Pt has not been seen in > 1 year, needs OV.

## 2024-01-31 ENCOUNTER — Other Ambulatory Visit: Payer: Self-pay

## 2024-01-31 DIAGNOSIS — F5101 Primary insomnia: Secondary | ICD-10-CM

## 2024-01-31 MED ORDER — ZOLPIDEM TARTRATE 10 MG PO TABS: 10.0000 mg | ORAL_TABLET | Freq: Every evening | ORAL | 0 refills | Status: DC | PRN

## 2024-02-04 ENCOUNTER — Encounter: Payer: Self-pay | Admitting: Internal Medicine

## 2024-02-06 ENCOUNTER — Ambulatory Visit
Admission: RE | Admit: 2024-02-06 | Discharge: 2024-02-06 | Disposition: A | Discharge status: Home / Self Care | Source: Ambulatory Visit

## 2024-02-06 ENCOUNTER — Other Ambulatory Visit: Payer: Self-pay | Admitting: Internal Medicine

## 2024-02-06 ENCOUNTER — Telehealth: Payer: Self-pay

## 2024-02-06 VITALS — BP 138/79 | HR 92 | Temp 97.7°F | Resp 17

## 2024-02-06 DIAGNOSIS — I5032 Chronic diastolic (congestive) heart failure: Secondary | ICD-10-CM

## 2024-02-06 DIAGNOSIS — K4090 Unilateral inguinal hernia, without obstruction or gangrene, not specified as recurrent: Secondary | ICD-10-CM | POA: Diagnosis not present

## 2024-02-06 DIAGNOSIS — I10 Essential (primary) hypertension: Secondary | ICD-10-CM

## 2024-02-06 MED ORDER — BISOPROLOL FUMARATE 2.5 MG PO TABS: 2.5000 mg | ORAL_TABLET | Freq: Every day | ORAL | 0 refills | Status: DC

## 2024-02-06 NOTE — Telephone Encounter (Signed)
 Copied from CRM [id number]. Topic: Appointments - Transfer of Care >> Feb 06, 2024 12:21 PM [facility] wrote: Pt is requesting to transfer FROM: Dr. Joshua  Pt is requesting to transfer TO: Dr. Thedora Reason for requested transfer: Pt preference It is the responsibility of the team the patient would like to transfer to (Dr. Thedora) to reach out to the patient if for any reason this transfer is not acceptable.

## 2024-02-06 NOTE — ED Provider Notes (Signed)
 [facility]  Note:  This document was prepared using Dragon voice recognition software and may include unintentional dictation errors.  MRN: [id number] DOB: Feb 25, 1951  Subjective:   Brandon [name] is a 73 y.o. male presenting for acute onset right groin pain that started yesterday.  Patient reports that pain waxes and wanes.  Patient states that his wife assessed area of discomfort and states that it looks a little bit puffy.  Patient was concern for possible right inguinal hernia.  Patient denies any fever, severe abdominal pain, flank pain, dysuria, increased urinary frequency, penile discharge, constipation, diarrhea.  Patient denies any pain currently and states that the bulge that he was seeing in the right inguinal area is not currently there.  No current facility-administered medications for this encounter.  Current Outpatient Medications:    allopurinol  (ZYLOPRIM ) 300 MG tablet, Take 1 tablet (300 mg total) by mouth daily., Disp: 90 tablet, Rfl: 3   aspirin  EC 81 MG tablet, Take 1 tablet (81 mg total) by mouth daily. Swallow whole., Disp: , Rfl:    Bisoprolol  Fumarate 2.5 MG TABS, Take 2.5 mg by mouth daily., Disp: 90 tablet, Rfl: 0   candesartan  (ATACAND ) 32 MG tablet, Take 1 tablet (32 mg total) by mouth daily., Disp: 90 tablet, Rfl: 1   colchicine  0.6 MG tablet, Take 1 tablet (0.6 mg total) by mouth daily as needed (gout or psuedogout pain)., Disp: 30 tablet, Rfl: 2   empagliflozin  (JARDIANCE ) 10 MG TABS tablet, Take 1 tablet (10 mg total) by mouth daily., Disp: 90 tablet, Rfl: 0   fluticasone  (FLONASE ) 50 MCG/ACT nasal spray, SPRAY 2 SPRAYS INTO EACH NOSTRIL EVERY DAY, Disp: 48 mL, Rfl: 1   levothyroxine  (SYNTHROID ) 100 MCG tablet, TAKE 1 TABLET BY MOUTH EVERY DAY, Disp: 90 tablet, Rfl: 0   MAGnesium -Oxide 400 (240 Mg) MG tablet, Take 1 tablet (400 mg total) by mouth daily., Disp: 90 tablet, Rfl: 1   omeprazole  (PRILOSEC) 40 MG capsule, TAKE 1 CAPSULE (40  MG TOTAL) BY MOUTH DAILY., Disp: 90 capsule, Rfl: 0   primidone  (MYSOLINE ) 50 MG tablet, Take 1 tablet (50 mg total) by mouth 2 (two) times daily., Disp: 180 tablet, Rfl: 2   rosuvastatin  (CRESTOR ) 20 MG tablet, Take 1 tablet (20 mg total) by mouth daily., Disp: 90 tablet, Rfl: 0   thiamine  (VITAMIN B-1) 50 MG tablet, Take 1 tablet (50 mg total) by mouth daily., Disp: 90 tablet, Rfl: 1   zolpidem  (AMBIEN ) 10 MG tablet, Take 1 tablet (10 mg total) by mouth at bedtime as needed. for sleep, Disp: 90 tablet, Rfl: 0   Allergies  Allergen Reactions   Benicar  [Olmesartan ]     fatigue   Amlodipine  Swelling    Past Medical History:  Diagnosis Date   Allergy    mild   Aortic aneurysm    small and watching. followed by his PCP Dr. debby Molt, 02/06/2022   Arthritis    GERD (gastroesophageal reflux disease)    Head and neck cancer    head and neck ca dx 03/2009   Hyperlipidemia    Hypertension    Hypothyroidism    oropharyngeal ca dx'd 03/2009   chemo/xrt comp 06/2009   Swallowing difficulty    due to his cancer in neck and throat sometimes has problems swalling pills and had problem intubation due to radion to neck are. 02/06/2022   Thyroid  disease    Wears glasses    reading 02/06/2022     Past Surgical  History:  Procedure Laterality Date   CATARACT EXTRACTION Right    had it a couple of months ago   02/06/2022   CYSTOSCOPY W/ RETROGRADES Bilateral 10/02/2023   Procedure: CYSTOSCOPY, WITH RETROGRADE PYELOGRAM;  Surgeon: Nieves Cough, MD;  Location: [facility];  Service: Urology;  Laterality: Bilateral;   HERNIA REPAIR     inguinal   hip replacemnt left Left    Left eye     LUMBAR LAMINECTOMY     stmach surgery     repair peg tube site   TRANSURETHRAL RESECTION OF BLADDER TUMOR WITH MITOMYCIN -C Bilateral 02/10/2022   Procedure: TRANSURETHRAL RESECTION OF BLADDER TUMOR  AND  POST OP INSTILLATION OF GEMCITABINE ;  Surgeon: Nieves Cough, MD;  Location: [facility];  Service: Urology;  Laterality: Bilateral;  1 HR FOR CASE    Family History  Problem Relation Age of Onset   Cancer Mother        lymphoma   Colon cancer Mother    Cancer Father        lung (smoker)   Hyperlipidemia Father    Hypertension Father    Diabetes Father    Esophageal cancer Neg Hx    Rectal cancer Neg Hx    Stomach cancer Neg Hx     Social History   Tobacco Use   Smoking status: Former    Current packs/day: 0.00    Average packs/day: 1.5 packs/day for 35.9 years (53.9 ttl pk-yrs)    Types: Cigarettes, Cigars    Start date: 05/16/1973    Quit date: 04/19/2009    Years since quitting: 14.8   Smokeless tobacco: Never   Tobacco comments:    quit smoking 5 years sago  Vaping Use   Vaping status: Never Used  Substance Use Topics   Alcohol use: Yes    Alcohol/week: 2.0 standard drinks of alcohol    Types: 2 Shots of liquor per week    Comment: 4 drinks a day   Drug use: No    ROS Refer to HPI for ROS details.  Objective:   Vitals: BP 138/79 (BP Location: Right Arm)   Pulse 92   Temp 97.7 F (36.5 C) (Oral)   Resp 17   SpO2 96%   Physical Exam Vitals and nursing note reviewed.  Constitutional:      General: He is not in acute distress.    Appearance: He is well-developed. He is not ill-appearing or toxic-appearing.  HENT:     Head: Normocephalic.  Cardiovascular:     Rate and Rhythm: Normal rate.  Pulmonary:     Effort: Pulmonary effort is normal. No respiratory distress.     Breath sounds: No stridor. No wheezing.  Abdominal:     General: There is no distension.     Palpations: Abdomen is soft. There is no mass.     Tenderness: There is no abdominal tenderness. There is no right CVA tenderness, left CVA tenderness, guarding or rebound.     Hernia: A hernia is present.  Skin:    General: Skin is warm and dry.  Neurological:     General: No focal deficit present.     Mental Status: He is alert and oriented to person, place, and time.   Psychiatric:        Mood and Affect: Mood normal.        Behavior: Behavior normal.     Procedures  No results found for this or any previous visit (from the past  24 hours).  No results found.   Assessment and Plan :     Discharge Instructions       1. Right inguinal hernia (Primary) - Ambulatory referral to General Surgery for follow-up evaluation management of right inguinal hernia should symptoms worsen. - General Surgery referral is good for 1 calendar year, if you develop any worsening symptoms such as increased pain, increased swelling, nonreducible inguinal hernia follow-up with general surgery for further evaluation. - If you develop any symptoms of pain with urination, increased urinary output, urinary hesitancy, urinary retention, severe abdominal pain, flank pain, blood in urine follow-up for further evaluation and treatment in urgent care or ER.      Vesta Wheeland B Kenise Barraco   Rudolph Dobler, [place], [place] 02/06/24 [phone]

## 2024-02-06 NOTE — Discharge Instructions (Addendum)
  1. Right inguinal hernia (Primary) - Ambulatory referral to General Surgery for follow-up evaluation management of right inguinal hernia should symptoms worsen. - General Surgery referral is good for 1 calendar year, if you develop any worsening symptoms such as increased pain, increased swelling, nonreducible inguinal hernia follow-up with general surgery for further evaluation. - If you develop any symptoms of pain with urination, increased urinary output, urinary hesitancy, urinary retention, severe abdominal pain, flank pain, blood in urine follow-up for further evaluation and treatment in urgent care or ER.

## 2024-02-06 NOTE — ED Triage Notes (Signed)
 Pt c/o lower right side pain that comes and goes since yesterday. His wife also noticed he had some puffiness in right lower side.

## 2024-02-12 ENCOUNTER — Other Ambulatory Visit: Payer: Self-pay | Admitting: Internal Medicine

## 2024-02-12 DIAGNOSIS — I10 Essential (primary) hypertension: Secondary | ICD-10-CM

## 2024-02-12 DIAGNOSIS — I5032 Chronic diastolic (congestive) heart failure: Secondary | ICD-10-CM

## 2024-02-12 MED ORDER — BISOPROLOL FUMARATE 5 MG PO TABS: 5.0000 mg | ORAL_TABLET | Freq: Every day | ORAL | 0 refills | Status: DC

## 2024-02-19 ENCOUNTER — Ambulatory Visit
Admission: RE | Admit: 2024-02-19 | Discharge: 2024-02-19 | Disposition: A | Discharge status: Home / Self Care | Source: Ambulatory Visit | Attending: Physician Assistant | Admitting: Physician Assistant

## 2024-02-19 ENCOUNTER — Telehealth: Payer: Self-pay

## 2024-02-19 ENCOUNTER — Other Ambulatory Visit: Payer: Self-pay

## 2024-02-19 VITALS — BP 182/94 | HR 65 | Temp 97.6°F | Resp 18 | Ht 70.0 in | Wt 169.0 lb

## 2024-02-19 DIAGNOSIS — R053 Chronic cough: Secondary | ICD-10-CM

## 2024-02-19 DIAGNOSIS — R09A2 Foreign body sensation, throat: Secondary | ICD-10-CM | POA: Diagnosis not present

## 2024-02-19 MED ORDER — SUCRALFATE 1 G PO TABS: 1.0000 g | ORAL_TABLET | Freq: Three times a day (TID) | ORAL | 0 refills | Status: AC

## 2024-02-19 NOTE — ED Provider Notes (Signed)
 [facility]    CSN: [id number] Arrival date & time: 02/19/24  1030      History   Chief Complaint Chief Complaint  Patient presents with   Wheezing    Having difficulty swallowing and now  strated wheezing. - Entered by patient    HPI Brandon [name] is a 73 y.o. male.  has a past medical history of Allergy, Aortic aneurysm, Arthritis, GERD (gastroesophageal reflux disease), Head and neck cancer, Hyperlipidemia, Hypertension, Hypothyroidism, oropharyngeal ca (dx'd 03/2009), Swallowing difficulty, Thyroid  disease, and Wears glasses.   HPI  Discussed the use of AI scribe software for clinical note transcription with the patient, who gave verbal consent to proceed.  The patient, with a history of esophageal issues, presents with chronic throat issues and new onset wheezing. He is accompanied by his wife.  He has been experiencing chronic throat issues for several months, including choking, coughing up food, and difficulty breathing when lying down. These symptoms have disrupted his sleep, and the recent development of wheezing prompted the visit to urgent care. Despite undergoing a swallow test and consulting a throat specialist, the patient reports that no cause has been found so far. He was started on omeprazole  40 mg at night for potential acid reflux, but this has not resolved his symptoms.  He describes a persistent need to clear his throat and frequent spitting, with a raspy throat and congestion. About ten minutes after eating, he often coughs up chunks of food. He has been using Mucinex and Flonase , and has tried Zyrtec and Claritin without significant improvement. He also uses a neti pot for nasal congestion.  He mentions a history of esophageal issues, including a suggestion from a doctor that he might have a stricture that could require stretching. He has a hernia and is scheduled to see a surgeon who also deals with throat issues. He has been on omeprazole  40 mg  since June, but has not noticed a significant difference in his symptoms.  His symptoms have been progressively worsening over the past year, with the recent development of wheezing being a new concern. He has a personal history of lung and throat cancer, which is a concern for his family, particularly his wife.  He notes that his work environment contains black mold, which he wonders might be contributing to his symptoms. No swelling in feet or palpitations. Reports nasal congestion and frequent throat clearing. No significant improvement with allergy medications.    Past Medical History:  Diagnosis Date   Allergy    mild   Aortic aneurysm    small and watching. followed by his PCP Dr. debby Molt, 02/06/2022   Arthritis    GERD (gastroesophageal reflux disease)    Head and neck cancer    head and neck ca dx 03/2009   Hyperlipidemia    Hypertension    Hypothyroidism    oropharyngeal ca dx'd 03/2009   chemo/xrt comp 06/2009   Swallowing difficulty    due to his cancer in neck and throat sometimes has problems swalling pills and had problem intubation due to radion to neck are. 02/06/2022   Thyroid  disease    Wears glasses    reading 02/06/2022    Patient Active Problem List   Diagnosis Date Noted   Pill dysphagia 12/26/2023   Chronic gout without tophus 11/08/2023   Elevated LFTs 07/19/2023   (HFpEF) heart failure with preserved ejection fraction (HCC) 02/20/2023   Essential hypertension, benign 01/09/2023   Tremor 01/09/2023   Chronic  idiopathic gout involving toe of left foot without tophus 12/06/2022   Allergic rhinitis 04/22/2022   Need for prophylactic vaccination and inoculation against varicella 10/12/2021   Encounter for general adult medical examination with abnormal findings 04/13/2021   Degenerative disc disease, lumbar 03/14/2021   Degenerative disc disease, cervical 09/18/2018   Thiamine  deficiency 04/27/2018   Renal cyst, left 04/24/2018   Alcohol abuse,  continuous 04/24/2018   Lead exposure 04/24/2018   Benign prostatic hyperplasia without lower urinary tract symptoms 04/23/2017   Hypomagnesemia 02/20/2017   Hyperlipidemia with target LDL less than 130 09/09/2014   Hypothyroidism 09/09/2014   Thoracic aortic aneurysm without rupture 03/13/2014   Neuropathy due to chemotherapeutic drug (HCC) 09/08/2013   Insomnia 07/30/2013   CARCINOMA, SQUAMOUS CELL 04/28/2010   GERD 02/11/2007    Past Surgical History:  Procedure Laterality Date   CATARACT EXTRACTION Right    had it a couple of months ago   02/06/2022   CYSTOSCOPY W/ RETROGRADES Bilateral 10/02/2023   Procedure: CYSTOSCOPY, WITH RETROGRADE PYELOGRAM;  Surgeon: Nieves Cough, MD;  Location: [facility];  Service: Urology;  Laterality: Bilateral;   HERNIA REPAIR     inguinal   hip replacemnt left Left    Left eye     LUMBAR LAMINECTOMY     stmach surgery     repair peg tube site   TRANSURETHRAL RESECTION OF BLADDER TUMOR WITH MITOMYCIN -C Bilateral 02/10/2022   Procedure: TRANSURETHRAL RESECTION OF BLADDER TUMOR  AND  POST OP INSTILLATION OF GEMCITABINE ;  Surgeon: Nieves Cough, MD;  Location: [facility];  Service: Urology;  Laterality: Bilateral;  1 HR FOR CASE       Home Medications    Prior to Admission medications   Medication Sig Start Date End Date Taking? Authorizing Provider  sucralfate (CARAFATE) 1 g tablet Take 1 tablet (1 g total) by mouth 4 (four) times daily -  with meals and at bedtime. 02/19/24 03/20/24 Yes Tyliah Schlereth E, PA-C  allopurinol  (ZYLOPRIM ) 300 MG tablet Take 1 tablet (300 mg total) by mouth daily. 12/07/22   Joane Artist RAMAN, MD  aspirin  EC 81 MG tablet Take 1 tablet (81 mg total) by mouth daily. Swallow whole. 06/29/23   Jeffrie Oneil BROCKS, MD  bisoprolol  (ZEBETA ) 5 MG tablet Take 1 tablet (5 mg total) by mouth daily. 02/12/24   [name], MD  candesartan  (ATACAND ) 32 MG tablet Take 1 tablet (32 mg total) by mouth daily. 08/17/23   [name], MD  colchicine  0.6 MG tablet Take 1 tablet (0.6 mg total) by mouth daily as needed (gout or psuedogout pain). 10/20/22   Joane Artist RAMAN, MD  empagliflozin  (JARDIANCE ) 10 MG TABS tablet Take 1 tablet (10 mg total) by mouth daily. 12/26/23   [name], MD  fluticasone  (FLONASE ) 50 MCG/ACT nasal spray SPRAY 2 SPRAYS INTO EACH NOSTRIL EVERY DAY 10/30/23   [name], MD  levothyroxine  (SYNTHROID ) 100 MCG tablet TAKE 1 TABLET BY MOUTH EVERY DAY 12/14/23   [name], MD  MAGnesium -Oxide 400 (240 Mg) MG tablet Take 1 tablet (400 mg total) by mouth daily. 12/26/23   [name], MD  omeprazole  (PRILOSEC) 40 MG capsule TAKE 1 CAPSULE (40 MG TOTAL) BY MOUTH DAILY. 10/10/23   [name], MD  primidone  (MYSOLINE ) 50 MG tablet Take 1 tablet (50 mg total) by mouth 2 (two) times daily. 08/14/23   Tat, Asberry RAMAN, DO  rosuvastatin  (CRESTOR ) 20 MG tablet Take 1  tablet (20 mg total) by mouth daily. 12/26/23   [name], MD  thiamine  (VITAMIN B-1) 50 MG tablet Take 1 tablet (50 mg total) by mouth daily. 07/19/23   [name], MD  zolpidem  (AMBIEN ) 10 MG tablet Take 1 tablet (10 mg total) by mouth at bedtime as needed. for sleep 01/31/24   [name], MD    Family History Family History  Problem Relation Age of Onset   Cancer Mother        lymphoma   Colon cancer Mother    Cancer Father        lung (smoker)   Hyperlipidemia Father    Hypertension Father    Diabetes Father    Esophageal cancer Neg Hx    Rectal cancer Neg Hx    Stomach cancer Neg Hx     Social History Social History   Tobacco Use   Smoking status: Former    Current packs/day: 0.00    Average packs/day: 1.5 packs/day for 35.9 years (53.9 ttl pk-yrs)    Types: Cigarettes, Cigars    Start date: 05/16/1973    Quit date: 04/19/2009    Years since quitting: 14.8   Smokeless tobacco: Never   Tobacco comments:    quit smoking 5 years sago  Vaping Use   Vaping status: Never Used  Substance Use Topics    Alcohol use: Yes    Alcohol/week: 2.0 standard drinks of alcohol    Types: 2 Shots of liquor per week    Comment: 4 drinks a day   Drug use: No     Allergies   Benicar  [olmesartan ] and Amlodipine    Review of Systems Review of Systems  Constitutional:  Negative for chills and fever.  HENT:  Positive for congestion and trouble swallowing. Negative for postnasal drip.   Respiratory:  Positive for cough, choking, shortness of breath and wheezing. Negative for chest tightness.   Cardiovascular:  Negative for chest pain and palpitations.  Gastrointestinal:  Negative for diarrhea, nausea and vomiting.     Physical Exam Triage Vital Signs ED Triage Vitals  Encounter Vitals Group     BP 02/19/24 1048 (!) 182/94     Girls Systolic BP Percentile --      Girls Diastolic BP Percentile --      Boys Systolic BP Percentile --      Boys Diastolic BP Percentile --      Pulse Rate 02/19/24 1048 65     Resp 02/19/24 1048 18     Temp 02/19/24 1048 97.6 F (36.4 C)     Temp Source 02/19/24 1048 Oral     SpO2 02/19/24 1048 98 %     Weight 02/19/24 1051 169 lb (76.7 kg)     Height 02/19/24 1051 5' 10 (1.778 m)     Head Circumference --      Peak Flow --      Pain Score 02/19/24 1050 0     Pain Loc --      Pain Education --      Exclude from Growth Chart --    No data found.  Updated Vital Signs BP (!) 182/94 (BP Location: Right Arm)   Pulse 65   Temp 97.6 F (36.4 C) (Oral)   Resp 18   Ht 5' 10 (1.778 m)   Wt 169 lb (76.7 kg)   SpO2 98%   BMI 24.25 kg/m   Visual Acuity Right Eye Distance:   Left Eye Distance:  Bilateral Distance:    Right Eye Near:   Left Eye Near:    Bilateral Near:     Physical Exam Vitals reviewed.  Constitutional:      General: He is awake.     Appearance: Normal appearance. He is well-developed and well-groomed.  HENT:     Head: Normocephalic and atraumatic.     Mouth/Throat:     Mouth: Mucous membranes are moist.     Pharynx: Uvula  midline. Posterior oropharyngeal erythema and postnasal drip present. No pharyngeal swelling, oropharyngeal exudate or uvula swelling.     Comments: Tonsillar pillars and tongue show evidence of structural changes - apparent absence of tonsils. Some evidence of postnasal drainage in posterior oropharynx Eyes:     Extraocular Movements: Extraocular movements intact.     Conjunctiva/sclera: Conjunctivae normal.  Cardiovascular:     Rate and Rhythm: Normal rate and regular rhythm.     Heart sounds: Normal heart sounds. No murmur heard.    No friction rub. No gallop.  Pulmonary:     Effort: Pulmonary effort is normal.     Breath sounds: Normal breath sounds. No decreased air movement. No decreased breath sounds, wheezing, rhonchi or rales.  Musculoskeletal:     Cervical back: Normal range of motion.  Neurological:     Mental Status: He is alert and oriented to person, place, and time.  Psychiatric:        Attention and Perception: Attention normal.        Mood and Affect: Mood normal.        Speech: Speech normal.        Behavior: Behavior normal. Behavior is cooperative.      UC Treatments / Results  Labs (all labs ordered are listed, but only abnormal results are displayed) Labs Reviewed - No data to display  EKG   Radiology No results found.  Procedures Procedures (including critical care time)  Medications Ordered in UC Medications - No data to display  Initial Impression / Assessment and Plan / UC Course  I have reviewed the triage vital signs and the nursing notes.  Pertinent labs & imaging results that were available during my care of the patient were reviewed by me and considered in my medical decision making (see chart for details).      Final Clinical Impressions(s) / UC Diagnoses   Final diagnoses:  Chronic cough  Globus sensation   Chronic throat clearing and dysphagia with suspected esophageal motility disorder and possible reflux disease Chronic  throat clearing and dysphagia for several months, with recent onset of wheezing. Symptoms include coughing up food, especially when lying down, and a raspy throat. Previous swallow test indicated some stricture and tertiary contractions, but no significant obstruction. Differential includes esophageal motility disorder and reflux disease. Current treatment with omeprazole  has not shown significant improvement. Possible esophageal stricture or structural issue requiring further evaluation by GI specialist. - Continue omeprazole  40 mg at night. - Start sucralfate twice daily to coat the stomach and reduce reflux irritation. - Follow up with GI specialist on November 20th for further evaluation and potential endoscopy or esophageal dilation.  Wheezing and chronic nasal congestion New onset wheezing and chronic nasal congestion. Lungs are clear on examination, and oxygen saturation is good, reducing suspicion for pneumonia. Possible causes include nasal drainage and esophageal issues. Exposure to black mold at work may contribute to nasal irritation. Current use of Mucinex and Flonase  for nasal congestion. Wheezing may be referred from the throat rather than  the lungs. - Continue Mucinex and Flonase  for nasal congestion. - Advised wearing a mask and minimizing exposure to black mold at work. - Continue sleeping on an incline to alleviate symptoms.    Discharge Instructions      VISIT SUMMARY:  You came in today with chronic throat issues and new onset wheezing. You have been experiencing these symptoms for several months, including choking, coughing up food, and difficulty breathing when lying down. Despite undergoing a swallow test and consulting a throat specialist, no cause has been found so far. You have been on omeprazole  40 mg at night for potential acid reflux, but this has not resolved your symptoms. You also mentioned a history of esophageal issues and a work environment with black mold, which  might be contributing to your symptoms.  YOUR PLAN:  -CHRONIC THROAT CLEARING AND DYSPHAGIA WITH SUSPECTED ESOPHAGEAL MOTILITY DISORDER AND POSSIBLE REFLUX DISEASE: Chronic throat clearing and difficulty swallowing (dysphagia) may be due to an esophageal motility disorder or reflux disease. You will continue taking omeprazole  40 mg at night and start taking sucralfate twice daily to coat your stomach and reduce irritation from reflux. You have a follow-up appointment with a GI specialist on November 20th for further evaluation and potential procedures like endoscopy or esophageal dilation.  -WHEEZING AND CHRONIC NASAL CONGESTION: Your new onset wheezing and chronic nasal congestion may be due to nasal drainage or esophageal issues. Your lungs are clear, and your oxygen levels are good, so pneumonia is less likely. You should continue using Mucinex and Flonase  for nasal congestion, wear a mask at work to minimize exposure to black mold, and continue sleeping on an incline to help alleviate your symptoms.  INSTRUCTIONS:  Please follow up with the GI specialist on November 20th for further evaluation and potential procedures like endoscopy or esophageal dilation.     ED Prescriptions     Medication Sig Dispense Auth. Provider   sucralfate (CARAFATE) 1 g tablet Take 1 tablet (1 g total) by mouth 4 (four) times daily -  with meals and at bedtime. 120 tablet Jabez Molner E, PA-C      PDMP not reviewed this encounter.   Marylene Rocky BRAVO, PA-C 02/19/24 1539

## 2024-02-19 NOTE — ED Triage Notes (Signed)
 Pt presents with a chief complaint of wheezing x 2 days. States he is beginning to have difficulty swallowing. Feeling more weak this morning. Spouse reports that when he lies down, he cannot breathe. Hx of throat cancer. Did take OTC Mucinex with improvement in symptoms. Clearing throat in triage. 98% on room air. BP 182/94. Has not taken BP medications today.

## 2024-02-19 NOTE — Discharge Instructions (Signed)
 VISIT SUMMARY:  You came in today with chronic throat issues and new onset wheezing. You have been experiencing these symptoms for several months, including choking, coughing up food, and difficulty breathing when lying down. Despite undergoing a swallow test and consulting a throat specialist, no cause has been found so far. You have been on omeprazole  40 mg at night for potential acid reflux, but this has not resolved your symptoms. You also mentioned a history of esophageal issues and a work environment with black mold, which might be contributing to your symptoms.  YOUR PLAN:  -CHRONIC THROAT CLEARING AND DYSPHAGIA WITH SUSPECTED ESOPHAGEAL MOTILITY DISORDER AND POSSIBLE REFLUX DISEASE: Chronic throat clearing and difficulty swallowing (dysphagia) may be due to an esophageal motility disorder or reflux disease. You will continue taking omeprazole  40 mg at night and start taking sucralfate twice daily to coat your stomach and reduce irritation from reflux. You have a follow-up appointment with a GI specialist on November 20th for further evaluation and potential procedures like endoscopy or esophageal dilation.  -WHEEZING AND CHRONIC NASAL CONGESTION: Your new onset wheezing and chronic nasal congestion may be due to nasal drainage or esophageal issues. Your lungs are clear, and your oxygen levels are good, so pneumonia is less likely. You should continue using Mucinex and Flonase  for nasal congestion, wear a mask at work to minimize exposure to black mold, and continue sleeping on an incline to help alleviate your symptoms.  INSTRUCTIONS:  Please follow up with the GI specialist on November 20th for further evaluation and potential procedures like endoscopy or esophageal dilation.

## 2024-02-19 NOTE — Telephone Encounter (Signed)
 This RN called patient at this time. Name and DOB verified. Pt has made an appointment here at [facility] for 2:30 PM. States he is having difficulty swallowing and is wheezing. Advised pt to come in now for evaluation. Reports he will be here as soon as he can. Pt denies SOB. Feels overall ill but not in any distress per description.

## 2024-02-20 ENCOUNTER — Ambulatory Visit: Payer: Self-pay | Admitting: General Surgery

## 2024-02-20 ENCOUNTER — Other Ambulatory Visit: Payer: Self-pay | Admitting: Internal Medicine

## 2024-02-20 DIAGNOSIS — I10 Essential (primary) hypertension: Secondary | ICD-10-CM

## 2024-02-20 DIAGNOSIS — K4090 Unilateral inguinal hernia, without obstruction or gangrene, not specified as recurrent: Secondary | ICD-10-CM | POA: Diagnosis not present

## 2024-02-22 NOTE — Progress Notes (Signed)
 Assessment/Plan:   1..Tremor             -Would not recommend beta-blocker given hx of brady (not today) and low blood pressure     2.  Neck pain             -seeing neurosx now             -did PT and had dry needling and it was very helpful             -following Dr. Joshua with [facility]   3  Peripheral neuropathy             -Has evidence of this on his examination, although patient states not bothersome.             -Patient has had chemotherapy in the past.  Alcohol likely a contributor as well.    4.  B12 deficiency  - Now on oral supplementation, 1000 mcg daily.  - Will recheck, along with B1 since he stopped taking that one  5.  Dysphagia  - Has appointment with GI later this week  6.  F/u yearly Subjective:   Brandon [name] was seen today in follow up regarding multiple complaints.  My previous records as well as any outside records available were reviewed prior to todays visit.  Patient remains on primidone  50 mg twice per day.  Tremor has responded well with this medication.  Last visit, patient did have significantly low B12 and we told him to initially start injections and then transition to oral B12.  He reports that he is taking the b12 but not the b1.  Separately, he is having trouble with swallowing and has an upcoming appt with GI on 02/28/24. States that ENT didn't have answers.  GERD medication didn't help.  He states that he will cough up food/pills that he has swallowed a few min before.       CURRENT MEDICATIONS:  Outpatient Encounter Medications as of 02/25/2024  Medication Sig   allopurinol  (ZYLOPRIM ) 300 MG tablet Take 1 tablet (300 mg total) by mouth daily.   aspirin  EC 81 MG tablet Take 1 tablet (81 mg total) by mouth daily. Swallow whole.   bisoprolol  (ZEBETA ) 5 MG tablet Take 1 tablet (5 mg total) by mouth daily.   candesartan  (ATACAND ) 32 MG tablet TAKE 1 TABLET BY MOUTH DAILY.   colchicine  0.6 MG tablet Take 1 tablet (0.6 mg total)  by mouth daily as needed (gout or psuedogout pain).   empagliflozin  (JARDIANCE ) 10 MG TABS tablet Take 1 tablet (10 mg total) by mouth daily.   fluticasone  (FLONASE ) 50 MCG/ACT nasal spray SPRAY 2 SPRAYS INTO EACH NOSTRIL EVERY DAY   levothyroxine  (SYNTHROID ) 100 MCG tablet TAKE 1 TABLET BY MOUTH EVERY DAY   MAGnesium -Oxide 400 (240 Mg) MG tablet Take 1 tablet (400 mg total) by mouth daily.   omeprazole  (PRILOSEC) 40 MG capsule TAKE 1 CAPSULE (40 MG TOTAL) BY MOUTH DAILY.   primidone  (MYSOLINE ) 50 MG tablet Take 1 tablet (50 mg total) by mouth 2 (two) times daily.   rosuvastatin  (CRESTOR ) 20 MG tablet Take 1 tablet (20 mg total) by mouth daily.   sucralfate (CARAFATE) 1 g tablet Take 1 tablet (1 g total) by mouth 4 (four) times daily -  with meals and at bedtime.   thiamine  (VITAMIN B-1) 50 MG tablet Take 1 tablet (50 mg total) by mouth daily.   zolpidem  (AMBIEN ) 10 MG tablet Take 1 tablet (  10 mg total) by mouth at bedtime as needed. for sleep   [DISCONTINUED] candesartan  (ATACAND ) 32 MG tablet Take 1 tablet (32 mg total) by mouth daily.   No facility-administered encounter medications on file as of 02/25/2024.     Objective:   PHYSICAL EXAMINATION:    VITALS:   Vitals:   02/25/24 1120  BP: 122/86  Pulse: 74  SpO2: 98%  Weight: 170 lb 3.2 oz (77.2 kg)     GEN:  The patient appears stated age and is in NAD. HEENT:  Normocephalic, atraumatic.  The mucous membranes are moist. The superficial temporal arteries are without ropiness or tenderness. CV:  RRR Lungs:  CTAB Neck/HEME:  There are no carotid bruits bilaterally.  Neurological examination:  Orientation: The patient is alert and oriented x3. Cranial nerves: There is good facial symmetry.The speech is fluent and clear. Soft palate rises symmetrically and there is no tongue deviation. Hearing is intact to conversational tone. Sensation: Sensation is intact to light touch throughout Motor: Strength is at least antigravity  x4.  Movement examination: Tone: There is normal tone in the UE/LE Abnormal movements: No rest tremor.  Very minimal postural or intention tremor.  No myoclonus.  No asterixis.  Archimedes spirals with mild tremor. Coordination:  There is no decremation with RAM's.   Cc:  [name], MD

## 2024-02-25 ENCOUNTER — Ambulatory Visit: Admitting: Neurology

## 2024-02-25 ENCOUNTER — Other Ambulatory Visit

## 2024-02-25 VITALS — BP 122/86 | HR 74 | Wt 170.2 lb

## 2024-02-25 DIAGNOSIS — R1310 Dysphagia, unspecified: Secondary | ICD-10-CM | POA: Diagnosis not present

## 2024-02-25 DIAGNOSIS — R251 Tremor, unspecified: Secondary | ICD-10-CM

## 2024-02-25 DIAGNOSIS — E519 Thiamine deficiency, unspecified: Secondary | ICD-10-CM | POA: Diagnosis not present

## 2024-02-25 DIAGNOSIS — E538 Deficiency of other specified B group vitamins: Secondary | ICD-10-CM

## 2024-02-25 NOTE — Patient Instructions (Signed)
Your provider has requested that you have labwork completed today. The lab is located on the Second floor at [address], within the  Endocrinology office. When you get off the elevator, turn right and go in the  Endocrinology [address]; the first brown door on the left.  Tell the ladies behind the desk that you are there for lab work. If you are not called within 15 minutes please check with the front desk.   Once you complete your labs you are free to go. You will receive a call or message via MyChart with your lab results.    

## 2024-02-28 ENCOUNTER — Ambulatory Visit: Admitting: Physician Assistant

## 2024-02-28 ENCOUNTER — Encounter: Payer: Self-pay | Admitting: Physician Assistant

## 2024-02-28 VITALS — BP 138/78 | HR 63 | Ht 70.0 in | Wt 168.1 lb

## 2024-02-28 DIAGNOSIS — Z8589 Personal history of malignant neoplasm of other organs and systems: Secondary | ICD-10-CM

## 2024-02-28 DIAGNOSIS — R1313 Dysphagia, pharyngeal phase: Secondary | ICD-10-CM

## 2024-02-28 DIAGNOSIS — K2100 Gastro-esophageal reflux disease with esophagitis, without bleeding: Secondary | ICD-10-CM | POA: Diagnosis not present

## 2024-02-28 NOTE — Patient Instructions (Signed)
 _______________________________________________________  If your blood pressure at your visit was 140/90 or greater, please contact your primary care physician to follow up on this.  _______________________________________________________  If you are age 72 or older, your body mass index should be between 23-30. Your Body mass index is 24.12 kg/m. If this is out of the aforementioned range listed, please consider follow up with your Primary Care Provider.  If you are age 59 or younger, your body mass index should be between 19-25. Your Body mass index is 24.12 kg/m. If this is out of the aformentioned range listed, please consider follow up with your Primary Care Provider.   ________________________________________________________  The [facility] providers would like to encourage you to use MYCHART to communicate with providers for non-urgent requests or questions.  Due to long hold times on the telephone, sending your provider a message by [facility] may be a faster and more efficient way to get a response.  Please allow 48 business hours for a response.  Please remember that this is for non-urgent requests.  _______________________________________________________  [facility] is using a team-based approach to care.  Your team is made up of your doctor and two to three APPS. Our APPS (Nurse Practitioners and Physician Assistants) work with your physician to ensure care continuity for you. They are fully qualified to address your health concerns and develop a treatment plan. They communicate directly with your gastroenterologist to care for you. Seeing the Advanced Practice Practitioners on your physician's team can help you by facilitating care more promptly, often allowing for earlier appointments, access to diagnostic testing, procedures, and other specialty referrals.   CONTINUE: omeprazole  40mg  one capsule two times daily   You have been scheduled for an endoscopy. Please follow  written instructions given to you at your visit today.  If you use inhalers (even only as needed), please bring them with you on the day of your procedure.  If you take any of the following medications, they will need to be adjusted prior to your procedure:   DO NOT TAKE 7 DAYS PRIOR TO TEST- Trulicity (dulaglutide) Ozempic, Wegovy (semaglutide) Mounjaro, Zepbound (tirzepatide) Bydureon Bcise (exanatide extended release)  DO NOT TAKE 1 DAY PRIOR TO YOUR TEST Rybelsus (semaglutide) Adlyxin (lixisenatide) Victoza (liraglutide) Byetta (exanatide) ___________________________________________________________________________  You have been scheduled for a CT scan of the neck at [facility], 1st floor Radiology. You are scheduled on 03-03-24 at 4:30pm. You should arrive 30 minutes prior to your appointment time for registration.   You may take any medications as prescribed with a small amount of water, if necessary. If you take any of the following medications: METFORMIN, GLUCOPHAGE, GLUCOVANCE, AVANDAMET, RIOMET, FORTAMET, ACTOPLUS MET, JANUMET, GLUMETZA or METAGLIP, you MAY be asked to HOLD this medication 48 hours AFTER the exam.   The purpose of you drinking the oral contrast is to aid in the visualization of your intestinal tract. The contrast solution may cause some diarrhea. Depending on your individual set of symptoms, you may also receive an intravenous injection of x-ray contrast/dye. Plan on being at [facility] for 45 minutes or longer, depending on the type of exam you are having performed.   If you have any questions regarding your exam or if you need to reschedule, you may call [facility] at [phone] between the hours of 8:00 am and 5:00 pm, Monday-Friday.   Due to recent changes in healthcare laws, you may see the results of your imaging and laboratory studies on MyChart before your  provider has had a chance to review them.  We understand that in some cases  there may be results that are confusing or concerning to you. Not all laboratory results come back in the same time frame and the provider may be waiting for multiple results in order to interpret others.  Please give us  48 hours in order for your provider to thoroughly review all the results before contacting the office for clarification of your results.

## 2024-02-28 NOTE — Progress Notes (Signed)
 Brandon Console, PA-C [address] [place], [place]  [address] Phone: [phone]   Gastroenterology Consultation  Referring Provider:     [name], MD Primary Care Physician:  Brandon Debby CROME, MD Primary Gastroenterologist:  Brandon Console, PA-C / [name]  Reason for Consultation:     Dysphagia        HPI:   Discussed the use of AI scribe software for clinical note transcription with the patient, who gave verbal consent to proceed.  73 year old male is referred to evaluate pharyngeal dysphagia.  He has history of oropharyngeal and neck cancer status post radiation and chemotherapy in 2011.  He had ENT evaluation by Brandon [name] which was unrevealing.  He has been taking omeprazole  40 mg once daily with little benefit.  Omeprazole  was recently increased to twice daily dose.  He states he will cough up food and pills after swallowing.  Currently taking omeprazole  40 mg twice daily and sucralfate 1 g 3 times daily as needed.  11/27/2023 barium swallow with tablet: No mucosal lesions or strictures visualized; mild intermittent esophageal dysmotility noted with tertiary contractions seen throughout distal third of the esophagus. Otherwise normal Esophagram.  No aspiration.  No previous EGD.  10/01/2023 CT abdomen pelvis without contrast: 1. Suboptimal evaluation of left ureterovesical junction due to beam hardening artifact from hip arthroplasty. No focal bladder wall thickening in the visualized portions of the bladder. 2. No evidence of metastatic disease in the abdomen or pelvis. 3. Avascular necrosis of the right femoral head without collapse. 4.  Aortic Atherosclerosis  01/14/2024 CT chest: No acute abnormality.  No lung masses.  No lymphadenopathy. 01/14/2024 coronary CTA: Minimal CAD, less than 25% stenosis.  Coronary calcium  score of 0.  Mild dilation of ascending aorta 4.2 cm.  He is scheduled for inguinal hernia repair by Brandon [name] 05/01/2024.  PMH:  History of neck cancer s/p radiation and chemo.  History of bladder cancer.  Former smoker.  CHF, hypertension, thoracic aortic aneurysm, squamous cell carcinoma, GERD, hypothyroidism, peripheral neuropathy, gout, BPH, alcohol use disorder.  Currently seeing Dr. Evonnie neurologist for tremor, peripheral neuropathy, and B12 deficiency.  Recently started on vitamin B12, 1000 mcg daily.   History of Present Illness He has been experiencing difficulty swallowing for the past eight to nine months, with symptoms worsening over the last month. The sensation is described as needing to spit shortly after eating, with food or liquid regurgitating back up. This occurs even with soft foods like ice cream or bananas, and a specific incident with crab meat was particularly troublesome.  He has a history of throat cancer treated with 35 sessions of radiation approximately 14 years ago, which has led to chronic swallowing issues. Since then, he has adapted by eating softer foods and taking smaller bites. However, the current symptoms are more pronounced than previous issues.  His family history includes his father who died of lung cancer, which contributes to his concern about potential cancer recurrence. He experiences frequent coughing and a sensation of something being stuck in his throat, described as 'tickliness'. He carries a paper towel to manage the frequent need to spit, which he finds socially challenging. No feeling of food getting stuck in the throat, no heartburn or chest pain.  GI HISTORY:  11/2014 last screening colonoscopy by Dr. Starch: 1 small 6 mm hyperplastic sigmoid polyp removed.  Moderate pandiverticulosis.  10-year repeat colonoscopy will be due 11/2024.   Past Medical History:  Diagnosis  Date   Allergy    mild   Aortic aneurysm    small and watching. followed by his PCP Dr. debby Molt, 02/06/2022   Arthritis    GERD (gastroesophageal reflux disease)    Head and neck cancer    head and  neck ca dx 03/2009   Hernia cerebri (HCC)    Hyperlipidemia    Hypertension    Hypothyroidism    oropharyngeal ca dx'd 03/2009   chemo/xrt comp 06/2009   Swallowing difficulty    due to his cancer in neck and throat sometimes has problems swalling pills and had problem intubation due to radion to neck are. 02/06/2022   Thyroid  disease    Wears glasses    reading 02/06/2022    Past Surgical History:  Procedure Laterality Date   CATARACT EXTRACTION Right    had it a couple of months ago   02/06/2022   CYSTOSCOPY W/ RETROGRADES Bilateral 10/02/2023   Procedure: CYSTOSCOPY, WITH RETROGRADE PYELOGRAM;  Surgeon: Nieves Cough, MD;  Location: [facility];  Service: Urology;  Laterality: Bilateral;   HERNIA REPAIR     inguinal   hip replacemnt left Left    Left eye     LUMBAR LAMINECTOMY     stmach surgery     repair peg tube site   TRANSURETHRAL RESECTION OF BLADDER TUMOR WITH MITOMYCIN -C Bilateral 02/10/2022   Procedure: TRANSURETHRAL RESECTION OF BLADDER TUMOR  AND  POST OP INSTILLATION OF GEMCITABINE ;  Surgeon: Nieves Cough, MD;  Location: [facility];  Service: Urology;  Laterality: Bilateral;  1 HR FOR CASE    Prior to Admission medications   Medication Sig Start Date End Date Taking? Authorizing Provider  allopurinol  (ZYLOPRIM ) 300 MG tablet Take 1 tablet (300 mg total) by mouth daily. 12/07/22   Joane Artist RAMAN, MD  aspirin  EC 81 MG tablet Take 1 tablet (81 mg total) by mouth daily. Swallow whole. 06/29/23   Jeffrie Oneil BROCKS, MD  bisoprolol  (ZEBETA ) 5 MG tablet Take 1 tablet (5 mg total) by mouth daily. 02/12/24   Molt Debby CROME, MD  candesartan  (ATACAND ) 32 MG tablet TAKE 1 TABLET BY MOUTH DAILY. 02/22/24   Molt Debby CROME, MD  colchicine  0.6 MG tablet Take 1 tablet (0.6 mg total) by mouth daily as needed (gout or psuedogout pain). 10/20/22   Joane Artist RAMAN, MD  empagliflozin  (JARDIANCE ) 10 MG TABS tablet Take 1 tablet (10 mg total) by mouth daily. 12/26/23   Molt Debby CROME, MD  fluticasone  (FLONASE ) 50 MCG/ACT nasal spray SPRAY 2 SPRAYS INTO EACH NOSTRIL EVERY DAY 10/30/23   Molt Debby CROME, MD  levothyroxine  (SYNTHROID ) 100 MCG tablet TAKE 1 TABLET BY MOUTH EVERY DAY 12/14/23   Molt Debby CROME, MD  MAGnesium -Oxide 400 (240 Mg) MG tablet Take 1 tablet (400 mg total) by mouth daily. 12/26/23   Molt Debby CROME, MD  omeprazole  (PRILOSEC) 40 MG capsule TAKE 1 CAPSULE (40 MG TOTAL) BY MOUTH DAILY. 10/10/23   Molt Debby CROME, MD  primidone  (MYSOLINE ) 50 MG tablet Take 1 tablet (50 mg total) by mouth 2 (two) times daily. 08/14/23   Tat, Asberry RAMAN, DO  rosuvastatin  (CRESTOR ) 20 MG tablet Take 1 tablet (20 mg total) by mouth daily. 12/26/23   Molt Debby CROME, MD  sucralfate (CARAFATE) 1 g tablet Take 1 tablet (1 g total) by mouth 4 (four) times daily -  with meals and at bedtime. 02/19/24 03/20/24  Mecum, Erin E, PA-C  thiamine  (VITAMIN B-1) 50  MG tablet Take 1 tablet (50 mg total) by mouth daily. 07/19/23   Brandon Debby CROME, MD  zolpidem  (AMBIEN ) 10 MG tablet Take 1 tablet (10 mg total) by mouth at bedtime as needed. for sleep 01/31/24   Brandon Debby CROME, MD    Family History  Problem Relation Age of Onset   Cancer Mother        lymphoma   Colon cancer Mother    Cancer Father        lung (smoker)   Hyperlipidemia Father    Hypertension Father    Diabetes Father    Esophageal cancer Neg Hx    Rectal cancer Neg Hx    Stomach cancer Neg Hx      Social History   Tobacco Use   Smoking status: Former    Current packs/day: 0.00    Average packs/day: 1.5 packs/day for 35.9 years (53.9 ttl pk-yrs)    Types: Cigarettes, Cigars    Start date: 05/16/1973    Quit date: 04/19/2009    Years since quitting: 14.8   Smokeless tobacco: Never   Tobacco comments:    quit smoking 5 years sago  Vaping Use   Vaping status: Never Used  Substance Use Topics   Alcohol use: Yes    Alcohol/week: 2.0 standard drinks of alcohol    Types: 2 Shots of liquor per week    Comment: 4  drinks a day   Drug use: No    Allergies as of 02/28/2024 - Review Complete 02/28/2024  Allergen Reaction Noted   Benicar  [olmesartan ]  02/23/2014   Amlodipine  Swelling 12/25/2016    Review of Systems:    All systems reviewed and negative except where noted in HPI.   Physical Exam:  BP 138/78   Pulse 63   Ht 5' 10 (1.778 m)   Wt 168 lb 2 oz (76.3 kg)   BMI 24.12 kg/m  No LMP for male patient.  General:   Alert,  Well-developed, well-nourished, pleasant and cooperative in NAD Mouth: Mild erythema in the posterior oropharynx.  No evidence of candidiasis.  No oral lesions.  Uvula rises symmetrically. Neck: There is moderate scar tissue along bilateral and anterior neck.  No palpable lymphadenopathy or neck masses.  No supraclavicular lymphadenopathy. Lungs:  Respirations even and unlabored.  Clear throughout to auscultation.   No wheezes, crackles, or rhonchi. No acute distress. Heart:  Regular rate and rhythm; no murmurs, clicks, rubs, or gallops. Abdomen:  Normal bowel sounds.  No bruits.  Soft, and non-distended without masses, hepatosplenomegaly or hernias noted.  No Tenderness.  No guarding or rebound tenderness.    Neurologic:  Alert and oriented x3;  grossly normal neurologically. Psych:  Alert and cooperative. Normal mood and affect.   Imaging Studies: No results found.  Labs: CBC    Component Value Date/Time   WBC 6.0 12/26/2023 1340   RBC 4.48 12/26/2023 1340   HGB 13.8 12/26/2023 1340   HGB 14.7 12/30/2014 1157   HGB 15.5 11/13/2012 0856   HCT 41.3 12/26/2023 1340   HCT 41.7 12/30/2014 1157   HCT 44.4 11/13/2012 0856   PLT 331.0 12/26/2023 1340   PLT 263 12/30/2014 1157   PLT 276 11/13/2012 0856   MCV 92.1 12/26/2023 1340   MCV 94 12/30/2014 1157   MCV 90.4 11/13/2012 0856    CMP     Component Value Date/Time   NA 140 12/26/2023 1340   NA 145 12/30/2014 1158   K 4.1 12/26/2023 1340  K 3.5 12/30/2014 1158   CL 106 12/26/2023 1340   CL 99  06/12/2014 0903   CO2 22 12/26/2023 1340   CO2 28 12/30/2014 1158   GLUCOSE 120 (H) 12/26/2023 1340   GLUCOSE 98 12/30/2014 1158   GLUCOSE 95 06/12/2014 0903   BUN 24 (H) 12/26/2023 1340   BUN 23.2 12/30/2014 1158   CREATININE 1.06 12/26/2023 1340   CREATININE 1.24 10/22/2019 1048   CREATININE 0.8 12/30/2014 1158   CALCIUM  9.3 12/26/2023 1340   CALCIUM  9.2 12/30/2014 1158   PROT 6.8 12/26/2023 1340   PROT 6.8 12/30/2014 1158   ALBUMIN 4.2 12/26/2023 1340   ALBUMIN 3.8 12/30/2014 1158   AST 32 12/26/2023 1340   AST 33 12/30/2014 1158   ALT 20 12/26/2023 1340   ALT 22 12/30/2014 1158   ALKPHOS 81 12/26/2023 1340   ALKPHOS 68 12/30/2014 1158   BILITOT 0.3 12/26/2023 1340   BILITOT 0.71 12/30/2014 1158   GFRNONAA >60 09/28/2023 0804   GFRAA  05/04/2010 0437    >60        The eGFR has been calculated using the MDRD equation. This calculation has not been validated in all clinical situations. eGFR's persistently <60 mL/min signify possible Chronic Kidney Disease.    Assessment and Plan:   DAELEN BELVEDERE is a 73 y.o. y/o male has been referred for:  1.  Pharyngeal dysphagia in the setting of prior oropharyngeal cancer treated with radiation.  Recent barium swallow with tablet showed mild esophageal dysmotility, otherwise normal.  No evidence of esophageal stricture, Zenker's, or aspiration.  Recent ENT evaluation by Brandon [name] was unrevealing.  No previous EGD.  Chronic dysphagia with recent exacerbation, regurgitation of food and liquids.  Differential includes EOE and silent acid reflux. Prior radiation and scar tissue likely contributing to symptoms. - Scheduling EGD with possible dilation I discussed risks of EGD with patient to include risk of bleeding, perforation, and risk of sedation.  Patient expressed understanding and agrees to proceed with EGD.   2.  Chronic GERD - Schedule EGD to screen for Barrett's - Continue Omeprazole  40mg  BID. - Continue high-dose omeprazole   for 3 months for potential silent acid reflux.  If no benefit after 3 months, then reduce to once daily PPI dosing long-term. - Advised crushing Carafate pills and dissolving in applesauce for easier ingestion. - Recommend Lifestyle Modifications to prevent Acid Reflux.  Rec. Avoid coffee, sodas, peppermint, garlic, onions, alcohol, citrus fruits, chocolate, tomatoes, fatty and spicey foods.  Avoid eating 2-3 hours before bedtime.    3.  History of oropharyngeal  / neck cancer s/p radiation and chemo in 2011.  Recent chest, abdomen, and pelvic CTs done in the past few months showed no evidence of cancer.  Reassurance.  He has not had a recent neck CT. - Order neck CT with Contrast  Follow up with Dr. Albertus after EGD.  Brandon Console, PA-C

## 2024-02-29 ENCOUNTER — Ambulatory Visit: Payer: Self-pay | Admitting: Neurology

## 2024-02-29 LAB — VITAMIN B1: Vitamin B1 (Thiamine): 9 nmol/L (ref 8–30)

## 2024-02-29 LAB — VITAMIN B12: Vitamin B-12: 1147 pg/mL — ABNORMAL HIGH (ref 200–1100)

## 2024-03-03 ENCOUNTER — Ambulatory Visit (HOSPITAL_COMMUNITY)
Admission: RE | Admit: 2024-03-03 | Discharge: 2024-03-03 | Disposition: A | Discharge status: Home / Self Care | Source: Ambulatory Visit | Attending: Physician Assistant | Admitting: Physician Assistant

## 2024-03-03 DIAGNOSIS — K110 Atrophy of salivary gland: Secondary | ICD-10-CM | POA: Diagnosis not present

## 2024-03-03 DIAGNOSIS — Z8589 Personal history of malignant neoplasm of other organs and systems: Secondary | ICD-10-CM | POA: Diagnosis not present

## 2024-03-03 DIAGNOSIS — M47812 Spondylosis without myelopathy or radiculopathy, cervical region: Secondary | ICD-10-CM | POA: Diagnosis not present

## 2024-03-03 DIAGNOSIS — R590 Localized enlarged lymph nodes: Secondary | ICD-10-CM | POA: Diagnosis not present

## 2024-03-03 MED ORDER — SODIUM CHLORIDE (PF) 0.9 % IJ SOLN
INTRAMUSCULAR | Status: AC
  Filled 2024-03-03: qty 50

## 2024-03-03 MED ORDER — IOHEXOL 300 MG/ML  SOLN
75.0000 mL | Freq: Once | INTRAMUSCULAR | Status: AC | PRN
  Administered 2024-03-03: 75 mL via INTRAVENOUS

## 2024-03-09 ENCOUNTER — Ambulatory Visit: Payer: Self-pay | Admitting: Physician Assistant

## 2024-03-24 ENCOUNTER — Other Ambulatory Visit: Payer: Self-pay | Admitting: Internal Medicine

## 2024-03-24 DIAGNOSIS — E039 Hypothyroidism, unspecified: Secondary | ICD-10-CM

## 2024-04-14 ENCOUNTER — Ambulatory Visit: Admitting: Family Medicine

## 2024-04-14 ENCOUNTER — Encounter: Payer: Self-pay | Admitting: Family Medicine

## 2024-04-14 VITALS — BP 124/76 | HR 61 | Temp 97.8°F | Ht 70.0 in | Wt 168.8 lb

## 2024-04-14 DIAGNOSIS — K219 Gastro-esophageal reflux disease without esophagitis: Secondary | ICD-10-CM

## 2024-04-14 DIAGNOSIS — Z8589 Personal history of malignant neoplasm of other organs and systems: Secondary | ICD-10-CM | POA: Insufficient documentation

## 2024-04-14 DIAGNOSIS — Z8551 Personal history of malignant neoplasm of bladder: Secondary | ICD-10-CM | POA: Insufficient documentation

## 2024-04-14 DIAGNOSIS — E039 Hypothyroidism, unspecified: Secondary | ICD-10-CM | POA: Diagnosis not present

## 2024-04-14 DIAGNOSIS — I10 Essential (primary) hypertension: Secondary | ICD-10-CM

## 2024-04-14 DIAGNOSIS — E785 Hyperlipidemia, unspecified: Secondary | ICD-10-CM

## 2024-04-14 DIAGNOSIS — I5032 Chronic diastolic (congestive) heart failure: Secondary | ICD-10-CM | POA: Diagnosis not present

## 2024-04-14 MED ORDER — ROSUVASTATIN CALCIUM 20 MG PO TABS: 20.0000 mg | ORAL_TABLET | Freq: Every day | ORAL | 3 refills | Status: AC

## 2024-04-14 MED ORDER — OMEPRAZOLE 40 MG PO CPDR: 40.0000 mg | DELAYED_RELEASE_CAPSULE | Freq: Every day | ORAL | 3 refills | Status: AC

## 2024-04-14 MED ORDER — BISOPROLOL FUMARATE 5 MG PO TABS: 5.0000 mg | ORAL_TABLET | Freq: Every day | ORAL | 3 refills | Status: AC

## 2024-04-14 MED ORDER — LEVOTHYROXINE SODIUM 100 MCG PO TABS: 100.0000 ug | ORAL_TABLET | Freq: Every day | ORAL | 3 refills | Status: AC

## 2024-04-14 NOTE — Assessment & Plan Note (Signed)
TSH is at goal. Continue levothyroxine 100 mcg daily.

## 2024-04-14 NOTE — Assessment & Plan Note (Addendum)
 Dysphagia may relate to lack of saliva or chronic GERD. Barium swallow was normal. EGD planned for tomorrow. Continue omeprazole  40 mg daily

## 2024-04-14 NOTE — Assessment & Plan Note (Signed)
 Blood pressure is in good control. Continue bisoprolol  5 mg daily and candesartan  32 mg daily.

## 2024-04-14 NOTE — Progress Notes (Signed)
 " [name] [address] [place] [address] Dept: [phone] Dept Fax: [phone]  Transfer of Care Office Visit  Subjective:    Patient ID: Brandon [name], male    DOB: [birth year], 74 y.o..   MRN: [id number]  Chief Complaint  Patient presents with   Establish Care    Brandon [name]- establish care.  No concerns.  Not fasting today.     History of Present Illness:  Patient is in today to establish care. Brandon [name] was born in [place], [place]. His father did work for [employer] related to missiles. As such, he lived all over the [place] in his childhood. He did finish high school in [place], [place]. He attended [employer] over a number of years taking art classes, but did not complete a degree. 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 He has been married for 42 years. He has two children: son (36) and a daughter (32). He has two grandchildren. He does not smoke (quit cigars ~ 14 years ago). He drinks 4 drinks of bourbon a day. He denies drug use.  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 He underwent chemotherapy and radiation for treatment. He currently has issues with dry mouth and dysphagia. He is scheduled for an EGD tomorrow. He is taking omeprazole    Brandon [name] has a history of bladder cancer. He underwent cystoscopy with excision of cancer and infusion of a chemotherapeutic. By history, this is in remission.  Brandon [name] has a history of hypertension. He is managed on bisoprolol  5 mg daily and candesartan  32 mg daily.  [name] had an echocardiogram in Oct. 2024 that showed diastolic dysfunction c/w HFpEF. He is managed on bisoprolol  5 mg daily and candesartan  32 mg daily. He had been prescribed empagliflozin  this fall, but notes he is not  taking this.  Brandon [name] has a history of gout. He is manage don allopurinol . He notes he takes this a few days a week. he was givne a prescription for colchicine , but has had no further gout attacks.  Brandon [name] has a history of hypothyroidism. He is managed on levothyroxine  100 mcg daily.  Brandon [name] has a history of a tremor. He is being followed by Dr. Evonnie. She is treating him with primidone  50 mg bid. He feels this has helped quite a bit. He is also prescribed both a B1 and B12 supplement.  Brandon [name] has a history of hyperlipidemia. He is managed on rosuvastatin  20 mg bid.  Brandon [name] has a history of an elevated lead level. 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  Past Medical History: Patient Active Problem List   Diagnosis Date Noted   Pill dysphagia 12/26/2023   Chronic gout without tophus 11/08/2023   Elevated LFTs 07/19/2023   (HFpEF) heart failure with preserved ejection fraction (HCC) 02/20/2023   Essential hypertension 01/09/2023   Tremor 01/09/2023   Chronic idiopathic gout involving toe of left foot without tophus 12/06/2022   Allergic rhinitis 04/22/2022   Degenerative disc disease, lumbar 03/14/2021   Degenerative disc disease, cervical 09/18/2018   Thiamine  deficiency 04/27/2018   Renal cyst, left 04/24/2018   Alcohol abuse, continuous  04/24/2018   Lead exposure 04/24/2018   Benign prostatic hyperplasia without lower urinary tract symptoms 04/23/2017   Hypomagnesemia 02/20/2017   Hyperlipidemia with target LDL less than 130 09/09/2014   Hypothyroidism 09/09/2014   Thoracic aortic aneurysm without rupture 03/13/2014   Neuropathy due to chemotherapeutic drug (HCC) 09/08/2013   Insomnia 07/30/2013   CARCINOMA, SQUAMOUS CELL 04/28/2010   GERD 02/11/2007   Past Surgical History:  Procedure Laterality Date   CATARACT EXTRACTION Right    had it a couple of months ago    02/06/2022   CYSTOSCOPY W/ RETROGRADES Bilateral 10/02/2023   Procedure: CYSTOSCOPY, WITH RETROGRADE PYELOGRAM;  Surgeon: Nieves Cough, MD;  Location: [facility];  Service: Urology;  Laterality: Bilateral;   EYE SURGERY     INGUINAL HERNIA REPAIR Left    inguinal   LUMBAR LAMINECTOMY     SPINE SURGERY     stmach surgery     repair peg tube site   TOTAL HIP ARTHROPLASTY Left    TRANSURETHRAL RESECTION OF BLADDER TUMOR WITH MITOMYCIN -C Bilateral 02/10/2022   Procedure: TRANSURETHRAL RESECTION OF BLADDER TUMOR  AND  POST OP INSTILLATION OF GEMCITABINE ;  Surgeon: Nieves Cough, MD;  Location: [name];  Service: Urology;  Laterality: Bilateral;  1 HR FOR CASE   Family History  Problem Relation Age of Onset   Cancer Mother        lymphoma   Colon cancer Mother    Cancer Father        lung (smoker)   Hyperlipidemia Father    Hypertension Father    Diabetes Father    Cancer Sister    Esophageal cancer Neg Hx    Rectal cancer Neg Hx    Stomach cancer Neg Hx    Outpatient Medications Prior to Visit  Medication Sig Dispense Refill   allopurinol  (ZYLOPRIM ) 300 MG tablet Take 1 tablet (300 mg total) by mouth daily. 90 tablet 3   aspirin  EC 81 MG tablet Take 1 tablet (81 mg total) by mouth daily. Swallow whole.     candesartan  (ATACAND ) 32 MG tablet TAKE 1 TABLET BY MOUTH DAILY. 90 tablet 1   colchicine  0.6 MG tablet Take 1 tablet (0.6 mg total) by mouth daily as needed (gout or psuedogout pain). 30 tablet 2   fluticasone  (FLONASE ) 50 MCG/ACT nasal spray SPRAY 2 SPRAYS INTO EACH NOSTRIL EVERY DAY 48 mL 1   MAGnesium -Oxide 400 (240 Mg) MG tablet Take 1 tablet (400 mg total) by mouth daily. 90 tablet 1   primidone  (MYSOLINE ) 50 MG tablet Take 1 tablet (50 mg total) by mouth 2 (two) times daily. 180 tablet 2   sucralfate  (CARAFATE ) 1 g tablet Take 1 tablet (1 g total) by mouth 4 (four) times daily -  with meals and at bedtime. 120 tablet 0   thiamine  (VITAMIN B-1) 50 MG  tablet Take 1 tablet (50 mg total) by mouth daily. 90 tablet 1   zolpidem  (AMBIEN ) 10 MG tablet Take 1 tablet (10 mg total) by mouth at bedtime as needed. for sleep 90 tablet 0   bisoprolol  (ZEBETA ) 5 MG tablet Take 1 tablet (5 mg total) by mouth daily. 90 tablet 0   levothyroxine  (SYNTHROID ) 100 MCG tablet TAKE 1 TABLET BY MOUTH EVERY DAY 30 tablet 0   omeprazole  (PRILOSEC) 40 MG capsule TAKE 1 CAPSULE (40 MG TOTAL) BY MOUTH DAILY. 90 capsule 0   rosuvastatin  (CRESTOR ) 20 MG tablet Take 1 tablet (20 mg total) by mouth daily. 90 tablet  0   empagliflozin  (JARDIANCE ) 10 MG TABS tablet Take 1 tablet (10 mg total) by mouth daily. (Patient not taking: Reported on 04/14/2024) 90 tablet 0   No facility-administered medications prior to visit.   Allergies[1]    Objective:   Today's Vitals   04/14/24 0857  BP: 124/76  Pulse: 61  Temp: 97.8 F (36.6 C)  TempSrc: Temporal  SpO2: 97%  Weight: 168 lb 12.8 oz (76.6 kg)  Height: 5' 10 (1.778 m)   Body mass index is 24.22 kg/m.   General: Well developed, well nourished. No acute distress. Psych: Alert and oriented. Normal mood and affect.  Health Maintenance Due  Topic Date Due   Diabetic kidney evaluation - Urine ACR  Never done   Medicare Annual Wellness (AWV)  07/24/2020    Lab Results    Latest Ref Rng & Units 12/26/2023    1:40 PM 09/28/2023    8:04 AM 07/19/2023   10:06 AM  CBC  WBC 4.0 - 10.5 K/uL 6.0  10.0  6.1   Hemoglobin 13.0 - 17.0 g/dL 86.1  86.7  86.1   Hematocrit 39.0 - 52.0 % 41.3  40.6  42.7   Platelets 150.0 - 400.0 K/uL 331.0  337  299.0       Latest Ref Rng & Units 12/26/2023    1:40 PM 09/28/2023    8:04 AM 07/19/2023   10:06 AM  CMP  Glucose 70 - 99 mg/dL 879  97  897   BUN 6 - 23 mg/dL 24  35  22   Creatinine 0.40 - 1.50 mg/dL 8.93  9.07  8.79   Sodium 135 - 145 mEq/L 140  138  141   Potassium 3.5 - 5.1 mEq/L 4.1  3.7  3.9   Chloride 96 - 112 mEq/L 106  105  104   CO2 19 - 32 mEq/L 22  23  26    Calcium   8.4 - 10.5 mg/dL 9.3  9.2  9.0   Total Protein 6.0 - 8.3 g/dL 6.8   6.6   Total Bilirubin 0.2 - 1.2 mg/dL 0.3   0.4   Alkaline Phos 39 - 117 U/L 81   91   AST 0 - 37 U/L 32   30   ALT 0 - 53 U/L 20   18    Last lipids Lab Results  Component Value Date   CHOL 151 07/19/2023   HDL 88.10 07/19/2023   LDLCALC 47 07/19/2023   LDLDIRECT 147.7 12/03/2007   TRIG 78.0 07/19/2023   CHOLHDL 2 07/19/2023   Last hemoglobin A1c Lab Results  Component Value Date   HGBA1C 6.5 12/26/2023   Last thyroid  functions Lab Results  Component Value Date   TSH 1.18 12/26/2023   FREET4 1.08 05/22/2011   Last vitamin B12 and Folate Lab Results  Component Value Date   VITAMINB12 1,147 (H) 02/25/2024   FOLATE 9.2 02/13/2023   Component Ref Range & Units (hover) 3 mo ago (12/26/23) 1 yr ago (01/09/23) 2 yr ago (10/12/21) 3 yr ago (04/13/21) 4 yr ago (10/22/19) 5 yr ago (01/06/19)  Lead 14.6 High  12.8 High  CM 11.7 High  CM 11.8 High  CM 15 High  R, CM 20 High  R, CM     Imaging: Esophagus X-ray with double contrast media (11/27/2023) IMPRESSION: No mucosal lesions or strictures visualized; mild intermittent esophageal dysmotility noted with tertiary contractions seen throughout distal third of the esophagus. Otherwise normal esophagram.  CT Coronary morphology  and CTA (01/14/2024) IMPRESSION: 1. Minimal CAD, <25% stenosis, CADRADS 1. 2. Coronary calcium  score of 0. 3. Normal coronary origins with right dominance. 4.  Mild dilation of ascending aorta, 42 mm mid ascending aorta  CT of Soft Tissues of Neck wo contrast (03/03/2024) IMPRESSION: 1. No abnormal mass or adenopathy in the neck. 2. Mildly enlarged anterior mediastinal lymph nodes are unchanged from June 26, 2023 3. Severe cervical spondylosis 4. Fatty atrophy of the left submandibular gland and left parotid gland  Assessment & Plan:   Problem List Items Addressed This Visit       Cardiovascular and Mediastinum   (HFpEF) heart  failure with preserved ejection fraction (HCC) - Primary   Reviewed prior echocardiogram report. Brandon [name] does have mild HFpEF. Continue bisoprolol  5 mg daily and candesartan  32 mg daily.      Relevant Medications   bisoprolol  (ZEBETA ) 5 MG tablet   rosuvastatin  (CRESTOR ) 20 MG tablet   Essential hypertension   Blood pressure is in good control. Continue bisoprolol  5 mg daily and candesartan  32 mg daily.      Relevant Medications   bisoprolol  (ZEBETA ) 5 MG tablet   rosuvastatin  (CRESTOR ) 20 MG tablet     Digestive   GERD   Dysphagia may relate to lack of saliva or chronic GERD. Barium swallow was normal. EGD planned for tomorrow. Continue omeprazole  40 mg daily      Relevant Medications   omeprazole  (PRILOSEC) 40 MG capsule     Endocrine   Hypothyroidism   TSH is at goal. Continue levothyroxine  100 mcg daily.      Relevant Medications   bisoprolol  (ZEBETA ) 5 MG tablet   levothyroxine  (SYNTHROID ) 100 MCG tablet     Other   Hyperlipidemia with target LDL less than 130   LDL cholesterol is at goal. Continue rosuvastatin  20 mg daily.      Relevant Medications   bisoprolol  (ZEBETA ) 5 MG tablet   rosuvastatin  (CRESTOR ) 20 MG tablet    Return in about 3 months (around 07/13/2024) for Reassessment.   Garnette CHRISTELLA Simpler, MD    [1]  Allergies Allergen Reactions   Benicar  [Olmesartan ]     fatigue   Amlodipine  Swelling   "

## 2024-04-14 NOTE — Assessment & Plan Note (Signed)
 Reviewed prior echocardiogram report. Mr. Brandon [name] does have mild HFpEF. Continue bisoprolol  5 mg daily and candesartan  32 mg daily.

## 2024-04-14 NOTE — Assessment & Plan Note (Signed)
 LDL cholesterol is at goal. Continue rosuvastatin  20 mg daily.

## 2024-04-15 ENCOUNTER — Encounter: Payer: Self-pay | Admitting: Internal Medicine

## 2024-04-15 ENCOUNTER — Ambulatory Visit: Admitting: Internal Medicine

## 2024-04-15 VITALS — BP 116/74 | HR 54 | Temp 97.3°F | Resp 18 | Ht 70.0 in | Wt 168.2 lb

## 2024-04-15 DIAGNOSIS — R1310 Dysphagia, unspecified: Secondary | ICD-10-CM | POA: Diagnosis not present

## 2024-04-15 DIAGNOSIS — R1313 Dysphagia, pharyngeal phase: Secondary | ICD-10-CM

## 2024-04-15 DIAGNOSIS — Z9889 Other specified postprocedural states: Secondary | ICD-10-CM | POA: Diagnosis not present

## 2024-04-15 DIAGNOSIS — B3781 Candidal esophagitis: Secondary | ICD-10-CM | POA: Diagnosis present

## 2024-04-15 MED ORDER — SODIUM CHLORIDE 0.9 % IV SOLN: 500.0000 mL | INTRAVENOUS | Status: DC

## 2024-04-15 MED ORDER — FLUCONAZOLE 10 MG/ML PO SUSR: ORAL | 0 refills | Status: AC

## 2024-04-15 NOTE — Progress Notes (Signed)
 To pacu, VSS. Report to [email or web address]

## 2024-04-15 NOTE — Patient Instructions (Addendum)
 New Medication has been sent to your preferred pharmacy.   YOU HAD AN ENDOSCOPIC PROCEDURE TODAY AT THE [facility]:   Refer to the procedure report that was given to you for any specific questions about what was found during the examination.  If the procedure report does not answer your questions, please call your gastroenterologist to clarify.  If you requested that your care partner not be given the details of your procedure findings, then the procedure report has been included in a sealed envelope for you to review at your convenience later.  YOU SHOULD EXPECT: Some feelings of bloating in the abdomen. Passage of more gas than usual.  Walking can help get rid of the air that was put into your GI tract during the procedure and reduce the bloating. If you had a lower endoscopy (such as a colonoscopy or flexible sigmoidoscopy) you may notice spotting of blood in your stool or on the toilet paper. If you underwent a bowel prep for your procedure, you may not have a normal bowel movement for a few days.  Please Note:  You might notice some irritation and congestion in your nose or some drainage.  This is from the oxygen used during your procedure.  There is no need for concern and it should clear up in a day or so.  SYMPTOMS TO REPORT IMMEDIATELY:  Following upper endoscopy (EGD)  Vomiting of blood or coffee ground material  New chest pain or pain under the shoulder blades  Painful or persistently difficult swallowing  New shortness of breath  Fever of 100F or higher  Black, tarry-looking stools  For urgent or emergent issues, a gastroenterologist can be reached at any hour by calling [phone]. Do not use MyChart messaging for urgent concerns.    DIET:  We do recommend a small meal at first, but then you may proceed to your regular diet.  Drink plenty of fluids but you should avoid alcoholic beverages for 24 hours.  ACTIVITY:  You should plan to take it easy for the rest of  today and you should NOT DRIVE or use heavy machinery until tomorrow (because of the sedation medicines used during the test).    FOLLOW UP: Our staff will call the number listed on your records the next business day following your procedure.  We will call around 7:15- 8:00 am to check on you and address any questions or concerns that you may have regarding the information given to you following your procedure. If we do not reach you, we will leave a message.     If any biopsies were taken you will be contacted by phone or by letter within the next 1-3 weeks.  Please call us  at [phone] if you have not heard about the biopsies in 3 weeks.    SIGNATURES/CONFIDENTIALITY: You and/or your care partner have signed paperwork which will be entered into your electronic medical record.  These signatures attest to the fact that that the information above on your After Visit Summary has been reviewed and is understood.  Full responsibility of the confidentiality of this discharge information lies with you and/or your care-partner.

## 2024-04-15 NOTE — Op Note (Signed)
 [facility] Patient Name: Brandon [name] Procedure Date: 04/15/2024 9:47 AM MRN: [id number] Endoscopist: Gordy CHRISTELLA Starch , MD, [id number] Age: 74 Referring MD:  Date of Birth: 1950-09-14 Gender: Male Account #: [id number] Procedure:                Upper GI endoscopy Indications:              Dysphagia; History of head neck cancer status post                            radiation therapy, barium esophagram showing                            dysmotility without mucosal abnormality or stricture Medicines:                Monitored Anesthesia Care Procedure:                Pre-Anesthesia Assessment:                           - Prior to the procedure, a History and Physical                            was performed, and patient medications and                            allergies were reviewed. The patient's tolerance of                            previous anesthesia was also reviewed. The risks                            and benefits of the procedure and the sedation                            options and risks were discussed with the patient.                            All questions were answered, and informed consent                            was obtained. Prior Anticoagulants: The patient has                            taken no anticoagulant or antiplatelet agents. ASA                            Grade Assessment: III - A patient with severe                            systemic disease. After reviewing the risks and                            benefits, the patient was deemed in satisfactory  condition to undergo the procedure.                           After obtaining informed consent, the endoscope was                            passed under direct vision. Throughout the                            procedure, the patient's blood pressure, pulse, and                            oxygen saturations were monitored continuously. The                            GIF  [id number] #[id number] was introduced through the                            mouth, and advanced to the second part of duodenum.                            The upper GI endoscopy was accomplished without                            difficulty. The patient tolerated the procedure                            well. Scope In: Scope Out: Findings:                 Patchy, Salaam plaques were found in the upper third                            of the esophagus and in the middle third of the                            esophagus.                           No endoscopic abnormality was evident in the                            esophagus to explain the patient's complaint of                            dysphagia. It was decided, however, to proceed with                            dilation of the entire esophagus. The scope was                            withdrawn. Dilation was performed with a Maloney                            dilator with moderate  resistance at 52 Fr.                           A medium scar from prior gastrostomy tube was found                            in the gastric body. The scar was unremarkable in                            appearance.                           The entire examined stomach was otherwise normal.                           The examined duodenum was normal. Complications:            No immediate complications. Estimated Blood Loss:     Estimated blood loss: none. Impression:               - Esophageal plaques were found, consistent with                            candidiasis.                           - No endoscopic esophageal abnormality to explain                            patient's dysphagia. Esophagus dilated. Dilated.                           - Normal stomach.                           - Normal examined duodenum.                           - No specimens collected. Recommendation:           - Patient has a contact number available for                             emergencies. The signs and symptoms of potential                            delayed complications were discussed with the                            patient. Return to normal activities tomorrow.                            Written discharge instructions were provided to the                            patient.                           -  Resume previous diet.                           - Continue present medications.                           - Fluconazole  (liquid if pills unable to be                            swallowed easily) 400 mg x 1 day, 200 mg x 13 days                            for Candida (yeast) esophagitis. Gordy CHRISTELLA Starch, MD 04/15/2024 10:21:02 AM This report has been signed electronically.

## 2024-04-15 NOTE — Progress Notes (Signed)
 "   GASTROENTEROLOGY PROCEDURE H&P NOTE   Primary Care Physician: Thedora Garnette HERO, MD    Reason for Procedure:  Dysphagia, history of prior oropharyngeal cancer treated with XRT  Plan:    EGD with possible dilation  Patient is appropriate for endoscopic procedure(s) in the ambulatory ([facility]) setting.  The nature of the procedure, as well as the risks, benefits, and alternatives were carefully and thoroughly reviewed with the patient. Ample time for discussion and questions allowed.  All questions were answered. The patient understood, was satisfied, and agreed with the plan to proceed.    HPI: Brandon [name] is a 74 y.o. male who presents for upper endoscopy.  Medical history as below.  No recent chest pain or shortness of breath.  No abdominal pain today.  Past Medical History:  Diagnosis Date   Allergy    mild   Aortic aneurysm    small and watching. followed by his PCP Dr. debby Molt, 02/06/2022   Arthritis    Cataract    GERD (gastroesophageal reflux disease)    Head and neck cancer    head and neck ca dx 03/2009   Hernia cerebri (HCC)    Hyperlipidemia    Hypertension    Hypothyroidism    Neuromuscular disorder (HCC)    oropharyngeal ca dx'd 03/2009   chemo/xrt comp 06/2009   Swallowing difficulty    due to his cancer in neck and throat sometimes has problems swalling pills and had problem intubation due to radion to neck are. 02/06/2022   Thyroid  disease    Wears glasses    reading 02/06/2022    Past Surgical History:  Procedure Laterality Date   CATARACT EXTRACTION Right    had it a couple of months ago   02/06/2022   CYSTOSCOPY W/ RETROGRADES Bilateral 10/02/2023   Procedure: CYSTOSCOPY, WITH RETROGRADE PYELOGRAM;  Surgeon: Nieves Cough, MD;  Location: [facility];  Service: Urology;  Laterality: Bilateral;   EYE SURGERY     INGUINAL HERNIA REPAIR Left    inguinal   LUMBAR LAMINECTOMY     SPINE SURGERY     stmach surgery     repair peg tube site    TOTAL HIP ARTHROPLASTY Left    TRANSURETHRAL RESECTION OF BLADDER TUMOR WITH MITOMYCIN -C Bilateral 02/10/2022   Procedure: TRANSURETHRAL RESECTION OF BLADDER TUMOR  AND  POST OP INSTILLATION OF GEMCITABINE ;  Surgeon: Nieves Cough, MD;  Location: [facility];  Service: Urology;  Laterality: Bilateral;  1 HR FOR CASE    Prior to Admission medications  Medication Sig Start Date End Date Taking? Authorizing Provider  allopurinol  (ZYLOPRIM ) 300 MG tablet Take 1 tablet (300 mg total) by mouth daily. 12/07/22  Yes Joane Artist RAMAN, MD  bisoprolol  (ZEBETA ) 5 MG tablet Take 1 tablet (5 mg total) by mouth daily. 04/14/24  Yes Thedora Garnette HERO, MD  candesartan  (ATACAND ) 32 MG tablet TAKE 1 TABLET BY MOUTH DAILY. 02/22/24  Yes Molt Debby CROME, MD  fluticasone  (FLONASE ) 50 MCG/ACT nasal spray SPRAY 2 SPRAYS INTO EACH NOSTRIL EVERY DAY 10/30/23  Yes Molt Debby CROME, MD  levothyroxine  (SYNTHROID ) 100 MCG tablet Take 1 tablet (100 mcg total) by mouth daily. 04/14/24  Yes Thedora Garnette HERO, MD  MAGnesium -Oxide 400 (240 Mg) MG tablet Take 1 tablet (400 mg total) by mouth daily. 12/26/23  Yes Molt Debby CROME, MD  omeprazole  (PRILOSEC) 40 MG capsule Take 1 capsule (40 mg total) by mouth daily. 04/14/24  Yes Thedora Garnette HERO, MD  primidone  (MYSOLINE ) 50 MG tablet Take 1 tablet (50 mg total) by mouth 2 (two) times daily. 08/14/23  Yes Tat, Asberry RAMAN, DO  rosuvastatin  (CRESTOR ) 20 MG tablet Take 1 tablet (20 mg total) by mouth daily. 04/14/24  Yes Thedora Garnette HERO, MD  thiamine  (VITAMIN B-1) 50 MG tablet Take 1 tablet (50 mg total) by mouth daily. 07/19/23  Yes [name], MD  zolpidem  (AMBIEN ) 10 MG tablet Take 1 tablet (10 mg total) by mouth at bedtime as needed. for sleep 01/31/24  Yes [name], MD  aspirin  EC 81 MG tablet Take 1 tablet (81 mg total) by mouth daily. Swallow whole. 06/29/23   Jeffrie Oneil BROCKS, MD  colchicine  0.6 MG tablet Take 1 tablet (0.6 mg total) by mouth daily as needed (gout or psuedogout  pain). 10/20/22   Corey, Evan S, MD  sucralfate  (CARAFATE ) 1 g tablet Take 1 tablet (1 g total) by mouth 4 (four) times daily -  with meals and at bedtime. 02/19/24 04/14/24  Mecum, Erin E, PA-C    Current Outpatient Medications  Medication Sig Dispense Refill   allopurinol  (ZYLOPRIM ) 300 MG tablet Take 1 tablet (300 mg total) by mouth daily. 90 tablet 3   bisoprolol  (ZEBETA ) 5 MG tablet Take 1 tablet (5 mg total) by mouth daily. 90 tablet 3   candesartan  (ATACAND ) 32 MG tablet TAKE 1 TABLET BY MOUTH DAILY. 90 tablet 1   fluticasone  (FLONASE ) 50 MCG/ACT nasal spray SPRAY 2 SPRAYS INTO EACH NOSTRIL EVERY DAY 48 mL 1   levothyroxine  (SYNTHROID ) 100 MCG tablet Take 1 tablet (100 mcg total) by mouth daily. 90 tablet 3   MAGnesium -Oxide 400 (240 Mg) MG tablet Take 1 tablet (400 mg total) by mouth daily. 90 tablet 1   omeprazole  (PRILOSEC) 40 MG capsule Take 1 capsule (40 mg total) by mouth daily. 90 capsule 3   primidone  (MYSOLINE ) 50 MG tablet Take 1 tablet (50 mg total) by mouth 2 (two) times daily. 180 tablet 2   rosuvastatin  (CRESTOR ) 20 MG tablet Take 1 tablet (20 mg total) by mouth daily. 90 tablet 3   thiamine  (VITAMIN B-1) 50 MG tablet Take 1 tablet (50 mg total) by mouth daily. 90 tablet 1   zolpidem  (AMBIEN ) 10 MG tablet Take 1 tablet (10 mg total) by mouth at bedtime as needed. for sleep 90 tablet 0   aspirin  EC 81 MG tablet Take 1 tablet (81 mg total) by mouth daily. Swallow whole.     colchicine  0.6 MG tablet Take 1 tablet (0.6 mg total) by mouth daily as needed (gout or psuedogout pain). 30 tablet 2   sucralfate  (CARAFATE ) 1 g tablet Take 1 tablet (1 g total) by mouth 4 (four) times daily -  with meals and at bedtime. 120 tablet 0   Current Facility-Administered Medications  Medication Dose Route Frequency Provider Last Rate Last Admin   0.9 %  sodium chloride  infusion  500 mL Intravenous Continuous Karina Nofsinger, Gordy HERO, MD        Allergies as of 04/15/2024 - Review Complete 04/15/2024   Allergen Reaction Noted   Benicar  [olmesartan ] Other (See Comments) 02/23/2014   Amlodipine  Swelling 12/25/2016    Family History  Problem Relation Age of Onset   Cancer Mother        lymphoma   Colon cancer Mother    Cancer Father        lung (smoker)   Hyperlipidemia Father    Hypertension Father    Diabetes  Father    Cancer Sister    Esophageal cancer Neg Hx    Rectal cancer Neg Hx    Stomach cancer Neg Hx     Social History   Socioeconomic History   Marital status: Married    Spouse name: Not on file   Number of children: 2   Years of education: 15   Highest education level: Some college, no degree  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 History   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  Tobacco Use   Smoking status: Former    Current packs/day: 0.00    Average packs/day: 1.5 packs/day for 35.9 years (53.9 ttl pk-yrs)    Types: Cigarettes, Cigars    Start date: 05/16/1973    Quit date: 04/19/2009    Years since quitting: 15.0   Smokeless tobacco: Never  Vaping Use   Vaping status: Never Used  Substance and Sexual Activity   Alcohol use: Yes    Alcohol/week: 2.0 standard drinks of alcohol    Types: 2 Shots of liquor per week    Comment: 4 drinks a day   Drug use: No   Sexual activity: Yes    Partners: Female  Other Topics Concern   Not on file  Social History Narrative   HSG, [employer]-no degree. married - '76 - 5 years, divorced; remarried  '84. 1 son - '85; 1 daughter '89. 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    Right handed    Social Drivers of Health   Tobacco Use: Medium Risk (04/15/2024)   Patient History    Smoking Tobacco Use: Former    Smokeless Tobacco Use: Never    Passive Exposure: Not on file  Financial Resource Strain: Low Risk (04/13/2024)   Overall Financial Resource Strain (CARDIA)    Difficulty of Paying Living Expenses: Not hard at all  Food Insecurity: No Food Insecurity (04/13/2024)   Epic    Worried About Brewing Technologist in the Last Year: Never true    Ran Out of Food in the Last Year: Never true  Transportation Needs: No Transportation Needs (04/13/2024)   Epic    Lack of Transportation (Medical): No    Lack of Transportation (Non-Medical): No  Physical Activity: Insufficiently Active (04/13/2024)   Exercise Vital Sign    Days of Exercise per Week: 4 days    Minutes of Exercise per Session: 20 min  Stress: No Stress Concern Present (04/13/2024)   [employer] of 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 Health - 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 Stress Questionnaire    Feeling of Stress: Not at all  Social Connections: Moderately Isolated (04/13/2024)   Social Connection and Isolation Panel    Frequency of Communication with Friends and Family: Three times a week    Frequency of Social Gatherings with Friends and Family: Once a week    Attends Religious Services: Never    Database Administrator or Organizations: No    Attends Engineer, Structural: Not on file    Marital Status: Married  Catering Manager Violence: Not on file  Depression (PHQ2-9): Low Risk (04/14/2024)   Depression (PHQ2-9)    PHQ-2 Score: 0  Alcohol Screen: Low Risk (12/26/2023)   Alcohol Screen    Last Alcohol Screening Score (AUDIT): 6  Housing: Low Risk (04/13/2024)   Epic    Unable to Pay for Housing in the Last Year: No    Number of Times Moved in the Last Year: 0    Homeless in the Last Year: No  Utilities:  Not on file  Health Literacy: Not on file    Physical Exam: Vital signs in last 24 hours: @BP  110/73   Pulse 62   Temp (!) 97.3 F (36.3 C) (Temporal)   Ht 5' 10 (1.778 m)   Wt 168 lb 3.2 oz (76.3 kg)   SpO2 99%   BMI 24.13 kg/m  GEN: NAD EYE: Sclerae anicteric ENT: MMM CV: Non-tachycardic Pulm: CTA b/l GI: Soft, NT/ND NEURO:  Alert & Oriented x 3   Gordy Starch, MD [facility]  04/15/2024 9:54 AM  "

## 2024-04-16 ENCOUNTER — Telehealth: Payer: Self-pay

## 2024-04-16 NOTE — Telephone Encounter (Signed)
 Attempted f/u call. No answer, left VM.

## 2024-04-25 ENCOUNTER — Encounter (HOSPITAL_COMMUNITY): Payer: Self-pay

## 2024-04-25 NOTE — Pre-Procedure Instructions (Signed)
 Surgical Instructions   Your procedure is scheduled on May 01, 2024. Report to [facility] Main Entrance A at 7:00 A.M., then check in with the Admitting office. Any questions or running late day of surgery: call [phone]  Questions prior to your surgery date: call [phone], Monday-Friday, 8am-4pm. If you experience any cold or flu symptoms such as cough, fever, chills, shortness of breath, etc. between now and your scheduled surgery, please notify us  at the above number.     Remember:  Do not eat after midnight the night before your surgery   You may drink clear liquids until 6:00 AM the morning of your surgery.   Clear liquids allowed are: Water, Non-Citrus Juices (without pulp), Carbonated Beverages, Clear Tea (no milk, honey, etc.), Black Coffee Only (NO MILK, CREAM OR POWDERED CREAMER of any kind), and Gatorade.  Patient Instructions  The night before surgery:  No food after midnight. ONLY clear liquids after midnight  The day of surgery (if you do NOT have diabetes):  Drink ONE (1) Pre-Surgery Clear Ensure by 6:00 AM the morning of surgery. Drink in one sitting. Do not sip.  This drink was given to you during your hospital  pre-op appointment visit.  Nothing else to drink after completing the  Pre-Surgery Clear Ensure.         If you have questions, please contact your surgeons office.   Take these medicines the morning of surgery with A SIP OF WATER: allopurinol  (ZYLOPRIM )  bisoprolol  (ZEBETA )  fluconazole  (DIFLUCAN )  levothyroxine  (SYNTHROID )  omeprazole  (PRILOSEC)  primidone  (MYSOLINE )    May take these medicines IF NEEDED: colchicine     One week prior to surgery, STOP taking any Aspirin  (unless otherwise instructed by your surgeon) Aleve, Naproxen, Ibuprofen, Motrin, Advil, Goody's, BC's, all herbal medications, fish oil, and non-prescription vitamins.                     Do NOT Smoke (Tobacco/Vaping) for 24 hours prior to your  procedure.  If you use a CPAP at night, you may bring your mask/headgear for your overnight stay.   You will be asked to remove any contacts, glasses, piercing's, hearing aid's, dentures/partials prior to surgery. Please bring cases for these items if needed.    Your surgeon will determine if you are to be admitted or discharged the same day.  Patients discharged the day of surgery will not be allowed to drive home, and someone needs to stay with them for 24 hours.  SURGICAL WAITING ROOM VISITATION Patients may have no more than 2 support people in the waiting area - these visitors may rotate.   Pre-op nurse will coordinate an appropriate time for 2 ADULT support persons, who may not rotate, to accompany patient in pre-op.  Children under the age of 1 must have an adult with them who is not the patient and must remain in the main waiting area with an adult.  If the patient needs to stay at the hospital during part of their recovery, the visitor guidelines for inpatient rooms apply.  Please refer to the [facility] website for the visitor guidelines for any additional information.   If you received a COVID test during your pre-op visit  it is requested that you wear a mask when out in public, stay away from anyone that may not be feeling well and notify your surgeon if you develop symptoms. If you have been in contact with anyone that has tested positive in the last 10  days please notify you surgeon.      Pre-operative CHG Bathing Instructions   You can play a key role in reducing the risk of infection after surgery. Your skin needs to be as free of germs as possible. You can reduce the number of germs on your skin by washing with CHG (chlorhexidine  gluconate) soap before surgery. CHG is an antiseptic soap that kills germs and continues to kill germs even after washing.   DO NOT use if you have an allergy to chlorhexidine /CHG or antibacterial soaps. If your skin becomes reddened or  irritated, stop using the CHG and notify one of our RNs at [phone].              TAKE A SHOWER THE NIGHT BEFORE SURGERY   Please keep in mind the following:  DO NOT shave, including legs and underarms, 48 hours prior to surgery.   You may shave your face before/day of surgery.  Place clean sheets on your bed the night before surgery Use a clean washcloth (not used since being washed) for shower. DO NOT sleep with pet's night before surgery.  CHG Shower Instructions:  Wash your face and private area with normal soap. If you choose to wash your hair, wash first with your normal shampoo.  After you use shampoo/soap, rinse your hair and body thoroughly to remove shampoo/soap residue.  Turn the water OFF and apply half the bottle of CHG soap to a CLEAN washcloth.  Apply CHG soap ONLY FROM YOUR NECK DOWN TO YOUR TOES (washing for 3-5 minutes)  DO NOT use CHG soap on face, private areas, open wounds, or sores.  Pay special attention to the area where your surgery is being performed.  If you are having back surgery, having someone wash your back for you may be helpful. Wait 2 minutes after CHG soap is applied, then you may rinse off the CHG soap.  Pat dry with a clean towel  Put on clean pajamas    Additional instructions for the day of surgery: If you choose, you may shower the morning of surgery with an antibacterial soap.  DO NOT APPLY any lotions, deodorants, cologne, or perfumes.   Do not wear jewelry or makeup Do not wear nail polish, gel polish, artificial nails, or any other type of covering on natural nails (fingers and toes) Do not bring valuables to the hospital. [facility] is not responsible for valuables/personal belongings. Put on clean/comfortable clothes.  Please brush your teeth.  Ask your nurse before applying any prescription medications to the skin.

## 2024-04-28 ENCOUNTER — Other Ambulatory Visit: Payer: Self-pay

## 2024-04-28 ENCOUNTER — Encounter (HOSPITAL_COMMUNITY): Payer: Self-pay

## 2024-04-28 ENCOUNTER — Encounter (HOSPITAL_COMMUNITY)
Admission: RE | Admit: 2024-04-28 | Discharge: 2024-04-28 | Disposition: A | Source: Ambulatory Visit | Attending: General Surgery

## 2024-04-28 VITALS — BP 122/72 | HR 59 | Temp 97.6°F | Resp 18 | Ht 70.0 in | Wt 166.9 lb

## 2024-04-28 DIAGNOSIS — I2510 Atherosclerotic heart disease of native coronary artery without angina pectoris: Secondary | ICD-10-CM | POA: Insufficient documentation

## 2024-04-28 DIAGNOSIS — I77810 Thoracic aortic ectasia: Secondary | ICD-10-CM | POA: Insufficient documentation

## 2024-04-28 DIAGNOSIS — I440 Atrioventricular block, first degree: Secondary | ICD-10-CM | POA: Insufficient documentation

## 2024-04-28 DIAGNOSIS — Z01812 Encounter for preprocedural laboratory examination: Secondary | ICD-10-CM | POA: Insufficient documentation

## 2024-04-28 DIAGNOSIS — Z01818 Encounter for other preprocedural examination: Secondary | ICD-10-CM

## 2024-04-28 HISTORY — DX: Family history of other specified conditions: Z84.89

## 2024-04-28 HISTORY — DX: Chronic kidney disease, unspecified: N18.9

## 2024-04-28 HISTORY — DX: Heart failure, unspecified: I50.9

## 2024-04-28 HISTORY — DX: Failed or difficult intubation, initial encounter: T88.4XXA

## 2024-04-28 LAB — COMPREHENSIVE METABOLIC PANEL WITH GFR
ALT: 30 U/L (ref 0–44)
AST: 45 U/L — ABNORMAL HIGH (ref 15–41)
Albumin: 4.2 g/dL (ref 3.5–5.0)
Alkaline Phosphatase: 108 U/L (ref 38–126)
Anion gap: 12 (ref 5–15)
BUN: 21 mg/dL (ref 8–23)
CO2: 25 mmol/L (ref 22–32)
Calcium: 9.5 mg/dL (ref 8.9–10.3)
Chloride: 103 mmol/L (ref 98–111)
Creatinine, Ser: 0.99 mg/dL (ref 0.61–1.24)
GFR, Estimated: >60 mL/min
Glucose, Bld: 108 mg/dL — ABNORMAL HIGH (ref 70–99)
Potassium: 4.5 mmol/L (ref 3.5–5.1)
Sodium: 141 mmol/L (ref 135–145)
Total Bilirubin: 0.4 mg/dL (ref 0.0–1.2)
Total Protein: 6.9 g/dL (ref 6.5–8.1)

## 2024-04-28 LAB — CBC
HCT: 40.5 % (ref 39.0–52.0)
Hemoglobin: 13.3 g/dL (ref 13.0–17.0)
MCH: 29 pg (ref 26.0–34.0)
MCHC: 32.8 g/dL (ref 30.0–36.0)
MCV: 88.4 fL (ref 80.0–100.0)
Platelets: 339 K/uL (ref 150–400)
RBC: 4.58 MIL/uL (ref 4.22–5.81)
RDW: 15.2 % (ref 11.5–15.5)
WBC: 8.4 K/uL (ref 4.0–10.5)
nRBC: 0 % (ref 0.0–0.2)

## 2024-04-28 NOTE — Progress Notes (Addendum)
 PCP - Dr. Garnette Simpler Cardiologist - Dr. Oneil Parchment (last visit 05/28/2023, f/u PRN)  PPM/ICD - denies  Chest x-ray - n/a EKG - 04/28/24 Stress Test - denies ECHO - 02/19/23 Cardiac Cath - denies Coronary CT - 01/14/24  Sleep Study - denies CPAP - n/a  No DM  Last dose of GLP1 agonist-  N/A   Blood Thinner Instructions: denies Aspirin  Instructions: denies  ERAS Protcol - Clear liquids until 06:00 + Ensure  COVID TEST- N/A   Anesthesia review: yes, Lynwood Hope PA-C to PAT to assess patient, hx of difficult intubation s/p head/neck cancer with radiation. Reports stenosis and limited ROM to neck, limited jaw opening. Records requested from surgical center.  Patient denies shortness of breath, fever, cough and chest pain at PAT appointment. Pt denies respiratory symptoms for two weeks prior to PAT appointment.    All instructions explained to the patient, with a verbal understanding of the material. Patient agrees to go over the instructions while at home for a better understanding. The opportunity to ask questions was provided.

## 2024-04-30 NOTE — Anesthesia Preprocedure Evaluation (Signed)
 "                                  Anesthesia Evaluation  Patient identified by MRN, date of birth, ID band Patient awake    Reviewed: Allergy & Precautions, NPO status , Patient's Chart, lab work & pertinent test results  History of Anesthesia Complications (+) DIFFICULT AIRWAY and history of anesthetic complications  Airway Mallampati: IV  TM Distance: >3 FB Neck ROM: Limited  Mouth opening: Limited Mouth Opening  Dental  (+) Teeth Intact, Dental Advisory Given   Pulmonary neg shortness of breath, neg sleep apnea, neg COPD, neg recent URI, former smoker   breath sounds clear to auscultation       Cardiovascular hypertension, Pt. on medications (-) angina + Peripheral Vascular Disease  (-) Past MI and (-) CHF (-) dysrhythmias  Rhythm:Regular  1. Left ventricular ejection fraction, by estimation, is 55 to 60%. The  left ventricle has normal function. The left ventricle has no regional  wall motion abnormalities. Left ventricular diastolic parameters are  consistent with Grade II diastolic  dysfunction (pseudonormalization).   2. Right ventricular systolic function is normal. The right ventricular  size is mildly enlarged. There is normal pulmonary artery systolic  pressure.   3. Left atrial size was mild to moderately dilated.   4. The mitral valve is normal in structure. Trivial mitral valve  regurgitation. No evidence of mitral stenosis.   5. The aortic valve is tricuspid. Aortic valve regurgitation is not  visualized. No aortic stenosis is present.   6. Aortic dilatation noted. There is moderate dilatation of the ascending  aorta, measuring 45 mm.   7. The inferior vena cava is normal in size with greater than 50%  respiratory variability, suggesting right atrial pressure of 3 mmHg.     Neuro/Psych neg Seizures  Neuromuscular disease    GI/Hepatic Neg liver ROS,GERD  Medicated,,  Endo/Other  neg diabetesHypothyroidism    Renal/GU negative Renal ROSLab  Results      Component                Value               Date                      NA                       141                 04/28/2024                K                        4.5                 04/28/2024                CO2                      25                  04/28/2024                GLUCOSE                  108 (H)  04/28/2024                BUN                      21                  04/28/2024                CREATININE               0.99                04/28/2024                CALCIUM                   9.5                 04/28/2024                GFR                      70.51               12/26/2023                EGFR                     >90                 12/30/2014                GFRNONAA                 >60                 04/28/2024                Musculoskeletal  (+) Arthritis ,    Abdominal   Peds  Hematology negative hematology ROS (+)   Anesthesia Other Findings   Reproductive/Obstetrics                              Anesthesia Physical Anesthesia Plan  ASA: 2  Anesthesia Plan: General   Post-op Pain Management: Toradol  IV (intra-op)*   Induction: Intravenous  PONV Risk Score and Plan: 3 and Ondansetron  and Dexamethasone   Airway Management Planned: Oral ETT and Video Laryngoscope Planned  Additional Equipment: None  Intra-op Plan:   Post-operative Plan: Extubation in OR  Informed Consent: I have reviewed the patients History and Physical, chart, labs and discussed the procedure including the risks, benefits and alternatives for the proposed anesthesia with the patient or authorized representative who has indicated his/her understanding and acceptance.     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   Coronary CTA 01/14/2024 ordered by PCP showed calcium  score of 0, minimal CAD, mild dilation at the mid ascending aorta 42 mm.  Carotid duplex 06/2023 showed 1 to 39% stenosis bilaterally.  Echo 02/2023 showed LVEF 55 to 60%, grade 2 DD, normal RV systolic function, no significant valve abnormalities, moderate dilatation of ascending aorta 45  mm.  History of difficult intubation secondary to reduced neck range of motion and limited oral opening.  Patient had L3-4 microdiscectomy at the [facility] specialty surgery center on 05/03/2022.  Anesthesia records requested and reviewed.  Per records, video laryngoscope was used electively.  Note states, mask ventilation easy without airway. GSVL 4 with grade 2 view.  Atraumatic intubation except for small lip laceration.  Dentition with no changes.  C-spine neutral.  Small mouth opening.  Copy of records scanned in media.  Preop labs reviewed, unremarkable.  EKG 04/28/2024: Sinus bradycardia with first-degree AV block.  Rate 57.  Coronary CTA 01/14/2024: IMPRESSION: 1. Minimal CAD, <25% stenosis, CADRADS 1.  2. Coronary calcium  score of 0.  3. Normal coronary origins with right dominance.  4.  Mild dilation of ascending aorta, 42 mm mid ascending aorta  RECOMMENDATIONS: CAD-RADS 1. Minimal non-obstructive CAD (0-24%). Consider non-atherosclerotic causes of chest pain. Consider preventive therapy and risk factor modification.  TTE 02/19/2023: 1. Left ventricular ejection fraction, by estimation, is 55 to 60%. The  left ventricle has normal function. The left ventricle has no regional  wall motion abnormalities. Left ventricular diastolic parameters are  consistent with Grade II diastolic  dysfunction (pseudonormalization).  2. Right ventricular systolic function is normal. The right  ventricular  size is mildly enlarged. There is normal pulmonary artery systolic  pressure.  3. Left atrial size was mild to moderately dilated.  4. The mitral valve is normal in structure. Trivial mitral valve  regurgitation. No evidence of mitral stenosis.  5. The aortic valve is tricuspid. Aortic valve regurgitation is not  visualized. No aortic stenosis is present.  6. Aortic dilatation noted. There is moderate dilatation of the ascending  aorta, measuring 45 mm.  7. The inferior vena cava is normal in size with greater than 50%  respiratory variability, suggesting right atrial pressure of 3 mmHg.     )         Anesthesia Quick Evaluation  "

## 2024-04-30 NOTE — Progress Notes (Signed)
 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   Coronary CTA 01/14/2024 ordered by PCP showed calcium  score of 0, minimal CAD, mild dilation at the mid ascending aorta 42 mm.  Carotid duplex 06/2023 showed 1 to 39% stenosis bilaterally.  Echo 02/2023 showed LVEF 55 to 60%, grade 2 DD, normal RV systolic function, no significant valve abnormalities, moderate dilatation of ascending aorta 45 mm.  History of difficult intubation secondary to reduced neck range of motion and limited oral opening.  Patient had L3-4 microdiscectomy at the [facility] surgery center on 05/03/2022.  Anesthesia records requested and reviewed.  Per records, video laryngoscope was used electively.  Note states, mask ventilation easy without airway. GSVL 4 with grade 2 view.  Atraumatic intubation except for small lip laceration.  Dentition with no changes.  C-spine neutral.  Small mouth opening.  Copy of records scanned in media.  Preop labs reviewed, unremarkable.  EKG 04/28/2024: Sinus bradycardia with first-degree AV block.  Rate 57.  Coronary CTA 01/14/2024: IMPRESSION: 1. Minimal CAD, <25% stenosis, CADRADS 1.   2. Coronary calcium  score of 0.   3. Normal coronary origins with right dominance.   4.  Mild dilation of ascending aorta, 42 mm mid ascending aorta   RECOMMENDATIONS: CAD-RADS 1. Minimal non-obstructive CAD (0-24%). Consider non-atherosclerotic causes of chest pain. Consider preventive therapy and risk factor modification.  TTE 02/19/2023: 1. Left ventricular ejection fraction, by estimation, is 55 to 60%. The  left ventricle has normal function. The left ventricle has no regional  wall motion abnormalities. Left ventricular  diastolic parameters are  consistent with Grade II diastolic  dysfunction (pseudonormalization).   2. Right ventricular systolic function is normal. The right ventricular  size is mildly enlarged. There is normal pulmonary artery systolic  pressure.   3. Left atrial size was mild to moderately dilated.   4. The mitral valve is normal in structure. Trivial mitral valve  regurgitation. No evidence of mitral stenosis.   5. The aortic valve is tricuspid. Aortic valve regurgitation is not  visualized. No aortic stenosis is present.   6. Aortic dilatation noted. There is moderate dilatation of the ascending  aorta, measuring 45 mm.   7. The inferior vena cava is normal in size with greater than 50%  respiratory variability, suggesting right atrial pressure of 3 mmHg.     Lynwood Geofm RIGGERS [facility] Short Stay Center/Anesthesiology Phone [phone] 04/30/2024 9:33 AM

## 2024-04-30 NOTE — H&P (Signed)
 Chief Complaint: New Consultation       History of Present Illness: Brandon [name] is a 74 y.o. male who is seen today as an office consultation at the request of Dr. Aurea for evaluation of New Consultation .   History of Present Illness Brandon [name] is a 74 year old male who presents with concerns about a new hernia. He was referred by another doctor for evaluation of a hernia.   Abdominal wall hernia symptoms - New hernia with increased pain - Hernia turned black one week ago - Pain worsened after lifting an object yesterday - History of prior hernia repair with mesh placement, uncertain if on the same side   Relevant medical history - Hypertension - Aortic aneurysm - Not taking anticoagulant medications       Review of Systems: A complete review of systems was obtained from the patient.  I have reviewed this information and discussed as appropriate with the patient.  See HPI as well for other ROS.   Review of Systems  Constitutional:  Negative for fever.  HENT:  Negative for congestion.   Eyes:  Negative for blurred vision.  Respiratory:  Negative for cough, shortness of breath and wheezing.   Cardiovascular:  Negative for chest pain and palpitations.  Gastrointestinal:  Negative for heartburn.  Genitourinary:  Negative for dysuria.  Musculoskeletal:  Negative for myalgias.  Skin:  Negative for rash.  Neurological:  Negative for dizziness and headaches.  Psychiatric/Behavioral:  Negative for depression and suicidal ideas.   All other systems reviewed and are negative.       Medical History:     Past Medical History:  Diagnosis Date   Aneurysm ()     History of cancer        There is no problem list on file for this patient.          Past Surgical History:  Procedure Laterality Date   Back surgery          No Known Allergies         Current Outpatient Medications on File Prior to Visit  Medication Sig Dispense Refill   allopurinoL  (ZYLOPRIM ) 300 MG  tablet Take 300 mg by mouth once daily       aspirin  81 MG EC tablet Take 81 mg by mouth once daily       bisoprolol  (ZEBETA ) 5 MG tablet Take 5 mg by mouth once daily       candesartan  (ATACAND ) 32 MG tablet Take 32 mg by mouth       colchicine  (COLCRYS ) 0.6 mg tablet Take 0.6 mg by mouth       fluticasone  propionate (FLONASE ) 50 mcg/actuation nasal spray Place 2 sprays into both nostrils once daily       levothyroxine  (SYNTHROID ) 100 MCG tablet Take 100 mcg by mouth once daily       magnesium  oxide 420 mg Tab Take 1 tablet by mouth 2 (two) times daily       omeprazole  (PRILOSEC) 40 MG DR capsule Take 40 mg by mouth once daily       primidone  (MYSOLINE ) 50 MG tablet Take 50 mg by mouth 2 (two) times daily       rosuvastatin  (CRESTOR ) 20 MG tablet Take 20 mg by mouth once daily       thiamine  (VITAMIN B-1) 50 MG tablet Take 50 mg by mouth once daily       zolpidem  (AMBIEN ) 10 mg tablet Take 10 mg by mouth  at bedtime as needed for Sleep        No current facility-administered medications on file prior to visit.           Family History  Problem Relation Age of Onset   Colon cancer Mother     High blood pressure (Hypertension) Father     Hyperlipidemia (Elevated cholesterol) Father     Diabetes Father        Social History        Tobacco Use  Smoking Status Every Day   Types: Cigarettes  Smokeless Tobacco Never      Social History         Socioeconomic History   Marital status: Married  Tobacco Use   Smoking status: Every Day      Types: Cigarettes   Smokeless tobacco: Never  Substance and Sexual Activity   Alcohol use: Yes      Alcohol/week: 4.0 - 14.0 standard drinks of alcohol      Types: 4 - 14 Standard drinks or equivalent per week   Drug use: Never    Social Drivers of Acupuncturist Strain: Low Risk  (12/26/2023)    Received from [facility]    Overall Financial Resource Strain (CARDIA)     How hard is it for you to pay for the very basics  like food, housing, medical care, and heating?: Not hard at all  Food Insecurity: No Food Insecurity (12/26/2023)    Received from [facility]    Hunger Vital Sign     Within the past 12 months, you worried that your food would run out before you got the money to buy more.: Never true     Within the past 12 months, the food you bought just didn't last and you didn't have money to get more.: Never true  Transportation Needs: No Transportation Needs (12/26/2023)    Received from [facility] - Transportation     In the past 12 months, has lack of transportation kept you from medical appointments or from getting medications?: No     In the past 12 months, has lack of transportation kept you from meetings, work, or from getting things needed for daily living?: No  Physical Activity: Sufficiently Active (12/26/2023)    Received from [facility]    Exercise Vital Sign     On average, how many days per week do you engage in moderate to strenuous exercise (like a brisk walk)?: 5 days     On average, how many minutes do you engage in exercise at this level?: 30 min  Stress: No Stress Concern Present (12/26/2023)    Received from 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 - 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 Stress Questionnaire     Do you feel stress - tense, restless, nervous, or anxious, or unable to sleep at night because your mind is troubled all the time - these days?: Not at all  Social Connections: Moderately Isolated (12/26/2023)    Received from [facility]    Social Connection and Isolation Panel     In a typical week, how many times do you talk on the phone with family, friends, or neighbors?: More than three times a week     How often do you get together with friends or relatives?: Once a week     How often do you attend church or religious services?: Never     Do  you belong to any clubs or organizations such as church groups, unions, fraternal or athletic groups, or school groups?: No      Are you married, widowed, divorced, separated, never married, or living with a partner?: Married  Housing Stability: Unknown (02/20/2024)    Housing Stability Vital Sign     Homeless in the Last Year: No      Objective:       Body mass index is 24.13 kg/m. Physical Exam Constitutional:      Appearance: Normal appearance.  HENT:     Head: Normocephalic and atraumatic.     Nose: Nose normal. No congestion.     Mouth/Throat:     Mouth: Mucous membranes are moist.     Pharynx: Oropharynx is clear.  Eyes:     Pupils: Pupils are equal, round, and reactive to light.  Cardiovascular:     Rate and Rhythm: Normal rate and regular rhythm.     Pulses: Normal pulses.     Heart sounds: Normal heart sounds. No murmur heard.    No friction rub. No gallop.  Pulmonary:     Effort: Pulmonary effort is normal. No respiratory distress.     Breath sounds: Normal breath sounds. No stridor. No wheezing, rhonchi or rales.  Abdominal:     General: Abdomen is flat.     Hernia: A hernia is present. Hernia is present in the right inguinal area. There is no hernia in the left inguinal area.  Musculoskeletal:        General: Normal range of motion.     Cervical back: Normal range of motion.  Skin:    General: Skin is warm and dry.  Neurological:     General: No focal deficit present.     Mental Status: He is alert and oriented to person, place, and time.  Psychiatric:        Mood and Affect: Mood normal.        Thought Content: Thought content normal.          Assessment and Plan:  Diagnoses and all orders for this visit:   Unilateral inguinal hernia without obstruction or gangrene, recurrence not specified     Brandon [name] is a 74 y.o. male     We will proceed to the OR for a LAP RIGHT inguinal hernia repair with mesh. All risks and benefits were discussed with the patient, to generally include infection, bleeding, damage to surrounding structures, acute and chronic nerve pain, and  recurrence. Alternatives were offered and described.  All questions were answered and the patient voiced understanding of the procedure and wishes to proceed at this point.             No follow-ups on file.   Lynda Leos, MD, [facility], [place] [facility]imally Invasive Surgery

## 2024-04-30 NOTE — Progress Notes (Signed)
 Patient made aware of new surgery time at 0730 and asked to arrive by 0530. Also instructed to drink clear ensure drink by 0430 and to stop all clear liquids after. Patient and wife voiced understanding.

## 2024-05-01 ENCOUNTER — Ambulatory Visit (HOSPITAL_BASED_OUTPATIENT_CLINIC_OR_DEPARTMENT_OTHER): Payer: Self-pay | Admitting: Anesthesiology

## 2024-05-01 ENCOUNTER — Ambulatory Visit (HOSPITAL_COMMUNITY)
Admission: RE | Admit: 2024-05-01 | Discharge: 2024-05-01 | Disposition: A | Discharge status: Home / Self Care | Attending: General Surgery | Admitting: General Surgery

## 2024-05-01 ENCOUNTER — Other Ambulatory Visit: Payer: Self-pay

## 2024-05-01 ENCOUNTER — Encounter (HOSPITAL_COMMUNITY): Payer: Self-pay | Admitting: General Surgery

## 2024-05-01 ENCOUNTER — Encounter (HOSPITAL_COMMUNITY)
Admission: RE | Disposition: A | Discharge status: Home / Self Care | Payer: Self-pay | Source: Home / Self Care | Attending: General Surgery

## 2024-05-01 ENCOUNTER — Encounter (HOSPITAL_COMMUNITY): Payer: Self-pay | Admitting: Physician Assistant

## 2024-05-01 DIAGNOSIS — Z87891 Personal history of nicotine dependence: Secondary | ICD-10-CM | POA: Insufficient documentation

## 2024-05-01 DIAGNOSIS — I10 Essential (primary) hypertension: Secondary | ICD-10-CM | POA: Diagnosis not present

## 2024-05-01 DIAGNOSIS — Z79899 Other long term (current) drug therapy: Secondary | ICD-10-CM | POA: Insufficient documentation

## 2024-05-01 DIAGNOSIS — K4090 Unilateral inguinal hernia, without obstruction or gangrene, not specified as recurrent: Secondary | ICD-10-CM | POA: Diagnosis present

## 2024-05-01 DIAGNOSIS — I739 Peripheral vascular disease, unspecified: Secondary | ICD-10-CM | POA: Insufficient documentation

## 2024-05-01 DIAGNOSIS — K219 Gastro-esophageal reflux disease without esophagitis: Secondary | ICD-10-CM | POA: Insufficient documentation

## 2024-05-01 DIAGNOSIS — E039 Hypothyroidism, unspecified: Secondary | ICD-10-CM | POA: Insufficient documentation

## 2024-05-01 DIAGNOSIS — I719 Aortic aneurysm of unspecified site, without rupture: Secondary | ICD-10-CM | POA: Insufficient documentation

## 2024-05-01 DIAGNOSIS — D176 Benign lipomatous neoplasm of spermatic cord: Secondary | ICD-10-CM | POA: Insufficient documentation

## 2024-05-01 MED ORDER — PHENYLEPHRINE HCL (PRESSORS) 10 MG/ML IV SOLN
INTRAVENOUS | Status: AC
  Filled 2024-05-01: qty 1

## 2024-05-01 MED ORDER — ENSURE PRE-SURGERY PO LIQD: 296.0000 mL | Freq: Once | ORAL | Status: DC

## 2024-05-01 MED ORDER — CEFAZOLIN SODIUM-DEXTROSE 2-4 GM/100ML-% IV SOLN
INTRAVENOUS | Status: AC
  Filled 2024-05-01: qty 100

## 2024-05-01 MED ORDER — BUPIVACAINE HCL 0.25 % IJ SOLN
INTRAMUSCULAR | Status: DC | PRN
  Administered 2024-05-01: 6 mL

## 2024-05-01 MED ORDER — MIDAZOLAM HCL 2 MG/2ML IJ SOLN
INTRAMUSCULAR | Status: AC
  Filled 2024-05-01: qty 2

## 2024-05-01 MED ORDER — FENTANYL CITRATE (PF) 100 MCG/2ML IJ SOLN
25.0000 ug | INTRAMUSCULAR | Status: DC | PRN
  Administered 2024-05-01 (×2): 50 ug via INTRAVENOUS

## 2024-05-01 MED ORDER — DEXAMETHASONE SOD PHOSPHATE PF 10 MG/ML IJ SOLN
INTRAMUSCULAR | Status: DC | PRN
  Administered 2024-05-01: 8 mg via INTRAVENOUS

## 2024-05-01 MED ORDER — ORAL CARE MOUTH RINSE: 15.0000 mL | Freq: Once | OROMUCOSAL | Status: AC

## 2024-05-01 MED ORDER — PROPOFOL 10 MG/ML IV BOLUS
INTRAVENOUS | Status: DC | PRN
  Administered 2024-05-01: 20 mg via INTRAVENOUS
  Administered 2024-05-01: 130 mg via INTRAVENOUS

## 2024-05-01 MED ORDER — MIDAZOLAM HCL (PF) 2 MG/2ML IJ SOLN
INTRAMUSCULAR | Status: DC | PRN
  Administered 2024-05-01: 2 mg via INTRAVENOUS

## 2024-05-01 MED ORDER — CHLORHEXIDINE GLUCONATE 0.12 % MT SOLN
OROMUCOSAL | Status: AC
  Administered 2024-05-01: 15 mL via OROMUCOSAL
  Filled 2024-05-01: qty 15

## 2024-05-01 MED ORDER — ACETAMINOPHEN 500 MG PO TABS: 1000.0000 mg | ORAL_TABLET | ORAL | Status: DC

## 2024-05-01 MED ORDER — FENTANYL CITRATE (PF) 100 MCG/2ML IJ SOLN
INTRAMUSCULAR | Status: AC
  Filled 2024-05-01: qty 2

## 2024-05-01 MED ORDER — CHLORHEXIDINE GLUCONATE CLOTH 2 % EX PADS: 6.0000 | MEDICATED_PAD | Freq: Once | CUTANEOUS | Status: DC

## 2024-05-01 MED ORDER — ONDANSETRON HCL 4 MG/2ML IJ SOLN
INTRAMUSCULAR | Status: AC
  Filled 2024-05-01: qty 2

## 2024-05-01 MED ORDER — TRAMADOL HCL 50 MG PO TABS: 50.0000 mg | ORAL_TABLET | Freq: Four times a day (QID) | ORAL | 0 refills | Status: AC | PRN

## 2024-05-01 MED ORDER — PROPOFOL 10 MG/ML IV BOLUS
INTRAVENOUS | Status: AC
  Filled 2024-05-01: qty 20

## 2024-05-01 MED ORDER — ROCURONIUM BROMIDE 10 MG/ML (PF) SYRINGE
PREFILLED_SYRINGE | INTRAVENOUS | Status: AC
  Filled 2024-05-01: qty 10

## 2024-05-01 MED ORDER — FENTANYL CITRATE (PF) 250 MCG/5ML IJ SOLN
INTRAMUSCULAR | Status: DC | PRN
  Administered 2024-05-01 (×2): 50 ug via INTRAVENOUS

## 2024-05-01 MED ORDER — CHLORHEXIDINE GLUCONATE 0.12 % MT SOLN: 15.0000 mL | Freq: Once | OROMUCOSAL | Status: AC

## 2024-05-01 MED ORDER — LACTATED RINGERS IV SOLN: INTRAVENOUS | Status: DC

## 2024-05-01 MED ORDER — ACETAMINOPHEN 10 MG/ML IV SOLN
INTRAVENOUS | Status: DC | PRN
  Administered 2024-05-01: 1000 mg via INTRAVENOUS

## 2024-05-01 MED ORDER — SUGAMMADEX SODIUM 200 MG/2ML IV SOLN
INTRAVENOUS | Status: DC | PRN
  Administered 2024-05-01: 200 mg via INTRAVENOUS

## 2024-05-01 MED ORDER — LIDOCAINE 2% (20 MG/ML) 5 ML SYRINGE
INTRAMUSCULAR | Status: AC
  Filled 2024-05-01: qty 5

## 2024-05-01 MED ORDER — OXYCODONE HCL 5 MG PO TABS
ORAL_TABLET | ORAL | Status: AC
  Filled 2024-05-01: qty 1

## 2024-05-01 MED ORDER — OXYCODONE HCL 5 MG/5ML PO SOLN: 5.0000 mg | Freq: Once | ORAL | Status: AC | PRN

## 2024-05-01 MED ORDER — PHENYLEPHRINE 80 MCG/ML (10ML) SYRINGE FOR IV PUSH (FOR BLOOD PRESSURE SUPPORT)
PREFILLED_SYRINGE | INTRAVENOUS | Status: AC
  Filled 2024-05-01: qty 10

## 2024-05-01 MED ORDER — LIDOCAINE 2% (20 MG/ML) 5 ML SYRINGE
INTRAMUSCULAR | Status: DC | PRN
  Administered 2024-05-01: 60 mg via INTRAVENOUS

## 2024-05-01 MED ORDER — DEXAMETHASONE SOD PHOSPHATE PF 10 MG/ML IJ SOLN
INTRAMUSCULAR | Status: AC
  Filled 2024-05-01: qty 1

## 2024-05-01 MED ORDER — ONDANSETRON HCL 4 MG/2ML IJ SOLN
INTRAMUSCULAR | Status: DC | PRN
  Administered 2024-05-01: 4 mg via INTRAVENOUS

## 2024-05-01 MED ORDER — ACETAMINOPHEN 10 MG/ML IV SOLN: 1000.0000 mg | Freq: Once | INTRAVENOUS | Status: DC | PRN

## 2024-05-01 MED ORDER — CEFAZOLIN SODIUM-DEXTROSE 2-4 GM/100ML-% IV SOLN
2.0000 g | INTRAVENOUS | Status: AC
  Administered 2024-05-01: 2 g via INTRAVENOUS

## 2024-05-01 MED ORDER — EPHEDRINE 5 MG/ML INJ
INTRAVENOUS | Status: AC
  Filled 2024-05-01: qty 5

## 2024-05-01 MED ORDER — OXYCODONE HCL 5 MG PO TABS
5.0000 mg | ORAL_TABLET | Freq: Once | ORAL | Status: AC | PRN
  Administered 2024-05-01: 5 mg via ORAL

## 2024-05-01 MED ORDER — ROCURONIUM BROMIDE 10 MG/ML (PF) SYRINGE
PREFILLED_SYRINGE | INTRAVENOUS | Status: DC | PRN
  Administered 2024-05-01: 80 mg via INTRAVENOUS

## 2024-05-01 MED ORDER — 0.9 % SODIUM CHLORIDE (POUR BTL) OPTIME
TOPICAL | Status: DC | PRN
  Administered 2024-05-01: 1000 mL

## 2024-05-01 NOTE — Op Note (Signed)
 05/01/2024  7:30 AM  8:18 AM  PATIENT:  Brandon [name]  74 y.o. male  PRE-OPERATIVE DIAGNOSIS:  RIGHT INGUINAL HERNIA  POST-OPERATIVE DIAGNOSIS:  RIGHT INDIRECT INGUINAL HERNIA  PROCEDURE:  Procedures with comments: REPAIR, HERNIA, INGUINAL, LAPAROSCOPIC WITH MESH (Right) - LAPAROSCOPIC RIGHT INGUINAL HERNIA REPAIR WITH MESH  SURGEON:  Surgeons and Role:    * Rubin Calamity, MD - Primary  ASSISTANTS: none   ANESTHESIA:   local and general  EBL:  minimal   BLOOD ADMINISTERED:none  DRAINS: none   LOCAL MEDICATIONS USED:  MARCAINE      SPECIMEN:  No Specimen  DISPOSITION OF SPECIMEN:  N/A  COUNTS:  YES  TOURNIQUET:  * No tourniquets in log *  DICTATION: .Dragon Dictation Counts: reported as correct x 2  Findings:  The patient had a LARGE right indirect hernia and a moderated sized cord lipoma  Indications for procedure:  The patient is a 74 year old male with a right inguinal hernia for several months. Patient complained of symptomatology to his right inguinal area. The patient was taken back for elective inguinal hernia repair.  Details of the procedure: The patient was taken back to the operating room. The patient was placed in supine position with bilateral SCDs in place.  The patient was prepped and draped in the usual sterile fashion.  After appropriate anitbiotics were confirmed, a time-out was confirmed and all facts were verified.  0.25% Marcaine  was used to infiltrate the umbilical area. A 11-blade was used to cut down the skin and blunt dissection was used to get the anterior fashion.  The anterior fascia was incised approximately 1 cm and the muscles were retracted laterally. Blunt dissection was then used to create a space in the preperitoneal area. At this time a 10 mm camera was then introduced into the space and advanced the pubic tubercle and a 12 mm trocar was placed over this and insufflation was started.  At this time and space was created from medial to  laterally the preperitoneal space.  Cooper's ligament was initially cleaned off.  The hernia sac was identified in the indirect space. Dissection of the hernia sac and cord structures was undertaken the vas deferens was identified and protected in all parts of the case.   Once the hernia sac was taken down to approximately the umbilicus a Bard 3D Max mesh, size: Nickola, was  introduced into the preperitoneal space.  The mesh was brought over to cover the direct and indirect hernia spaces.  This was anchored into place and secured to Cooper's ligament with 4.49mm staples from a Coviden hernia stapler. It was anchored to the anterior abdominal wall with 4.8 mm staples. The hernia sac was seen lying posterior to the mesh. There was no staples placed laterally. The insufflation was evacuated and the peritoneum was seen posterior to the mesh. The trochars were removed. The anterior fascia was reapproximated using #1 Vicryl on a UR- 6.  Intra-abdominal air was evacuated and the Veress needle removed. The skin was reapproximated using 4-0 Monocryl subcuticular fashion and Dermabond. The patient was awakened from general anesthesia and taken to recovery in stable condition.    PLAN OF CARE: Discharge to home after PACU  PATIENT DISPOSITION:  PACU - hemodynamically stable.   Delay start of Pharmacological VTE agent (>24hrs) due to surgical blood loss or risk of bleeding: not applicable

## 2024-05-01 NOTE — Anesthesia Procedure Notes (Signed)
 Procedure Name: Intubation Date/Time: 05/01/2024 7:48 AM  Performed by: Nada Corean CROME, CRNAPre-anesthesia Checklist: Patient identified, Emergency Drugs available, Suction available, Patient being monitored and Timeout performed Patient Re-evaluated:Patient Re-evaluated prior to induction Oxygen Delivery Method: Circle system utilized Preoxygenation: Pre-oxygenation with 100% oxygen Induction Type: IV induction Ventilation: Mask ventilation without difficulty Laryngoscope Size: Glidescope Grade View: Grade I Tube type: Oral Tube size: 7.5 mm Number of attempts: 1 Airway Equipment and Method: Video-laryngoscopy and Stylet Placement Confirmation: ETT inserted through vocal cords under direct vision, positive ETCO2, CO2 detector and breath sounds checked- equal and bilateral Secured at: 22 cm Tube secured with: Tape Dental Injury: Teeth and Oropharynx as per pre-operative assessment

## 2024-05-01 NOTE — Transfer of Care (Signed)
 Immediate Anesthesia Transfer of Care Note  Patient: Brandon [name]  Procedure(s) Performed: REPAIR, HERNIA, INGUINAL, LAPAROSCOPIC WITH MESH (Right)  Patient Location: PACU  Anesthesia Type:General  Level of Consciousness: awake, alert , oriented, and patient cooperative  Airway & Oxygen Therapy: Patient Spontanous Breathing and Patient connected to face mask oxygen  Post-op Assessment: Report given to RN and Post -op Vital signs reviewed and stable  Post vital signs: Reviewed and stable  Last Vitals:  Vitals Value Taken Time  BP 148/75 05/01/24 08:32  Temp    Pulse 55 05/01/24 08:35  Resp 18 05/01/24 08:35  SpO2 100 % 05/01/24 08:35  Vitals shown include unfiled device data.  Last Pain:  Vitals:   05/01/24 0626  TempSrc:   PainSc: 0-No pain         Complications: No notable events documented.

## 2024-05-01 NOTE — Discharge Instructions (Signed)

## 2024-05-01 NOTE — Interval H&P Note (Signed)
 History and Physical Interval Note:  05/01/2024 7:06 AM  Brandon [name]  has presented today for surgery, with the diagnosis of RIGHT INGUINAL HERNIA.  The various methods of treatment have been discussed with the patient and family. After consideration of risks, benefits and other options for treatment, the patient has consented to  Procedures with comments: REPAIR, HERNIA, INGUINAL, LAPAROSCOPIC (Right) - LAPAROSCOPIC RIGHT INGUINAL HERNIA REPAIR WITH MESH as a surgical intervention.  The patient's history has been reviewed, patient examined, no change in status, stable for surgery.  I have reviewed the patient's chart and labs.  Questions were answered to the patient's satisfaction.     [name]

## 2024-05-01 NOTE — Anesthesia Postprocedure Evaluation (Signed)
"   Anesthesia Post Note  Patient: Brandon [name]  Procedure(s) Performed: REPAIR, HERNIA, INGUINAL, LAPAROSCOPIC WITH MESH (Right)     Patient location during evaluation: PACU Anesthesia Type: General Level of consciousness: awake and alert Pain management: pain level controlled Vital Signs Assessment: post-procedure vital signs reviewed and stable Respiratory status: spontaneous breathing, nonlabored ventilation and respiratory function stable Cardiovascular status: blood pressure returned to baseline and stable Postop Assessment: no apparent nausea or vomiting Anesthetic complications: no   No notable events documented.  Last Vitals:  Vitals:   05/01/24 0845 05/01/24 0900  BP: 122/70 131/71  Pulse: (!) 51 (!) 56  Resp: 17 11  Temp:    SpO2: 97% 94%    Last Pain:  Vitals:   05/01/24 0906  TempSrc:   PainSc: 4                  Halli Equihua      "

## 2024-05-02 ENCOUNTER — Encounter (HOSPITAL_COMMUNITY): Payer: Self-pay | Admitting: General Surgery

## 2024-05-04 ENCOUNTER — Other Ambulatory Visit: Payer: Self-pay | Admitting: Neurology

## 2024-05-04 ENCOUNTER — Other Ambulatory Visit: Payer: Self-pay | Admitting: Internal Medicine

## 2024-05-04 DIAGNOSIS — J309 Allergic rhinitis, unspecified: Secondary | ICD-10-CM

## 2024-05-07 ENCOUNTER — Telehealth: Payer: Self-pay | Admitting: Internal Medicine

## 2024-05-07 NOTE — Telephone Encounter (Signed)
-----   Message from Debby Molt, MD sent at 07/10/2023  7:22 AM EDT ----- Regarding: TAA Repeat CT

## 2024-05-08 NOTE — Telephone Encounter (Signed)
 done

## 2024-05-12 ENCOUNTER — Encounter: Payer: Self-pay | Admitting: *Deleted

## 2024-05-12 ENCOUNTER — Encounter: Payer: Self-pay | Admitting: Family Medicine

## 2024-05-12 DIAGNOSIS — F5101 Primary insomnia: Secondary | ICD-10-CM

## 2024-05-12 NOTE — Progress Notes (Signed)
 Brandon [name]                                          MRN: [id number]   05/12/2024   The [facility] Quality Team Specialist reviewed this patient medical record for the purposes of chart review for care gap closure. The following were reviewed: chart review for care gap closure-kidney health evaluation for diabetes:eGFR  and uACR.    [facility]I Quality Team

## 2024-05-13 MED ORDER — ZOLPIDEM TARTRATE 10 MG PO TABS: 10.0000 mg | ORAL_TABLET | Freq: Every evening | ORAL | 1 refills | Status: AC | PRN

## 2024-07-14 ENCOUNTER — Ambulatory Visit: Admitting: Family Medicine

## 2025-02-24 ENCOUNTER — Ambulatory Visit: Admitting: Neurology
